# Patient Record
Sex: Female | Born: 1960
Health system: Southern US, Community
[De-identification: ages and names within clinical notes are randomized; demographics above are authoritative.]

## PROBLEM LIST (undated history)

## (undated) DIAGNOSIS — D509 Iron deficiency anemia, unspecified: Secondary | ICD-10-CM

## (undated) DIAGNOSIS — E785 Hyperlipidemia, unspecified: Secondary | ICD-10-CM

## (undated) DIAGNOSIS — R002 Palpitations: Secondary | ICD-10-CM

## (undated) DIAGNOSIS — C801 Malignant (primary) neoplasm, unspecified: Secondary | ICD-10-CM

## (undated) DIAGNOSIS — K649 Unspecified hemorrhoids: Secondary | ICD-10-CM

## (undated) DIAGNOSIS — N200 Calculus of kidney: Secondary | ICD-10-CM

## (undated) DIAGNOSIS — E119 Type 2 diabetes mellitus without complications: Secondary | ICD-10-CM

## (undated) DIAGNOSIS — K219 Gastro-esophageal reflux disease without esophagitis: Secondary | ICD-10-CM

## (undated) HISTORY — DX: Gastro-esophageal reflux disease without esophagitis: K21.9

## (undated) HISTORY — PX: PARTIAL HYSTERECTOMY: SHX80

## (undated) HISTORY — PX: KIDNEY SURGERY: SHX687

## (undated) HISTORY — PX: BREAST REDUCTION SURGERY: SHX8

## (undated) HISTORY — PX: APPENDECTOMY: SHX54

## (undated) HISTORY — DX: Iron deficiency anemia, unspecified: D50.9

## (undated) HISTORY — PX: CATARACT EXTRACTION, BILATERAL: SHX1313

## (undated) HISTORY — PX: TONSILLECTOMY: SUR1361

## (undated) HISTORY — DX: Hyperlipidemia, unspecified: E78.5

## (undated) HISTORY — DX: Type 2 diabetes mellitus without complications: E11.9

## (undated) HISTORY — DX: Palpitations: R00.2

## (undated) HISTORY — DX: Calculus of kidney: N20.0

## (undated) NOTE — *Deleted (*Deleted)
[facility]   Telephone:([phone] Fax:(336) [phone]   Clinic Follow up Note   Patient Care Team: Rodrigo Ran, MD as PCP - General (Internal Medicine) Pricilla Riffle, MD as PCP - Cardiology (Cardiology) Exie Parody, MD (Hematology and Oncology) Heilingoetter, Johnette Abraham, PA-C as Physician Assistant (Oncology) Malachy Mood, MD as Consulting Physician (Oncology) Radonna Ricker, RN as Oncology Nurse Navigator  Date of Service:  03/21/2020  CHIEF COMPLAINT: F/u ofSquamous cell carcinoma of the anus  SUMMARY OF ONCOLOGIC HISTORY: Oncology History Overview Note  Cancer Staging No matching staging information was found for the patient.    Squamous cell cancer, anus (HCC)  07/13/2019 Procedure    EUA, Excision of posterior internal/external hemorrhoid under the care of Dr. Daphine Deutscher    07/13/2019 Pathology Results   - Invasive moderately differentiated squamous cell carcinoma, 1.4 cm.  See comment  - Carcinoma invades for depth of 0.4 cm  - Deep resection margin is negative for carcinoma (0.2 cm)  - Lateral mucosal margin is positive for high-grade dysplasia  - No evidence of lymphovascular perineural invasion  Procedure: Local excision  Tumor Site: Anal canal  Tumor Size: 1.4 cm  Histologic Type: Invasive squamous cell carcinoma  Histologic Grade: G2: Moderately differentiated  Tumor Extension: Carcinoma invades superficial anal sphincter muscle  Margins: Uninvolved by tumor  Treatment Effect: N/A  Regional Lymph Nodes: No lymph nodes submitted or found  Pathologic Stage Classification (pTNM, AJCC 8th Edition):  pT1, pNx  Representative Tumor Block: A1  Comment(s): Lateral mucosal margin is positive for high-grade squamous  dysplasia      07/13/2019 Initial Diagnosis   Squamous cell cancer, anus (HCC)   09/04/2019 Imaging   CT scan Chest, Abdomen, and Pelvis   IMPRESSION: 1. No evidence of metastatic disease in the chest, abdomen or pelvis. 2. No  discrete anorectal mass. 3. Chronic findings include: Punctate nonobstructing upper right renal stones and chronic right renal scarring. 4. Aortic Atherosclerosis (ICD10-I70.0).   01/10/2020 Pathology Results   A. ANAL LESION, POSTERIOR MIDLINE, EXCISION:  - Squamous cell carcinoma, moderately differentiated.  Verbally reported by Dr. Luisa Hart 1.5 cm, negative margins, depth <1 mm   01/11/2020 Procedure   1. Excision of anal canal lesion (posterior midline) under the care of Dr. Cliffton Asters     01/28/2020 PET scan   IMPRESSION: 1. Mild focal anal hypermetabolism without discrete mass correlate on the CT images, nonspecific, differential includes postsurgical change or residual anal tumor. 2. No hypermetabolic locoregional or distant metastatic disease. 3. Chronic findings include: Aortic Atherosclerosis (ICD10-I70.0). Diffuse hepatic steatosis. Nonobstructing right nephrolithiasis.     02/04/2020 - 03/13/2020 Radiation Therapy   concurrent chemoRT by Dr Mitzi Hansen with Mitomycin and 5FU starting 02/04/20-03/13/20   02/04/2020 - 03/03/2020 Chemotherapy   Concurrent chemoRT with Mitomycin and 5FU on week 1 and 5 starting 02/04/20-03/03/20      CURRENT THERAPY:  ***  INTERVAL HISTORY: *** Melinda [name] is here for a follow up. She presents to the clinic alone.    REVIEW OF SYSTEMS:  *** Constitutional: Denies fevers, chills or abnormal weight loss Eyes: Denies blurriness of vision Ears, nose, mouth, throat, and face: Denies mucositis or sore throat Respiratory: Denies cough, dyspnea or wheezes Cardiovascular: Denies palpitation, chest discomfort or lower extremity swelling Gastrointestinal:  Denies nausea, heartburn or change in bowel habits Skin: Denies abnormal skin rashes Lymphatics: Denies new lymphadenopathy or easy bruising Neurological:Denies numbness, tingling or new weaknesses Behavioral/Psych: Mood is stable, no new changes  All other systems were reviewed with the patient and  are negative.  MEDICAL HISTORY:  Past Medical History:  Diagnosis Date  . Diabetes mellitus type 2, controlled (HCC)   . Dyslipidemia    untreated  . GERD (gastroesophageal reflux disease)   . Hemorrhoids   . Microcytic anemia 06/22/2012  . Nephrolith   . Palpitation    positive tilt table test. Prozac and Inderal treatment effective.    SURGICAL HISTORY: Past Surgical History:  Procedure Laterality Date  . APPENDECTOMY    . BREAST REDUCTION SURGERY    . CARDIAC CATHETERIZATION  2002   normal LV function and no significant coronary obstruction  . CATARACT EXTRACTION, BILATERAL Bilateral november 2020 and dcember 2020  . CYST EXCISION N/A 01/11/2020   Procedure: EXCISION OF ANAL CANAL MASS;  Surgeon: Andria Meuse, MD;  Location: [facility];  Service: General;  Laterality: N/A;  . ESOPHAGEAL MANOMETRY N/A 04/04/2017   Procedure: ESOPHAGEAL MANOMETRY (EM);  Surgeon: Carman Ching, MD;  Location: [facility];  Service: Endoscopy;  Laterality: N/A;  . HEMORRHOID SURGERY N/A 07/13/2019   Procedure: HEMORRHOIDECTOMY;  Surgeon: Luretha Murphy, MD;  Location: [facility];  Service: General;  Laterality: N/A;  . KIDNEY SURGERY     kidney stones   . PARTIAL HYSTERECTOMY    . RECTAL EXAM UNDER ANESTHESIA N/A 01/11/2020   Procedure: RECTAL EXAM UNDER ANESTHESIA;  Surgeon: Andria Meuse, MD;  Location: [facility];  Service: General;  Laterality: N/A;  . TONSILLECTOMY      I have reviewed the social history and family history with the patient and they are unchanged from previous note.  ALLERGIES:  is allergic to cephalexin, amoxapine and related, amoxicillin-pot clavulanate, atorvastatin, cefaclor, ciprofloxacin, codeine, doxycycline calcium, fenofibrate micronized, fish oil, flagyl [metronidazole hcl], levofloxacin in d5w, metronidazole, ofloxacin, prednisolone, promethazine hcl, tape, tetracycline hcl, erythromycin, and nitrofurantoin.  MEDICATIONS:  Current Outpatient  Medications  Medication Sig Dispense Refill  . clorazepate (TRANXENE) 7.5 MG tablet Take 7.5 mg by mouth 2 (two) times daily.  1  . CRESTOR 5 MG tablet Take 5 mg by mouth every evening.   1  . diphenoxylate-atropine (LOMOTIL) 2.5-0.025 MG tablet Take 1-2 tablets by mouth 4 (four) times daily as needed for diarrhea or loose stools. (Patient not taking: Reported on 02/25/2020) 60 tablet 1  . esomeprazole (NEXIUM) 40 MG capsule Take 40 mg by mouth daily at 12 noon.    . feeding supplement (ENSURE ENLIVE / ENSURE PLUS) LIQD Take 237 mLs by mouth daily. 237 mL 12  . FLUoxetine (PROZAC) 20 MG capsule Take 20 mg by mouth every morning.    . hyoscyamine (ANASPAZ) 0.125 MG TBDP disintergrating tablet Place 1 tablet (0.125 mg total) under the tongue every 6 (six) hours as needed. (Patient not taking: Reported on 03/13/2020) 30 tablet 2  . Loperamide HCl (IMODIUM PO) Take 2 capsules by mouth every 6 (six) hours as needed (loose stool).     . metFORMIN (GLUCOPHAGE) 500 MG tablet Take 500 mg by mouth 2 (two) times daily with a meal.     . methenamine (HIPREX) 1 g tablet Take 1 g by mouth daily.     [name] NU-IRON 150 MG capsule Take 150 mg by mouth daily.   2  . ondansetron (ZOFRAN) 8 MG tablet Take 1 tablet (8 mg total) by mouth 2 (two) times daily as needed (Nausea or vomiting). (Patient taking differently: Take 8 mg by mouth 2 (two) times daily as needed for  nausea or vomiting. ) 30 tablet 1  . oxyCODONE (OXY IR/ROXICODONE) 5 MG immediate release tablet Take 1 tablet (5 mg total) by mouth every 4 (four) hours as needed for severe pain. 30 tablet 0  . prochlorperazine (COMPAZINE) 10 MG tablet Take 1 tablet (10 mg total) by mouth every 6 (six) hours as needed (Nausea or vomiting). (Patient taking differently: Take 10 mg by mouth every 6 (six) hours as needed for nausea. ) 30 tablet 1  . propranolol (INDERAL) 10 MG tablet TAKE 1 TABLET BY MOUTH TWICE A DAY (Patient taking differently: Take 10 mg by mouth 2 (two) times  daily. ) 180 tablet 2  . protein supplement (RESOURCE BENEPROTEIN) 6 g POWD Take 1 Scoop (6 g total) by mouth 3 (three) times daily with meals. 50 packet 0  . Pumpkin Seed-Soy Germ (AZO BLADDER CONTROL/GO-LESS PO) Take 1 tablet by mouth daily as needed (pain & irritation).    . silver sulfADIAZINE (SILVADENE) 1 % cream Apply 1 application topically daily. 400 g 0   No current facility-administered medications for this visit.    PHYSICAL EXAMINATION: ECOG PERFORMANCE STATUS: {CHL ONC ECOG PS:[id number]}  There were no vitals filed for this visit. There were no vitals filed for this visit. *** GENERAL:alert, no distress and comfortable SKIN: skin color, texture, turgor are normal, no rashes or significant lesions EYES: normal, Conjunctiva are pink and non-injected, sclera clear {OROPHARYNX:no exudate, no erythema and lips, buccal mucosa, and tongue normal}  NECK: supple, thyroid normal size, non-tender, without nodularity LYMPH:  no palpable lymphadenopathy in the cervical, axillary {or inguinal} LUNGS: clear to auscultation and percussion with normal breathing effort HEART: regular rate & rhythm and no murmurs and no lower extremity edema ABDOMEN:abdomen soft, non-tender and normal bowel sounds Musculoskeletal:no cyanosis of digits and no clubbing  NEURO: alert & oriented x 3 with fluent speech, no focal motor/sensory deficits  LABORATORY DATA:  I have reviewed the data as listed CBC Latest Ref Rng & Units 03/20/2020 03/19/2020 03/18/2020  WBC 4.0 - 10.5 K/uL 2.3(L) 2.3(L) 1.9(L)  Hemoglobin 12.0 - 15.0 g/dL 10.0(L) 10.5(L) 10.6(L)  Hematocrit 36 - 46 % 31.7(L) 33.5(L) 33.7(L)  Platelets 150 - 400 K/uL 136(L) 130(L) 123(L)     CMP Latest Ref Rng & Units 03/20/2020 03/19/2020 03/18/2020  Glucose 70 - 99 mg/dL 98 93 161(W)  BUN 8 - 23 mg/dL 5(L) <9(U) 6(L)  Creatinine 0.44 - 1.00 mg/dL 0.45 4.09 8.11  Sodium 135 - 145 mmol/L 137 135 140  Potassium 3.5 - 5.1 mmol/L 3.8 4.2 3.3(L)   Chloride 98 - 111 mmol/L 108 106 108  CO2 22 - 32 mmol/L 22 23 21(L)  Calcium 8.9 - 10.3 mg/dL 8.3(L) 8.4(L) 8.5(L)  Total Protein 6.5 - 8.1 g/dL - 5.5(L) 5.5(L)  Total Bilirubin 0.3 - 1.2 mg/dL - 0.3 0.5  Alkaline Phos 38 - 126 U/L - 43 41  AST 15 - 41 U/L - 11(L) 12(L)  ALT 0 - 44 U/L - 11 13      RADIOGRAPHIC STUDIES: I have personally reviewed the radiological images as listed and agreed with the findings in the report. No results found.   ASSESSMENT & PLAN:  Melinda [name] is a 59 y.o. female with    1.Recurrent squamous cell carcinoma the anus, pT1N0M0 -She initially presented to Dr Loraine Maple hemorrhoidectomyand [facility] 2/5/21withinvasive moderately differentiated squamous cell carcinoma, 1.4 cm,pT1, pNx. -She had local recurrence andunderwent surgical excision under the care of Dr. Cliffton Asters on 01/11/20. Surgical pathwas  consistent with squamous cell carcinoma of the anus.01/28/11 staging PETshowed nonode ordistant metastasis. -She was able to complete 5/6 weeks of the standard concurrent chemoRT with Mitomycin and5FU on due to poor tolerance of local irritation, infection, N/V/D. Due to failure to thrive at home and in outpatient I admitted her to Hospital on 03/13/20.  ***   2. Diarrhea, Nausea,Radiation dermatitiswith pain, Enteritis/colitis and UTI, radiation-induced  -She is able to tolerate oral intake adequately, continue follow-up with dietitian -S/p week 5 treatment her skin irration worsened with severe dysuria and significant skin itching. On 03/10/20 exam she has no true breakdown, but mild discharge. She has internal hemorrhoids which contributes to her BM pain.  -She notes tramadol did not help. I started her on oxycodone 5mg  (03/10/20) -03/10/20 Urine culture showed E. coli, pansensitive. She tried treatment with Macrobid, but she was not able to tolerate it due to nausea. She was hospitalized on 03/13/20 and treated with IV antibiotics as inpatient.    3.Iron deficiency anemia -Etiology unclear as to her iron deficiency. S/Physterectomy -Currently managed by PCP, Dr. Waynard Edwards. She receives iron infusions with Feraheme PRN, last in9/3/21 -Mild, stable.  4. Diabetes Mellitis  -Continue to f/u with PCP for management.  5. Neutropenia, Thrombocytopenia -Secondary to chemo, overall mild   6. Hyponatremia -Potassium 3.0 on 03/13/20 , will give IV 20 mEq today   PLAN: ***     No problem-specific Assessment & Plan notes found for this encounter.   No orders of the defined types were placed in this encounter.  All questions were answered. The patient knows to call the clinic with any problems, questions or concerns. No barriers to learning was detected. The total time spent in the appointment was {CHL ONC TIME VISIT - RUEAV:[id number]}.     Delphina Cahill 03/21/2020   Rogelia Rohrer, am acting as scribe for Malachy Mood, MD.   {Add scribe attestation statement}

## (undated) NOTE — *Deleted (*Deleted)
[facility]   Telephone:(336) [phone] Fax:(336) [phone]   Clinic Follow up Note   Patient Care Team: Rodrigo Ran, MD as PCP - General (Internal Medicine) Pricilla Riffle, MD as PCP - Cardiology (Cardiology) Exie Parody, MD (Hematology and Oncology) Heilingoetter, Johnette Abraham, PA-C as Physician Assistant (Oncology) Malachy Mood, MD as Consulting Physician (Oncology) Radonna Ricker, RN as Oncology Nurse Navigator  Date of Service:  04/04/2020  CHIEF COMPLAINT: ***  SUMMARY OF ONCOLOGIC HISTORY: Oncology History Overview Note  Cancer Staging No matching staging information was found for the patient.    Squamous cell cancer, anus (HCC)  07/13/2019 Procedure    EUA, Excision of posterior internal/external hemorrhoid under the care of Dr. Daphine Deutscher    07/13/2019 Pathology Results   - Invasive moderately differentiated squamous cell carcinoma, 1.4 cm.  See comment  - Carcinoma invades for depth of 0.4 cm  - Deep resection margin is negative for carcinoma (0.2 cm)  - Lateral mucosal margin is positive for high-grade dysplasia  - No evidence of lymphovascular perineural invasion  Procedure: Local excision  Tumor Site: Anal canal  Tumor Size: 1.4 cm  Histologic Type: Invasive squamous cell carcinoma  Histologic Grade: G2: Moderately differentiated  Tumor Extension: Carcinoma invades superficial anal sphincter muscle  Margins: Uninvolved by tumor  Treatment Effect: N/A  Regional Lymph Nodes: No lymph nodes submitted or found  Pathologic Stage Classification (pTNM, AJCC 8th Edition):  pT1, pNx  Representative Tumor Block: A1  Comment(s): Lateral mucosal margin is positive for high-grade squamous  dysplasia      07/13/2019 Initial Diagnosis   Squamous cell cancer, anus (HCC)   09/04/2019 Imaging   CT scan Chest, Abdomen, and Pelvis   IMPRESSION: 1. No evidence of metastatic disease in the chest, abdomen or pelvis. 2. No discrete anorectal mass. 3. Chronic findings  include: Punctate nonobstructing upper right renal stones and chronic right renal scarring. 4. Aortic Atherosclerosis (ICD10-I70.0).   01/10/2020 Pathology Results   A. ANAL LESION, POSTERIOR MIDLINE, EXCISION:  - Squamous cell carcinoma, moderately differentiated.  Verbally reported by Dr. Luisa Hart 1.5 cm, negative margins, depth <1 mm   01/11/2020 Procedure   1. Excision of anal canal lesion (posterior midline) under the care of Dr. Cliffton Asters     01/28/2020 PET scan   IMPRESSION: 1. Mild focal anal hypermetabolism without discrete mass correlate on the CT images, nonspecific, differential includes postsurgical change or residual anal tumor. 2. No hypermetabolic locoregional or distant metastatic disease. 3. Chronic findings include: Aortic Atherosclerosis (ICD10-I70.0). Diffuse hepatic steatosis. Nonobstructing right nephrolithiasis.     02/04/2020 - 03/07/2020 Radiation Therapy   concurrent chemoRT by Dr Mitzi Hansen with Mitomycin and 5FU starting 02/04/20-03/07/20. The last 4 session cancelled due to poor toleration.    02/04/2020 - 03/03/2020 Chemotherapy   Concurrent chemoRT with Mitomycin and 5FU on week 1 and 5 starting 02/04/20-03/03/20   03/13/2020 Imaging   CT AP W contrast  IMPRESSION: Diffuse wall thickening with inflammatory changes involving a long segment of distal ileum, cecum, and sigmoid colon likely represents enteritis/colitis. This could be due to infectious, or inflammatory nature, not thought to represent ischemic colitis.   Hepatic steatosis   Trace pericardial effusion   Small free fluid in the deep pelvis      CURRENT THERAPY:  ***  INTERVAL HISTORY: *** Melinda [name] is here for a follow up ***  REVIEW OF SYSTEMS:  *** Constitutional: Denies fevers, chills or abnormal weight loss Eyes: Denies blurriness of vision  Ears, nose, mouth, throat, and face: Denies mucositis or sore throat Respiratory: Denies cough, dyspnea or wheezes Cardiovascular: Denies  palpitation, chest discomfort or lower extremity swelling Gastrointestinal:  Denies nausea, heartburn or change in bowel habits Skin: Denies abnormal skin rashes Lymphatics: Denies new lymphadenopathy or easy bruising Neurological:Denies numbness, tingling or new weaknesses Behavioral/Psych: Mood is stable, no new changes  All other systems were reviewed with the patient and are negative.  MEDICAL HISTORY:  Past Medical History:  Diagnosis Date  . anal ca dx'd 07/2019  . Diabetes mellitus type 2, controlled (HCC)   . Dyslipidemia    untreated  . GERD (gastroesophageal reflux disease)   . Hemorrhoids   . Microcytic anemia 06/22/2012  . Nephrolith   . Palpitation    positive tilt table test. Prozac and Inderal treatment effective.    SURGICAL HISTORY: Past Surgical History:  Procedure Laterality Date  . APPENDECTOMY    . BREAST REDUCTION SURGERY    . CARDIAC CATHETERIZATION  2002   normal LV function and no significant coronary obstruction  . CATARACT EXTRACTION, BILATERAL Bilateral november 2020 and dcember 2020  . CYST EXCISION N/A 01/11/2020   Procedure: EXCISION OF ANAL CANAL MASS;  Surgeon: Andria Meuse, MD;  Location: [facility];  Service: General;  Laterality: N/A;  . CYSTOSCOPY/URETEROSCOPY/HOLMIUM LASER/STENT PLACEMENT Right 03/26/2020   Procedure: CYSTOSCOPY/URETEROSCOPY/HOLMIUM LASER/STENT PLACEMENT;  Surgeon: Crist Fat, MD;  Location: [facility];  Service: Urology;  Laterality: Right;  . ESOPHAGEAL MANOMETRY N/A 04/04/2017   Procedure: ESOPHAGEAL MANOMETRY (EM);  Surgeon: Carman Ching, MD;  Location: [facility];  Service: Endoscopy;  Laterality: N/A;  . HEMORRHOID SURGERY N/A 07/13/2019   Procedure: HEMORRHOIDECTOMY;  Surgeon: Luretha Murphy, MD;  Location: [facility];  Service: General;  Laterality: N/A;  . KIDNEY SURGERY     kidney stones   . PARTIAL HYSTERECTOMY    . RECTAL EXAM UNDER ANESTHESIA N/A 01/11/2020   Procedure: RECTAL EXAM  UNDER ANESTHESIA;  Surgeon: Andria Meuse, MD;  Location: [facility];  Service: General;  Laterality: N/A;  . TONSILLECTOMY      I have reviewed the social history and family history with the patient and they are unchanged from previous note.  ALLERGIES:  is allergic to cephalexin, amoxapine and related, amoxicillin-pot clavulanate, atorvastatin, cefaclor, ciprofloxacin, codeine, doxycycline calcium, fenofibrate micronized, fish oil, flagyl [metronidazole hcl], levofloxacin in d5w, metronidazole, ofloxacin, prednisolone, promethazine hcl, tape, tetracycline hcl, erythromycin, and nitrofurantoin.  MEDICATIONS:  No current facility-administered medications for this visit.   No current outpatient medications on file.   Facility-Administered Medications Ordered in Other Visits  Medication Dose Route Frequency Provider Last Rate Last Admin  . 0.9 %  sodium chloride infusion   Intravenous Continuous Marikay Alar, FNP 50 mL/hr at 04/04/20 1610 Rate Verify at 04/04/20 0639  . acetaminophen (TYLENOL) tablet 650 mg  650 mg Oral Q6H PRN Lewie Chamber, MD   650 mg at 04/02/20 2122   Or  . acetaminophen (TYLENOL) suppository 650 mg  650 mg Rectal Q6H PRN Lewie Chamber, MD      . clorazepate (TRANXENE) tablet 7.5 mg  7.5 mg Oral BID Lewie Chamber, MD   7.5 mg at 04/04/20 0933  . FLUoxetine (PROZAC) capsule 20 mg  20 mg Oral q morning - 10a Lewie Chamber, MD   20 mg at 04/04/20 0934  . insulin aspart (novoLOG) injection 0-20 Units  0-20 Units Subcutaneous Q4H Lewie Chamber, MD   4 Units at 04/04/20 0935  . methylPREDNISolone  sodium succinate (SOLU-MEDROL) 125 mg/2 mL injection 60 mg  60 mg Intravenous Eliezer Lofts, MD   60 mg at 04/04/20 0533  . morphine 2 MG/ML injection 2 mg  2 mg Intravenous Q2H PRN Lewie Chamber, MD      . ondansetron [facility]) tablet 4 mg  4 mg Oral Q6H PRN Lewie Chamber, MD       Or  . ondansetron [facility]) injection 4 mg  4 mg Intravenous Q6H PRN Lewie Chamber, MD       . phenazopyridine (PYRIDIUM) tablet 100 mg  100 mg Oral TID PRN Lewie Chamber, MD   100 mg at 04/03/20 2112  . propranolol (INDERAL) tablet 10 mg  10 mg Oral BID Lewie Chamber, MD   10 mg at 04/04/20 0934  . rosuvastatin (CRESTOR) tablet 5 mg  5 mg Oral Daily Marikay Alar, FNP   5 mg at 04/03/20 2247  . sodium chloride flush (NS) 0.9 % injection 3 mL  3 mL Intravenous Orlene Erm, MD   3 mL at 04/03/20 2112    PHYSICAL EXAMINATION: ECOG PERFORMANCE STATUS: {CHL ONC ECOG PS:[id number]}  There were no vitals filed for this visit. There were no vitals filed for this visit. *** GENERAL:alert, no distress and comfortable SKIN: skin color, texture, turgor are normal, no rashes or significant lesions EYES: normal, Conjunctiva are pink and non-injected, sclera clear {OROPHARYNX:no exudate, no erythema and lips, buccal mucosa, and tongue normal}  NECK: supple, thyroid normal size, non-tender, without nodularity LYMPH:  no palpable lymphadenopathy in the cervical, axillary {or inguinal} LUNGS: clear to auscultation and percussion with normal breathing effort HEART: regular rate & rhythm and no murmurs and no lower extremity edema ABDOMEN:abdomen soft, non-tender and normal bowel sounds Musculoskeletal:no cyanosis of digits and no clubbing  NEURO: alert & oriented x 3 with fluent speech, no focal motor/sensory deficits  LABORATORY DATA:  I have reviewed the data as listed CBC Latest Ref Rng & Units 04/04/2020 04/03/2020 04/02/2020  WBC 4.0 - 10.5 K/uL 3.8(L) 2.9(L) 6.6  Hemoglobin 12.0 - 15.0 g/dL 10.1(L) 10.0(L) 12.7  Hematocrit 36 - 46 % 31.4(L) 31.4(L) 38.6  Platelets 150 - 400 K/uL 170 154 205     CMP Latest Ref Rng & Units 04/04/2020 04/03/2020 04/02/2020  Glucose 70 - 99 mg/dL 865(H) 846(N) 629(B)  BUN 8 - 23 mg/dL 14 16 28(U)  Creatinine 0.44 - 1.00 mg/dL 1.32 4.40 1.02  Sodium 135 - 145 mmol/L 137 137 139  Potassium 3.5 - 5.1 mmol/L 3.7 3.7 3.1(L)  Chloride 98 -  111 mmol/L 106 105 101  CO2 22 - 32 mmol/L 23 24 25   Calcium 8.9 - 10.3 mg/dL 7.2(Z) 3.6(U) 9.1  Total Protein 6.5 - 8.1 g/dL - - -  Total Bilirubin 0.3 - 1.2 mg/dL - - -  Alkaline Phos 38 - 126 U/L - - -  AST 15 - 41 U/L - - -  ALT 0 - 44 U/L - - -      RADIOGRAPHIC STUDIES: I have personally reviewed the radiological images as listed and agreed with the findings in the report. CT ABDOMEN PELVIS W CONTRAST  Result Date: 04/02/2020 CLINICAL DATA:  Lower abdominal pain for almost 2 months with worsening since last night. Nausea, vomiting and diarrhea. 25 pound weight loss. History of anal cancer status post radiation therapy in February 2021 with completion of chemotherapy 03/07/2020. EXAM: CT ABDOMEN AND PELVIS WITH CONTRAST TECHNIQUE: Multidetector CT imaging of the abdomen and pelvis was performed  using the standard protocol following bolus administration of intravenous contrast. CONTRAST:  OMNIPAQUE IOHEXOL 300 MG/ML  SOLN COMPARISON:  03/28/2020 CT abdomen/pelvis. FINDINGS: Lower chest: Posterior right lower lobe 3 mm solid pulmonary nodule (series 6/image 11), unchanged from 03/28/2020 CT abdomen study. Hepatobiliary: Normal liver size. No liver mass. Normal gallbladder with no radiopaque cholelithiasis. No biliary ductal dilatation. Pancreas: Normal, with no mass or duct dilation. Spleen: Normal size. No mass. Adrenals/Urinary Tract: Normal adrenals. Moderate right hydroureteronephrosis to the level of distal right pelvic ureter, similar to slightly worsened since 03/28/2020 CT. Urothelial wall thickening and hyperenhancement in the distal 2-3 cm of the right pelvic ureter, similar. No left hydronephrosis. Normal caliber left ureter. Moderate renal cortical scarring throughout the upper right kidney is unchanged. Subcentimeter hypodense renal cortical lesion in the anterior interpolar right kidney is too small to characterize and unchanged. No new renal lesions. Tiny focus of gas in the  nondependent bladder. No definite bladder wall thickening. Small calcification in the anterior midline bladder wall is unchanged. Stomach/Bowel: Small hiatal hernia. Otherwise normal nondistended stomach. Long segment of moderate contiguous wall thickening in the distal and terminal ileum with associated mucosal hyperenhancement and surrounding mesenteric fat stranding and ill-defined fluid, similar to slightly improved in the interval. No new sites of small bowel wall thickening. No small bowel dilatation. Appendectomy. No definite large bowel wall thickening. No colonic diverticulosis. No acute pericolonic fat stranding. Large bowel is largely collapsed. Vascular/Lymphatic: Atherosclerotic nonaneurysmal abdominal aorta. Patent portal, splenic, hepatic and renal veins. Retroaortic left renal vein. No pathologically enlarged lymph nodes in the abdomen or pelvis. Reproductive: Status post hysterectomy, with no mass at the vaginal cuff. No adnexal mass. Other: No pneumoperitoneum. No focal fluid collections. Small volume free fluid in the pelvic cul-de-sac is similar. Musculoskeletal: No aggressive appearing focal osseous lesions. Moderate thoracolumbar spondylosis. IMPRESSION: 1. CT study is fairly similar to the CT study performed 5 days prior. 2. Nonspecific distal and terminal ileitis, potentially treatment related, similar to slightly improved. No bowel obstruction. No free air. 3. Moderate right hydroureteronephrosis to the level of the distal right pelvic ureter, similar to slightly worsened. Nonspecific urothelial wall thickening and hyperenhancement in the distal 2-3 cm of the right pelvic ureter, similar, probably inflammatory. Consider urology consultation. 4. Tiny focus of gas in the nondependent bladder, presumably due to recent bladder instrumentation. Correlate with urinalysis as clinically warranted to exclude acute cystitis. 5. Stable small volume free fluid in the pelvic cul-de-sac. 6. No  lymphadenopathy or other findings suspicious for metastatic disease in the abdomen or pelvis. Right lung base 3 mm solid pulmonary nodule is stable and warrants continued chest CT follow-up. 7. Small hiatal hernia. 8. Aortic Atherosclerosis (ICD10-I70.0). Electronically Signed   By: Delbert Phenix M.D.   On: 04/02/2020 17:21     ASSESSMENT & PLAN:  RYLANN MUNFORD is a 1 y.o. female with      No problem-specific Assessment & Plan notes found for this encounter.   No orders of the defined types were placed in this encounter.  All questions were answered. The patient knows to call the clinic with any problems, questions or concerns. No barriers to learning was detected. The total time spent in the appointment was {CHL ONC TIME VISIT - ZOXWR:[id number]}.     Melinda [name] 04/04/2020   Rogelia Rohrer, am acting as scribe for Malachy Mood, MD.   {Add scribe attestation statement}

---

## 1998-01-07 ENCOUNTER — Inpatient Hospital Stay (HOSPITAL_COMMUNITY): Admission: AD | Admit: 1998-01-07 | Discharge: 1998-01-07 | Payer: Self-pay | Admitting: Family Medicine

## 1998-01-11 ENCOUNTER — Ambulatory Visit (HOSPITAL_COMMUNITY): Admission: RE | Admit: 1998-01-11 | Discharge: 1998-01-11 | Payer: Self-pay | Admitting: *Deleted

## 1998-01-11 ENCOUNTER — Inpatient Hospital Stay (HOSPITAL_COMMUNITY): Admission: AD | Admit: 1998-01-11 | Discharge: 1998-01-11 | Payer: Self-pay | Admitting: *Deleted

## 1998-01-12 ENCOUNTER — Inpatient Hospital Stay (HOSPITAL_COMMUNITY): Admission: AD | Admit: 1998-01-12 | Discharge: 1998-01-12 | Payer: Self-pay | Admitting: Obstetrics and Gynecology

## 1998-01-15 ENCOUNTER — Ambulatory Visit (HOSPITAL_COMMUNITY): Admission: RE | Admit: 1998-01-15 | Discharge: 1998-01-15 | Payer: Self-pay | Admitting: Obstetrics and Gynecology

## 1998-01-20 ENCOUNTER — Ambulatory Visit (HOSPITAL_COMMUNITY): Admission: RE | Admit: 1998-01-20 | Discharge: 1998-01-20 | Payer: Self-pay

## 1998-02-07 ENCOUNTER — Encounter: Payer: Self-pay | Admitting: Emergency Medicine

## 1998-02-07 ENCOUNTER — Observation Stay (HOSPITAL_COMMUNITY): Admission: EM | Admit: 1998-02-07 | Discharge: 1998-02-08 | Payer: Self-pay | Admitting: Emergency Medicine

## 1998-07-24 ENCOUNTER — Inpatient Hospital Stay (HOSPITAL_COMMUNITY): Admission: RE | Admit: 1998-07-24 | Discharge: 1998-07-25 | Payer: Self-pay | Admitting: Gynecology

## 1998-08-13 ENCOUNTER — Observation Stay (HOSPITAL_COMMUNITY): Admission: AD | Admit: 1998-08-13 | Discharge: 1998-08-14 | Payer: Self-pay | Admitting: Obstetrics and Gynecology

## 1998-08-18 ENCOUNTER — Emergency Department (HOSPITAL_COMMUNITY): Admission: EM | Admit: 1998-08-18 | Discharge: 1998-08-18 | Payer: Self-pay | Admitting: Emergency Medicine

## 1998-12-02 ENCOUNTER — Emergency Department (HOSPITAL_COMMUNITY): Admission: EM | Admit: 1998-12-02 | Discharge: 1998-12-02 | Payer: Self-pay

## 1999-01-30 ENCOUNTER — Encounter: Payer: Self-pay | Admitting: Family Medicine

## 1999-01-30 ENCOUNTER — Ambulatory Visit (HOSPITAL_COMMUNITY): Admission: RE | Admit: 1999-01-30 | Discharge: 1999-01-30 | Payer: Self-pay | Admitting: Family Medicine

## 1999-10-15 ENCOUNTER — Encounter (INDEPENDENT_AMBULATORY_CARE_PROVIDER_SITE_OTHER): Payer: Self-pay | Admitting: Specialist

## 1999-10-16 ENCOUNTER — Encounter: Payer: Self-pay | Admitting: General Surgery

## 1999-10-16 ENCOUNTER — Inpatient Hospital Stay (HOSPITAL_COMMUNITY): Admission: EM | Admit: 1999-10-16 | Discharge: 1999-10-18 | Payer: Self-pay | Admitting: Emergency Medicine

## 1999-10-16 ENCOUNTER — Encounter: Payer: Self-pay | Admitting: Emergency Medicine

## 1999-10-29 ENCOUNTER — Other Ambulatory Visit: Admission: RE | Admit: 1999-10-29 | Discharge: 1999-10-29 | Payer: Self-pay | Admitting: Gynecology

## 2000-03-20 ENCOUNTER — Encounter: Payer: Self-pay | Admitting: Emergency Medicine

## 2000-03-20 ENCOUNTER — Emergency Department (HOSPITAL_COMMUNITY): Admission: EM | Admit: 2000-03-20 | Discharge: 2000-03-20 | Payer: Self-pay | Admitting: Emergency Medicine

## 2000-06-07 HISTORY — PX: CARDIAC CATHETERIZATION: SHX172

## 2000-07-18 ENCOUNTER — Ambulatory Visit (HOSPITAL_COMMUNITY): Admission: RE | Admit: 2000-07-18 | Discharge: 2000-07-18 | Payer: Self-pay | Admitting: Family Medicine

## 2000-07-18 ENCOUNTER — Encounter: Payer: Self-pay | Admitting: Family Medicine

## 2000-08-29 ENCOUNTER — Ambulatory Visit (HOSPITAL_COMMUNITY): Admission: RE | Admit: 2000-08-29 | Discharge: 2000-08-29 | Payer: Self-pay | Admitting: Family Medicine

## 2000-08-29 ENCOUNTER — Encounter: Payer: Self-pay | Admitting: Family Medicine

## 2001-02-24 ENCOUNTER — Encounter: Payer: Self-pay | Admitting: Cardiology

## 2001-02-24 ENCOUNTER — Ambulatory Visit (HOSPITAL_COMMUNITY): Admission: RE | Admit: 2001-02-24 | Discharge: 2001-02-24 | Payer: Self-pay | Admitting: Cardiology

## 2001-07-06 ENCOUNTER — Ambulatory Visit (HOSPITAL_COMMUNITY): Admission: RE | Admit: 2001-07-06 | Discharge: 2001-07-06 | Payer: Self-pay | Admitting: Family Medicine

## 2001-07-06 ENCOUNTER — Encounter: Payer: Self-pay | Admitting: Family Medicine

## 2002-04-05 ENCOUNTER — Encounter: Payer: Self-pay | Admitting: Family Medicine

## 2002-04-05 ENCOUNTER — Ambulatory Visit (HOSPITAL_COMMUNITY): Admission: RE | Admit: 2002-04-05 | Discharge: 2002-04-05 | Payer: Self-pay | Admitting: Family Medicine

## 2002-04-09 ENCOUNTER — Encounter: Admission: RE | Admit: 2002-04-09 | Discharge: 2002-04-09 | Payer: Self-pay | Admitting: Gastroenterology

## 2002-04-09 ENCOUNTER — Encounter: Payer: Self-pay | Admitting: Gastroenterology

## 2002-04-26 ENCOUNTER — Ambulatory Visit (HOSPITAL_COMMUNITY): Admission: RE | Admit: 2002-04-26 | Discharge: 2002-04-26 | Payer: Self-pay | Admitting: Gastroenterology

## 2002-04-26 ENCOUNTER — Encounter: Payer: Self-pay | Admitting: Gastroenterology

## 2002-05-08 ENCOUNTER — Ambulatory Visit (HOSPITAL_COMMUNITY): Admission: RE | Admit: 2002-05-08 | Discharge: 2002-05-08 | Payer: Self-pay | Admitting: Gastroenterology

## 2002-08-15 ENCOUNTER — Inpatient Hospital Stay (HOSPITAL_COMMUNITY): Admission: AD | Admit: 2002-08-15 | Discharge: 2002-08-16 | Payer: Self-pay | Admitting: *Deleted

## 2002-08-15 ENCOUNTER — Encounter: Payer: Self-pay | Admitting: *Deleted

## 2002-08-16 ENCOUNTER — Encounter: Payer: Self-pay | Admitting: *Deleted

## 2002-09-02 ENCOUNTER — Emergency Department (HOSPITAL_COMMUNITY): Admission: EM | Admit: 2002-09-02 | Discharge: 2002-09-02 | Payer: Self-pay | Admitting: Emergency Medicine

## 2002-11-07 ENCOUNTER — Ambulatory Visit (HOSPITAL_COMMUNITY): Admission: RE | Admit: 2002-11-07 | Discharge: 2002-11-07 | Payer: Self-pay | Admitting: Family Medicine

## 2002-11-07 ENCOUNTER — Encounter: Payer: Self-pay | Admitting: Family Medicine

## 2003-02-07 ENCOUNTER — Encounter: Payer: Self-pay | Admitting: Family Medicine

## 2003-02-07 ENCOUNTER — Ambulatory Visit (HOSPITAL_COMMUNITY): Admission: RE | Admit: 2003-02-07 | Discharge: 2003-02-07 | Payer: Self-pay | Admitting: Family Medicine

## 2003-04-26 ENCOUNTER — Encounter: Admission: RE | Admit: 2003-04-26 | Discharge: 2003-04-26 | Payer: Self-pay | Admitting: Family Medicine

## 2003-05-08 ENCOUNTER — Encounter: Admission: RE | Admit: 2003-05-08 | Discharge: 2003-05-08 | Payer: Self-pay | Admitting: Family Medicine

## 2003-05-08 ENCOUNTER — Encounter (INDEPENDENT_AMBULATORY_CARE_PROVIDER_SITE_OTHER): Payer: Self-pay | Admitting: Specialist

## 2003-05-15 ENCOUNTER — Ambulatory Visit (HOSPITAL_COMMUNITY): Admission: RE | Admit: 2003-05-15 | Discharge: 2003-05-15 | Payer: Self-pay | Admitting: Family Medicine

## 2003-05-23 ENCOUNTER — Ambulatory Visit (HOSPITAL_COMMUNITY): Admission: RE | Admit: 2003-05-23 | Discharge: 2003-05-23 | Payer: Self-pay | Admitting: Family Medicine

## 2003-06-17 ENCOUNTER — Emergency Department (HOSPITAL_COMMUNITY): Admission: EM | Admit: 2003-06-17 | Discharge: 2003-06-18 | Payer: Self-pay | Admitting: Emergency Medicine

## 2003-06-21 ENCOUNTER — Ambulatory Visit (HOSPITAL_COMMUNITY): Admission: RE | Admit: 2003-06-21 | Discharge: 2003-06-21 | Payer: Self-pay | Admitting: Family Medicine

## 2003-09-17 ENCOUNTER — Other Ambulatory Visit: Admission: RE | Admit: 2003-09-17 | Discharge: 2003-09-17 | Payer: Self-pay | Admitting: Gynecology

## 2004-02-27 ENCOUNTER — Ambulatory Visit (HOSPITAL_COMMUNITY): Admission: RE | Admit: 2004-02-27 | Discharge: 2004-02-27 | Payer: Self-pay | Admitting: Family Medicine

## 2004-07-09 ENCOUNTER — Ambulatory Visit: Admission: RE | Admit: 2004-07-09 | Discharge: 2004-07-09 | Payer: Self-pay | Admitting: Family Medicine

## 2005-01-28 ENCOUNTER — Other Ambulatory Visit: Admission: RE | Admit: 2005-01-28 | Discharge: 2005-01-28 | Payer: Self-pay | Admitting: Gynecology

## 2005-07-23 ENCOUNTER — Ambulatory Visit (HOSPITAL_COMMUNITY): Admission: RE | Admit: 2005-07-23 | Discharge: 2005-07-23 | Payer: Self-pay | Admitting: *Deleted

## 2005-08-26 ENCOUNTER — Ambulatory Visit: Payer: Self-pay

## 2005-09-08 ENCOUNTER — Ambulatory Visit: Payer: Self-pay | Admitting: Cardiology

## 2005-09-30 ENCOUNTER — Ambulatory Visit: Payer: Self-pay | Admitting: Cardiology

## 2006-03-17 ENCOUNTER — Other Ambulatory Visit: Admission: RE | Admit: 2006-03-17 | Discharge: 2006-03-17 | Payer: Self-pay | Admitting: Gynecology

## 2009-04-05 ENCOUNTER — Encounter: Admission: RE | Admit: 2009-04-05 | Discharge: 2009-04-05 | Payer: Self-pay | Admitting: Family Medicine

## 2010-06-27 ENCOUNTER — Encounter: Payer: Self-pay | Admitting: Family Medicine

## 2010-06-28 ENCOUNTER — Encounter: Payer: Self-pay | Admitting: Family Medicine

## 2010-10-20 NOTE — Assessment & Plan Note (Signed)
[facility]                            CARDIOLOGY OFFICE NOTE   [name], [name]                      MRN:          [id number]  DATE:06/14/2008                            DOB:          Nov 03, 1960    Melinda [name] is in for followup visit.  It has been some time since we  last saw her, specifically in April 2007.  Since I last saw her, her  mother died a couple of years ago.  She has been losing weight and, she  has quit going out, at night with her girlfriends.  She said she has  rare chest tightness, but really nothing bad.  Unfortunately, she does  continue to smoke.   MEDICATIONS:  1. Prozac 20 mg daily.  2. Klonopin 0.5 mg p.o. t.i.d.  3. Nexium 40 mg b.i.d.  4. Inderal 40 mg b.i.d.  5. Aspirin 81 mg daily.   PHYSICAL EXAMINATION:  VITAL SIGNS:  Her weight is 167 pounds, blood  pressure 124/90, and pulse is 64.  NECK:  There are no carotid bruits.  The neck is supple.  HEENT:  Grossly unremarkable.  LUNG:  Fields are clear to auscultation and percussion.  CARDIAC:  Rhythm is regular without significant murmur noted.  EXTREMITIES:  There is no obvious extremity edema.   The electrocardiogram demonstrates normal sinus rhythm essentially  within normal limits.   IMPRESSION:  1. History of recurrent chest pain.  Last cardiac catheterization in      2002 demonstrating normal left ventricular function and no      significant coronary obstruction.  2. History of dyslipidemia, untreated.  3. Continued tobacco use.  4. Hypertension, on low-dose Inderal.  5. Family history of coronary artery disease.  6. Gastroesophageal reflux.  7. History of questionable lung nodule followed by Dr. Sherene Sires, with      subsequent negative CT per report of the patient.   RECOMMENDATIONS:  1. Continued tobacco use.  Discussion, Options, and methodologies      reviewed.  2. Suggest weight loss for a drop in blood pressure.  3. Discussion regarding metabolic  abnormalities, and specifically      dysmetabolic syndrome with      need for followup with her primary care Wynston Romey and specifically      with need for discontinuation of alcohol.     Arturo Morton. [name], MD, [facility]  Electronically Signed    TDS/MedQ  DD: 06/17/2008  DT: 06/17/2008  Job #: [id number]

## 2010-10-23 NOTE — Op Note (Signed)
[facility]  Patient:    Melinda [name], Melinda [name]                 MRN: [id number] Proc. Date: 10/16/99 Adm. Date:  [id number] Disc. Date: [id number] Attending:  Carson Myrtle                           Operative Report  PREOPERATIVE DIAGNOSIS:  Acute appendicitis.  POSTOPERATIVE DIAGNOSIS:  Acute appendicitis.  PROCEDURE:  Laparoscopic appendectomy.  SURGEON:  Timothy E. Earlene Plater, M.D.  ANESTHESIA:  General.  INDICATIONS:  Ms. Maciolek was seen late last night and admitted for observation.  Was not sure she had appendicitis.  White count did drop from 17 to 12 just with IV fluids and bed rest.  She had a little bit less pain this morning, but because of ongoing findings, a CT scan was done which confirmed appendicitis. She and her husband were informed, and they wished to proceed with appendectomy.  DESCRIPTION OF PROCEDURE:  The patient was brought to the operating room and placed supine.  General endotracheal anesthesia was administered.  A Foley catheter was inserted.  The abdomen was prepped and draped.  Marcaine, 0.5%, plain was used for each trocar.  A horizontal infraumbilical incision was made.  The fascia was identified and opened vertically.  The peritoneum was entered without complication.  Hasson catheter tied in place.  Camera was inserted.  The abdomen was insufflated.  Peritoneoscopy was otherwise unremarkable except for some cloudy fluid in the pelvis, some adhesions between the inflamed appendix and the right ovary.  The appendix was long and draped over into the pelvis but was otherwise free.  Irrigation was carried out.  The cloudy fluid was removed.  A 5-mm trocar was introduced in the right upper quadrant.  A 12-mm trocar was introduced in the left lower quadrant under direct vision.  Appropriate instruments were applied.  The appendix was grasped and placed on tension.  Blunt dissection divided the mesentery, and the base of the  appendix could be crushed and crossclamped with a GIA stapling device which was then fired.  This relieved the appendix from the cecum.  Two firings of the GIA stapling device were used on the appendiceal mesentery.  It was intact.  There was no bleeding.  Copious irrigation was carried out until clear.  Inspection was negative for complications.  The appendix was removed in a bag through the left lower quadrant port site.  It was then inspected, irrigated, and closed with a single suture of #1 Vicryl.  It appeared to be airtight.  This was done under direct vision.  With this, the procedure was complete.  The counts were correct.  All CO2, irrigant, instruments, and trocars were removed.  The counts were correct the second time.  The skin incision was closed with 4-0 Monocryl.  Steri-Strips were applied.  Small dressings were applied.  She tolerated well and was removed to recovery room in good condition. DD:  10/16/99 TD:  10/20/99 Job: [id number]

## 2010-10-23 NOTE — Discharge Summary (Signed)
NAME:  Melinda [name], Melinda [name]                         ACCOUNT NO.:  [id number]   MEDICAL RECORD NO.:  [id number]                   PATIENT TYPE:  INP   LOCATION:  2016                                 FACILITY:  [facility]   PHYSICIAN:  Melinda [name], M.D. [facility]         DATE OF BIRTH:  19-Feb-1961   DATE OF ADMISSION:  08/15/2002  DATE OF DISCHARGE:  08/16/2002                           DISCHARGE SUMMARY - REFERRING   HISTORY OF PRESENT ILLNESS:  The patient is a 50 year old white female who  presented with intermittent chest discomfort.  She had a catheterization in  2002 by Dr. Arturo [name]. [name], which showed normal coronaries except for a  left main spasm with catheter insertion.  She presents with intermittent  chest discomfort similar to previous chest discomfort, thus she was  admitted.  Her history is notable for hyperlipidemia, tobacco use.   LABORATORY DATA:  Admission H&H was 14.3 and 40.8, normal indices, platelets  276,000, WBC 7.3.  PT 12.8, PTT 32.  Sodium 138, potassium 3.7,  BUN 22,  creatinine 0.8, glucose 89.  Serial 3 CK, total MBs, and troponins were  negative for myocardial infarction.  Fasting lipids showed a total  cholesterol of 242, triglycerides 876, HDL 28, LDL is not calculated.   HOSPITAL COURSE:  The patient was admitted to the unit 2000.  Overnight, she  continued to have intermittent chest discomfort but EKGs and enzymes were  negative for myocardial infarction.  Dr. Arturo [name]. [name] felt that she  should undergo stress Cardiolite.  This was performed on the 11th.  She had  reached stage III and she was unable to continue because of shortness of  breath and they had not reached her goal heart rate.  Thus, she was changed  on Adenosine.   Imaging showed an EF of 67% with breast attenuation without signs of  ischemia.  Dr. Arturo [name]. [name] felt that the patient could be discharged  home and followed up with her primary care and her lung doctor.  As noted,  Dr.  [name] is following a lung nodule.   DISCHARGE DIAGNOSES:  1. Prolonged atypical chest discomfort of undetermined etiology.  2. Negative Adenosine-Cardiolite.  3. Hyperlipidemia.  4. History as previously.   DISCHARGE MEDICATIONS:  1. She is discharged home on Prozac 20 mg q.d.  2. Nexium 40 mg q.d.  3. Klonopin 0.5 mg t.i.d.  4. Baby aspirin 81 mg q.d.  5. Verapamil SR 180 mg q.d.   DIET:  She is asked to maintain a low salt, fat, cholesterol diet.   DISCHARGE INSTRUCTIONS:  No smoking or tobacco products.    FOLLOW UP:  She was asked to arrange a follow-up appointment with Dr.  Meredith [name] or Dr. Charlaine Dalton. [name] and Dr. Arturo [name]. [name] as  [name].      Melinda [name], P.A. [facility]  Melinda [name], M.D. [facility]    EW/MEDQ  D:  08/16/2002  T:  08/17/2002  Job:  [id number]   cc:   Melinda [name]. [name], M.D. [facility]   Melinda [name], M.D.  [address], [address]  [place]  [place] [address]  Fax: [phone]   Melinda [name], M.D. [facility]

## 2010-10-23 NOTE — Op Note (Signed)
NAME:  Melinda [name], Melinda [name]                         ACCOUNT NO.:  [id number]   MEDICAL RECORD NO.:  [id number]                   PATIENT TYPE:  AMB   LOCATION:  ENDO                                 FACILITY:  [facility]   PHYSICIAN:  James L. Malon Kindle., M.D.          DATE OF BIRTH:  04-07-1961   DATE OF PROCEDURE:  05/08/2002  DATE OF DISCHARGE:                                 OPERATIVE REPORT   PROCEDURE:  Esophagogastroduodenoscopy.   ENDOSCOPIST:  Llana Aliment. Randa Evens, M.D.   INDICATIONS FOR PROCEDURE:  The patient has had nausea, and has been kept on  Nexium which has helped her heartburn, but not her nausea.  She has had a  history of reflux.  We worked up her gallbladder.  She had sludge in her  gallbladder and delayed emptying on a hepatobiliary scan.  She is being  considered for a cholecystectomy.  She also is apparently seeing Dr. Shan Levans for some pulmonary problems and a chronic cough.  Whether all of this  is due to her reflux or due to biliary disease.  She has not had an  endoscopy in over 10 years, and for this reason an endoscopy is performed.   DESCRIPTION OF PROCEDURE:  The procedure had been explained to the patient  and a consent obtained.  The patient was placed in the left lateral  decubitus position, and the Olympus scope was inserted and advanced.  The  stomach was entered.  The pylorus was identified and passed.  The duodenum,  including the bulb and second portion, were entirely unremarkable.  The  scope was drawn back into the stomach.  The pyloric channel and the antrum  were normal.  The fundus and cardia were well on the retroflexed view and  produced no evidence of ulceration, or erosions, etc.  The distal esophagus  was examined.  There was no significant hiatal hernia.  The distal esophagus  was somewhat reddened and consistent with mild reflux.  The proximal  esophagus was normal.  The scope was withdrawn.  The patient tolerated the procedure well  and was maintained on low-flow  oxygen and pulse oximeter throughout the procedure.    ASSESSMENT:  Mild reflux as manifested by erythema in the esophagus.  No  ulcerations, no Barrett's, or significant hiatal hernia.   PLAN:  The patient will see Dr. Sheppard Plumber. Davis in the office next week for  consideration for a cholecystectomy.                                                James L. Malon Kindle., M.D.    Waldron Session  D:  05/08/2002  T:  05/08/2002  Job:  [id number]   cc:   Sheppard Plumber. Earlene Plater, M.D.  [address]  [place]  [place] [address]  Fax: [phone]   Macario Carls, M.D.   Roetta Sessions, M.D.

## 2010-10-23 NOTE — H&P (Signed)
NAME:  Melinda [name], Melinda [name]                         ACCOUNT NO.:  [id number]   MEDICAL RECORD NO.:  [id number]                   PATIENT TYPE:  INP   LOCATION:  2016                                 FACILITY:  [facility]   PHYSICIAN:  Cecil Cranker, M.D. [facility]         DATE OF BIRTH:  1960/07/23   DATE OF ADMISSION:  DATE OF DISCHARGE:                                HISTORY & PHYSICAL   HISTORY OF PRESENT ILLNESS:  The patient is a 50 year old female with a  history of intermittent substernal chest pain. A left  cardiac  catheterization in 2002 by Dr. Bonnee Quin revealed a  normal ejection  fraction with  minimal MR. There was no critical coronary artery disease.  There was a  minimal spasm noted at the ostium related to catheter  insertion. At that point Dr. [name] felt the patient needed to lower her  cardiovascular risk factors including cholesterol and to quit  smoking.  Unfortunately she is concerned about starting statins and the effect on the  liver. She has also not stopped smoking.   Now she presents with substernal chest pain over the past week which is  intermittent but similar to previous chest pain. I did speak with Dr. Bonnee Quin and we have planned to admit this patient for evaluation.   PAST MEDICAL HISTORY:  A chest x-ray in October 2003 revealed a cavitary  lesion in the right lower lobe. A CT scan revealed a 3-mm nodule on the  right lower lobe. This has been followed by [facility], Charlaine Dalton. Sherene Sires, M.D. She has undergone a tonsillectomy and appendectomy and has a  history of kidney stones.   ALLERGIES:  1. TETRACYCLINE.  2. KEFTAB.  3. CECLOR.  4. CODEINE.  5. VENTOLIN.  6. ALLERGY MEDICINES BECAUSE THEY MAKE HER SHAKE.   CURRENT MEDICATIONS:  1. Prozac 20 mg a day.  2. Klonopin 0.5 mg t.i.d.  3. Nexium 40 mg a day.   SOCIAL HISTORY:  Smoker.   FAMILY HISTORY:  Her father smoked and had emphysema as well as heart  disease. Her mother also  had heart disease.   PHYSICAL EXAMINATION:  GENERAL:  Weight 163.  VITAL SIGNS:  Blood pressure 130/92, pulse 82.  HEENT:  Normal.  NECK:  No carotid bruits, JVD, or thyromegaly.  CHEST:  Clear to auscultation bilaterally. No wheezes or rhonchi.  HEART:  Regular rate and rhythm, no murmurs, rubs, gallops.  ABDOMEN: Normoactive bowel sounds, nontender, no masses, no bruits.  EXTREMITIES:  No extremity edema.   LABORATORY DATA:  Her EKG today reveals normal sinus rhythm with a  ventricular rate of 82, nonspecific STT wave changes which are unchanged  from previous EKGs.   ASSESSMENT:  1. Chest pain.  2. Tobacco abuse.  3. Family history of coronary artery disease.  4. History of increased cholesterol.   PLAN:  The patient will be admitted and placed  on IV nitroglycerin as well  as Lovenox. We will perform a rest stress Cardiolite in the morning and if  this is abnormal we will proceed with cardiac catheterization. We will cycle  enzymes as well and place the patient on telemetry monitor. The patient was  seen  in examination by Dr. Glennon Hamilton.     Guy Franco, P.A. [facility]                      E. Graceann Congress, M.D. [facility]    LB/MEDQ  D:  08/15/2002  T:  08/15/2002  Job:  [id number]   cc:   Arturo Morton. [name], M.D. [facility]   Melida Quitter, M.D.  [address]  [place]  [place] [address]  Fax: [phone]   Meredith Staggers, M.D.  [address]  [place]  [place] [address]  Fax: [phone]

## 2011-01-25 ENCOUNTER — Encounter (HOSPITAL_BASED_OUTPATIENT_CLINIC_OR_DEPARTMENT_OTHER)
Admission: RE | Admit: 2011-01-25 | Discharge: 2011-01-25 | Disposition: A | Discharge status: Home / Self Care | Payer: Managed Care, Other (non HMO) | Source: Ambulatory Visit | Attending: Orthopedic Surgery | Admitting: Orthopedic Surgery

## 2011-01-25 LAB — BASIC METABOLIC PANEL
BUN: 21 mg/dL (ref 6–23)
CO2: 27 mEq/L (ref 19–32)
Calcium: 9.8 mg/dL (ref 8.4–10.5)
Chloride: 105 mEq/L (ref 96–112)
Creatinine, Ser: 0.72 mg/dL (ref 0.50–1.10)
GFR calc Af Amer: >60 mL/min (ref >60)
GFR calc non Af Amer: >60 mL/min (ref >60)
Glucose, Bld: 128 mg/dL — ABNORMAL HIGH (ref 70–99)
Potassium: 4.1 mEq/L (ref 3.5–5.1)
Sodium: 140 mEq/L (ref 135–145)

## 2011-01-26 ENCOUNTER — Other Ambulatory Visit: Payer: Self-pay | Admitting: Plastic Surgery

## 2011-01-26 ENCOUNTER — Ambulatory Visit (HOSPITAL_BASED_OUTPATIENT_CLINIC_OR_DEPARTMENT_OTHER)
Admission: RE | Admit: 2011-01-26 | Discharge: 2011-01-26 | Disposition: A | Discharge status: Home / Self Care | Payer: Managed Care, Other (non HMO) | Source: Ambulatory Visit | Attending: Plastic Surgery | Admitting: Plastic Surgery

## 2011-01-26 DIAGNOSIS — K219 Gastro-esophageal reflux disease without esophagitis: Secondary | ICD-10-CM | POA: Insufficient documentation

## 2011-01-26 DIAGNOSIS — Z01812 Encounter for preprocedural laboratory examination: Secondary | ICD-10-CM | POA: Insufficient documentation

## 2011-01-26 DIAGNOSIS — Q838 Other congenital malformations of breast: Secondary | ICD-10-CM | POA: Insufficient documentation

## 2011-01-26 DIAGNOSIS — N62 Hypertrophy of breast: Secondary | ICD-10-CM | POA: Insufficient documentation

## 2011-01-26 LAB — POCT HEMOGLOBIN-HEMACUE: Hemoglobin: 13.2 g/dL (ref 12.0–15.0)

## 2011-04-27 ENCOUNTER — Encounter: Payer: Self-pay | Admitting: *Deleted

## 2011-04-28 ENCOUNTER — Encounter: Payer: Self-pay | Admitting: Cardiology

## 2011-04-28 ENCOUNTER — Ambulatory Visit (INDEPENDENT_AMBULATORY_CARE_PROVIDER_SITE_OTHER): Payer: Managed Care, Other (non HMO) | Admitting: Cardiology

## 2011-04-28 VITALS — BP 106/64 | HR 75 | Ht 66.0 in | Wt 175.0 lb

## 2011-04-28 DIAGNOSIS — R002 Palpitations: Secondary | ICD-10-CM | POA: Insufficient documentation

## 2011-04-28 DIAGNOSIS — E785 Hyperlipidemia, unspecified: Secondary | ICD-10-CM

## 2011-04-28 DIAGNOSIS — I10 Essential (primary) hypertension: Secondary | ICD-10-CM | POA: Insufficient documentation

## 2011-04-28 MED ORDER — NITROGLYCERIN 0.4 MG SL SUBL: 0.4000 mg | SUBLINGUAL_TABLET | SUBLINGUAL | Status: DC | PRN

## 2011-04-28 NOTE — Progress Notes (Signed)
HPI:  Patient is in for follow up.  Have not seen her in some time.  She wakes up at night with sudden onset of palpitations, with pain that goes up her neck when she has a rapid heart rate.  She has just noted this.  She has stopped smoking.  She does not feel any exertion related chest pain.  These have only been a few episodes of this.  We talked about multiple options today.    Current Outpatient Prescriptions  Medication Sig Dispense Refill  . aspirin 81 MG tablet Take 81 mg by mouth daily.        . clonazePAM (KLONOPIN) 0.5 MG tablet Take 1 tablet by mouth Twice daily.      [name] FLUoxetine (PROZAC) 20 MG capsule Take 1 tablet by mouth daily.      [name] NEXIUM 40 MG capsule Take 1 capsule by mouth daily at 6 (six) AM.      . polyethylene glycol powder (GLYCOLAX/MIRALAX) powder Take 17 g by mouth as needed.        . propranolol (INDERAL) 10 MG tablet Take 10 mg by mouth 2 (two) times daily.        . rosuvastatin (CRESTOR) 5 MG tablet Take 5 mg by mouth daily.          Allergies  Allergen Reactions  . Antihistamines, Diphenhydramine-Type   . Ceclor (Cefaclor)   . Ciprofloxacin   . Codeine   . Erythromycin   . Flagyl (Metronidazole Hcl)   . Floxin (Ofloxacin)   . Keflex     Kef tabs only (maybe dye)  . Macrodantin   . Phenergan   . Prednisolone   . Tetracyclines & Related   . Vibramycin     Past Medical History  Diagnosis Date  . Hypertension   . Chest pain   . Dyslipidemia     untreated  . Family history of early CAD   . GERD (gastroesophageal reflux disease)     Past Surgical History  Procedure Date  . Cardiac catheterization 2002    normal LV function and no significant coronary obstruction  . Tonsillectomy   . Kidney surgery   . Appendectomy   . Partial hysterectomy   . Breast reduction surgery     Family History  Problem Relation Age of Onset  . Coronary artery disease      Fx of  . Heart attack    . Diabetes      History   Social History  . Marital  Status: Married    Spouse Name: N/A    Number of Children: 1  . Years of Education: N/A   Occupational History  . [employer]    Social History Main Topics  . Smoking status: Former Smoker    Quit date: 07/08/2010  . Smokeless tobacco: Not on file   Comment: uses electronic  . Alcohol Use: No  . Drug Use: No  . Sexually Active: Not on file   Other Topics Concern  . Not on file   Social History Narrative  . No narrative on file    ROS: Please see the HPI.  All other systems reviewed and negative.  PHYSICAL EXAM:  BP 106/64  Pulse 75  Ht 5\' 6"  (1.676 m)  Wt 79.379 kg (175 lb)  BMI 28.25 kg/m2  General: Well developed, well nourished, in no acute distress. Head:  Normocephalic and atraumatic. Neck: no JVD Lungs: Clear to auscultation and percussion. Heart: Normal S1  and S2.  No murmur, rubs or gallops.  Abdomen:  Normal bowel sounds; soft; non tender; no organomegaly Pulses: Pulses normal in all 4 extremities. Extremities: No clubbing or cyanosis. No edema. Neurologic: Alert and oriented x 3.  EKG:  NSR.  WNL  ASSESSMENT AND PLAN:

## 2011-04-28 NOTE — Assessment & Plan Note (Addendum)
Done with Dr. Holley Bouche.  On low dose, and total less than 200.  On meds.

## 2011-04-28 NOTE — Progress Notes (Signed)
Patient ID: Melinda [name], female   DOB: Jan 09, 1961, 50 y.o.   MRN: [id number]

## 2011-04-28 NOTE — Assessment & Plan Note (Signed)
Episodes are at night.  She might benefit from an event monitor.  Discussed and she will try.  May have issue with leads due to breaking out.

## 2011-04-28 NOTE — Assessment & Plan Note (Signed)
May need no inderal.  Would taper off and see how she does.  BP is stable.

## 2011-04-28 NOTE — Patient Instructions (Signed)
Your physician recommends that you schedule a follow-up appointment in: 2 MONTHS.   Your physician has recommended that you wear an event monitor. Event monitors are medical devices that record the heart's electrical activity. Doctors most often [place] these monitors to diagnose arrhythmias. Arrhythmias are problems with the speed or rhythm of the heartbeat. The monitor is a small, portable device. You can wear one while you do your normal daily activities. This is usually used to diagnose what is causing palpitations/syncope (passing out).  Please remain well hydrated and increase salt in diet.

## 2011-05-03 ENCOUNTER — Other Ambulatory Visit: Payer: Self-pay | Admitting: Gastroenterology

## 2011-05-04 ENCOUNTER — Telehealth: Payer: Self-pay

## 2011-05-04 NOTE — Telephone Encounter (Signed)
Patient has appt.  For 05/05/11 @ 2:30pm

## 2011-05-05 ENCOUNTER — Encounter (INDEPENDENT_AMBULATORY_CARE_PROVIDER_SITE_OTHER): Payer: Managed Care, Other (non HMO)

## 2011-05-05 DIAGNOSIS — R002 Palpitations: Secondary | ICD-10-CM

## 2011-05-18 ENCOUNTER — Telehealth: Payer: Self-pay | Admitting: Cardiology

## 2011-05-18 NOTE — Telephone Encounter (Signed)
Patient is continuous to hear her heartbeat in her ears, before it was at interval. This is getting on her nerves, and she is concern. Patient is wearing an Event monitor now, which according to [facility], no unusual episodes of palpitations has been reported. Patient was encourage to call her PCP to see if those symptoms can be evaluated further, Patient verbalized understanding.

## 2011-05-18 NOTE — Telephone Encounter (Signed)
New message:  Pt called and is wearing a monitor.  She continues to hear heartbeat in her ears 24/7.  It is really getting on her nerves. Please call and discuss this issue she is concerned that there is a medical issue causing this.  Her extension is [phone].

## 2011-05-20 ENCOUNTER — Other Ambulatory Visit: Payer: Self-pay | Admitting: Family Medicine

## 2011-05-20 DIAGNOSIS — H93A9 Pulsatile tinnitus, unspecified ear: Secondary | ICD-10-CM

## 2011-05-20 DIAGNOSIS — H9209 Otalgia, unspecified ear: Secondary | ICD-10-CM

## 2011-05-21 ENCOUNTER — Ambulatory Visit
Admission: RE | Admit: 2011-05-21 | Discharge: 2011-05-21 | Disposition: A | Discharge status: Home / Self Care | Payer: Managed Care, Other (non HMO) | Source: Ambulatory Visit | Attending: Family Medicine | Admitting: Family Medicine

## 2011-05-21 DIAGNOSIS — H9209 Otalgia, unspecified ear: Secondary | ICD-10-CM

## 2011-05-21 DIAGNOSIS — H93A9 Pulsatile tinnitus, unspecified ear: Secondary | ICD-10-CM

## 2011-05-21 MED ORDER — GADOBENATE DIMEGLUMINE 529 MG/ML IV SOLN
16.0000 mL | Freq: Once | INTRAVENOUS | Status: AC | PRN
  Administered 2011-05-21: 16 mL via INTRAVENOUS

## 2011-05-23 ENCOUNTER — Other Ambulatory Visit: Payer: Managed Care, Other (non HMO)

## 2011-06-29 ENCOUNTER — Ambulatory Visit: Payer: Managed Care, Other (non HMO) | Admitting: Cardiology

## 2011-06-29 ENCOUNTER — Telehealth: Payer: Self-pay | Admitting: Cardiology

## 2011-06-29 NOTE — Telephone Encounter (Signed)
New Msg: Pt sister Sandre Kitty) calling to rs pt appt today. Pt was called into work for a meeting she is not able to leave and wanted to rs. Soonest available appt with Dr. [name] is not until 07/26/11. Pt sister wants to know if pt can be worked in sooner. Please return pt call to discuss further.

## 2011-06-29 NOTE — Telephone Encounter (Signed)
I spoke with the pt and she had to go to a meeting at her job [employer]) and was laid off.  I made the pt aware that I could have her see Dr [name] tomorrow at 3:45.  Appointment scheduled.

## 2011-06-30 ENCOUNTER — Ambulatory Visit (INDEPENDENT_AMBULATORY_CARE_PROVIDER_SITE_OTHER): Payer: Managed Care, Other (non HMO) | Admitting: Cardiology

## 2011-06-30 ENCOUNTER — Encounter: Payer: Self-pay | Admitting: Cardiology

## 2011-06-30 DIAGNOSIS — I10 Essential (primary) hypertension: Secondary | ICD-10-CM

## 2011-06-30 DIAGNOSIS — R002 Palpitations: Secondary | ICD-10-CM

## 2011-06-30 DIAGNOSIS — E785 Hyperlipidemia, unspecified: Secondary | ICD-10-CM

## 2011-06-30 NOTE — Progress Notes (Signed)
Patient ID: Melinda [name], female   DOB: 02/11/1961, 50 y.o.   MRN: [id number]  

## 2011-06-30 NOTE — Patient Instructions (Signed)
Your physician wants you to follow-up in: 1 YEAR.  You will receive a reminder letter in the mail two months in advance. If you don't receive a letter, please call our office to schedule the follow-up appointment.  Your physician recommends that you continue on your current medications as directed. Please refer to the Current Medication list given to you today.  

## 2011-06-30 NOTE — Progress Notes (Signed)
HPI:  She is doing great at the present time.  She lost her job at [employer], but is about to get another.  She sort of new it was coming.  She denies any new symptoms, and feels pretty good. No major findings on event monitor.    Current Outpatient Prescriptions  Medication Sig Dispense Refill  . aspirin 81 MG tablet Take 81 mg by mouth daily.        . clonazePAM (KLONOPIN) 0.5 MG tablet Take 1 tablet by mouth Twice daily.      [name] FLUoxetine (PROZAC) 20 MG capsule Take 1 tablet by mouth daily.      [name] NEXIUM 40 MG capsule Take 1 capsule by mouth daily at 6 (six) AM.      . nitroGLYCERIN (NITROSTAT) 0.4 MG SL tablet Place 1 tablet (0.4 mg total) under the tongue every 5 (five) minutes as needed for chest pain.  25 tablet  3  . polyethylene glycol powder (GLYCOLAX/MIRALAX) powder Take 17 g by mouth as needed.        . propranolol (INDERAL) 10 MG tablet Take 10 mg by mouth 2 (two) times daily.        . rosuvastatin (CRESTOR) 5 MG tablet Take 5 mg by mouth daily.          Allergies  Allergen Reactions  . Antihistamines, Diphenhydramine-Type   . Ceclor (Cefaclor)   . Ciprofloxacin   . Codeine   . Erythromycin   . Flagyl (Metronidazole Hcl)   . Floxin (Ofloxacin)   . Keflex     Kef tabs only (maybe dye)  . Macrodantin   . Phenergan   . Prednisolone   . Tetracyclines & Related   . Vibramycin     Past Medical History  Diagnosis Date  . Hypertension   . Chest pain   . Dyslipidemia     untreated  . Family history of early CAD   . GERD (gastroesophageal reflux disease)     Past Surgical History  Procedure Date  . Cardiac catheterization 2002    normal LV function and no significant coronary obstruction  . Tonsillectomy   . Kidney surgery   . Appendectomy   . Partial hysterectomy   . Breast reduction surgery     Family History  Problem Relation Age of Onset  . Coronary artery disease      Fx of  . Heart attack    . Diabetes      History   Social History  . Marital  Status: Married    Spouse Name: N/A    Number of Children: 1  . Years of Education: N/A   Occupational History  . [employer]    Social History Main Topics  . Smoking status: Former Smoker    Quit date: 07/08/2010  . Smokeless tobacco: Not on file   Comment: uses electronic  . Alcohol Use: No  . Drug Use: No  . Sexually Active: Not on file   Other Topics Concern  . Not on file   Social History Narrative  . No narrative on file    ROS: Please see the HPI.  All other systems reviewed and negative.  PHYSICAL EXAM:  BP 118/84  Pulse 74  Ht 5\' 6"  (1.676 m)  Wt 79.379 kg (175 lb)  BMI 28.25 kg/m2  General: Well developed, well nourished, in no acute distress. Head:  Normocephalic and atraumatic. Neck: no JVD Lungs: Clear to auscultation and percussion. Heart: Normal S1  and S2.  No murmur, rubs or gallops.  Abdomen:  Normal bowel sounds; soft; non tender; no organomegaly Pulses: Pulses normal in all 4 extremities. Extremities: No clubbing or cyanosis. No edema. Neurologic: Alert and oriented x 3.  EKG:  ASSESSMENT AND PLAN:

## 2011-07-19 NOTE — Assessment & Plan Note (Signed)
Controlled at the present time.  

## 2011-07-19 NOTE — Assessment & Plan Note (Signed)
Symptoms much improved.  Continue to monitor.

## 2011-07-19 NOTE — Assessment & Plan Note (Signed)
Followed by primary care.   

## 2012-05-02 ENCOUNTER — Ambulatory Visit (INDEPENDENT_AMBULATORY_CARE_PROVIDER_SITE_OTHER): Payer: Managed Care, Other (non HMO) | Admitting: Cardiology

## 2012-05-02 ENCOUNTER — Encounter: Payer: Self-pay | Admitting: Cardiology

## 2012-05-02 VITALS — BP 108/80 | HR 84 | Ht 67.0 in | Wt 176.0 lb

## 2012-05-02 DIAGNOSIS — I10 Essential (primary) hypertension: Secondary | ICD-10-CM

## 2012-05-02 DIAGNOSIS — E785 Hyperlipidemia, unspecified: Secondary | ICD-10-CM

## 2012-05-02 DIAGNOSIS — R002 Palpitations: Secondary | ICD-10-CM

## 2012-05-02 MED ORDER — NITROGLYCERIN 0.4 MG SL SUBL: 0.4000 mg | SUBLINGUAL_TABLET | SUBLINGUAL | Status: DC | PRN

## 2012-05-02 NOTE — Patient Instructions (Addendum)
Your physician wants you to follow-up in: 6 MONTHS with Dr Dietrich Pates.  You will receive a reminder letter in the mail two months in advance. If you don't receive a letter, please call our office to schedule the follow-up appointment.  Your physician recommends that you continue on your current medications as directed. Please refer to the Current Medication list given to you today.

## 2012-05-07 NOTE — Assessment & Plan Note (Signed)
Managed by Dr. Tiburcio Pea.

## 2012-05-07 NOTE — Progress Notes (Signed)
HPI:  Melinda [name] comes in today for follow up.  Overall, she is stable.  She is back out of work at this time, and is getting ready to look again for another job.  She is not taking Crestor at present, although was on a very low dose before.  She asked about a NTG prescription.    Current Outpatient Prescriptions  Medication Sig Dispense Refill  . aspirin 81 MG tablet Take 81 mg by mouth daily.        . clonazePAM (KLONOPIN) 0.5 MG tablet Take 1 1/2 tablet  in the morning and 1 tablet at night      . FLUoxetine (PROZAC) 20 MG capsule Take 1 tablet by mouth daily.      [name] NEXIUM 40 MG capsule Take 1 capsule by mouth daily at 6 (six) AM.      . polyethylene glycol powder (GLYCOLAX/MIRALAX) powder Take 17 g by mouth as needed.        . propranolol (INDERAL) 10 MG tablet Take 10 mg by mouth once.       . nitroGLYCERIN (NITROSTAT) 0.4 MG SL tablet Place 1 tablet (0.4 mg total) under the tongue every 5 (five) minutes as needed for chest pain.  25 tablet  3  . nitroGLYCERIN (NITROSTAT) 0.4 MG SL tablet Place 1 tablet (0.4 mg total) under the tongue every 5 (five) minutes as needed for chest pain.  25 tablet  3    Allergies  Allergen Reactions  . Antihistamines, Diphenhydramine-Type   . Ceclor (Cefaclor)   . Cephalexin     Kef tabs only (maybe dye)  . Ciprofloxacin   . Codeine   . Doxycycline Calcium   . Erythromycin   . Flagyl (Metronidazole Hcl)   . Floxin (Ofloxacin)   . Macrodantin   . Prednisolone   . Promethazine Hcl   . Tetracyclines & Related     Past Medical History  Diagnosis Date  . Hypertension   . Chest pain   . Dyslipidemia     untreated  . Family history of early CAD   . GERD (gastroesophageal reflux disease)     Past Surgical History  Procedure Date  . Cardiac catheterization 2002    normal LV function and no significant coronary obstruction  . Tonsillectomy   . Kidney surgery   . Appendectomy   . Partial hysterectomy   . Breast reduction surgery      Family History  Problem Relation Age of Onset  . Coronary artery disease      Fx of  . Heart attack    . Diabetes      History   Social History  . Marital Status: Married    Spouse Name: N/A    Number of Children: 1  . Years of Education: N/A   Occupational History  . [employer]    Social History Main Topics  . Smoking status: Former Smoker    Quit date: 07/08/2010  . Smokeless tobacco: Not on file     Comment: uses electronic  . Alcohol Use: No  . Drug Use: No  . Sexually Active: Not on file   Other Topics Concern  . Not on file   Social History Narrative  . No narrative on file    ROS: Please see the HPI.  All other systems reviewed and negative.  PHYSICAL EXAM:  BP 108/80  Pulse 84  Ht 5\' 7"  (1.702 m)  Wt 176 lb (79.833 kg)  BMI 27.57  kg/m2  SpO2 96%  General: Well developed, well nourished, in no acute distress. Head:  Normocephalic and atraumatic. Neck: no JVD Lungs: Clear to auscultation and percussion. Heart: Normal S1 and S2.  No murmur, rubs or gallops.  Pulses: Pulses normal in all 4 extremities. Extremities: No clubbing or cyanosis. No edema. Neurologic: Alert and oriented x 3.  EKG:  NSR.  WNL.  No acute changes.    ASSESSMENT AND PLAN:

## 2012-05-07 NOTE — Assessment & Plan Note (Signed)
Not elevated

## 2012-05-07 NOTE — Assessment & Plan Note (Addendum)
These are periodic, but seem stable for now.  Uses proranolol for palpitations once daily.  Discussed meds.

## 2012-06-12 ENCOUNTER — Telehealth: Payer: Self-pay | Admitting: Cardiology

## 2012-06-12 NOTE — Telephone Encounter (Signed)
New Problem:    Patient called in because she is still feeling like she is going to pass out despite the suggestions she was given at her last appointment.  Please call back to consult.

## 2012-06-12 NOTE — Telephone Encounter (Signed)
Per Dr [name] he would like the pt to be seen this week by the PA/NP.  He does not want the pt driving at this time.  I left a message on the pt's voicemail to contact our office.

## 2012-06-13 NOTE — Telephone Encounter (Signed)
F/u   Returning call back to nurse Lauren from yesterday.

## 2012-06-13 NOTE — Telephone Encounter (Signed)
Per Dr [name], app made with Norma Fredrickson NP,  appointment  Made and was informed not to drive, pt agreeable to plan.

## 2012-06-14 ENCOUNTER — Telehealth: Payer: Self-pay | Admitting: *Deleted

## 2012-06-14 ENCOUNTER — Ambulatory Visit (INDEPENDENT_AMBULATORY_CARE_PROVIDER_SITE_OTHER): Payer: Managed Care, Other (non HMO) | Admitting: Nurse Practitioner

## 2012-06-14 ENCOUNTER — Encounter: Payer: Self-pay | Admitting: Nurse Practitioner

## 2012-06-14 VITALS — BP 120/82 | HR 72 | Ht 66.0 in | Wt 174.1 lb

## 2012-06-14 DIAGNOSIS — R55 Syncope and collapse: Secondary | ICD-10-CM

## 2012-06-14 DIAGNOSIS — R002 Palpitations: Secondary | ICD-10-CM

## 2012-06-14 NOTE — Progress Notes (Signed)
Memory Argue Date of Birth: 1961/02/09 Medical Record #[id number]  History of Present Illness: Melinda [name] is seen today for a work in visit. She is seen for Dr. [name]. She has a history of palpitations. No known CAD per remote cath. Other issues include anxiety, HLD and GERD.   She called earlier this week to report that she was feeling as if she were going to pass out. She has been told to not drive.   She comes in today. She is here alone. She is not driving. Says she is having more and more spells of presyncope. It is getting worse. She has not actually passed out. She can usually tell when it is coming on and will squat down to abort the symptom. She will get hot and flushed. She endorses fatigue. Says her muscles are aching. Has these spells basically every day and will have multiple spells during the course of the day. Palpitations are only at night and not really associated with the presyncope. Just been told she has Type 2 DM. No longer smoking. Does not check her blood pressure at home and rarely checking her sugar now. She does endorse anxiety as well. She is currently off of her statin due to memory issues but is going to be restarting due to recent labs with her PCP.  Current Outpatient Prescriptions on File Prior to Visit  Medication Sig Dispense Refill  . aspirin 81 MG tablet Take 81 mg by mouth daily.        . clonazePAM (KLONOPIN) 0.5 MG tablet Take 1 1/2 tablet  in the morning and 1 tablet at night      . FLUoxetine (PROZAC) 20 MG capsule Take 1 tablet by mouth daily.      [name] NEXIUM 40 MG capsule Take 1 capsule by mouth daily at 6 (six) AM.      . nitroGLYCERIN (NITROSTAT) 0.4 MG SL tablet Place 1 tablet (0.4 mg total) under the tongue every 5 (five) minutes as needed for chest pain.  25 tablet  3  . polyethylene glycol powder (GLYCOLAX/MIRALAX) powder Take 17 g by mouth as needed.        . propranolol (INDERAL) 10 MG tablet Take 10 mg by mouth once.         Allergies    Allergen Reactions  . Antihistamines, Diphenhydramine-Type   . Ceclor (Cefaclor)   . Cephalexin     Kef tabs only (maybe dye)  . Ciprofloxacin   . Codeine   . Doxycycline Calcium   . Erythromycin   . Flagyl (Metronidazole Hcl)   . Floxin (Ofloxacin)   . Macrodantin   . Prednisolone   . Promethazine Hcl   . Tetracyclines & Related     Past Medical History  Diagnosis Date  . Hypertension   . Dyslipidemia     untreated  . Family history of early CAD   . GERD (gastroesophageal reflux disease)   . Palpitation   . DM (diabetes mellitus)     Past Surgical History  Procedure Date  . Cardiac catheterization 2002    normal LV function and no significant coronary obstruction  . Tonsillectomy   . Kidney surgery   . Appendectomy   . Partial hysterectomy   . Breast reduction surgery     History  Smoking status  . Former Smoker  . Quit date: 07/08/2010  Smokeless tobacco  . Not on file    Comment: uses electronic    History  Alcohol Use  No    Family History  Problem Relation Age of Onset  . Coronary artery disease      Fx of  . Heart attack    . Diabetes    . Heart disease Mother   . Liver disease Mother   . Heart disease Father   . Heart attack Father     Review of Systems: The review of systems is per the HPI.  All other systems were reviewed and are negative.  Physical Exam: BP 120/82  Pulse 72  Ht 5\' 6"  (1.676 m)  Wt 174 lb 1.9 oz (78.98 kg)  BMI 28.10 kg/m2 Sitting BP by me is 110/70 and standing BP is 120/80. Patient is very pleasant and in no acute distress. Skin is warm and dry. Color is normal.  HEENT is unremarkable. Normocephalic/atraumatic. PERRL. Sclera are nonicteric. Neck is supple. No masses. No JVD. Lungs are clear. Cardiac exam shows a regular rate and rhythm. Abdomen is soft. Extremities are without edema. Gait and ROM are intact. No gross neurologic deficits noted.   LABORATORY DATA: EKG today shows sinus rhythm and is normal.    Lab Results  Component Value Date   HGB 13.2 01/26/2011   GLUCOSE 128* 01/25/2011   NA 140 01/25/2011   K 4.1 01/25/2011   CL 105 01/25/2011   CREATININE 0.72 01/25/2011   BUN 21 01/25/2011   CO2 27 01/25/2011     Assessment / Plan:  1. Presyncope - hard to say what this may be. She has had past evaluation. Will arrange for 24 hour Monitor since this happens every day. Arrange for Myoview as well. I have asked her to liberalize her salt intake. I have asked her to try and check both her blood sugar and her BP during these events.   2. Palpitations - does not seem to be related to her presyncope. No change in her medicines for now.   3. Multiple CV risk factors -which includes using electronic cig, HLD and positive FH with DM. Will arrange for Myoview.   4. HLD - to restart statin therapy  I think if her cardiac workup is negative, would proceed with neuro evaluation. She is to see Dr. [name] in 2 weeks. She is not to drive for now.   Patient is agreeable to this plan and will call if any problems develop in the interim.

## 2012-06-14 NOTE — Telephone Encounter (Signed)
24 hour holter moniter placed on Pt 06/14/12 TK

## 2012-06-14 NOTE — Patient Instructions (Addendum)
We need to place a monitor for 24 hours  Liberalize your salt  We are going to arrange for a stress test (Stress Myoview)  For now, stay on your current medicines  Avoid caffeine  Try to check your sugar and your blood pressure during these spells  You should not drive  See Dr. [name] in 2 weeks for follow up  Call the [facility] office at [phone] if you have any questions, problems or concerns.

## 2012-06-15 ENCOUNTER — Ambulatory Visit (HOSPITAL_COMMUNITY): Payer: Managed Care, Other (non HMO) | Attending: Cardiology | Admitting: Radiology

## 2012-06-15 VITALS — BP 121/72 | HR 78 | Ht 66.0 in | Wt 177.0 lb

## 2012-06-15 DIAGNOSIS — E119 Type 2 diabetes mellitus without complications: Secondary | ICD-10-CM | POA: Insufficient documentation

## 2012-06-15 DIAGNOSIS — R0602 Shortness of breath: Secondary | ICD-10-CM | POA: Insufficient documentation

## 2012-06-15 DIAGNOSIS — R079 Chest pain, unspecified: Secondary | ICD-10-CM

## 2012-06-15 DIAGNOSIS — R0789 Other chest pain: Secondary | ICD-10-CM | POA: Insufficient documentation

## 2012-06-15 DIAGNOSIS — R002 Palpitations: Secondary | ICD-10-CM | POA: Insufficient documentation

## 2012-06-15 DIAGNOSIS — R0609 Other forms of dyspnea: Secondary | ICD-10-CM | POA: Insufficient documentation

## 2012-06-15 DIAGNOSIS — R42 Dizziness and giddiness: Secondary | ICD-10-CM | POA: Insufficient documentation

## 2012-06-15 DIAGNOSIS — R0989 Other specified symptoms and signs involving the circulatory and respiratory systems: Secondary | ICD-10-CM | POA: Insufficient documentation

## 2012-06-15 DIAGNOSIS — R55 Syncope and collapse: Secondary | ICD-10-CM

## 2012-06-15 MED ORDER — TECHNETIUM TC 99M SESTAMIBI GENERIC - CARDIOLITE
30.0000 | Freq: Once | INTRAVENOUS | Status: AC | PRN
  Administered 2012-06-15: 30 via INTRAVENOUS

## 2012-06-15 MED ORDER — TECHNETIUM TC 99M SESTAMIBI GENERIC - CARDIOLITE
10.0000 | Freq: Once | INTRAVENOUS | Status: AC | PRN
  Administered 2012-06-15: 10 via INTRAVENOUS

## 2012-06-15 NOTE — Progress Notes (Signed)
[facility] SITE [address] NUCLEAR MED [address] [place], [place] [address] [phone]    Cardiology Nuclear Med Study  Melinda [name] is a 51 y.o. female     MRN : [id number]     DOB: 12/28/1960  Procedure Date: 06/15/2012  Nuclear Med Background Indication for Stress Test:  Evaluation for Ischemia History:  '02 Cath:No critical CAD, minimal MR, normal EF; '04 OZH:YQMVHQIO apical mild ischemia vs BA, EF=67% Cardiac Risk Factors: Family History - CAD, Lipids, NIDDM and Currently Using Electronic Cig.  Symptoms:  Chest Pain/Tightness with and without Exertion (last episode of chest discomfort was about 71-months ago), Diaphoresis, Dizziness, DOE, Fatigue, Near Syncope and Palpitations   Nuclear Pre-Procedure Caffeine/Decaff Intake:  None NPO After: 8:00am   Lungs:  Clear. O2 Sat: 98% on room air. IV 0.9% NS with Angio Cath:  22g  IV Site: R Hand  IV Started by:  Doyne Keel, CNMT  Chest Size (in):  38 Cup Size: B  Height: 5\' 6"  (1.676 m)  Weight:  177 lb (80.287 kg)  BMI:  Body mass index is 28.57 kg/(m^2). Tech Comments:  Held Inderal x 40 hrs; held all am Insurance underwriter Med Study 1 or 2 day study: 1 day  Stress Test Type:  Stress  Reading MD: Olga Millers, MD  Order Authorizing Provider:  Shawnie Pons, MD  Resting Radionuclide: Technetium 27m Sestamibi  Resting Radionuclide Dose: 11.0 mCi   Stress Radionuclide:  Technetium 54m Sestamibi  Stress Radionuclide Dose: 33.0 mCi           Stress Protocol Rest HR: 78 Stress HR: 171  Rest BP: 121/72 Stress BP: 184/74  Exercise Time (min): 5:39 METS: 7.0   Predicted Max HR: 167 bpm % Max HR: 102.4 bpm Rate Pressure Product: 96295    Dose of Adenosine (mg):  n/a Dose of Lexiscan: n/a mg  Dose of Atropine (mg): n/a Dose of Dobutamine: n/a mcg/kg/min (at max HR)  Stress Test Technologist: Smiley Houseman, CMA-N  Nuclear Technologist:  Domenic Polite, CNMT     Rest Procedure:  Myocardial perfusion imaging was  performed at rest 45 minutes following the intravenous administration of Technetium 2m Sestamibi.  Rest ECG: NSR - Normal EKG  Stress Procedure:  The patient exercised on the treadmill utilizing the Bruce Protocol for 5:39 minutes. The patient stopped due to fatigue and dyspnea; peak O2 SAT was 99%. She denied any chest pain.  Technetium 21m Sestamibi was injected at peak exercise and myocardial perfusion imaging was performed after a brief delay.  Stress ECG: Significant ST abnormalities consistent with ischemia.  QPS Raw Data Images:  Acquisition technically good; normal left ventricular size. Stress Images:  Normal homogeneous uptake in all areas of the myocardium. Rest Images:  Normal homogeneous uptake in all areas of the myocardium. Subtraction (SDS):  No evidence of ischemia. Transient Ischemic Dilatation (Normal <1.22):  0.94 Lung/Heart Ratio (Normal <0.45):  0.33  Quantitative Gated Spect Images QGS EDV:  48 ml QGS ESV:  7 ml  Impression Exercise Capacity:  Fair exercise capacity. BP Response:  Normal blood pressure response. Clinical Symptoms:  There is dyspnea. ECG Impression:  Significant ST abnormalities consistent with ischemia. Comparison with Prior Nuclear Study: No images to compare  Overall Impression:  Low risk stress nuclear study with ECG changes but normal perfusion.  LV Ejection Fraction: 85%.  LV Wall Motion:  NL LV Function; NL Wall Motion  Olga Millers

## 2012-06-19 ENCOUNTER — Telehealth: Payer: Self-pay | Admitting: Cardiology

## 2012-06-19 ENCOUNTER — Ambulatory Visit (HOSPITAL_COMMUNITY): Payer: Managed Care, Other (non HMO) | Attending: Family Medicine | Admitting: Radiology

## 2012-06-19 DIAGNOSIS — I059 Rheumatic mitral valve disease, unspecified: Secondary | ICD-10-CM | POA: Insufficient documentation

## 2012-06-19 DIAGNOSIS — R079 Chest pain, unspecified: Secondary | ICD-10-CM

## 2012-06-19 DIAGNOSIS — R55 Syncope and collapse: Secondary | ICD-10-CM

## 2012-06-19 DIAGNOSIS — R072 Precordial pain: Secondary | ICD-10-CM | POA: Insufficient documentation

## 2012-06-19 DIAGNOSIS — E119 Type 2 diabetes mellitus without complications: Secondary | ICD-10-CM | POA: Insufficient documentation

## 2012-06-19 DIAGNOSIS — I10 Essential (primary) hypertension: Secondary | ICD-10-CM | POA: Insufficient documentation

## 2012-06-19 DIAGNOSIS — R002 Palpitations: Secondary | ICD-10-CM | POA: Insufficient documentation

## 2012-06-19 NOTE — Telephone Encounter (Signed)
Per Dr [name] he would like the pt to have an Echocardiogram to evaluate for RV enlargement, OV with Norma Fredrickson NP and cardiac cath this week. At this time the pt denies any swelling.  The pt states at times she will have some swelling at her sock line when she is on her feet. The pt has been scheduled for Echo today at 4:00 and OV with Lawson Fiscal tomorrow at 2:00.

## 2012-06-19 NOTE — Progress Notes (Signed)
Echocardiogram performed.  

## 2012-06-19 NOTE — Telephone Encounter (Signed)
Pt calling re dr [name] call from Friday re setting up a procedure, thought she was to be called first thing this am

## 2012-06-20 ENCOUNTER — Ambulatory Visit (INDEPENDENT_AMBULATORY_CARE_PROVIDER_SITE_OTHER): Payer: Managed Care, Other (non HMO) | Admitting: Cardiology

## 2012-06-20 ENCOUNTER — Ambulatory Visit: Payer: Managed Care, Other (non HMO) | Admitting: Nurse Practitioner

## 2012-06-20 ENCOUNTER — Ambulatory Visit
Admission: RE | Admit: 2012-06-20 | Discharge: 2012-06-20 | Disposition: A | Discharge status: Home / Self Care | Payer: Managed Care, Other (non HMO) | Source: Ambulatory Visit | Attending: Cardiology | Admitting: Cardiology

## 2012-06-20 ENCOUNTER — Encounter: Payer: Self-pay | Admitting: Cardiology

## 2012-06-20 VITALS — BP 120/80 | HR 98 | Ht 66.0 in | Wt 175.1 lb

## 2012-06-20 DIAGNOSIS — I10 Essential (primary) hypertension: Secondary | ICD-10-CM

## 2012-06-20 DIAGNOSIS — E785 Hyperlipidemia, unspecified: Secondary | ICD-10-CM

## 2012-06-20 DIAGNOSIS — D509 Iron deficiency anemia, unspecified: Secondary | ICD-10-CM

## 2012-06-20 DIAGNOSIS — R55 Syncope and collapse: Secondary | ICD-10-CM

## 2012-06-20 LAB — BASIC METABOLIC PANEL
BUN: 23 mg/dL (ref 6–23)
CO2: 27 mEq/L (ref 19–32)
Calcium: 9.7 mg/dL (ref 8.4–10.5)
Chloride: 104 mEq/L (ref 96–112)
Creatinine, Ser: 0.9 mg/dL (ref 0.4–1.2)

## 2012-06-20 LAB — PROTIME-INR: INR: 1.1 ratio — ABNORMAL HIGH (ref 0.8–1.0)

## 2012-06-20 LAB — CBC WITH DIFFERENTIAL/PLATELET
Basophils Absolute: 0.1 10*3/uL (ref 0.0–0.1)
Eosinophils Absolute: 0.2 10*3/uL (ref 0.0–0.7)
Lymphocytes Relative: 37.2 % (ref 12.0–46.0)
MCHC: 30.6 g/dL (ref 30.0–36.0)
MCV: 67.5 fl — ABNORMAL LOW (ref 78.0–100.0)
Monocytes Absolute: 0.5 10*3/uL (ref 0.1–1.0)
Neutrophils Relative %: 53.1 % (ref 43.0–77.0)
RDW: 15.4 % — ABNORMAL HIGH (ref 11.5–14.6)

## 2012-06-20 LAB — APTT: aPTT: 35.2 s — ABNORMAL HIGH (ref 21.7–28.8)

## 2012-06-22 ENCOUNTER — Telehealth: Payer: Self-pay | Admitting: Cardiology

## 2012-06-22 ENCOUNTER — Encounter: Payer: Self-pay | Admitting: Cardiology

## 2012-06-22 DIAGNOSIS — D509 Iron deficiency anemia, unspecified: Secondary | ICD-10-CM

## 2012-06-22 HISTORY — DX: Iron deficiency anemia, unspecified: D50.9

## 2012-06-22 NOTE — Progress Notes (Signed)
Memory Argue Date of Birth: 1961-03-12 Medical Record #[id number]  History of Present Illness: Melinda [name] is seen for a follow up visit to consider a cardiac cath. She has a history of palpitations. No known CAD by cath in 2002. Other issues include anxiety, HLD and GERD.  Says she is having more and more spells of presyncope. It is getting worse. She has not actually passed out. She can usually tell when it is coming on and will squat down to abort the symptom. She will get hot and flushed. She endorses fatigue. Says her muscles are aching. Has these spells basically every day and will have multiple spells during the course of the day. Palpitations are only at night and not really associated with the presyncope. She does have a history of syncope in the past and reports a positive tilt table test with Dr. Graciela Husbands before. She was placed on Prozac and her symptoms improved.  Current Outpatient Prescriptions on File Prior to Visit  Medication Sig Dispense Refill  . aspirin 81 MG tablet Take 81 mg by mouth daily.        . clonazePAM (KLONOPIN) 0.5 MG tablet Take 1 1/2 tablet  in the morning and 1 tablet at night      . FLUoxetine (PROZAC) 20 MG capsule Take 1 tablet by mouth daily.      [name] NEXIUM 40 MG capsule Take 1 capsule by mouth daily at 6 (six) AM.      . nitroGLYCERIN (NITROSTAT) 0.4 MG SL tablet Place 1 tablet (0.4 mg total) under the tongue every 5 (five) minutes as needed for chest pain.  25 tablet  3  . propranolol (INDERAL) 10 MG tablet Take 10 mg by mouth once.       . rosuvastatin (CRESTOR) 5 MG tablet Take 5 mg by mouth daily.        Allergies  Allergen Reactions  . Antihistamines, Diphenhydramine-Type   . Ceclor (Cefaclor)   . Cephalexin     Kef tabs only (maybe dye)  . Ciprofloxacin   . Codeine   . Doxycycline Calcium   . Erythromycin   . Flagyl (Metronidazole Hcl)   . Floxin (Ofloxacin)   . Macrodantin   . Prednisolone   . Promethazine Hcl   . Tetracyclines & Related       Past Medical History  Diagnosis Date  . Hypertension   . Dyslipidemia     untreated  . Family history of early CAD   . GERD (gastroesophageal reflux disease)   . Palpitation   . DM (diabetes mellitus)     Past Surgical History  Procedure Date  . Cardiac catheterization 2002    normal LV function and no significant coronary obstruction  . Tonsillectomy   . Kidney surgery   . Appendectomy   . Partial hysterectomy   . Breast reduction surgery     History  Smoking status  . Former Smoker  . Quit date: 07/08/2010  Smokeless tobacco  . Not on file    Comment: uses electronic    History  Alcohol Use No    Family History  Problem Relation Age of Onset  . Coronary artery disease      Fx of  . Heart attack    . Diabetes    . Heart disease Mother   . Liver disease Mother   . Heart disease Father   . Heart attack Father     Review of Systems: The review of systems is  per the HPI.  All other systems were reviewed and are negative.  Physical Exam: BP 120/80  Pulse 98  Ht 5\' 6"  (1.676 m)  Wt 175 lb 1.9 oz (79.434 kg)  BMI 28.27 kg/m2  SpO2 99% She is not orthostatic. Patient is very pleasant and in no acute distress. Skin is warm and dry. Color is normal.  HEENT is unremarkable. Normocephalic/atraumatic. PERRL. Sclera are nonicteric. Neck is supple. No masses. No JVD. Lungs are clear. Cardiac exam shows a regular rate and rhythm. Abdomen is soft. Extremities are without edema. Gait and ROM are intact. No gross neurologic deficits noted.   LABORATORY DATA:   Lab Results  Component Value Date   WBC 7.6 06/20/2012   HGB 9.5* 06/20/2012   HCT 31.1* 06/20/2012   PLT 388.0 06/20/2012   GLUCOSE 177* 06/20/2012   NA 137 06/20/2012   K 4.3 06/20/2012   CL 104 06/20/2012   CREATININE 0.9 06/20/2012   BUN 23 06/20/2012   CO2 27 06/20/2012   INR 1.1* 06/20/2012   24 hour Holter monitor is completely normal.  Cardiology Nuclear Med Study  Melinda [name] is a 52 y.o.  female MRN : [id number] DOB: 06-Sep-1960  Procedure Date: 06/15/2012  Nuclear Med Background  Indication for Stress Test: Evaluation for Ischemia  History: '02 Cath:No critical CAD, minimal MR, normal EF; '04 MVH:[id number] apical mild ischemia vs BA, EF=67%  Cardiac Risk Factors: Family History - CAD, Lipids, NIDDM and Currently Using Electronic Cig.  Symptoms: Chest Pain/Tightness with and without Exertion (last episode of chest discomfort was about 56-months ago), Diaphoresis, Dizziness, DOE, Fatigue, Near Syncope and Palpitations  Nuclear Pre-Procedure  Caffeine/Decaff Intake: None  NPO After: 8:00am   Lungs: Clear.  O2 Sat: 98% on room air.  IV 0.9% NS with Angio Cath: 22g   IV Site: R Hand  IV Started by: Doyne Keel, CNMT   Chest Size (in): 38  Cup Size: B   Height: 5\' 6"  (1.676 m)  Weight: 177 lb (80.287 kg)   BMI: Body mass index is 28.57 kg/(m^2).  Tech Comments: Held Inderal x 40 hrs; held all am Engineer, manufacturing Med Study  1 or 2 day study: 1 day  Stress Test Type: Stress   Reading MD: Olga Millers, MD  Order Authorizing Provider: Shawnie Pons, MD   Resting Radionuclide: Technetium 79m Sestamibi  Resting Radionuclide Dose: 11.0 mCi   Stress Radionuclide: Technetium 35m Sestamibi  Stress Radionuclide Dose: 33.0 mCi   Stress Protocol  Rest HR: 78  Stress HR: 171   Rest BP: 121/72  Stress BP: 184/74   Exercise Time (min): 5:39  METS: 7.0   Predicted Max HR: 167 bpm  % Max HR: 102.4 bpm  Rate Pressure Product: 28413  Dose of Adenosine (mg): n/a  Dose of Lexiscan: n/a mg   Dose of Atropine (mg): n/a  Dose of Dobutamine: n/a mcg/kg/min (at max HR)   Stress Test Technologist: Smiley Houseman, CMA-N  Nuclear Technologist: Domenic Polite, CNMT   Rest Procedure: Myocardial perfusion imaging was performed at rest 45 minutes following the intravenous administration of Technetium 90m Sestamibi.  Rest ECG: NSR - Normal EKG  Stress Procedure: The patient exercised on the treadmill utilizing  the Bruce Protocol for 5:39 minutes. The patient stopped due to fatigue and dyspnea; peak O2 SAT was 99%. She denied any chest pain. Technetium 30m Sestamibi was injected at peak exercise and myocardial perfusion imaging was performed after a brief delay.  Stress ECG:  Significant ST abnormalities consistent with ischemia.  QPS  Raw Data Images: Acquisition technically good; normal left ventricular size.  Stress Images: Normal homogeneous uptake in all areas of the myocardium.  Rest Images: Normal homogeneous uptake in all areas of the myocardium.  Subtraction (SDS): No evidence of ischemia.  Transient Ischemic Dilatation (Normal <1.22): 0.94  Lung/Heart Ratio (Normal <0.45): 0.33  Quantitative Gated Spect Images  QGS EDV: 48 ml  QGS ESV: 7 ml  Impression  Exercise Capacity: Fair exercise capacity.  BP Response: Normal blood pressure response.  Clinical Symptoms: There is dyspnea.  ECG Impression: Significant ST abnormalities consistent with ischemia.  Comparison with Prior Nuclear Study: No images to compare  Overall Impression: Low risk stress nuclear study with ECG changes but normal perfusion.  LV Ejection Fraction: 85%. LV Wall Motion: NL LV Function; NL Wall Motion  Olga Millers   Transthoracic Echocardiography  Patient: Melinda [name], Melinda [name] MR #: [id number] Study Date: 06/19/2012 Gender: F Age: 52 Height: 167.6cm Weight: 78.5kg BSA: 1.20m^2 Pt. Status: Room:  Mike Gip ATTENDING Default, Provider W Rochele Pages SONOGRAPHER Aida Raider, RDCS PERFORMING [facility], Site 3 cc:  ------------------------------------------------------------ LV EF: 55% - 60%  ------------------------------------------------------------ Indications: Chest pain 786.51.  ------------------------------------------------------------ History: PMH: Acquired from the patient and from the patient's chart. PMH: Palpitations. Risk factors:  Former tobacco use. Hypertension. Diabetes mellitus. Dyslipidemia.  ------------------------------------------------------------ Study Conclusions  - Left ventricle: The cavity size was normal. Systolic function was normal. The estimated ejection fraction was in the range of 55% to 60%. Wall motion was normal; there were no regional wall motion abnormalities. - Left atrium: The atrium was mildly dilated.  ------------------------------------------------------------ Labs, prior tests, procedures, and surgery: Breast Reduction Surgery. Kidney Surgery. Transthoracic echocardiography. M-mode, complete 2D, spectral Doppler, and color Doppler. Height: Height: 167.6cm. Height: 66in. Weight: Weight: 78.5kg. Weight: 172.6lb. Body mass index: BMI: 27.9kg/m^2. Body surface area: BSA: 1.50m^2. Blood pressure: 120/82. Patient status: Outpatient. Location: [facility]  ------------------------------------------------------------  ------------------------------------------------------------ Left ventricle: The cavity size was normal. Systolic function was normal. The estimated ejection fraction was in the range of 55% to 60%. Wall motion was normal; there were no regional wall motion abnormalities.  ------------------------------------------------------------ Aortic valve: Trileaflet; normal thickness leaflets. Mobility was not restricted. Doppler: Transvalvular velocity was within the normal range. There was no stenosis. No regurgitation.  ------------------------------------------------------------ Aorta: Aortic root: The aortic root was normal in size.  ------------------------------------------------------------ Mitral valve: Structurally normal valve. Mobility was not restricted. Doppler: Transvalvular velocity was within the normal range. There was no evidence for stenosis. Trivial regurgitation. Peak gradient: 2mm Hg  (D).  ------------------------------------------------------------ Left atrium: The atrium was mildly dilated.  ------------------------------------------------------------ Right ventricle: The cavity size was normal. Wall thickness was normal. Systolic function was normal.  ------------------------------------------------------------ Pulmonic valve: Doppler: Transvalvular velocity was within the normal range. There was no evidence for stenosis.  ------------------------------------------------------------ Tricuspid valve: Structurally normal valve. Doppler: Transvalvular velocity was within the normal range. No regurgitation.  ------------------------------------------------------------ Pulmonary artery: The main pulmonary artery was normal-sized. Systolic pressure was within the normal range.  ------------------------------------------------------------ Right atrium: The atrium was normal in size.  ------------------------------------------------------------ Pericardium: There was no pericardial effusion.  ------------------------------------------------------------ Systemic veins: Inferior vena cava: The vessel was normal in size.  ------------------------------------------------------------  2D measurements Normal Doppler measurements Normal Left ventricle Left ventricle LVID ED, 41.5 mm 43-52 Ea, lat 9.6 cm/s ------ chord, ann, tiss 5 PLAX DP LVID ES, 27.1 mm 23-38 E/Ea, lat 7.5 ------ chord, ann, tiss  2 PLAX DP FS, chord, 35 % >29 Ea, med 9.7 cm/s ------ PLAX ann, tiss 6 LVPW, ED 10.2 mm ------ DP IVS/LVPW 0.73 <1.3 E/Ea, med 7.4 ------ ratio, ED ann, tiss 4 Ventricular septum DP IVS, ED 7.45 mm ------ LVOT LVOT Peak vel, S 113 cm/s ------ Diam, S 17 mm ------ VTI, S 22. cm ------ Area 2.27 cm^2 ------ 1 Diam 17 mm ------ Peak 5 mm Hg ------ Aorta gradient, S Root diam, 29 mm ------ Stroke vol 50. ml ------ ED 2 Left atrium Stroke 26. ml/m^2 ------ AP  dim 42 mm ------ index 7 AP dim 2.23 cm/m^2 <2.2 Mitral valve index Peak E vel 72. cm/s ------ 6 Peak A vel 89. cm/s ------ 3 Deceleratio 222 ms 150-23 n time 0 Peak 2 mm Hg ------ gradient, D Peak E/A 0.8 ------ ratio Right ventricle Sa vel, lat 18. cm/s ------ ann, tiss 2 DP  ------------------------------------------------------------ Prepared and Electronically Authenticated by  Charlton Haws 2014-01-13T17:04:43.467    Assessment / Plan:  1. Presyncope -etiology is unclear. History of positive tilt table test in the past. Echo and Holter are normal. Because of abnormal Ecg on stress test Dr. [name] felt more definitive evaluation of her coronary status was in order. I fully discussed options of cardiac cath versus coronary CTA with the patient and she was agreeable to proceed with cath. Her pre cath labs, however, demonstrate a new microcytic anemia. This may be exacerbating or causing her symptoms so I have recommended this be evaluated by her primary physician. If her symptoms persist despite correction of her anemia further cardiac evaluation would be reasonable. I think it is most likely that her Ecg changes are falsely positive especially with normal perfusion imaging.

## 2012-06-22 NOTE — Telephone Encounter (Signed)
Spoke to patient was told Dr.Stuckey and his nurse out of office until Monday 06/26/12.Advised she needs to start iron as prescribed by her PCP.Advised will send message to Dr.Stuckey's nurse to see if see needs to keep appointment with Dr.Stuckey 06/26/12.

## 2012-06-22 NOTE — Telephone Encounter (Addendum)
Pt's blood was too low, so heart cath was cxl, and to call dr Holley Bouche her pcp , they put her on a iron pill and told her to have the blood checked again in 4 weeks, she doesn't thinks dr [name] would wont her to wait that long to have the cath done, also has an appt with dr [name] 06-26-12 wants to know if she needs to keep that? pls adivise

## 2012-06-23 ENCOUNTER — Encounter (HOSPITAL_BASED_OUTPATIENT_CLINIC_OR_DEPARTMENT_OTHER): Admission: RE | Payer: Self-pay | Source: Ambulatory Visit

## 2012-06-23 ENCOUNTER — Inpatient Hospital Stay (HOSPITAL_BASED_OUTPATIENT_CLINIC_OR_DEPARTMENT_OTHER)
Admission: RE | Admit: 2012-06-23 | Payer: Managed Care, Other (non HMO) | Source: Ambulatory Visit | Admitting: Cardiology

## 2012-06-23 SURGERY — JV LEFT HEART CATHETERIZATION WITH CORONARY ANGIOGRAM
Anesthesia: Moderate Sedation

## 2012-06-26 ENCOUNTER — Encounter: Payer: Self-pay | Admitting: Cardiology

## 2012-06-26 ENCOUNTER — Ambulatory Visit (INDEPENDENT_AMBULATORY_CARE_PROVIDER_SITE_OTHER): Payer: Managed Care, Other (non HMO) | Admitting: Cardiology

## 2012-06-26 VITALS — BP 113/79 | HR 93 | Ht 66.0 in | Wt 175.0 lb

## 2012-06-26 DIAGNOSIS — D509 Iron deficiency anemia, unspecified: Secondary | ICD-10-CM

## 2012-06-26 DIAGNOSIS — R002 Palpitations: Secondary | ICD-10-CM

## 2012-06-26 DIAGNOSIS — R55 Syncope and collapse: Secondary | ICD-10-CM

## 2012-06-26 DIAGNOSIS — R079 Chest pain, unspecified: Secondary | ICD-10-CM | POA: Insufficient documentation

## 2012-06-26 NOTE — Telephone Encounter (Signed)
The pt was seen by Dr [name] 06/26/12.

## 2012-06-26 NOTE — Patient Instructions (Signed)
Your physician recommends that you schedule a follow-up appointment in: 3 WEEKS with Dr [name]  Your physician recommends that you continue on your current medications as directed. Please refer to the Current Medication list given to you today.  Please schedule a follow-up appointment with Dr Tiburcio Pea for follow-up of anemia.

## 2012-06-26 NOTE — Assessment & Plan Note (Signed)
This is clearly new and rather marked. My suspicion is that she is having decreased volume related neurally mediated events, and that these could be potentially causing her symptoms. It is possible that the anemia has brought out underlying cardiac issues. I will try to touch base with her primary care physician, so that we can put together a plan and will address all these issues.  I would be reluctant to proceed with cardiac workup in the absence of resolution of the anemia as it seems likely that this is the precipitant.  This was reviewed in detail today with the patient and her husband

## 2012-06-26 NOTE — Progress Notes (Signed)
HPI:  The patient returns today in a followup visit. She was recently seen by my colleague Dr. [place]and and the original plan was to consider cardiac catheterization. However, the patient was noted to have a rather profound microcytic anemia. She subtotally found out that a CBC done the week before also demonstrated a significant anemia, and she notes that she had a hemoglobin of 10 back in the fall. She had previous hemoglobin the year before that was over 13. The patient has a known history of neurally mediated syncope. More recently, she's had some recurrent episodes of near syncope.  Are somewhat similar to what she's had in the past. She did stool cards and turned and then last week, and they are anticipating some results in the next day or 2. She denies dark bowel movements but does admit to some fatigue.  Patient's had prior hysterectomy, and is also had a prior GI evaluation by Dr. Randa Evens.  Here is the prior findings:  DATE OF PROCEDURE: 05/08/2002  DATE OF DISCHARGE:  OPERATIVE REPORT  PROCEDURE: Esophagogastroduodenoscopy.  ENDOSCOPIST: [facility] Aliment. Randa Evens, M.D.  INDICATIONS FOR PROCEDURE: The patient has had nausea, and has been kept on  Nexium which has helped her heartburn, but not her nausea. She has had a  history of reflux. We worked up her gallbladder. She had sludge in her  gallbladder and delayed emptying on a hepatobiliary scan. She is being  considered for a cholecystectomy. She also is apparently seeing Dr. Shan Levans for some pulmonary problems and a chronic cough. Whether all of this  is due to her reflux or due to biliary disease. She has not had an  endoscopy in over 10 years, and for this reason an endoscopy is performed.  DESCRIPTION OF PROCEDURE: The procedure had been explained to the patient  and a consent obtained. The patient was placed in the left lateral  decubitus position, and the Olympus scope was inserted and advanced. The  stomach was entered. The  pylorus was identified and passed. The duodenum,  including the bulb and second portion, were entirely unremarkable. The  scope was drawn back into the stomach. The pyloric channel and the antrum  were normal. The fundus and cardia were well on the retroflexed view and  produced no evidence of ulceration, or erosions, etc. The distal esophagus  was examined. There was no significant hiatal hernia. The distal esophagus  was somewhat reddened and consistent with mild reflux. The proximal  esophagus was normal. The scope was withdrawn.  The patient tolerated the procedure well and was maintained on low-flow  oxygen and pulse oximeter throughout the procedure.  ASSESSMENT: Mild reflux as manifested by erythema in the esophagus. No  ulcerations, no Barrett's, or significant hiatal hernia.  PLAN: The patient will see Dr. Sheppard Plumber. Davis in the office next week for  consideration for a cholecystectomy.  James L. Malon Kindle., M.D.  Waldron Session D: 05/08/2002 T: 05/08/2002 Job: [id number]    Current Outpatient Prescriptions  Medication Sig Dispense Refill  . aspirin 81 MG tablet Take 81 mg by mouth daily.        . clonazePAM (KLONOPIN) 0.5 MG tablet Take 1 1/2 tablet  in the morning and 1 tablet at night      . FLUoxetine (PROZAC) 20 MG capsule Take 1 tablet by mouth daily.      . IRON PO Take 150 mg by mouth daily.      [name] NEXIUM 40 MG capsule Take 1 capsule  by mouth daily at 6 (six) AM.      . nitroGLYCERIN (NITROSTAT) 0.4 MG SL tablet Place 1 tablet (0.4 mg total) under the tongue every 5 (five) minutes as needed for chest pain.  25 tablet  3  . propranolol (INDERAL) 10 MG tablet Take 10 mg by mouth once.       . rosuvastatin (CRESTOR) 5 MG tablet Take 5 mg by mouth daily.        Allergies  Allergen Reactions  . Antihistamines, Diphenhydramine-Type   . Ceclor (Cefaclor)   . Cephalexin     Kef tabs only (maybe dye)  . Ciprofloxacin   . Codeine   . Doxycycline Calcium   . Erythromycin   .  Flagyl (Metronidazole Hcl)   . Floxin (Ofloxacin)   . Macrodantin   . Prednisolone   . Promethazine Hcl   . Tetracyclines & Related     Past Medical History  Diagnosis Date  . Hypertension   . Dyslipidemia     untreated  . Family history of early CAD   . GERD (gastroesophageal reflux disease)   . Palpitation   . DM (diabetes mellitus)   . Microcytic anemia 06/22/2012    Past Surgical History  Procedure Date  . Cardiac catheterization 2002    normal LV function and no significant coronary obstruction  . Tonsillectomy   . Kidney surgery   . Appendectomy   . Partial hysterectomy   . Breast reduction surgery     Family History  Problem Relation Age of Onset  . Coronary artery disease      Fx of  . Heart attack    . Diabetes    . Heart disease Mother   . Liver disease Mother   . Heart disease Father   . Heart attack Father     History   Social History  . Marital Status: Married    Spouse Name: N/A    Number of Children: 1  . Years of Education: N/A   Occupational History  . [employer]    Social History Main Topics  . Smoking status: Former Smoker    Quit date: 07/08/2010  . Smokeless tobacco: Not on file     Comment: uses electronic  . Alcohol Use: No  . Drug Use: No  . Sexually Active: Yes   Other Topics Concern  . Not on file   Social History Narrative  . No narrative on file    ROS: Please see the HPI.  All other systems reviewed and negative.  PHYSICAL EXAM:  BP 113/79  Pulse 93  Ht 5\' 6"  (1.676 m)  Wt 175 lb (79.379 kg)  BMI 28.25 kg/m2  SpO2 98% Othostatics done today without change.    General: Well developed, well nourished, in no acute distress. Head:  Normocephalic and atraumatic. Neck: no JVD Lungs: Clear to auscultation and percussion. Heart: Normal S1 and S2.  No murmur, rubs or gallops.  Pulses: Pulses normal in all 4 extremities. Extremities: No clubbing or cyanosis. No edema. Neurologic: Alert and  oriented x 3.  EKG:  ASSESSMENT AND PLAN:

## 2012-06-26 NOTE — Assessment & Plan Note (Signed)
Seems to have settled down a bit.  Will need to try to resolve anemia before further workup.

## 2012-06-26 NOTE — Assessment & Plan Note (Addendum)
Concerned over etiology.  ? Source.  Prior tilt test pos, cannot find report, but by history.  Possibly worsened by volume contraction.

## 2012-06-26 NOTE — Assessment & Plan Note (Signed)
No current symptoms

## 2012-06-29 ENCOUNTER — Telehealth: Payer: Self-pay | Admitting: Cardiology

## 2012-06-29 NOTE — Telephone Encounter (Signed)
GI study's will be done on 2/5 and she wanted to let Dr. [name] aware and Dr. Norval Morton is doing it

## 2012-06-29 NOTE — Telephone Encounter (Signed)
Spoke with pt, she will be having an endoscopy and colonoscopy on 07-12-12. Will make dr [name] aware.

## 2012-06-30 NOTE — Telephone Encounter (Signed)
Dr [name] aware.

## 2012-07-12 ENCOUNTER — Other Ambulatory Visit: Payer: Self-pay | Admitting: Gastroenterology

## 2012-07-14 ENCOUNTER — Telehealth: Payer: Self-pay | Admitting: Cardiology

## 2012-07-14 NOTE — Telephone Encounter (Signed)
I spoke with the pt and she was evaluated by Dr Tiburcio Pea and then referred to Dr Randa Evens for GI work up.  The pt did have an Upper and Lower Endoscopy this week and they took polyps and found hemorrhoids.  I will obtain notes from Dr Randa Evens office and Dr [name] will contact the pt.

## 2012-07-14 NOTE — Telephone Encounter (Signed)
New Problem    Pt would like to speak to Dr. Allena Katz about moving forward with cath or possibly seeing about getting some blood in order to do cath treatment.

## 2012-07-19 ENCOUNTER — Ambulatory Visit: Payer: Managed Care, Other (non HMO) | Admitting: Cardiology

## 2012-07-20 NOTE — Telephone Encounter (Signed)
See my note below.  TS

## 2012-07-20 NOTE — Telephone Encounter (Signed)
Called and spoke with patient.  She is feeling better.  She is to see me again on Tuesday after snow cancellations, so will re-discuss at that time.

## 2012-07-25 ENCOUNTER — Ambulatory Visit (INDEPENDENT_AMBULATORY_CARE_PROVIDER_SITE_OTHER): Payer: Managed Care, Other (non HMO) | Admitting: Cardiology

## 2012-07-25 ENCOUNTER — Encounter: Payer: Self-pay | Admitting: Cardiology

## 2012-07-25 ENCOUNTER — Other Ambulatory Visit: Payer: Self-pay | Admitting: *Deleted

## 2012-07-25 VITALS — BP 114/72 | HR 95 | Ht 67.0 in | Wt 178.0 lb

## 2012-07-25 DIAGNOSIS — R079 Chest pain, unspecified: Secondary | ICD-10-CM

## 2012-07-25 DIAGNOSIS — D509 Iron deficiency anemia, unspecified: Secondary | ICD-10-CM

## 2012-07-25 DIAGNOSIS — I10 Essential (primary) hypertension: Secondary | ICD-10-CM

## 2012-07-25 DIAGNOSIS — R55 Syncope and collapse: Secondary | ICD-10-CM

## 2012-07-25 DIAGNOSIS — E785 Hyperlipidemia, unspecified: Secondary | ICD-10-CM

## 2012-07-25 NOTE — Progress Notes (Signed)
HPI:  This nice lady seen today in a followup visit. From a cardiac standpoint she still feels, fatigue as well as shortness of breath with exertion, particularly when she just gets up and moves about. Interestingly, she was noted to have rather profound microcytic anemia, and this was new from a year ago. Has subsequently undergone endoscopy, and we did not have the results of this, but apparently there were no clinical findings to clearly demonstrated the reason for her anemia.  She is scheduled for follow up with Dr. Randa Evens.  She still feels dizzy at times, and her symptoms are perhaps slightly better.    Current Outpatient Prescriptions  Medication Sig Dispense Refill  . aspirin 81 MG tablet Take 81 mg by mouth daily.        . clonazePAM (KLONOPIN) 0.5 MG tablet Take 1 1/2 tablet  in the morning and 1 tablet at night      . FLUoxetine (PROZAC) 20 MG capsule Take 1 tablet by mouth daily.      . IRON PO Take 150 mg by mouth daily.      [name] NEXIUM 40 MG capsule Take 1 capsule by mouth daily at 6 (six) AM.      . nitroGLYCERIN (NITROSTAT) 0.4 MG SL tablet Place 0.4 mg under the tongue every 5 (five) minutes as needed for chest pain.      [name] propranolol (INDERAL) 10 MG tablet Take 10 mg by mouth once.       . rosuvastatin (CRESTOR) 5 MG tablet Take 5 mg by mouth daily.       No current facility-administered medications for this visit.    Allergies  Allergen Reactions  . Antihistamines, Diphenhydramine-Type   . Ceclor (Cefaclor)   . Cephalexin     Kef tabs only (maybe dye)  . Ciprofloxacin   . Codeine   . Doxycycline Calcium   . Erythromycin   . Flagyl (Metronidazole Hcl)   . Floxin (Ofloxacin)   . Macrodantin   . Prednisolone   . Promethazine Hcl   . Tetracyclines & Related     Past Medical History  Diagnosis Date  . Hypertension   . Dyslipidemia     untreated  . Family history of early CAD   . GERD (gastroesophageal reflux disease)   . Palpitation   . DM (diabetes  mellitus)   . Microcytic anemia 06/22/2012    Past Surgical History  Procedure Laterality Date  . Cardiac catheterization  2002    normal LV function and no significant coronary obstruction  . Tonsillectomy    . Kidney surgery    . Appendectomy    . Partial hysterectomy    . Breast reduction surgery      Family History  Problem Relation Age of Onset  . Coronary artery disease      Fx of  . Heart attack    . Diabetes    . Heart disease Mother   . Liver disease Mother   . Heart disease Father   . Heart attack Father     History   Social History  . Marital Status: Married    Spouse Name: N/A    Number of Children: 1  . Years of Education: N/A   Occupational History  . [employer]    Social History Main Topics  . Smoking status: Former Smoker    Quit date: 07/08/2010  . Smokeless tobacco: Not on file     Comment: uses electronic  .  Alcohol Use: No  . Drug Use: No  . Sexually Active: Yes   Other Topics Concern  . Not on file   Social History Narrative  . No narrative on file    ROS: Please see the HPI.  All other systems reviewed and negative.  PHYSICAL EXAM:  BP 114/72  Pulse 95  Ht 5\' 7"  (1.702 m)  Wt 178 lb (80.74 kg)  BMI 27.87 kg/m2  SpO2 99%  General: Well developed, well nourished, in no acute distress. Head:  Normocephalic and atraumatic. Neck: no JVD Lungs: Clear to auscultation and percussion. Heart: Normal S1 and S2.  No murmur, rubs or gallops.  Abdomen:  Normal bowel sounds; soft; non tender; no organomegaly Pulses: Pulses normal in all 4 extremities. Extremities: No clubbing or cyanosis. No edema. Neurologic: Alert and oriented x 3.  EKG:  ASSESSMENT AND PLAN:

## 2012-07-25 NOTE — Patient Instructions (Addendum)
Your physician recommends that you return for lab work: CBC, Iron, BMP, LIVER, PT/INR, PTT, Ferritin, TIBC ([place] office across from [place] [place] in the basement--7:30-4:30)  Your physician recommends that you continue on your current medications as directed. Please refer to the Current Medication list given to you today.

## 2012-07-26 ENCOUNTER — Other Ambulatory Visit (INDEPENDENT_AMBULATORY_CARE_PROVIDER_SITE_OTHER): Payer: Managed Care, Other (non HMO)

## 2012-07-26 DIAGNOSIS — D509 Iron deficiency anemia, unspecified: Secondary | ICD-10-CM

## 2012-07-26 LAB — PROTIME-INR: Prothrombin Time: 11 s (ref 10.2–12.4)

## 2012-07-26 LAB — CBC WITH DIFFERENTIAL/PLATELET
Basophils Relative: 1.2 % (ref 0.0–3.0)
Eosinophils Relative: 3.9 % (ref 0.0–5.0)
Lymphocytes Relative: 40.3 % (ref 12.0–46.0)
MCV: 64.8 fl — ABNORMAL LOW (ref 78.0–100.0)
Monocytes Absolute: 0.4 10*3/uL (ref 0.1–1.0)
Neutrophils Relative %: 48.6 % (ref 43.0–77.0)
RBC: 3.97 Mil/uL (ref 3.87–5.11)
WBC: 6.6 10*3/uL (ref 4.5–10.5)

## 2012-07-26 LAB — HEPATIC FUNCTION PANEL
ALT: 28 U/L (ref 0–35)
AST: 28 U/L (ref 0–37)
Alkaline Phosphatase: 68 U/L (ref 39–117)
Bilirubin, Direct: 0 mg/dL (ref 0.0–0.3)
Total Bilirubin: 0.4 mg/dL (ref 0.3–1.2)
Total Protein: 7.3 g/dL (ref 6.0–8.3)

## 2012-07-26 LAB — BASIC METABOLIC PANEL
CO2: 26 mEq/L (ref 19–32)
Chloride: 105 mEq/L (ref 96–112)
Creatinine, Ser: 0.8 mg/dL (ref 0.4–1.2)
Potassium: 3.8 mEq/L (ref 3.5–5.1)

## 2012-07-26 LAB — IRON: Iron: 21 ug/dL — ABNORMAL LOW (ref 42–145)

## 2012-07-26 LAB — FERRITIN: Ferritin: 3.9 ng/mL — ABNORMAL LOW (ref 10.0–291.0)

## 2012-07-27 LAB — IRON AND TIBC
TIBC: 466 ug/dL (ref 250–470)
UIBC: 448 ug/dL — ABNORMAL HIGH (ref 125–400)

## 2012-07-28 ENCOUNTER — Other Ambulatory Visit: Payer: Self-pay

## 2012-08-01 ENCOUNTER — Telehealth: Payer: Self-pay | Admitting: Oncology

## 2012-08-01 NOTE — Telephone Encounter (Signed)
LVOM for pt to return cal.  °

## 2012-08-01 NOTE — Telephone Encounter (Signed)
S/W pt in re NP appt 03/10 @ 10:30 w/Dr. Gaylyn Rong.  Referring Dr. Arvella Merles Dx- Anemia/IDA Welcome packet mailed.

## 2012-08-03 ENCOUNTER — Telehealth: Payer: Self-pay | Admitting: Oncology

## 2012-08-03 ENCOUNTER — Other Ambulatory Visit (HOSPITAL_COMMUNITY): Payer: Self-pay | Admitting: *Deleted

## 2012-08-03 NOTE — Telephone Encounter (Signed)
C/D 08/03/12 for appt. 08/14/12

## 2012-08-04 ENCOUNTER — Encounter (HOSPITAL_COMMUNITY)
Admission: RE | Admit: 2012-08-04 | Discharge: 2012-08-04 | Disposition: A | Discharge status: Home / Self Care | Payer: Managed Care, Other (non HMO) | Source: Ambulatory Visit | Attending: Gastroenterology | Admitting: Gastroenterology

## 2012-08-04 DIAGNOSIS — D509 Iron deficiency anemia, unspecified: Secondary | ICD-10-CM | POA: Insufficient documentation

## 2012-08-04 MED ORDER — SODIUM CHLORIDE 0.9 % IV SOLN
25.0000 mg | Freq: Once | INTRAVENOUS | Status: AC
  Administered 2012-08-04: 25 mg via INTRAVENOUS
  Filled 2012-08-04: qty 0.5

## 2012-08-04 MED ORDER — SODIUM CHLORIDE 0.9 % IV SOLN
500.0000 mg | Freq: Once | INTRAVENOUS | Status: AC
  Administered 2012-08-04: 500 mg via INTRAVENOUS
  Filled 2012-08-04: qty 10

## 2012-08-04 NOTE — Assessment & Plan Note (Signed)
Sounds neurally mediated----significant anemia from before so volume could be an issue.  Not a clearcut cardiac etiology otherwise.

## 2012-08-04 NOTE — Assessment & Plan Note (Signed)
Not at present

## 2012-08-04 NOTE — Assessment & Plan Note (Signed)
Source of bleeding not found.  Symptoms still could be from anemia. Therefore, recheck CBC.

## 2012-08-04 NOTE — Assessment & Plan Note (Signed)
Based on exercise test, likely may need cath.  However, issue of anemia needs to be resolved either through treatment or possibly iron infusion.  Await labs.

## 2012-08-04 NOTE — Assessment & Plan Note (Signed)
Followed by her primary MD.

## 2012-08-07 ENCOUNTER — Telehealth: Payer: Self-pay | Admitting: Cardiology

## 2012-08-07 DIAGNOSIS — D509 Iron deficiency anemia, unspecified: Secondary | ICD-10-CM

## 2012-08-07 NOTE — Telephone Encounter (Signed)
I spoke with the pt and she had an iron transfusion on 08/04/12.  The pt states that since her transfusion she had an increase in palpitations and when she stands up she can feel her heart beat in her head. The pt is also aching in her knees and back. At this time the pt is scheduled to see Hematology on 08/14/12. I will discuss this pt with Dr [name].

## 2012-08-07 NOTE — Telephone Encounter (Signed)
I called and spoke with patient.  SHe had some symptoms after iron infusion.  She has had some palpitations.  She is scheduled to see Dr. Gaylyn Rong on 3/10 and [place] the following week.  She has not had fu CBC.  We will try to arrange.

## 2012-08-07 NOTE — Telephone Encounter (Signed)
New Problem:    Patient called in because Dr. Randa Evens ordered an iron infusion and she is now have palpatations that she is feeling into her head.  Please call back.

## 2012-08-09 NOTE — Addendum Note (Signed)
Addended by: Iona Coach on: 08/09/2012 11:12 AM   Modules accepted: Orders

## 2012-08-09 NOTE — Telephone Encounter (Signed)
Pt scheduled for CBC on 08/10/12.

## 2012-08-10 ENCOUNTER — Other Ambulatory Visit (INDEPENDENT_AMBULATORY_CARE_PROVIDER_SITE_OTHER): Payer: Managed Care, Other (non HMO)

## 2012-08-10 DIAGNOSIS — D509 Iron deficiency anemia, unspecified: Secondary | ICD-10-CM

## 2012-08-10 DIAGNOSIS — R0989 Other specified symptoms and signs involving the circulatory and respiratory systems: Secondary | ICD-10-CM

## 2012-08-14 ENCOUNTER — Telehealth: Payer: Self-pay | Admitting: Oncology

## 2012-08-14 ENCOUNTER — Ambulatory Visit (HOSPITAL_BASED_OUTPATIENT_CLINIC_OR_DEPARTMENT_OTHER): Payer: Managed Care, Other (non HMO) | Admitting: Oncology

## 2012-08-14 ENCOUNTER — Encounter: Payer: Self-pay | Admitting: Oncology

## 2012-08-14 ENCOUNTER — Other Ambulatory Visit (HOSPITAL_BASED_OUTPATIENT_CLINIC_OR_DEPARTMENT_OTHER): Payer: Managed Care, Other (non HMO) | Admitting: Lab

## 2012-08-14 ENCOUNTER — Ambulatory Visit (HOSPITAL_BASED_OUTPATIENT_CLINIC_OR_DEPARTMENT_OTHER): Payer: Managed Care, Other (non HMO)

## 2012-08-14 VITALS — BP 121/76 | HR 88 | Temp 97.2°F | Resp 18 | Ht 67.0 in | Wt 176.5 lb

## 2012-08-14 DIAGNOSIS — D539 Nutritional anemia, unspecified: Secondary | ICD-10-CM

## 2012-08-14 DIAGNOSIS — D509 Iron deficiency anemia, unspecified: Secondary | ICD-10-CM

## 2012-08-14 LAB — CBC WITH DIFFERENTIAL/PLATELET
BASO%: 1.2 % (ref 0.0–2.0)
HCT: 32.9 % — ABNORMAL LOW (ref 34.8–46.6)
LYMPH%: 37.8 % (ref 14.0–49.7)
MCH: 21.1 pg — ABNORMAL LOW (ref 25.1–34.0)
MCHC: 30.5 g/dL — ABNORMAL LOW (ref 31.5–36.0)
MCV: 69.2 fL — ABNORMAL LOW (ref 79.5–101.0)
MONO#: 0.5 10*3/uL (ref 0.1–0.9)
MONO%: 6 % (ref 0.0–14.0)
NEUT%: 50.7 % (ref 38.4–76.8)
Platelets: 261 10*3/uL (ref 145–400)

## 2012-08-14 NOTE — Progress Notes (Signed)
Checked in new patient. No financial issues. °

## 2012-08-14 NOTE — Progress Notes (Signed)
[facility]  [phone] [phone]     INITIAL HEMATOLOGY CONSULTATION    Referral MD:  Dr. Johny Blamer, M.D.  Reason for Referral: iron deficiency anemia.     HPI:  [name] is a 52 year-old woman with no significant PMH.  She noticed 6-9 months of generalized fatigue, faint,presyncopal.  Per pt's history, anemia since 09/2011.  Her CBC in 2012 showed no anemia.  05/2012:  Told PCP.  Saw Dr. [name].  Cardiology work up:  Holter negative.  Stress test:  Positive.  Heart cath planned.   Precath CBC showed anemia.  GI referral.  EGD/colonsocopy:  Neg including no Celiac.  Got IV iron (Venofer?) with GI about 10 days ago.  She complained of bloating/ abdominal cramp.  Presyncpe improved.  She was kindly referred to the [facility] for evaluation.  [name] presented to the Cancer Center for the first time today with her husband.  She complained of mild SOB and DOE walking up stairs.  She has no problem ambulating on level street. She still has some dizziness.  However, these symptoms have significantly improved compared to before IV iron.  She takes oral iron once daily.  She feels bloated since starting oral iron.  She denied nausea/vomiting, constipation.  She denied all visible source of bleeding.  Patient denies fever, anorexia, weight loss, headache, visual changes, confusion, drenching night sweats, palpable lymph node swelling, mucositis, odynophagia, dysphagia, nausea vomiting, jaundice, productive cough, gum bleeding, epistaxis, hematemesis, hemoptysis, abdominal swelling, early satiety, melena, hematochezia, hematuria, skin rash, spontaneous bleeding, joint swelling, joint pain, heat or cold intolerance, bowel bladder incontinence, back pain, focal motor weakness, paresthesia, depression.     Past Medical History  Diagnosis Date  . Hypertension     ?  [name] Dyslipidemia     untreated  . GERD (gastroesophageal reflux disease)    . Palpitation     positive tilt table test.   . Microcytic anemia 06/22/2012  . Diabetes mellitus type 2, controlled   . Nephrolith   :    Past Surgical History  Procedure Laterality Date  . Cardiac catheterization  2002    normal LV function and no significant coronary obstruction  . Tonsillectomy    . Kidney surgery    . Appendectomy    . Partial hysterectomy    . Breast reduction surgery    :   CURRENT MEDS: Current Outpatient Prescriptions  Medication Sig Dispense Refill  . clonazePAM (KLONOPIN) 0.5 MG tablet Take 1 1/2 tablet  in the morning and 1 tablet at night      . FLUoxetine (PROZAC) 20 MG capsule Take 20 mg by mouth daily.      . IRON PO Take 150 mg by mouth daily.      . nitroGLYCERIN (NITROSTAT) 0.4 MG SL tablet Place 0.4 mg under the tongue every 5 (five) minutes as needed for chest pain.      . pantoprazole (PROTONIX) 40 MG tablet Take 40 mg by mouth daily.      . propranolol (INDERAL) 10 MG tablet Take 10 mg by mouth once.       . rosuvastatin (CRESTOR) 5 MG tablet Take 5 mg by mouth daily.       No current facility-administered medications for this visit.      Allergies  Allergen Reactions  . Antihistamines, Diphenhydramine-Type   . Ceclor (Cefaclor)   . Cephalexin     Kef tabs only (maybe dye)  .  Ciprofloxacin   . Codeine   . Doxycycline Calcium   . Erythromycin   . Flagyl (Metronidazole Hcl)   . Floxin (Ofloxacin)   . Macrodantin   . Prednisolone   . Promethazine Hcl   . Tetracyclines & Related   :  Family History  Problem Relation Age of Onset  . Coronary artery disease      Fx of  . Heart attack    . Diabetes    . Heart disease Mother   . Liver disease Mother   . Heart disease Father   . Heart attack Father   . Cancer Father     prostate CA  . Cancer Maternal Grandmother 60    ovarian cancer  :  History   Social History  . Marital Status: Married    Spouse Name: N/A    Number of Children: 1  . Years of Education: N/A     Occupational History  .      not working now.   .     Social History Main Topics  . Smoking status: Former Smoker    Quit date: 07/08/2010  . Smokeless tobacco: Not on file     Comment: uses electronic  . Alcohol Use: No  . Drug Use: No  . Sexually Active: Yes   Other Topics Concern  . Not on file   Social History Narrative  . No narrative on file  :  REVIEW OF SYSTEM:  The rest of the 14-point review of sytem was negative.   Exam: ECOG 1:  General:  well-nourished woman, in no acute distress.  Eyes:  no scleral icterus.  ENT:  There were no oropharyngeal lesions.  Neck was without thyromegaly.  Lymphatics:  Negative cervical, supraclavicular or axillary adenopathy.  Respiratory: lungs were clear bilaterally without wheezing or crackles.  Cardiovascular:  Regular rate and rhythm, S1/S2, without murmur, rub or gallop.  There was no pedal edema.  GI:  abdomen was soft, flat, nontender, nondistended, without organomegaly.  Muscoloskeletal:  no spinal tenderness of palpation of vertebral spine.  Skin exam was without echymosis, petichae.  Neuro exam was nonfocal.  Patient was able to get on and off exam table without assistance.  Gait was normal.  Patient was alerted and oriented.  Attention was good.   Language was appropriate.  Mood was normal without depression.  Speech was not pressured.  Thought content was not tangential.    LABS:  Lab Results  Component Value Date   WBC 8.1 08/14/2012   HGB 10.0* 08/14/2012   HCT 32.9* 08/14/2012   PLT 261 08/14/2012   GLUCOSE 151* 07/26/2012   ALT 28 07/26/2012   AST 28 07/26/2012   NA 138 07/26/2012   K 3.8 07/26/2012   CL 105 07/26/2012   CREATININE 0.8 07/26/2012   BUN 19 07/26/2012   CO2 26 07/26/2012   INR 1.0 07/26/2012     Blood smear review:   I personally reviewed the patient's peripheral blood smear today.  There was anisocytosis.  There was microcytosis.  There were occasional RBC with increased central pallor.  There was no  peripheral blast.  There was no schistocytosis, spherocytosis, target cell, rouleaux formation, tear drop cell.  There was no giant platelets or platelet clumps.      ASSESSMENT AND PLAN:  1.   Iron deficiency anemia from unclear cause.  Most likely poor GI absorption of oral iron.  -  Work up:  I have low suspicion for bone  marrow problem at this time.  In the future, if despite iron repletion, and anemia is still a problem, we may consider further work up.  I advised her to discuss with GI to see when small bowel capsule endoscopy is indicated.   -  Treatment:  She had already had IV iron with improvement of Hgb from 8 to 10 today.  I advised her to continue oral iron.   -   Follow up: Lab in about 3 and 6 weeks and then every 2 months to monitor blood hemoglobin and iron level.  We will space out the visit once blood count improves.    -  Return visit to clinic in about 1 year.   2.  Chest pain with abnormal EKG and stress test:  Most likely there is a component of anemia-induced stress induced.  However, if cardiac cath is indicated by Cardiology, within 3-4 weeks, her Hgb should improve to around 11 which would be considered safe for cardiac cath.    The length of time of the face-to-face encounter was 30 minutes. More than 50% of time was spent counseling and coordination of care.     Thank you for this referral.

## 2012-08-14 NOTE — Telephone Encounter (Signed)
gv and printed appt schedules...pt ok

## 2012-08-14 NOTE — Patient Instructions (Addendum)
1.  Diagnosis:  Iron deficiency anemia from unclear cause. 2.  Treatment:  I recommended IV Iron Feraheme x 1 dose which normally lasts for 6-12 months depending on how brisk the GI bleeding is.  I have low suspicion for bone marrow problem at this time.  In the future, if despite iron repletion, and anemia is still a problem, we may consider further work up.  3.  Follow up:  *  Lab every 2 months to monitor blood hemoglobin and iron level.  We will space out the visit once blood count improves.   *  Follow up with GI to see if there is indication for further work up (small bowel follow through, capsule endoscopy).  4.  Return visit to clinic in about 1 year.

## 2012-08-22 NOTE — Progress Notes (Signed)
Received lab results from [facility]; forwarded to Dr. Gaylyn Rong.

## 2012-08-23 ENCOUNTER — Telehealth: Payer: Self-pay | Admitting: Cardiology

## 2012-08-23 ENCOUNTER — Other Ambulatory Visit: Payer: Self-pay | Admitting: Oncology

## 2012-08-23 NOTE — Telephone Encounter (Signed)
New problem   Pt want to know if she can do a heart cath.

## 2012-08-23 NOTE — Telephone Encounter (Signed)
Called patient who states she would like to have heart cath.  States saw Dr. [name] in the office 06/26/12 and he wanted her to have cath but anemia was too significant.  Patient states her blood levels are now up and she is ready to have the cath. Patient informed that doctor/nurse out of office but will be updated and will return the call w/advice.

## 2012-08-24 NOTE — Telephone Encounter (Signed)
Per Dr [name] this pt needs to be scheduled for an office visit.

## 2012-08-25 NOTE — Telephone Encounter (Signed)
I spoke with the pt and scheduled her to see Dr [name] on 09/06/12.

## 2012-09-04 ENCOUNTER — Encounter: Payer: Self-pay | Admitting: Oncology

## 2012-09-04 ENCOUNTER — Other Ambulatory Visit (HOSPITAL_BASED_OUTPATIENT_CLINIC_OR_DEPARTMENT_OTHER): Payer: Managed Care, Other (non HMO) | Admitting: Lab

## 2012-09-04 DIAGNOSIS — D509 Iron deficiency anemia, unspecified: Secondary | ICD-10-CM

## 2012-09-04 LAB — CBC WITH DIFFERENTIAL/PLATELET
Basophils Absolute: 0.1 10*3/uL (ref 0.0–0.1)
EOS%: 3.9 % (ref 0.0–7.0)
HCT: 35.5 % (ref 34.8–46.6)
HGB: 11.1 g/dL — ABNORMAL LOW (ref 11.6–15.9)
LYMPH%: 38.9 % (ref 14.0–49.7)
MCH: 22.4 pg — ABNORMAL LOW (ref 25.1–34.0)
MONO#: 0.5 10*3/uL (ref 0.1–0.9)
NEUT%: 49.5 % (ref 38.4–76.8)
Platelets: 288 10*3/uL (ref 145–400)
lymph#: 2.9 10*3/uL (ref 0.9–3.3)

## 2012-09-05 ENCOUNTER — Telehealth: Payer: Self-pay | Admitting: *Deleted

## 2012-09-05 NOTE — Telephone Encounter (Signed)
VM left by pt requesting results of lab from yesterday.

## 2012-09-06 ENCOUNTER — Encounter: Payer: Self-pay | Admitting: Cardiology

## 2012-09-06 ENCOUNTER — Ambulatory Visit (INDEPENDENT_AMBULATORY_CARE_PROVIDER_SITE_OTHER): Payer: Managed Care, Other (non HMO) | Admitting: Cardiology

## 2012-09-06 VITALS — BP 110/76 | HR 75 | Wt 176.0 lb

## 2012-09-06 DIAGNOSIS — E785 Hyperlipidemia, unspecified: Secondary | ICD-10-CM

## 2012-09-06 DIAGNOSIS — R55 Syncope and collapse: Secondary | ICD-10-CM

## 2012-09-06 DIAGNOSIS — R079 Chest pain, unspecified: Secondary | ICD-10-CM

## 2012-09-06 DIAGNOSIS — D509 Iron deficiency anemia, unspecified: Secondary | ICD-10-CM

## 2012-09-06 NOTE — Progress Notes (Signed)
HPI:    this very nice patient returns today in a followup visit. She seems to be doing somewhat better, although she still notices intermittently at times, unrelated to exertion some substernal pressure. It should be noted that she had a positive stress test, and that we had intended for her to undergo cardiac catheterization. However it was discovered that she was substantially anemic, and she is subsequently been found to have a marked microcytic anemia. She's been receiving iron infusions, and with this, her symptoms have improved. However the symptoms persist. She has both parents with history of coronary artery disease. Her sister would like her to have additional evaluation, and the patient certainly is amenable to any recommended evaluation. We talked over the various options today, including cardiac catheterization, or CT angiography. Presyncope which she had, presumably related to volume in the setting of anemia, has not seemingly recurred. We had a thorough discussion about all these things today.  Current Outpatient Prescriptions  Medication Sig Dispense Refill  . clonazePAM (KLONOPIN) 0.5 MG tablet Take 1 1/2 tablet  in the morning and 1 tablet at night      . FLUoxetine (PROZAC) 20 MG capsule Take 20 mg by mouth daily.      . IRON PO Take 150 mg by mouth daily.      . nitroGLYCERIN (NITROSTAT) 0.4 MG SL tablet Place 0.4 mg under the tongue every 5 (five) minutes as needed for chest pain.      . pantoprazole (PROTONIX) 40 MG tablet Take 40 mg by mouth daily.      . propranolol (INDERAL) 10 MG tablet Take 10 mg by mouth once.       . rosuvastatin (CRESTOR) 5 MG tablet Take 5 mg by mouth daily.       No current facility-administered medications for this visit.    Allergies  Allergen Reactions  . Antihistamines, Diphenhydramine-Type   . Ceclor (Cefaclor)   . Cephalexin     Kef tabs only (maybe dye)  . Ciprofloxacin   . Codeine   . Doxycycline Calcium   . Erythromycin   . Flagyl  (Metronidazole Hcl)   . Floxin (Ofloxacin)   . Macrodantin   . Prednisolone   . Promethazine Hcl   . Tetracyclines & Related     Past Medical History  Diagnosis Date  . Hypertension     ?  [name] Dyslipidemia     untreated  . GERD (gastroesophageal reflux disease)   . Palpitation     positive tilt table test.   . Microcytic anemia 06/22/2012  . Diabetes mellitus type 2, controlled   . Nephrolith     Past Surgical History  Procedure Laterality Date  . Cardiac catheterization  2002    normal LV function and no significant coronary obstruction  . Tonsillectomy    . Kidney surgery    . Appendectomy    . Partial hysterectomy    . Breast reduction surgery      Family History  Problem Relation Age of Onset  . Coronary artery disease      Fx of  . Heart attack    . Diabetes    . Heart disease Mother   . Liver disease Mother   . Heart disease Father   . Heart attack Father   . Cancer Father     prostate CA  . Cancer Maternal Grandmother 60    ovarian cancer    History   Social History  . Marital Status:  Married    Spouse Name: N/A    Number of Children: 1  . Years of Education: N/A   Occupational History  .      not working now.   .     Social History Main Topics  . Smoking status: Former Smoker    Quit date: 07/08/2010  . Smokeless tobacco: Not on file     Comment: uses electronic  . Alcohol Use: No  . Drug Use: No  . Sexually Active: Yes   Other Topics Concern  . Not on file   Social History Narrative  . No narrative on file    ROS: Please see the HPI.  All other systems reviewed and negative.  PHYSICAL EXAM:  BP 110/76  Pulse 75  Wt 176 lb (79.833 kg)  BMI 27.56 kg/m2  SpO2 96%  General: Well developed, well nourished, in no acute distress. Head:  Normocephalic and atraumatic. Neck: no JVD Lungs: Clear to auscultation and percussion. Heart: Normal S1 and S2.  No murmur, rubs or gallops.  Pulses: Pulses normal in all 4  extremities. Extremities: No clubbing or cyanosis. No edema. Neurologic: Alert and oriented x 3.  EKG:  NSR.  WNL.  ASSESSMENT AND PLAN:  This is a somewhat difficult situation. The patient still has some intermittent chest pain, but it is improved.  Her original stress test was positive for ST depression, but occurred during anemia.  Her symptoms are improved.  However, with these findings she would like further evaluation as her findings could be seen with multivessel disease.  I think we could accomplish this with CT angiography without the risk of cardiac cath if her insurer will allow this.  Based on her stress test, they may force her to have a cath but we will try to arrange the former.  Before that, I will see her back in follow up in two weeks and she will continue on iron as her hemoglobin has risen from 8 up to just over 11.  She remains microcytic.

## 2012-09-06 NOTE — Patient Instructions (Addendum)
Your physician recommends that you continue on your current medications as directed. Please refer to the Current Medication list given to you today.  Follow-up with Dr. [name] in 2 weeks

## 2012-09-07 ENCOUNTER — Other Ambulatory Visit: Payer: Self-pay | Admitting: Oncology

## 2012-09-07 ENCOUNTER — Telehealth: Payer: Self-pay | Admitting: *Deleted

## 2012-09-07 DIAGNOSIS — D509 Iron deficiency anemia, unspecified: Secondary | ICD-10-CM

## 2012-09-07 NOTE — Telephone Encounter (Signed)
Returned pt's call and informed her iron level was not checked this past visit.  It will be checked again on next lab on 4/21.  Reviewed CBC results and informed anemia improving.  Pt verbalized understanding.

## 2012-09-07 NOTE — Telephone Encounter (Signed)
Please inform patient that I did not order iron this last time.  I added on to the lab in April.  Thanks.

## 2012-09-10 NOTE — Assessment & Plan Note (Signed)
Now on iron replacement.

## 2012-09-10 NOTE — Assessment & Plan Note (Signed)
I will see her back in a couple of weeks and we will arrange final disposition.  She might benefit from a CTA of the coronaries.  She does have an abnormal exercise test but she was anemic.  Her sister is pushing her to have some form of imaging, and I think this is appropriate.

## 2012-09-10 NOTE — Assessment & Plan Note (Signed)
Improved since CBC has improved.

## 2012-09-10 NOTE — Assessment & Plan Note (Signed)
She is under the care of her primary MD.

## 2012-09-20 ENCOUNTER — Ambulatory Visit (INDEPENDENT_AMBULATORY_CARE_PROVIDER_SITE_OTHER): Payer: Managed Care, Other (non HMO) | Admitting: Cardiology

## 2012-09-20 ENCOUNTER — Encounter: Payer: Self-pay | Admitting: Cardiology

## 2012-09-20 VITALS — BP 116/82 | HR 82 | Ht 66.0 in | Wt 173.0 lb

## 2012-09-20 DIAGNOSIS — D509 Iron deficiency anemia, unspecified: Secondary | ICD-10-CM

## 2012-09-20 DIAGNOSIS — R0602 Shortness of breath: Secondary | ICD-10-CM

## 2012-09-20 DIAGNOSIS — I10 Essential (primary) hypertension: Secondary | ICD-10-CM

## 2012-09-20 DIAGNOSIS — E785 Hyperlipidemia, unspecified: Secondary | ICD-10-CM

## 2012-09-20 DIAGNOSIS — R079 Chest pain, unspecified: Secondary | ICD-10-CM

## 2012-09-20 NOTE — Progress Notes (Signed)
HPI:  This nice lady is seen back in followup. She originally presented with near-syncope, and also some chest discomfort. She subsequently was noted to have a substantial anemia. She's been evaluated by the gastroenterologists, and also by her primary care physician. [facility], and subsequent restarted on iron. With this, her symptoms have improved somewhat. She continues to have however some chest discomfort. It seems to not be quite as problematic. Prior to this, she underwent stress testing..  Impression  Exercise Capacity: Fair exercise capacity.  BP Response: Normal blood pressure response.  Clinical Symptoms: There is dyspnea.  ECG Impression: Significant ST abnormalities consistent with ischemia.  Comparison with Prior Nuclear Study: No images to compare  Overall Impression: Low risk stress nuclear study with ECG changes but normal perfusion.  LV Ejection Fraction: 85%. LV Wall Motion: NL LV Function; NL Wall Motion    A cardiac catheterization had been planned, but deferred because of her anemia. She and I discussed this in great detail. Her myocardial perfusion images were normal, but she did have significant ST depression. This was accompanied by some symptoms. We therefore think that some evaluation is warranted. We discussed the risks and options with regard to type of evaluation, and the merits and advantages of CT angiography over coronary arteriography. The visit today was to try to straighten this out and come up with the long-term plan  Current Outpatient Prescriptions  Medication Sig Dispense Refill  . clonazePAM (KLONOPIN) 0.5 MG tablet Take 1 1/2 tablet  in the morning and 1 tablet at night      . FLUoxetine (PROZAC) 20 MG capsule Take 20 mg by mouth daily.      . IRON PO Take 150 mg by mouth daily.      . nitroGLYCERIN (NITROSTAT) 0.4 MG SL tablet Place 0.4 mg under the tongue every 5 (five) minutes as needed for chest pain.      . pantoprazole  (PROTONIX) 40 MG tablet Take 40 mg by mouth daily.      . propranolol (INDERAL) 10 MG tablet Take 10 mg by mouth once.       . rosuvastatin (CRESTOR) 5 MG tablet Take 5 mg by mouth daily.       No current facility-administered medications for this visit.    Allergies  Allergen Reactions  . Amoclan (Amoxicillin-Pot Clavulanate)   . Amoxapine And Related   . Antihistamines, Diphenhydramine-Type   . Ceclor (Cefaclor)   . Cephalexin     Kef tabs only (maybe dye)  . Ciprofloxacin   . Codeine   . Doxycycline Calcium   . Erythromycin   . Flagyl (Metronidazole Hcl)   . Floxin (Ofloxacin)   . Macrodantin   . Prednisolone   . Promethazine Hcl   . Tetracyclines & Related     Past Medical History  Diagnosis Date  . Hypertension     ?  [name] Dyslipidemia     untreated  . GERD (gastroesophageal reflux disease)   . Palpitation     positive tilt table test.   . Microcytic anemia 06/22/2012  . Diabetes mellitus type 2, controlled   . Nephrolith     Past Surgical History  Procedure Laterality Date  . Cardiac catheterization  2002    normal LV function and no significant coronary obstruction  . Tonsillectomy    . Kidney surgery    . Appendectomy    . Partial hysterectomy    . Breast reduction surgery  Family History  Problem Relation Age of Onset  . Coronary artery disease      Fx of  . Heart attack    . Diabetes    . Heart disease Mother   . Liver disease Mother   . Heart disease Father   . Heart attack Father   . Cancer Father     prostate CA  . Cancer Maternal Grandmother 60    ovarian cancer    History   Social History  . Marital Status: Married    Spouse Name: N/A    Number of Children: 1  . Years of Education: N/A   Occupational History  .      not working now.   .     Social History Main Topics  . Smoking status: Former Smoker    Quit date: 07/08/2010  . Smokeless tobacco: Not on file     Comment: uses electronic  . Alcohol Use: No  . Drug  Use: No  . Sexually Active: Yes   Other Topics Concern  . Not on file   Social History Narrative  . No narrative on file    ROS: Please see the HPI.  All other systems reviewed and negative.  PHYSICAL EXAM:  BP 116/82  Pulse 82  Ht 5\' 6"  (1.676 m)  Wt 173 lb (78.472 kg)  BMI 27.94 kg/m2  SpO2 97%  General: Well developed, well nourished, in no acute distress. Head:  Normocephalic and atraumatic. Neck: no JVD Lungs: Clear to auscultation and percussion. Heart: Normal S1 and S2.  No murmur, rubs or gallops.  Abdomen:  Normal bowel sounds; soft; non tender; no organomegaly Pulses: Pulses normal in all 4 extremities. Extremities: No clubbing or cyanosis. No edema. Neurologic: Alert and oriented x 3.  EKG:  ASSESSMENT AND PLAN:

## 2012-09-20 NOTE — Patient Instructions (Addendum)
PLEASE FOLLOW UP WITH DR. ROSS IN ABOUT 6 WEEKS  PLEASE SCHEDULE A CT-A DX 786.50, 786.05; TO BE DONE IN THE NEXT 2 WEEKS  BMET BEFORE CT-A

## 2012-09-21 ENCOUNTER — Other Ambulatory Visit (INDEPENDENT_AMBULATORY_CARE_PROVIDER_SITE_OTHER): Payer: Managed Care, Other (non HMO)

## 2012-09-21 DIAGNOSIS — R0602 Shortness of breath: Secondary | ICD-10-CM

## 2012-09-21 LAB — BASIC METABOLIC PANEL
BUN: 20 mg/dL (ref 6–23)
Calcium: 9.5 mg/dL (ref 8.4–10.5)
Creatinine, Ser: 0.8 mg/dL (ref 0.4–1.2)
GFR: 76.2 mL/min (ref >60.00)
Glucose, Bld: 126 mg/dL — ABNORMAL HIGH (ref 70–99)

## 2012-09-23 NOTE — Assessment & Plan Note (Signed)
This is a bit problematic. She had significant ST depression with exercise at 5/2 minutes, but a normal myocardial perfusion imaging study. A CTA would clearly identify whether she has or does not have underlying coronary artery disease, and she certainly fits in this category were this risk assessment might be helpful. Since she is clinically better, if her CTA were normal she would reduce her radiation low, and potentially avoid the risks of her catheterization. Her positive stress test it occurred in the setting of a hemoglobin 8, and so this would be a reasonable initial alternative. Alternatively, she could undergo diagnostic catheterization which would be definitive, and would be fairly low risk, but not no risk. We will see if her insurance provider will provide for CT angios the first option in this special case

## 2012-09-23 NOTE — Assessment & Plan Note (Signed)
This seems to be improving.  Now up to 11 but was as low as 8

## 2012-09-23 NOTE — Assessment & Plan Note (Signed)
Controlled.  

## 2012-09-23 NOTE — Assessment & Plan Note (Signed)
On low dose crestor, managed by Dr. Tiburcio Pea.

## 2012-09-25 ENCOUNTER — Ambulatory Visit (INDEPENDENT_AMBULATORY_CARE_PROVIDER_SITE_OTHER)
Admission: RE | Admit: 2012-09-25 | Discharge: 2012-09-25 | Disposition: A | Discharge status: Home / Self Care | Payer: Managed Care, Other (non HMO) | Source: Ambulatory Visit | Attending: Cardiology | Admitting: Cardiology

## 2012-09-25 ENCOUNTER — Other Ambulatory Visit (HOSPITAL_BASED_OUTPATIENT_CLINIC_OR_DEPARTMENT_OTHER): Payer: Managed Care, Other (non HMO) | Admitting: Lab

## 2012-09-25 ENCOUNTER — Encounter: Payer: Self-pay | Admitting: Lab

## 2012-09-25 DIAGNOSIS — R079 Chest pain, unspecified: Secondary | ICD-10-CM

## 2012-09-25 DIAGNOSIS — D509 Iron deficiency anemia, unspecified: Secondary | ICD-10-CM

## 2012-09-25 DIAGNOSIS — R0602 Shortness of breath: Secondary | ICD-10-CM

## 2012-09-25 LAB — CBC WITH DIFFERENTIAL/PLATELET
BASO%: 1.5 % (ref 0.0–2.0)
EOS%: 3.8 % (ref 0.0–7.0)
HCT: 36.9 % (ref 34.8–46.6)
MCH: 22.9 pg — ABNORMAL LOW (ref 25.1–34.0)
MCHC: 31.4 g/dL — ABNORMAL LOW (ref 31.5–36.0)
NEUT%: 46 % (ref 38.4–76.8)
RBC: 5.08 10*6/uL (ref 3.70–5.45)
RDW: 23.1 % — ABNORMAL HIGH (ref 11.2–14.5)
lymph#: 2.4 10*3/uL (ref 0.9–3.3)

## 2012-09-25 LAB — FERRITIN: Ferritin: 7 ng/mL — ABNORMAL LOW (ref 10–291)

## 2012-09-25 LAB — IRON AND TIBC: UIBC: 418 ug/dL — ABNORMAL HIGH (ref 125–400)

## 2012-09-25 MED ORDER — IOHEXOL 350 MG/ML SOLN
80.0000 mL | Freq: Once | INTRAVENOUS | Status: AC | PRN
  Administered 2012-09-25: 80 mL via INTRAVENOUS

## 2012-09-27 ENCOUNTER — Telehealth: Payer: Self-pay | Admitting: Internal Medicine

## 2012-09-27 DIAGNOSIS — R079 Chest pain, unspecified: Secondary | ICD-10-CM

## 2012-09-27 DIAGNOSIS — R0602 Shortness of breath: Secondary | ICD-10-CM

## 2012-09-27 NOTE — Telephone Encounter (Signed)
Follow Up     Calling in to follow up on CT results. Would like to speak to nurse.

## 2012-09-27 NOTE — Telephone Encounter (Signed)
This is Dr. Rosalyn Charters patient.  Will forward to Lauren.

## 2012-09-27 NOTE — Telephone Encounter (Signed)
I spoke with the pt and made her aware of CTA results from 09/25/12.  I made the pt aware that the test that was performed was not a full evaluation of her coronary anatomy.  Dr [name] would like proceed with Cardiac CT at this time.  Order placed and I will have [facility] arrange test.

## 2012-10-02 ENCOUNTER — Telehealth: Payer: Self-pay | Admitting: *Deleted

## 2012-10-02 ENCOUNTER — Encounter: Payer: Self-pay | Admitting: *Deleted

## 2012-10-02 NOTE — Telephone Encounter (Signed)
Sharonda Lloyd-Fate at 10/02/2012 5:04 PM   Status: Signed            Ms. Condon Cardiac CT is scheduled for 10/17/12 @ 1 pm. Verbal and written instruction mailed to patient.

## 2012-10-02 NOTE — Telephone Encounter (Addendum)
Left message for Melinda [name] to give me a call to schedule Cardiac CT.

## 2012-10-02 NOTE — Telephone Encounter (Signed)
Ms. Ringel Cardiac CT is scheduled for 10/17/12 @ 1 pm. Verbal and written instruction mailed to patient.

## 2012-10-09 ENCOUNTER — Encounter: Payer: Self-pay | Admitting: Internal Medicine

## 2012-10-16 NOTE — Telephone Encounter (Signed)
I spoke with the pt and she actually takes Inderal 20mg  at bedtime (med list updated).  I instructed her to take her normal dose tonight and then go ahead and take another 20mg  of Inderal tomorrow morning.  The pt was instructed to drink plenty of water after her test.

## 2012-10-17 ENCOUNTER — Ambulatory Visit (HOSPITAL_COMMUNITY)
Admission: RE | Admit: 2012-10-17 | Discharge: 2012-10-17 | Disposition: A | Discharge status: Home / Self Care | Payer: Managed Care, Other (non HMO) | Source: Ambulatory Visit | Attending: Cardiology | Admitting: Cardiology

## 2012-10-17 ENCOUNTER — Encounter (HOSPITAL_COMMUNITY): Payer: Self-pay

## 2012-10-17 ENCOUNTER — Encounter (HOSPITAL_COMMUNITY): Payer: Managed Care, Other (non HMO)

## 2012-10-17 DIAGNOSIS — R0602 Shortness of breath: Secondary | ICD-10-CM

## 2012-10-17 DIAGNOSIS — R079 Chest pain, unspecified: Secondary | ICD-10-CM | POA: Insufficient documentation

## 2012-10-17 MED ORDER — METOPROLOL TARTRATE 1 MG/ML IV SOLN
5.0000 mg | INTRAVENOUS | Status: DC | PRN
  Administered 2012-10-17: 5 mg via INTRAVENOUS

## 2012-10-17 MED ORDER — IOHEXOL 350 MG/ML SOLN
100.0000 mL | Freq: Once | INTRAVENOUS | Status: AC | PRN
  Administered 2012-10-17: 80 mL via INTRAVENOUS

## 2012-10-17 MED ORDER — NITROGLYCERIN 0.4 MG SL SUBL
SUBLINGUAL_TABLET | SUBLINGUAL | Status: AC
  Administered 2012-10-17: 13:00:00
  Filled 2012-10-17: qty 25

## 2012-10-17 MED ORDER — METOPROLOL TARTRATE 1 MG/ML IV SOLN
INTRAVENOUS | Status: AC
  Filled 2012-10-17: qty 10

## 2012-10-18 ENCOUNTER — Encounter (HOSPITAL_COMMUNITY)
Admission: RE | Admit: 2012-10-18 | Discharge: 2012-10-18 | Disposition: A | Discharge status: Home / Self Care | Payer: Managed Care, Other (non HMO) | Source: Ambulatory Visit | Attending: Gastroenterology | Admitting: Gastroenterology

## 2012-10-18 DIAGNOSIS — R079 Chest pain, unspecified: Secondary | ICD-10-CM

## 2012-10-18 DIAGNOSIS — D509 Iron deficiency anemia, unspecified: Secondary | ICD-10-CM | POA: Insufficient documentation

## 2012-10-18 MED ORDER — SODIUM CHLORIDE 0.9 % IV SOLN
200.0000 mg | INTRAVENOUS | Status: DC
  Administered 2012-10-18: 200 mg via INTRAVENOUS
  Filled 2012-10-18: qty 10

## 2012-10-20 ENCOUNTER — Telehealth: Payer: Self-pay | Admitting: Cardiology

## 2012-10-20 NOTE — Telephone Encounter (Signed)
New problem    Pt calling for ct results

## 2012-10-23 NOTE — Telephone Encounter (Signed)
Notes Recorded by Herby Abraham, MD on 10/20/2012 at 4:03 PM Patient called regarding the results of her studies. Feeling a bit better. Her anemia is improving. Still somewhat anemic. Based on the findings, would not do further cardiac testing in the absence of more symptoms. Needs to remain hydrated, and this was stressed to the patient. Will send to her primary care doctor. I have encouraged her to continue following with her primary care MD. She will see Dr. Tenny Craw in our office on June 6

## 2012-11-10 ENCOUNTER — Ambulatory Visit (INDEPENDENT_AMBULATORY_CARE_PROVIDER_SITE_OTHER): Payer: Managed Care, Other (non HMO) | Admitting: Internal Medicine

## 2012-11-10 ENCOUNTER — Encounter: Payer: Self-pay | Admitting: Internal Medicine

## 2012-11-10 VITALS — BP 111/75 | HR 72 | Ht 66.0 in | Wt 172.8 lb

## 2012-11-10 DIAGNOSIS — R002 Palpitations: Secondary | ICD-10-CM

## 2012-11-10 NOTE — Progress Notes (Signed)
HPI Patient is a 52 yo who was previously follow by Ermalene Postin  She was seen in clinic just recently   : This nice lady is seen back in followup. She originally presented with near-syncope, and also some chest discomfort. She subsequently was noted to have a substantial anemia. She's been evaluated by the gastroenterologists, and also by her primary care physician. [facility], and subsequent restarted on iron.   She continued to have some symptoms and underwent stress testing.  . Impression  Exercise Capacity: Fair exercise capacity.  BP Response: Normal blood pressure response.  Clinical Symptoms: There is dyspnea.  ECG Impression: Significant ST abnormalities consistent with ischemia.  Comparison with Prior Nuclear Study: No images to compare  Overall Impression: Low risk stress nuclear study with ECG changes but normal perfusion.  LV Ejection Fraction: 85%. LV Wall Motion: NL LV Function; NL Wall Motion   Because of the EKG changes catheterization was discussed.  Instead the patient was set up for CT angiography.  Cardiac CT was done that showed no coronary artery plaque  Calcium score was 0  Since seen by T Stuckey the patient is feeling better.  She denies CP  Breathing is OK      Allergies  Allergen Reactions  . Amoclan (Amoxicillin-Pot Clavulanate)   . Amoxapine And Related   . Antihistamines, Diphenhydramine-Type   . Ceclor (Cefaclor)   . Cephalexin     Kef tabs only (maybe dye)  . Ciprofloxacin   . Codeine   . Doxycycline Calcium   . Erythromycin   . Flagyl (Metronidazole Hcl)   . Floxin (Ofloxacin)   . Macrodantin   . Prednisolone   . Promethazine Hcl   . Tetracyclines & Related     Current Outpatient Prescriptions  Medication Sig Dispense Refill  . clonazePAM (KLONOPIN) 0.5 MG tablet Take 1 1/2 tablet  in the morning and 1 tablet at night      . FLUoxetine (PROZAC) 20 MG capsule Take 20 mg by mouth daily.      . IRON PO Take 150 mg by mouth  daily.      . nitroGLYCERIN (NITROSTAT) 0.4 MG SL tablet Place 0.4 mg under the tongue every 5 (five) minutes as needed for chest pain.      . pantoprazole (PROTONIX) 40 MG tablet Take 40 mg by mouth daily.      . propranolol (INDERAL) 20 MG tablet Take 1 tablet (20 mg total) by mouth once.      . rosuvastatin (CRESTOR) 5 MG tablet Take 5 mg by mouth daily.       No current facility-administered medications for this visit.    Past Medical History  Diagnosis Date  . Hypertension     ?  [name] Dyslipidemia     untreated  . GERD (gastroesophageal reflux disease)   . Palpitation     positive tilt table test.   . Microcytic anemia 06/22/2012  . Diabetes mellitus type 2, controlled   . Nephrolith     Past Surgical History  Procedure Laterality Date  . Cardiac catheterization  2002    normal LV function and no significant coronary obstruction  . Tonsillectomy    . Kidney surgery    . Appendectomy    . Partial hysterectomy    . Breast reduction surgery      Family History  Problem Relation Age of Onset  . Coronary artery disease      Fx of  .  Heart attack    . Diabetes    . Heart disease Mother   . Liver disease Mother   . Heart disease Father   . Heart attack Father   . Cancer Father     prostate CA  . Cancer Maternal Grandmother 60    ovarian cancer    History   Social History  . Marital Status: Married    Spouse Name: N/A    Number of Children: 1  . Years of Education: N/A   Occupational History  .      not working now.   .     Social History Main Topics  . Smoking status: Former Smoker    Quit date: 07/08/2010  . Smokeless tobacco: Not on file     Comment: uses electronic  . Alcohol Use: No  . Drug Use: No  . Sexually Active: Yes   Other Topics Concern  . Not on file   Social History Narrative  . No narrative on file    Review of Systems:  All systems reviewed.  They are negative to the above problem except as previously stated.  Vital Signs: BP  111/75  Pulse 72  Ht 5\' 6"  (1.676 m)  Wt 172 lb 12.8 oz (78.382 kg)  BMI 27.9 kg/m2  SpO2 98%  Physical Exam Patient is in NAD HEENT:  Normocephalic, atraumatic. EOMI, PERRLA.  Neck: JVP is normal.  No bruits.  Lungs: clear to auscultation. No rales no wheezes.  Heart: Regular rate and rhythm. Normal S1, S2. No S3.   No significant murmurs. PMI not displaced.  Abdomen:  Supple, nontender. Normal bowel sounds. No masses. No hepatomegaly.  Extremities:   Good distal pulses throughout. No lower extremity edema.  Musculoskeletal :moving all extremities.  Neuro:   alert and oriented x3.  CN II-XII grossly intact.  EKG  SR 72 bpm.   Assessment and Plan:  1.  CP  Not related to CAD.  Improved now that anemia being treating.  Would  Need periodic f/u of CBC over time given above.    2.  HL  I would recomm to continue on Crestor.  WIll need f/u of lipids periodically

## 2012-11-10 NOTE — Patient Instructions (Addendum)
Your physician wants you to follow-up in: February 2015 with Dr. Tenny Craw.  You will receive a reminder letter in the mail two months in advance. If you don't receive a letter, please call our office to schedule the follow-up appointment.

## 2012-11-17 ENCOUNTER — Telehealth: Payer: Self-pay | Admitting: Oncology

## 2012-11-20 ENCOUNTER — Other Ambulatory Visit: Payer: Managed Care, Other (non HMO)

## 2013-01-15 ENCOUNTER — Other Ambulatory Visit: Payer: Managed Care, Other (non HMO) | Admitting: Lab

## 2013-01-16 ENCOUNTER — Ambulatory Visit (INDEPENDENT_AMBULATORY_CARE_PROVIDER_SITE_OTHER): Payer: Managed Care, Other (non HMO) | Admitting: Nurse Practitioner

## 2013-01-16 ENCOUNTER — Other Ambulatory Visit: Payer: Self-pay | Admitting: *Deleted

## 2013-01-16 ENCOUNTER — Encounter: Payer: Self-pay | Admitting: Nurse Practitioner

## 2013-01-16 VITALS — BP 120/80 | HR 76 | Ht 66.0 in | Wt 172.8 lb

## 2013-01-16 DIAGNOSIS — R002 Palpitations: Secondary | ICD-10-CM

## 2013-01-16 DIAGNOSIS — R109 Unspecified abdominal pain: Secondary | ICD-10-CM

## 2013-01-16 DIAGNOSIS — E785 Hyperlipidemia, unspecified: Secondary | ICD-10-CM

## 2013-01-16 LAB — URINALYSIS, ROUTINE W REFLEX MICROSCOPIC
Bilirubin Urine: NEGATIVE
Hgb urine dipstick: NEGATIVE
Ketones, ur: NEGATIVE
Nitrite: NEGATIVE
Specific Gravity, Urine: >=1.03 (ref 1.000–1.030)
Total Protein, Urine: NEGATIVE
Urine Glucose: NEGATIVE
Urobilinogen, UA: 0.2 (ref 0.0–1.0)
pH: 5.5 (ref 5.0–8.0)

## 2013-01-16 NOTE — Patient Instructions (Addendum)
Continue with your current medicines  We will check a urinalysis today  I will try to get you a visit with Dr. Tiburcio Pea today at 12:45 pm  See Dr. Tenny Craw as planned in February of 2015  Call the [facility] office at [phone] if you have any questions, problems or concerns.

## 2013-01-16 NOTE — Progress Notes (Signed)
Memory Argue Date of Birth: 19-Mar-1961 Medical Record #[id number]  History of Present Illness: Melinda [name] is seen back today for a work in visit. Seen for Dr. Tenny Craw - former patient of Dr. Rosalyn Charters. No known CAD per remote cath. Has anxiety, HLD, diabetes, past tobacco abuse and GERD. Presented with near syncope back in January found to have substantial microcytic anemia and has been restarted on her iron. Negative 24 Holter. She had repeat stress testing due to continued symptoms which was positive for EKG changes but with normal perfusion - subsequent cardiac CT was done and showed no coronary plaque. Her calcium score was 0.   Last seen in June and seemed to be doing ok. Was to return in February of 2015.  Comes back today. Here alone. Here for swelling in her legs and belly. Notes it started about a week ago. Her lower abdomen hurts. She is chronically constipated and has to use medicine for her bowels. Remains on her iron therapy. Belly feels bloated. No recent GYN exam - still has her ovaries. Worried about interstitial cystitis. Her swelling does improve overnight. Her weight is unchanged. She has a normal EF.    Current Outpatient Prescriptions  Medication Sig Dispense Refill  . clonazePAM (KLONOPIN) 0.5 MG tablet Take 0.5 mg by mouth 2 (two) times daily as needed.       [name] FLUoxetine (PROZAC) 20 MG capsule Take 20 mg by mouth daily.      . IRON PO Take 150 mg by mouth daily.      . nitroGLYCERIN (NITROSTAT) 0.4 MG SL tablet Place 0.4 mg under the tongue every 5 (five) minutes as needed for chest pain.      . pantoprazole (PROTONIX) 40 MG tablet Take 40 mg by mouth daily.      . propranolol (INDERAL) 20 MG tablet Take 1 tablet (20 mg total) by mouth once.      . rosuvastatin (CRESTOR) 5 MG tablet Take 5 mg by mouth daily.       No current facility-administered medications for this visit.    Allergies  Allergen Reactions  . Amoclan (Amoxicillin-Pot Clavulanate)   . Amoxapine And  Related   . Antihistamines, Diphenhydramine-Type   . Ceclor (Cefaclor)   . Cephalexin     Kef tabs only (maybe dye)  . Ciprofloxacin   . Codeine   . Doxycycline Calcium   . Erythromycin   . Flagyl (Metronidazole Hcl)   . Floxin (Ofloxacin)   . Macrodantin   . Prednisolone   . Promethazine Hcl   . Tetracyclines & Related     Past Medical History  Diagnosis Date  . Hypertension     ?  [name] Dyslipidemia     untreated  . GERD (gastroesophageal reflux disease)   . Palpitation     positive tilt table test.   . Microcytic anemia 06/22/2012  . Diabetes mellitus type 2, controlled   . Nephrolith     Past Surgical History  Procedure Laterality Date  . Cardiac catheterization  2002    normal LV function and no significant coronary obstruction  . Tonsillectomy    . Kidney surgery    . Appendectomy    . Partial hysterectomy    . Breast reduction surgery      History  Smoking status  . Former Smoker  . Quit date: 07/08/2010  Smokeless tobacco  . Not on file    Comment: uses electronic    History  Alcohol Use No  Family History  Problem Relation Age of Onset  . Coronary artery disease      Fx of  . Heart attack    . Diabetes    . Heart disease Mother   . Liver disease Mother   . Heart disease Father   . Heart attack Father   . Cancer Father     prostate CA  . Cancer Maternal Grandmother 60    ovarian cancer    Review of Systems: The review of systems is per the HPI.  All other systems were reviewed and are negative.  Physical Exam: BP 120/80  Pulse 76  Ht 5\' 6"  (1.676 m)  Wt 172 lb 12.8 oz (78.382 kg)  BMI 27.9 kg/m2 Patient is very pleasant and in no acute distress. Weight is unchanged. Skin is warm and dry. Color is normal.  HEENT is unremarkable. Normocephalic/atraumatic. PERRL. Sclera are nonicteric. Neck is supple. No masses. No JVD. Lungs are clear. Cardiac exam shows a regular rate and rhythm. Abdomen is soft. Extremities are without edema. Gait  and ROM are intact. No gross neurologic deficits noted.  LABORATORY DATA:  Lab Results  Component Value Date   WBC 5.5 09/25/2012   HGB 11.6 09/25/2012   HCT 36.9 09/25/2012   PLT 287 09/25/2012   GLUCOSE 126* 09/21/2012   ALT 28 07/26/2012   AST 28 07/26/2012   NA 138 09/21/2012   K 4.2 09/21/2012   CL 104 09/21/2012   CREATININE 0.8 09/21/2012   BUN 20 09/21/2012   CO2 26 09/21/2012   INR 1.0 07/26/2012   CARDIAC CT IMPRESSION: 1. No coronary plaque or stenosis noted.  2. Coronary artery calcium score of 0 Agatston units suggests low risk of future cardiac events.   Assessment / Plan:  1. Swelling/abdominal pain/bloating - I do not this this is a cardiac issue. Her weight is unchanged. Normal EF. No mention of diastolic dysfunction as well. She has tried to get in with her PCP without luck - we will check a UA today - I have called and gotten her a visit for later today with Dr. Tiburcio Pea. May need GYN visit as well.   2. HLD - on statin therapy  3. Microcytic anemia - on iron therapy.   Patient is agreeable to this plan and will call if any problems develop in the interim.   Rosalio Macadamia, RN, ANP-C [facility] [address] [place], [place]  [address]

## 2013-01-18 ENCOUNTER — Telehealth: Payer: Self-pay | Admitting: Nurse Practitioner

## 2013-01-18 NOTE — Telephone Encounter (Signed)
Follow Up     Pt calling following up on urinalysis. Please call.

## 2013-01-19 NOTE — Telephone Encounter (Signed)
Pt aware of results by phone also that I sent urinalysis to Dr. Holley Bouche

## 2013-01-19 NOTE — Telephone Encounter (Signed)
F/up   Rtn called.. Urinalysis Report// Nurse Danielle called.

## 2013-01-22 ENCOUNTER — Other Ambulatory Visit: Payer: Self-pay | Admitting: Family Medicine

## 2013-01-22 DIAGNOSIS — R109 Unspecified abdominal pain: Secondary | ICD-10-CM

## 2013-01-23 ENCOUNTER — Ambulatory Visit
Admission: RE | Admit: 2013-01-23 | Discharge: 2013-01-23 | Disposition: A | Discharge status: Home / Self Care | Payer: Managed Care, Other (non HMO) | Source: Ambulatory Visit | Attending: Family Medicine | Admitting: Family Medicine

## 2013-01-23 DIAGNOSIS — R109 Unspecified abdominal pain: Secondary | ICD-10-CM

## 2013-01-23 MED ORDER — IOHEXOL 300 MG/ML  SOLN
100.0000 mL | Freq: Once | INTRAMUSCULAR | Status: AC | PRN
  Administered 2013-01-23: 100 mL via INTRAVENOUS

## 2013-03-09 ENCOUNTER — Other Ambulatory Visit: Payer: Self-pay | Admitting: Oncology

## 2013-03-09 DIAGNOSIS — D509 Iron deficiency anemia, unspecified: Secondary | ICD-10-CM

## 2013-03-12 ENCOUNTER — Other Ambulatory Visit: Payer: Managed Care, Other (non HMO)

## 2013-04-12 ENCOUNTER — Other Ambulatory Visit: Payer: Self-pay

## 2013-05-07 ENCOUNTER — Other Ambulatory Visit: Payer: Managed Care, Other (non HMO)

## 2013-07-02 ENCOUNTER — Other Ambulatory Visit: Payer: Managed Care, Other (non HMO)

## 2013-08-09 ENCOUNTER — Ambulatory Visit: Payer: Managed Care, Other (non HMO) | Admitting: Internal Medicine

## 2013-08-27 ENCOUNTER — Ambulatory Visit: Payer: Managed Care, Other (non HMO) | Admitting: Hematology and Oncology

## 2013-08-27 ENCOUNTER — Other Ambulatory Visit: Payer: Self-pay | Admitting: Hematology and Oncology

## 2013-08-27 ENCOUNTER — Other Ambulatory Visit: Payer: Managed Care, Other (non HMO)

## 2013-09-12 ENCOUNTER — Encounter: Payer: Self-pay | Admitting: Internal Medicine

## 2013-09-17 ENCOUNTER — Ambulatory Visit (INDEPENDENT_AMBULATORY_CARE_PROVIDER_SITE_OTHER): Payer: Managed Care, Other (non HMO) | Admitting: Internal Medicine

## 2013-09-17 ENCOUNTER — Ambulatory Visit: Payer: Managed Care, Other (non HMO) | Admitting: Internal Medicine

## 2013-09-17 ENCOUNTER — Encounter: Payer: Self-pay | Admitting: Internal Medicine

## 2013-09-17 VITALS — BP 116/74 | HR 76 | Ht 66.0 in | Wt 156.0 lb

## 2013-09-17 DIAGNOSIS — R002 Palpitations: Secondary | ICD-10-CM

## 2013-09-17 DIAGNOSIS — I10 Essential (primary) hypertension: Secondary | ICD-10-CM

## 2013-09-17 MED ORDER — PROPRANOLOL HCL 20 MG PO TABS: 10.0000 mg | ORAL_TABLET | Freq: Every day | ORAL | Status: DC

## 2013-09-17 NOTE — Progress Notes (Signed)
Melinda [name] Date of Birth: 19-Dec-1960 Medical Record #[id number]  History of Present Illness: Patient is a 53 yo with history of hyperlipidemia, tobacco use , GERD, anemia  And chest pain  She was previously seen by Lowella Dell  She was last seen by L Gerhardt. She has had stress testing, cath, cardiac CT in past.  Negative. Since seen she has done fairly well  Denies CP  Breathing is OK     Current Outpatient Prescriptions  Medication Sig Dispense Refill  . clonazePAM (KLONOPIN) 0.5 MG tablet Take 0.5 mg by mouth 2 (two) times daily as needed.       [name] FLUoxetine (PROZAC) 20 MG tablet Take 30 mg by mouth daily.      . IRON PO Take 150 mg by mouth 2 (two) times daily.       . nitroGLYCERIN (NITROSTAT) 0.4 MG SL tablet Place 0.4 mg under the tongue every 5 (five) minutes as needed for chest pain.      . pantoprazole (PROTONIX) 40 MG tablet Take 40 mg by mouth daily.      . propranolol (INDERAL) 20 MG tablet Take 1 tablet (20 mg total) by mouth once.       No current facility-administered medications for this visit.    Allergies  Allergen Reactions  . Amoclan [Amoxicillin-Pot Clavulanate]   . Amoxapine And Related   . Antihistamines, Diphenhydramine-Type   . Ceclor [Cefaclor]   . Cephalexin     Kef tabs only (maybe dye)  . Ciprofloxacin   . Codeine   . Doxycycline Calcium   . Erythromycin   . Flagyl [Metronidazole Hcl]   . Floxin [Ofloxacin]   . Macrodantin   . Prednisolone   . Promethazine Hcl   . Tetracyclines & Related     Past Medical History  Diagnosis Date  . Hypertension     ?  [name] Dyslipidemia     untreated  . GERD (gastroesophageal reflux disease)   . Palpitation     positive tilt table test.   . Microcytic anemia 06/22/2012  . Diabetes mellitus type 2, controlled   . Nephrolith     Past Surgical History  Procedure Laterality Date  . Cardiac catheterization  2002    normal LV function and no significant coronary obstruction  . Tonsillectomy    .  Kidney surgery    . Appendectomy    . Partial hysterectomy    . Breast reduction surgery      History  Smoking status  . Former Smoker  . Quit date: 07/08/2010  Smokeless tobacco  . Not on file    Comment: uses electronic    History  Alcohol Use No    Family History  Problem Relation Age of Onset  . Coronary artery disease      Fx of  . Heart attack    . Diabetes    . Heart disease Mother   . Liver disease Mother   . Heart disease Father   . Heart attack Father   . Cancer Father     prostate CA  . Cancer Maternal Grandmother 60    ovarian cancer    Review of Systems: The review of systems is per the HPI.  All other systems were reviewed and are negative.  Physical Exam: BP 116/74  Pulse 76  Ht 5\' 6"  (1.676 m)  Wt 156 lb (70.761 kg)  BMI 25.19 kg/m2 Patient is very pleasant and in no acute distress. Weight is  unchanged. Skin is warm and dry. Color is normal.  HEENT is unremarkable. Normocephalic/atraumatic. PERRL. Sclera are nonicteric. Neck is supple. No masses. No JVD. Lungs are clear. Cardiac exam shows a regular rate and rhythm. Abdomen is soft. Extremities are without edema. Gait and ROM are intact. No gross neurologic deficits noted.   EKG  SR 76 LABORATORY DATA:  Lab Results  Component Value Date   WBC 5.5 09/25/2012   HGB 11.6 09/25/2012   HCT 36.9 09/25/2012   PLT 287 09/25/2012   GLUCOSE 126* 09/21/2012   ALT 28 07/26/2012   AST 28 07/26/2012   NA 138 09/21/2012   K 4.2 09/21/2012   CL 104 09/21/2012   CREATININE 0.8 09/21/2012   BUN 20 09/21/2012   CO2 26 09/21/2012   INR 1.0 07/26/2012   CARDIAC CT IMPRESSION: 1. No coronary plaque or stenosis noted.  2. Coronary artery calcium score of 0 Agatston units suggests low risk of future cardiac events.   Assessment / Plan:  1.  CP  Denies.  2. HLD - on statin   Get labs from primary MD.    3. Microcytic anemia - on iron therapy.   F/U in March 2016

## 2013-09-17 NOTE — Patient Instructions (Signed)
Your physician wants you to follow-up in: MARCH 2016 [place].  You will receive a reminder letter in the mail two months in advance. If you don't receive a letter, please call our office to schedule the follow-up appointment.  Your physician has recommended you make the following change in your medication:  CHANGE INDERAL DOSE TO 10 MG BY MOUTH ONCE A DAY

## 2013-09-25 ENCOUNTER — Telehealth: Payer: Self-pay | Admitting: *Deleted

## 2013-09-25 NOTE — Telephone Encounter (Signed)
Spoke with pt, labs from dr Kenton Kingfisher office reviewed by dr Harrington Challenger. Lipids are excellent. No change in her current medicine. Patient voiced understanding

## 2014-04-30 ENCOUNTER — Other Ambulatory Visit: Payer: Self-pay | Admitting: Otolaryngology

## 2014-04-30 ENCOUNTER — Ambulatory Visit
Admission: RE | Admit: 2014-04-30 | Discharge: 2014-04-30 | Disposition: A | Discharge status: Home / Self Care | Payer: Managed Care, Other (non HMO) | Source: Ambulatory Visit | Attending: Otolaryngology | Admitting: Otolaryngology

## 2014-04-30 DIAGNOSIS — R05 Cough: Secondary | ICD-10-CM

## 2014-04-30 DIAGNOSIS — R059 Cough, unspecified: Secondary | ICD-10-CM

## 2014-07-16 ENCOUNTER — Encounter: Payer: Self-pay | Admitting: Internal Medicine

## 2014-08-13 ENCOUNTER — Encounter: Payer: Self-pay | Admitting: Internal Medicine

## 2014-09-04 ENCOUNTER — Telehealth: Payer: Self-pay | Admitting: Internal Medicine

## 2014-09-04 DIAGNOSIS — R0989 Other specified symptoms and signs involving the circulatory and respiratory systems: Secondary | ICD-10-CM

## 2014-09-04 NOTE — Telephone Encounter (Signed)
New message        Pt would for a nurse to give her a call

## 2014-09-05 NOTE — Telephone Encounter (Signed)
Patient hears a "whooshing" sound that seems to coincide with her heart beat. She can be sitting at her dest or lying down at night--hears any time of day. Her father had both of his carotids "done". Pt has concerns and is requesting doppler. Aware that I will review with Dr. Harrington Challenger next week and call her back. Pt did already contact PCP requesting doppler.  They have not gotten back to her yet.

## 2014-09-06 ENCOUNTER — Other Ambulatory Visit: Payer: Self-pay | Admitting: Family Medicine

## 2014-09-06 DIAGNOSIS — H9313 Tinnitus, bilateral: Secondary | ICD-10-CM

## 2014-09-07 NOTE — Telephone Encounter (Signed)
Set up for carotid USN  Dx  Bruit

## 2014-09-09 NOTE — Telephone Encounter (Signed)
Placed order for carotids. Staff message sent to [facility] to call patient and schedule.

## 2014-09-09 NOTE — Telephone Encounter (Signed)
Carotids scheduled for 4/11 by Dr. Doreene Adas office.

## 2014-09-16 ENCOUNTER — Ambulatory Visit
Admission: RE | Admit: 2014-09-16 | Discharge: 2014-09-16 | Disposition: A | Discharge status: Home / Self Care | Payer: 59 | Source: Ambulatory Visit | Attending: Family Medicine | Admitting: Family Medicine

## 2014-09-16 DIAGNOSIS — R0989 Other specified symptoms and signs involving the circulatory and respiratory systems: Secondary | ICD-10-CM

## 2014-09-16 DIAGNOSIS — H9313 Tinnitus, bilateral: Secondary | ICD-10-CM

## 2014-09-30 ENCOUNTER — Encounter: Payer: Self-pay | Admitting: Internal Medicine

## 2014-09-30 ENCOUNTER — Ambulatory Visit (INDEPENDENT_AMBULATORY_CARE_PROVIDER_SITE_OTHER): Payer: 59 | Admitting: Internal Medicine

## 2014-09-30 VITALS — BP 126/68 | HR 73 | Ht 66.0 in | Wt 158.4 lb

## 2014-09-30 DIAGNOSIS — R002 Palpitations: Secondary | ICD-10-CM | POA: Diagnosis not present

## 2014-09-30 NOTE — Progress Notes (Signed)
Cardiology Office Note   Date:  09/30/2014   ID:  Melinda [name], Melinda [name] 1960/08/20, MRN [id number]  PCP:  Shirline Frees, MD  Cardiologist:   Dorris Carnes, MD   No chief complaint on file.     History of Present Illness: Melinda [name] is a 54 y.o. female with a history of Chest pain, hyperlipidemia, tob use and GERD  She was previously followd by Lowella Dell  Has had stress testing, cath and cardiac CT in past Negative  I saw her in April 2015  Since seen she has done well from a cardiac standpoint  She denies CP  No dizziness No palpitaiotns.  She says she can hear her heart beating in her R ear.  Had a carotd USN that showed minimal  plaquing  Noted that an MRI in 2012 showd mild narrowing pf MCA M1 felt to be plaquing not an aneurysim    Current Outpatient Prescriptions  Medication Sig Dispense Refill  . clonazePAM (KLONOPIN) 0.5 MG tablet Take 0.5 mg by mouth 2 (two) times daily as needed for anxiety (for anxiety).     . CRESTOR 5 MG tablet Take 5 mg by mouth daily.  1  . esomeprazole (NEXIUM) 40 MG capsule Take 40 mg by mouth daily at 12 noon.    [name] FLUoxetine (PROZAC) 20 MG tablet Take 20 mg by mouth daily.     . IRON PO Take 150 mg by mouth every other day.     . nitroGLYCERIN (NITROSTAT) 0.4 MG SL tablet Place 0.4 mg under the tongue every 5 (five) minutes as needed for chest pain.    [name] propranolol (INDERAL) 20 MG tablet Take 0.5 tablets (10 mg total) by mouth daily.     No current facility-administered medications for this visit.    Allergies:   Amoclan; Amoxapine and related; Antihistamines, diphenhydramine-type; Ceclor; Cephalexin; Ciprofloxacin; Codeine; Doxycycline calcium; Erythromycin; Flagyl; Floxin; Macrodantin; Prednisolone; Promethazine hcl; and Tetracyclines & related   Past Medical History  Diagnosis Date  . Hypertension     ?  [name] Dyslipidemia     untreated  . GERD (gastroesophageal reflux disease)   . Palpitation     positive tilt table test.   .  Microcytic anemia 06/22/2012  . Diabetes mellitus type 2, controlled   . Nephrolith     Past Surgical History  Procedure Laterality Date  . Cardiac catheterization  2002    normal LV function and no significant coronary obstruction  . Tonsillectomy    . Kidney surgery    . Appendectomy    . Partial hysterectomy    . Breast reduction surgery       Social History:  The patient  reports that she quit smoking about 4 years ago. She does not have any smokeless tobacco history on file. She reports that she does not drink alcohol or use illicit drugs.   Family History:  The patient's family history includes Cancer in her father; Cancer (age of onset: 75) in her maternal grandmother; Coronary artery disease in an other family member; Diabetes in an other family member; Heart attack in her father and another family member; Heart disease in her father and mother; Liver disease in her mother.    ROS:  Please see the history of present illness. All other systems are reviewed and  Negative to the above problem except as noted.    PHYSICAL EXAM: VS:  BP 126/68 mmHg  Pulse 73  Ht 5\' 6"  (1.676 m)  Wt 158 lb 6.4 oz (71.85 kg)  BMI 25.58 kg/m2  GEN: Well nourished, well developed, in no acute distress HEENT: normal Neck: no JVD, carotid bruits, or masses Cardiac: RRR; no murmurs, rubs, or gallops,no edema  Respiratory:  clear to auscultation bilaterally, normal work of breathing GI: soft, nontender, nondistended, + BS  No hepatomegaly  MS: no deformity Moving all extremities   Skin: warm and dry, no rash Neuro:  Strength and sensation are intact Psych: euthymic mood, full affect   EKG:  EKG is ordered today.  SR 73 bpm  Nonspecific ST T wave changes     Lipid Panel No results found for: CHOL, TRIG, HDL, CHOLHDL, VLDL, LDLCALC, LDLDIRECT    Wt Readings from Last 3 Encounters:  09/30/14 158 lb 6.4 oz (71.85 kg)  09/17/13 156 lb (70.761 kg)  01/16/13 172 lb 12.8 oz (78.382 kg)       ASSESSMENT AND PLAN:  1  Chest pain  Denies   2.  Neuro.  USN with minimal plaquing  Note MRI was not definite on aneursym.  With current symptoms ? If repeat indicated  Will forward to Dr Kenton Kingfisher  3.  HL  Continue statin  With atheroscleroitc changes in carotids   F/U 1 year    Signed, Dorris Carnes, MD  09/30/2014 11:36 AM    [facility] [address], [place], [place]  [address] Phone: [phone]; Fax: [phone]

## 2014-09-30 NOTE — Patient Instructions (Signed)
Your physician recommends that you continue on your current medications as directed. Please refer to the Current Medication list given to you today. Your physician wants you to follow-up in: 1 year with Dr. Ross.  You will receive a reminder letter in the mail two months in advance. If you don't receive a letter, please call our office to schedule the follow-up appointment.  

## 2014-10-07 ENCOUNTER — Telehealth: Payer: Self-pay | Admitting: Internal Medicine

## 2014-10-07 DIAGNOSIS — R930 Abnormal findings on diagnostic imaging of skull and head, not elsewhere classified: Secondary | ICD-10-CM

## 2014-10-07 NOTE — Telephone Encounter (Signed)
LMTCB. 5/2 @1 :15pm.  Unable to determine from notes which office was to order MRI. Will route to Dr. Harrington Challenger for clarification.

## 2014-10-07 NOTE — Telephone Encounter (Signed)
Place order for MRI/MRA of brain  Abnormal MRI in 2012.  Patient with problems hearing pulse in R ear.   Evaluate for aneurysm.

## 2014-10-07 NOTE — Telephone Encounter (Signed)
New Message  Pt was told by Dr. Harrington Challenger to contact our office, if MRI had not been setup yet- no one has called to sched MRI. Please call back and dsicuss.

## 2014-10-08 NOTE — Telephone Encounter (Signed)
Called patient to inform order placed.  Someone will call to schedule.

## 2014-10-11 ENCOUNTER — Telehealth: Payer: Self-pay | Admitting: Internal Medicine

## 2014-10-11 NOTE — Telephone Encounter (Signed)
error 

## 2014-10-31 ENCOUNTER — Telehealth: Payer: Self-pay | Admitting: Internal Medicine

## 2014-10-31 NOTE — Telephone Encounter (Signed)
Having MR/MRA @ [facility]  on 11/18/14 @ 6 pm   Will need something to help me relaxed. Call to [facility] on [address].

## 2014-11-06 MED ORDER — DIAZEPAM 2 MG PO TABS: ORAL_TABLET | ORAL | Status: DC

## 2014-11-06 NOTE — Telephone Encounter (Signed)
Valium 2 mg   One pill only If she is comfortable to take Otherwise would not recomm anti anxiety agent

## 2014-11-06 NOTE — Telephone Encounter (Signed)
I spoke with the patient to let her know about the Xanax- she reports she has been on this in the past and developed an addiction to it. She has finally been able to remain off of it. She would prefer not to take this as a pre-med. She has taken valium in the past and done ok with this. I advised her I will forward this back to Dr. Harrington Challenger to see if we can switch her to valium instead.   She is also voicing some frustration today with how long it took to get her test scheduled. She reports that she called back about 3 times to try to get her test scheduled. She finally was able to speak with someone who scheduled the test for her while she was on the phone. She was not able to recall the names of the people she spoke with in the interim.

## 2014-11-06 NOTE — Telephone Encounter (Signed)
Forwarding to Dr. Harrington Challenger.

## 2014-11-06 NOTE — Telephone Encounter (Signed)
Could try Xanax 0.25  Can take 2 (give Rx for 2 total only)

## 2014-11-06 NOTE — Telephone Encounter (Signed)
I left a message on the patient's identified voice mail that I will be sending in Valium 2 mg x 1 dose to the pharmacy. I have recommended that she have some one with her if taking this medication so they may drive her home.

## 2014-11-18 ENCOUNTER — Ambulatory Visit
Admission: RE | Admit: 2014-11-18 | Discharge: 2014-11-18 | Disposition: A | Discharge status: Home / Self Care | Payer: 59 | Source: Ambulatory Visit | Attending: Internal Medicine | Admitting: Internal Medicine

## 2014-11-18 DIAGNOSIS — R930 Abnormal findings on diagnostic imaging of skull and head, not elsewhere classified: Secondary | ICD-10-CM

## 2014-11-21 ENCOUNTER — Telehealth: Payer: Self-pay | Admitting: Internal Medicine

## 2014-11-21 NOTE — Telephone Encounter (Signed)
Pt informed

## 2014-11-21 NOTE — Telephone Encounter (Signed)
Follow Up    Pt is calling following up on MRI results. Please call.

## 2015-06-07 ENCOUNTER — Encounter (HOSPITAL_BASED_OUTPATIENT_CLINIC_OR_DEPARTMENT_OTHER): Payer: Self-pay | Admitting: Adult Health

## 2015-06-07 ENCOUNTER — Emergency Department (HOSPITAL_BASED_OUTPATIENT_CLINIC_OR_DEPARTMENT_OTHER)
Admission: EM | Admit: 2015-06-07 | Discharge: 2015-06-07 | Disposition: A | Discharge status: Home / Self Care | Payer: Managed Care, Other (non HMO) | Attending: Emergency Medicine | Admitting: Emergency Medicine

## 2015-06-07 DIAGNOSIS — E119 Type 2 diabetes mellitus without complications: Secondary | ICD-10-CM | POA: Diagnosis not present

## 2015-06-07 DIAGNOSIS — K219 Gastro-esophageal reflux disease without esophagitis: Secondary | ICD-10-CM | POA: Insufficient documentation

## 2015-06-07 DIAGNOSIS — N3091 Cystitis, unspecified with hematuria: Secondary | ICD-10-CM | POA: Diagnosis not present

## 2015-06-07 DIAGNOSIS — D509 Iron deficiency anemia, unspecified: Secondary | ICD-10-CM | POA: Diagnosis not present

## 2015-06-07 DIAGNOSIS — Z87891 Personal history of nicotine dependence: Secondary | ICD-10-CM | POA: Insufficient documentation

## 2015-06-07 DIAGNOSIS — Z87442 Personal history of urinary calculi: Secondary | ICD-10-CM | POA: Insufficient documentation

## 2015-06-07 DIAGNOSIS — Z9889 Other specified postprocedural states: Secondary | ICD-10-CM | POA: Diagnosis not present

## 2015-06-07 DIAGNOSIS — R319 Hematuria, unspecified: Secondary | ICD-10-CM | POA: Diagnosis present

## 2015-06-07 DIAGNOSIS — Z8639 Personal history of other endocrine, nutritional and metabolic disease: Secondary | ICD-10-CM | POA: Diagnosis not present

## 2015-06-07 DIAGNOSIS — Z79899 Other long term (current) drug therapy: Secondary | ICD-10-CM | POA: Insufficient documentation

## 2015-06-07 DIAGNOSIS — Z90710 Acquired absence of both cervix and uterus: Secondary | ICD-10-CM | POA: Diagnosis not present

## 2015-06-07 DIAGNOSIS — Z9049 Acquired absence of other specified parts of digestive tract: Secondary | ICD-10-CM | POA: Insufficient documentation

## 2015-06-07 LAB — URINE MICROSCOPIC-ADD ON

## 2015-06-07 LAB — URINALYSIS, ROUTINE W REFLEX MICROSCOPIC
Bilirubin Urine: NEGATIVE
Glucose, UA: NEGATIVE mg/dL
KETONES UR: NEGATIVE mg/dL
NITRITE: NEGATIVE
PROTEIN: NEGATIVE mg/dL
Specific Gravity, Urine: 1.003 — ABNORMAL LOW (ref 1.005–1.030)
pH: 6 (ref 5.0–8.0)

## 2015-06-07 MED ORDER — CEPHALEXIN 500 MG PO CAPS: 500.0000 mg | ORAL_CAPSULE | Freq: Two times a day (BID) | ORAL | Status: DC

## 2015-06-07 MED ORDER — CEPHALEXIN 250 MG PO CAPS
500.0000 mg | ORAL_CAPSULE | Freq: Once | ORAL | Status: AC
  Administered 2015-06-07: 500 mg via ORAL
  Filled 2015-06-07: qty 2

## 2015-06-07 NOTE — Discharge Instructions (Signed)

## 2015-06-07 NOTE — ED Notes (Signed)
Presents with hematuria and small clots with urination as well as lower abdominal pain-"It feels like a cystitis" pain began yesterday and went away and returned today worse. Endorses dysuria, hematuria.

## 2015-06-07 NOTE — ED Provider Notes (Signed)
CSN: [id number]     Arrival date & time 06/07/15  2121 History   First MD Initiated Contact with Patient 06/07/15 2200     Chief Complaint  Patient presents with  . Hematuria   HPI  Melinda [name] is a 54 year old female with PMHx of DM and GERD presenting with hematuria and dysuria. She reports onset of symptoms "a few days ago". She reports pink colored urine. Associated symptoms include suprapubic discomfort. She states that this feels like her typical UTI but she has never experienced hematuria before. She has not taken any OTC medications for symptom control. She reports thinking she had a kidney stone so she strained her urine. She reports small blood clots caught in the strainer. Denies fevers, chills, headache, dizziness, syncope, weakness, chest pain, SOB, nausea, vomiting, flank pain, vaginal discharge or pelvic pain.   Past Medical History  Diagnosis Date  . Dyslipidemia     untreated  . GERD (gastroesophageal reflux disease)   . Palpitation     positive tilt table test.   . Microcytic anemia 06/22/2012  . Diabetes mellitus type 2, controlled ([place])   . Nephrolith    Past Surgical History  Procedure Laterality Date  . Cardiac catheterization  2002    normal LV function and no significant coronary obstruction  . Tonsillectomy    . Kidney surgery    . Appendectomy    . Partial hysterectomy    . Breast reduction surgery     Family History  Problem Relation Age of Onset  . Coronary artery disease      Fx of  . Heart attack    . Diabetes    . Heart disease Mother   . Liver disease Mother   . Heart disease Father   . Heart attack Father   . Cancer Father     prostate CA  . Cancer Maternal Grandmother 54    ovarian cancer   Social History  Substance Use Topics  . Smoking status: Former Smoker    Quit date: 07/08/2010  . Smokeless tobacco: None     Comment: uses electronic  . Alcohol Use: No   OB History    No data available     Review of Systems   Genitourinary: Positive for dysuria and hematuria.  All other systems reviewed and are negative.     Allergies  Amoclan; Amoxapine and related; Antihistamines, diphenhydramine-type; Ceclor; Cephalexin; Ciprofloxacin; Codeine; Erythromycin; Fish oil; Flagyl; Floxin; Levaquin; Macrobid; Macrodantin; Other; Prednisolone; Promethazine hcl; Septra; Tetracyclines & related; and Vibramycin  Home Medications   Prior to Admission medications   Medication Sig Start Date End Date Taking? Authorizing Provider  cephALEXin (KEFLEX) 500 MG capsule Take 1 capsule (500 mg total) by mouth 2 (two) times daily. 06/07/15   Bill Mcvey, PA-C  clonazePAM (KLONOPIN) 0.5 MG tablet Take 0.5 mg by mouth 2 (two) times daily as needed for anxiety (for anxiety).  01/25/11   Historical Provider, MD  CRESTOR 5 MG tablet Take 5 mg by mouth daily. 08/10/14   Historical Provider, MD  diazepam (VALIUM) 2 MG tablet Take one tablet by mouth 30 minutes prior to MRI/ MRA 11/06/14   Fay Records, MD  esomeprazole (NEXIUM) 40 MG capsule Take 40 mg by mouth daily at 12 noon.    Historical Provider, MD  FLUoxetine (PROZAC) 20 MG tablet Take 20 mg by mouth daily.     Historical Provider, MD  IRON PO Take 150 mg by mouth every other day.  Historical Provider, MD  nitroGLYCERIN (NITROSTAT) 0.4 MG SL tablet Place 0.4 mg under the tongue every 5 (five) minutes as needed for chest pain.    Historical Provider, MD  propranolol (INDERAL) 20 MG tablet Take 0.5 tablets (10 mg total) by mouth daily. 09/17/13   Fay Records, MD   BP 139/87 mmHg  Pulse 76  Temp(Src) 97.5 F (36.4 C) (Oral)  Resp 18  Ht 5\' 6"  (1.676 m)  Wt 72.576 kg  BMI 25.84 kg/m2  SpO2 98% Physical Exam  Constitutional: She appears well-developed and well-nourished. No distress.  HENT:  Head: Normocephalic and atraumatic.  Eyes: Conjunctivae are normal. Right eye exhibits no discharge. Left eye exhibits no discharge. No scleral icterus.  Neck: Normal range of  motion.  Cardiovascular: Normal rate and regular rhythm.   Pulmonary/Chest: Effort normal. No respiratory distress.  Abdominal: Soft. She exhibits no distension. There is tenderness in the suprapubic area. There is no rebound and no guarding.  No CVA tenderness  Musculoskeletal: Normal range of motion.  Neurological: She is alert. Coordination normal.  Skin: Skin is warm and dry.  Psychiatric: She has a normal mood and affect. Her behavior is normal.  Nursing note and vitals reviewed.   ED Course  Procedures (including critical care time) Labs Review Labs Reviewed  URINALYSIS, ROUTINE W REFLEX MICROSCOPIC (NOT AT [facility]) - Abnormal; Notable for the following:    APPearance CLOUDY (*)    Specific Gravity, Urine 1.003 (*)    Hgb urine dipstick LARGE (*)    Leukocytes, UA LARGE (*)    All other components within normal limits  URINE MICROSCOPIC-ADD ON - Abnormal; Notable for the following:    Squamous Epithelial / LPF 0-5 (*)    Bacteria, UA FEW (*)    All other components within normal limits    Imaging Review No results found. I have personally reviewed and evaluated these images and lab results as part of my medical decision-making.   EKG Interpretation None      MDM   Final diagnoses:  Hemorrhagic cystitis   Pt presenting with dysuria, hematuria and suprapubic discomfort. VSS. Abdomen is soft with suprapubic tenderness. No CVA tenderness or peritoneal signs. UA positive for leukocytes, Hgb and bacteria. Pt has been diagnosed with a UTI. Pt has listed allergy to keflex tabs but states she can take keflex capsules. Given first dose in ED and monitored for reaction. No nausea, itching or other side effect from abx. Will discharge with keflex and instructions to follow up with PCP if symptoms persist. Return precautions given in discharge paperwork and discussed with pt at bedside. Pt stable for discharge      Josephina Gip, PA-C 06/07/15 [place], MD 06/09/15  [phone]

## 2015-07-03 ENCOUNTER — Other Ambulatory Visit: Payer: Self-pay | Admitting: Gastroenterology

## 2015-07-03 ENCOUNTER — Other Ambulatory Visit: Payer: Self-pay | Admitting: Physician Assistant

## 2015-07-03 ENCOUNTER — Ambulatory Visit
Admission: RE | Admit: 2015-07-03 | Discharge: 2015-07-03 | Disposition: A | Discharge status: Home / Self Care | Payer: Managed Care, Other (non HMO) | Source: Ambulatory Visit | Attending: Physician Assistant | Admitting: Physician Assistant

## 2015-07-03 DIAGNOSIS — R140 Abdominal distension (gaseous): Secondary | ICD-10-CM

## 2015-07-03 DIAGNOSIS — K5909 Other constipation: Secondary | ICD-10-CM

## 2015-07-03 DIAGNOSIS — R109 Unspecified abdominal pain: Secondary | ICD-10-CM

## 2015-07-03 DIAGNOSIS — R1030 Lower abdominal pain, unspecified: Secondary | ICD-10-CM

## 2015-07-08 ENCOUNTER — Ambulatory Visit
Admission: RE | Admit: 2015-07-08 | Discharge: 2015-07-08 | Disposition: A | Discharge status: Home / Self Care | Payer: Managed Care, Other (non HMO) | Source: Ambulatory Visit | Attending: Gastroenterology | Admitting: Gastroenterology

## 2015-07-08 DIAGNOSIS — R1030 Lower abdominal pain, unspecified: Secondary | ICD-10-CM

## 2015-08-04 ENCOUNTER — Other Ambulatory Visit: Payer: Self-pay | Admitting: *Deleted

## 2015-08-04 ENCOUNTER — Telehealth: Payer: Self-pay | Admitting: Internal Medicine

## 2015-08-04 NOTE — Telephone Encounter (Signed)
Pt reports have "indigestion type thing" pressure, "like I have to burp".   Also has tingling that goes across both shoulders.  She attributes this to poor posture and is seeing a chiropractor tonight to see if this helps.  Symptoms have been constant for about a week. She was instructed by GI to increase Nexium to twice a day and contact her heart dr.  She has not moved her bowels in a week, takes senekot and has been out of it.  She will get more senekot and restart. She denies nausea, sob, sweating.  The abd/chest pressure is constant.  Nothing makes it worse or better.  "I am in a tailspin because my healthy brother ended up having a 5 vessel bypass last month."  Scheduled patient for this 08/08/15 with Dr. Harrington Challenger.  She is due for annual visit.  Advised her to call back or go to ED for eval if symptoms worsen or are accompanied by other above symptoms.   She is aware I am forwarding to Dr. Harrington Challenger to review and if any other recommendations we will call her back. Pt verbalizes understanding and agreement with this plan.

## 2015-08-04 NOTE — Telephone Encounter (Signed)
Pt says she have called her GI doctor and they told her to call here. She have been having pressure in the center of her chest,burping and feels like she needs to burp. Pt also hurting across her back . She have increased her Nexium to 2 pills a day ,this was 2 days ago. This feeling stays with her all the time.pt is very concerned.

## 2015-08-07 NOTE — Progress Notes (Signed)
Cardiology Office Note   Date:  08/08/2015   ID:  Melinda [name], DOB [birth year], MRN [id number]  PCP:  Shirline Frees, MD  Cardiologist:   Dorris Carnes, MD   F/U of CP      History of Present Illness: Melinda [name] is a 55 y.o. female with a history of  CP  Previously followed by T Stuck  Had stresss tst, cardiac cath and cardiac CT in past  Negative I saw her in April 2016 Pt called in on 08/04/15 with complaints of an indigestion senstation  Acrross shoulders  Gonstant for a week  Constipated.  Called GI  Told by GI  to call here    Indigstion for 2 wks  Pressure  Also discomfort in L Shoulder  Taking nexium 2x per day for 4 to 5 days  SOme better  But,  still some pressure  Constant   Went to chriropractor earlier today who Adjusted back  This helped some with shoulder discomfort          Outpatient Prescriptions Prior to Visit  Medication Sig Dispense Refill  . cephALEXin (KEFLEX) 500 MG capsule Take 1 capsule (500 mg total) by mouth 2 (two) times daily. 14 capsule 0  . clonazePAM (KLONOPIN) 0.5 MG tablet Take 0.5 mg by mouth 2 (two) times daily as needed for anxiety (for anxiety).     . CRESTOR 5 MG tablet Take 5 mg by mouth daily.  1  . esomeprazole (NEXIUM) 40 MG capsule Take 40 mg by mouth daily at 12 noon.    [name] FLUoxetine (PROZAC) 20 MG tablet Take 20 mg by mouth daily.     . IRON PO Take 150 mg by mouth every other day.     . nitroGLYCERIN (NITROSTAT) 0.4 MG SL tablet Place 0.4 mg under the tongue every 5 (five) minutes as needed for chest pain.    [name] propranolol (INDERAL) 20 MG tablet Take 0.5 tablets (10 mg total) by mouth daily.    . diazepam (VALIUM) 2 MG tablet Take one tablet by mouth 30 minutes prior to MRI/ MRA (Patient not taking: Reported on 08/08/2015) 1 tablet 0   No facility-administered medications prior to visit.     Allergies:   Amoclan; Amoxapine and related; Antihistamines, diphenhydramine-type; Ceclor; Cephalexin; Ciprofloxacin; Codeine;  Erythromycin; Fish oil; Flagyl; Floxin; Levaquin; Macrobid; Macrodantin; Other; Prednisolone; Promethazine hcl; Septra; Tetracyclines & related; and Vibramycin   Past Medical History  Diagnosis Date  . Dyslipidemia     untreated  . GERD (gastroesophageal reflux disease)   . Palpitation     positive tilt table test.   . Microcytic anemia 06/22/2012  . Diabetes mellitus type 2, controlled ([place])   . Nephrolith     Past Surgical History  Procedure Laterality Date  . Cardiac catheterization  2002    normal LV function and no significant coronary obstruction  . Tonsillectomy    . Kidney surgery    . Appendectomy    . Partial hysterectomy    . Breast reduction surgery       Social History:  The patient  reports that she quit smoking about 5 years ago. She does not have any smokeless tobacco history on file. She reports that she does not drink alcohol or use illicit drugs.   Family History:  The patient's family history includes Cancer in her father; Cancer (age of onset: 58) in her maternal grandmother; Heart attack in her father; Heart disease in her father and mother;  Liver disease in her mother.    ROS:  Please see the history of present illness. All other systems are reviewed and  Negative to the above problem except as noted.    PHYSICAL EXAM: VS:  BP 121/84 mmHg  Pulse 72  Ht 5\' 6"  (1.676 m)  Wt 167 lb 9.6 oz (76.023 kg)  BMI 27.06 kg/m2  GEN: Well nourished, well developed, in no acute distress HEENT: normal Neck: no JVD, carotid bruits, or masses Cardiac: RRR; no murmurs, rubs, or gallops,no edema  Respiratory:  clear to auscultation bilaterally, normal work of breathing GI: soft, nontender, nondistended, + BS  No hepatomegaly  MS: no deformity Moving all extremities   Skin: warm and dry, no rash Neuro:  Strength and sensation are intact Psych: euthymic mood, full affect   EKG:  EKG is ordered today.  SR 72 bpm    Lipid Panel No results found for: CHOL, TRIG,  HDL, CHOLHDL, VLDL, LDLCALC, LDLDIRECT    Wt Readings from Last 3 Encounters:  08/08/15 167 lb 9.6 oz (76.023 kg)  06/07/15 160 lb (72.576 kg)  09/30/14 158 lb 6.4 oz (71.85 kg)      ASSESSMENT AND PLAN:  1 Chest prssure  Atypical  Pt very anxous because brother had multivessel dz  Would set up forstress echo  Has GI f/u next wk Check CBC  2.  HL get lipids when she comes back in for stress   Current medicines are reviewed at length with the patient today.  The patient does not have concerns regarding medicines.   Disposition:   FU with me tentatively in 1 year  Sooner if test positive    Signed, Dorris Carnes, MD  08/08/2015 7:00 PM    [address] [place], [place], [place]  [address] Phone: [phone]; Fax: [phone]

## 2015-08-08 ENCOUNTER — Ambulatory Visit (INDEPENDENT_AMBULATORY_CARE_PROVIDER_SITE_OTHER): Payer: Managed Care, Other (non HMO) | Admitting: Internal Medicine

## 2015-08-08 ENCOUNTER — Encounter: Payer: Self-pay | Admitting: Internal Medicine

## 2015-08-08 VITALS — BP 121/84 | HR 72 | Ht 66.0 in | Wt 167.6 lb

## 2015-08-08 DIAGNOSIS — R002 Palpitations: Secondary | ICD-10-CM

## 2015-08-08 DIAGNOSIS — R0602 Shortness of breath: Secondary | ICD-10-CM | POA: Diagnosis not present

## 2015-08-08 DIAGNOSIS — R0789 Other chest pain: Secondary | ICD-10-CM

## 2015-08-08 DIAGNOSIS — E785 Hyperlipidemia, unspecified: Secondary | ICD-10-CM | POA: Diagnosis not present

## 2015-08-08 NOTE — Patient Instructions (Signed)
Your physician recommends that you continue on your current medications as directed. Please refer to the Current Medication list given to you today.  Your physician has requested that you have a stress echocardiogram. For further information please visit [email or web address]. Please follow instruction sheet as given.  Your physician recommends that you return for lab work on the day you come for the Stress Echo  Your physician wants you to follow-up in: 1 year with Dr. Harrington Challenger.  You will receive a reminder letter in the mail two months in advance. If you don't receive a letter, please call our office to schedule the follow-up appointment.

## 2015-08-12 ENCOUNTER — Other Ambulatory Visit: Payer: Self-pay | Admitting: *Deleted

## 2015-08-12 MED ORDER — NITROGLYCERIN 0.4 MG SL SUBL: 0.4000 mg | SUBLINGUAL_TABLET | SUBLINGUAL | Status: DC | PRN

## 2015-08-21 ENCOUNTER — Encounter: Payer: Self-pay | Admitting: Internal Medicine

## 2015-08-25 ENCOUNTER — Ambulatory Visit (HOSPITAL_COMMUNITY): Payer: Managed Care, Other (non HMO) | Attending: Cardiology

## 2015-08-25 ENCOUNTER — Other Ambulatory Visit (INDEPENDENT_AMBULATORY_CARE_PROVIDER_SITE_OTHER): Payer: Managed Care, Other (non HMO) | Admitting: *Deleted

## 2015-08-25 ENCOUNTER — Ambulatory Visit (HOSPITAL_BASED_OUTPATIENT_CLINIC_OR_DEPARTMENT_OTHER): Payer: Managed Care, Other (non HMO)

## 2015-08-25 DIAGNOSIS — R002 Palpitations: Secondary | ICD-10-CM

## 2015-08-25 DIAGNOSIS — E785 Hyperlipidemia, unspecified: Secondary | ICD-10-CM | POA: Insufficient documentation

## 2015-08-25 DIAGNOSIS — R0789 Other chest pain: Secondary | ICD-10-CM

## 2015-08-25 DIAGNOSIS — R0600 Dyspnea, unspecified: Secondary | ICD-10-CM | POA: Diagnosis present

## 2015-08-25 DIAGNOSIS — R0602 Shortness of breath: Secondary | ICD-10-CM | POA: Diagnosis not present

## 2015-08-25 LAB — LIPID PANEL
CHOLESTEROL: 159 mg/dL (ref 125–200)
HDL: 29 mg/dL — ABNORMAL LOW (ref >=46)
LDL Cholesterol: 68 mg/dL (ref <130)
Total CHOL/HDL Ratio: 5.5 Ratio — ABNORMAL HIGH (ref <=5.0)
Triglycerides: 310 mg/dL — ABNORMAL HIGH (ref <150)
VLDL: 62 mg/dL — ABNORMAL HIGH (ref <30)

## 2015-08-28 ENCOUNTER — Telehealth: Payer: Self-pay | Admitting: Internal Medicine

## 2015-08-28 NOTE — Telephone Encounter (Signed)
New message ° ° ° ° °Returning a call to the nurse °

## 2015-08-28 NOTE — Telephone Encounter (Signed)
Reviewed results with patient. 

## 2016-02-28 ENCOUNTER — Emergency Department (HOSPITAL_COMMUNITY): Payer: Managed Care, Other (non HMO)

## 2016-02-28 ENCOUNTER — Emergency Department (HOSPITAL_COMMUNITY)
Admission: EM | Admit: 2016-02-28 | Discharge: 2016-02-28 | Disposition: A | Discharge status: Home / Self Care | Payer: Managed Care, Other (non HMO) | Attending: Emergency Medicine | Admitting: Emergency Medicine

## 2016-02-28 ENCOUNTER — Encounter (HOSPITAL_COMMUNITY): Payer: Self-pay

## 2016-02-28 DIAGNOSIS — I10 Essential (primary) hypertension: Secondary | ICD-10-CM | POA: Insufficient documentation

## 2016-02-28 DIAGNOSIS — N23 Unspecified renal colic: Secondary | ICD-10-CM

## 2016-02-28 DIAGNOSIS — E119 Type 2 diabetes mellitus without complications: Secondary | ICD-10-CM | POA: Insufficient documentation

## 2016-02-28 DIAGNOSIS — N1330 Unspecified hydronephrosis: Secondary | ICD-10-CM | POA: Insufficient documentation

## 2016-02-28 DIAGNOSIS — Z87891 Personal history of nicotine dependence: Secondary | ICD-10-CM | POA: Diagnosis not present

## 2016-02-28 DIAGNOSIS — N201 Calculus of ureter: Secondary | ICD-10-CM | POA: Diagnosis not present

## 2016-02-28 DIAGNOSIS — R109 Unspecified abdominal pain: Secondary | ICD-10-CM | POA: Diagnosis present

## 2016-02-28 DIAGNOSIS — N132 Hydronephrosis with renal and ureteral calculous obstruction: Secondary | ICD-10-CM

## 2016-02-28 LAB — BASIC METABOLIC PANEL
Anion gap: 8 (ref 5–15)
BUN: 18 mg/dL (ref 6–20)
CHLORIDE: 108 mmol/L (ref 101–111)
CO2: 24 mmol/L (ref 22–32)
CREATININE: 0.84 mg/dL (ref 0.44–1.00)
Calcium: 9.5 mg/dL (ref 8.9–10.3)
Glucose, Bld: 204 mg/dL — ABNORMAL HIGH (ref 65–99)
POTASSIUM: 4.3 mmol/L (ref 3.5–5.1)
SODIUM: 140 mmol/L (ref 135–145)

## 2016-02-28 LAB — URINE MICROSCOPIC-ADD ON

## 2016-02-28 LAB — CBC
HCT: 35 % — ABNORMAL LOW (ref 36.0–46.0)
HEMOGLOBIN: 10.2 g/dL — AB (ref 12.0–15.0)
MCH: 21.2 pg — ABNORMAL LOW (ref 26.0–34.0)
MCHC: 29.1 g/dL — AB (ref 30.0–36.0)
MCV: 72.8 fL — ABNORMAL LOW (ref 78.0–100.0)
Platelets: 304 10*3/uL (ref 150–400)
RBC: 4.81 MIL/uL (ref 3.87–5.11)
RDW: 14.6 % (ref 11.5–15.5)
WBC: 6.8 10*3/uL (ref 4.0–10.5)

## 2016-02-28 LAB — URINALYSIS, ROUTINE W REFLEX MICROSCOPIC
Bilirubin Urine: NEGATIVE
Glucose, UA: NEGATIVE mg/dL
KETONES UR: NEGATIVE mg/dL
LEUKOCYTES UA: NEGATIVE
NITRITE: NEGATIVE
PROTEIN: NEGATIVE mg/dL
Specific Gravity, Urine: 1.022 (ref 1.005–1.030)
pH: 5.5 (ref 5.0–8.0)

## 2016-02-28 MED ORDER — HYDROCODONE-ACETAMINOPHEN 5-325 MG PO TABS
1.0000 | ORAL_TABLET | Freq: Once | ORAL | Status: AC
  Administered 2016-02-28: 1 via ORAL
  Filled 2016-02-28: qty 1

## 2016-02-28 MED ORDER — ONDANSETRON HCL 4 MG/2ML IJ SOLN
4.0000 mg | Freq: Once | INTRAMUSCULAR | Status: AC
  Administered 2016-02-28: 4 mg via INTRAVENOUS
  Filled 2016-02-28: qty 2

## 2016-02-28 MED ORDER — KETOROLAC TROMETHAMINE 30 MG/ML IJ SOLN: 30.0000 mg | Freq: Once | INTRAMUSCULAR | Status: DC

## 2016-02-28 MED ORDER — ONDANSETRON HCL 4 MG PO TABS: 4.0000 mg | ORAL_TABLET | Freq: Four times a day (QID) | ORAL | 0 refills | Status: DC | PRN

## 2016-02-28 MED ORDER — HYDROCODONE-ACETAMINOPHEN 5-325 MG PO TABS: 1.0000 | ORAL_TABLET | Freq: Four times a day (QID) | ORAL | 0 refills | Status: DC | PRN

## 2016-02-28 MED ORDER — HYDROMORPHONE HCL 1 MG/ML IJ SOLN: 1.0000 mg | Freq: Once | INTRAMUSCULAR | Status: DC

## 2016-02-28 MED ORDER — SODIUM CHLORIDE 0.9 % IV BOLUS (SEPSIS)
1000.0000 mL | Freq: Once | INTRAVENOUS | Status: AC
  Administered 2016-02-28: 1000 mL via INTRAVENOUS

## 2016-02-28 MED ORDER — HYDROMORPHONE HCL 1 MG/ML IJ SOLN
0.5000 mg | Freq: Once | INTRAMUSCULAR | Status: AC
  Administered 2016-02-28: 0.5 mg via INTRAVENOUS
  Filled 2016-02-28: qty 1

## 2016-02-28 NOTE — ED Provider Notes (Signed)
[facility] DEPT Provider Note   CSN: [id number] Arrival date & time: 02/28/16  1316     History   Chief Complaint Chief Complaint  Patient presents with  . Flank Pain    HPI Melinda [name] is a 55 y.o. female.  HPI 55 year old female who presents with left flank pain. History of DM, appendectomy, hysterectomy and h/o exploratory laparoscopy. History of kidney stones in her early twenties, but unsure of what that felt like. Sudden onset severe left flank pain this morning at 9 AM, while lying in bed. Associated with urinary urgency. Pain sharp, radiates to left groin. Nausea but no vomiting. No fever or chills, hematuria, dysuria, or diarrhea. Follows Dr. Myles Rosenthal from urology for recurrent pyelonephritis. No aggravating or alleviating factors. Has not tried any medications.  Past Medical History:  Diagnosis Date  . Diabetes mellitus type 2, controlled ([place])   . Dyslipidemia    untreated  . GERD (gastroesophageal reflux disease)   . Microcytic anemia 06/22/2012  . Nephrolith   . Palpitation    positive tilt table test.     Patient Active Problem List   Diagnosis Date Noted  . Chest pain 06/26/2012  . Microcytic anemia 06/22/2012  . Near syncope 06/20/2012  . Heart palpitations 04/28/2011  . Hypertension 04/28/2011  . Dyslipidemia 04/28/2011    Past Surgical History:  Procedure Laterality Date  . APPENDECTOMY    . BREAST REDUCTION SURGERY    . CARDIAC CATHETERIZATION  2002   normal LV function and no significant coronary obstruction  . KIDNEY SURGERY    . PARTIAL HYSTERECTOMY    . TONSILLECTOMY      OB History    No data available       Home Medications    Prior to Admission medications   Medication Sig Start Date End Date Taking? Authorizing Provider  cephALEXin (KEFLEX) 250 MG capsule Take 250 mg by mouth at bedtime.   Yes Historical Provider, MD  clonazePAM (KLONOPIN) 0.5 MG tablet Take 0.5 mg by mouth daily.  01/25/11  Yes Historical Provider, MD    CRESTOR 5 MG tablet Take 5 mg by mouth every evening.  08/10/14  Yes Historical Provider, MD  diazepam (VALIUM) 10 MG tablet Take 10 mg by mouth at bedtime.   Yes Historical Provider, MD  esomeprazole (NEXIUM) 40 MG capsule Take 40 mg by mouth daily at 12 noon.   Yes Historical Provider, MD  FLUoxetine (PROZAC) 20 MG tablet Take 20 mg by mouth daily.    Yes Historical Provider, MD  IRON PO Take 150 mg by mouth every other day.    Yes Historical Provider, MD  nitroGLYCERIN (NITROSTAT) 0.4 MG SL tablet Place 1 tablet (0.4 mg total) under the tongue every 5 (five) minutes as needed for chest pain. 08/12/15  Yes Fay Records, MD  propranolol (INDERAL) 20 MG tablet Take 0.5 tablets (10 mg total) by mouth daily. 09/17/13  Yes Fay Records, MD  HYDROcodone-acetaminophen (NORCO/VICODIN) 5-325 MG tablet Take 1 tablet by mouth every 6 (six) hours as needed for moderate pain or severe pain. 02/28/16   Forde Dandy, MD  ondansetron (ZOFRAN) 4 MG tablet Take 1 tablet (4 mg total) by mouth every 6 (six) hours as needed for nausea or vomiting. 02/28/16   Forde Dandy, MD    Family History Family History  Problem Relation Age of Onset  . Heart disease Mother   . Liver disease Mother   . Heart disease Father   .  Heart attack Father   . Cancer Father     prostate CA  . Cancer Maternal Grandmother 60    ovarian cancer  . Coronary artery disease      Fx of  . Heart attack    . Diabetes      Social History Social History  Substance Use Topics  . Smoking status: Former Smoker    Quit date: 07/08/2010  . Smokeless tobacco: Never Used     Comment: uses electronic  . Alcohol use No     Allergies   Amoclan [amoxicillin-pot clavulanate]; Amoxapine and related; Antihistamines, diphenhydramine-type; Ceclor [cefaclor]; Cephalexin; Ciprofloxacin; Codeine; Erythromycin; Fish oil; Flagyl [metronidazole hcl]; Floxin [ofloxacin]; Levaquin [levofloxacin in d5w]; Macrobid [nitrofurantoin monohyd macro]; Macrodantin;  Other; Prednisolone; Promethazine hcl; Septra [sulfamethoxazole-trimethoprim]; Tetracyclines & related; and Vibramycin [doxycycline calcium]   Review of Systems Review of Systems 10/14 systems reviewed and are negative other than those stated in the HPI   Physical Exam Updated Vital Signs BP 103/56   Pulse 72   Temp 98 F (36.7 C) (Oral)   Resp 16   SpO2 93%   Physical Exam Physical Exam  Nursing note and vitals reviewed. Constitutional: Well developed, well nourished, non-toxic, and in no acute distress Head: Normocephalic and atraumatic.  Mouth/Throat: Oropharynx is clear and moist.  Neck: Normal range of motion. Neck supple.  Cardiovascular: Normal rate and regular rhythm.   Pulmonary/Chest: Effort normal and breath sounds normal.  Abdominal: Soft. There is no tenderness. There is no rebound and no guarding. Left CVA tenderness. Musculoskeletal: Normal range of motion.  Neurological: Alert, no facial droop, fluent speech, moves all extremities symmetrically Skin: Skin is warm and dry.  Psychiatric: Cooperative   ED Treatments / Results  Labs (all labs ordered are listed, but only abnormal results are displayed) Labs Reviewed  URINALYSIS, ROUTINE W REFLEX MICROSCOPIC (NOT AT [facility]) - Abnormal; Notable for the following:       Result Value   Hgb urine dipstick LARGE (*)    All other components within normal limits  CBC - Abnormal; Notable for the following:    Hemoglobin 10.2 (*)    HCT 35.0 (*)    MCV 72.8 (*)    MCH 21.2 (*)    MCHC 29.1 (*)    All other components within normal limits  BASIC METABOLIC PANEL - Abnormal; Notable for the following:    Glucose, Bld 204 (*)    All other components within normal limits  URINE MICROSCOPIC-ADD ON - Abnormal; Notable for the following:    Squamous Epithelial / LPF 0-5 (*)    Bacteria, UA FEW (*)    All other components within normal limits    EKG  EKG Interpretation None       Radiology Ct Renal Stone  Study  Result Date: 02/28/2016 CLINICAL DATA:  Left flank/back pain, urinary frequency EXAM: CT ABDOMEN AND PELVIS WITHOUT CONTRAST TECHNIQUE: Multidetector CT imaging of the abdomen and pelvis was performed following the standard protocol without IV contrast. COMPARISON:  09/04/2015 FINDINGS: Lower chest: Mild dependent atelectasis in the bilateral lower lobes. Hepatobiliary: Moderate hepatic steatosis. Gallbladder is unremarkable. No intrahepatic or extrahepatic ductal dilatation. Pancreas: Within normal limits. Spleen: Within normal limits. Adrenals/Urinary Tract: Adrenal glands are within normal limits. Mild cortical scarring in the lateral right upper kidney. Left kidney is within normal limits. Mild left hydroureteronephrosis. Associated 3 mm distal left ureteral calculus at the UVJ (series 2/ image 84). Bladder is within normal limits. Stomach/Bowel: Stomach is within  normal limits. No evidence of bowel obstruction. Appendix is notable for multiple distal appendicoliths but no associated inflammatory changes. Vascular/Lymphatic: No evidence of abdominal aortic aneurysm. Atherosclerotic calcifications of the abdominal aorta and branch vessels. No suspicious abdominopelvic lymphadenopathy. Reproductive: Status post hysterectomy. No adnexal masses. Other: No abdominopelvic ascites. Musculoskeletal: Mild degenerative changes of the lower lumbar spine. IMPRESSION: 3 mm distal left ureteral calculus at the UVJ. Mild left hydronephrosis. Moderate hepatic steatosis. Additional ancillary findings as above. Electronically Signed   By: Julian Hy M.D.   On: 02/28/2016 17:14    Procedures Procedures (including critical care time)  Medications Ordered in ED Medications  sodium chloride 0.9 % bolus 1,000 mL (0 mLs Intravenous Stopped 02/28/16 1826)  ondansetron (ZOFRAN) injection 4 mg (4 mg Intravenous Given 02/28/16 1522)  HYDROmorphone (DILAUDID) injection 0.5 mg (0.5 mg Intravenous Given 02/28/16 1523)   HYDROcodone-acetaminophen (NORCO/VICODIN) 5-325 MG per tablet 1 tablet (1 tablet Oral Given 02/28/16 1822)     Initial Impression / Assessment and Plan / ED Course  I have reviewed the triage vital signs and the nursing notes.  Pertinent labs & imaging results that were available during my care of the patient were reviewed by me and considered in my medical decision making (see chart for details).  Clinical Course   Left flank pain of sudden onset today. DDX include pyelonephritis, urolithiasis, less likely diverticulitis, or aortic pathology.  Afebrile and HD stable. With benign and soft abdomen and left CVA tenderness. UA w/ blood but no infection. Normal renal function. No leukocytosis. Suspect urolithiasis, but patient states she does not think this is similar to that. CT renal stone w/ 3 mm left UVJ stone. No concern for infection. Pain well controlled with dilaudid and then Vicodin. Appropriate for discharge. To follow-up with her outpatient urologist. Strict return and follow-up instructions reviewed. She expressed understanding of all discharge instructions and felt comfortable with the plan of care.   Final Clinical Impressions(s) / ED Diagnoses   Final diagnoses:  Ureteral stone with hydronephrosis  Ureteral colic    New Prescriptions Discharge Medication List as of 02/28/2016  6:47 PM    START taking these medications   Details  HYDROcodone-acetaminophen (NORCO/VICODIN) 5-325 MG tablet Take 1 tablet by mouth every 6 (six) hours as needed for moderate pain or severe pain., Starting Sat 02/28/2016, Print    ondansetron (ZOFRAN) 4 MG tablet Take 1 tablet (4 mg total) by mouth every 6 (six) hours as needed for nausea or vomiting., Starting Sat 02/28/2016, Print         Forde Dandy, MD 02/29/16 [phone]

## 2016-02-28 NOTE — Discharge Instructions (Signed)
You have a 3 mm kidney stone in the left ureter that is causing you pain.  Take medications as prescribed.  Call your urologist for close follow-up.  Return for worsening symptoms, including fever, intractable vomiting, escalating pain or any other symptoms concerning to you.

## 2016-02-28 NOTE — ED Triage Notes (Signed)
Patient was awakened this am with severe left flank pain radiating to groin, nausea with same. Patient pale on arrival and states that she has also ben having some problems with anemia, no vomiting, no diarrhea

## 2016-05-06 ENCOUNTER — Encounter: Payer: Self-pay | Admitting: Physician Assistant

## 2016-05-06 ENCOUNTER — Ambulatory Visit (INDEPENDENT_AMBULATORY_CARE_PROVIDER_SITE_OTHER): Payer: Managed Care, Other (non HMO) | Admitting: Physician Assistant

## 2016-05-06 VITALS — BP 106/78 | HR 88 | Ht 67.0 in | Wt 173.5 lb

## 2016-05-06 DIAGNOSIS — E119 Type 2 diabetes mellitus without complications: Secondary | ICD-10-CM | POA: Diagnosis not present

## 2016-05-06 DIAGNOSIS — R55 Syncope and collapse: Secondary | ICD-10-CM

## 2016-05-06 DIAGNOSIS — R42 Dizziness and giddiness: Secondary | ICD-10-CM | POA: Diagnosis not present

## 2016-05-06 NOTE — Patient Instructions (Addendum)
Rosaria Ferries, PA-C recommends that you follow a diabetic diet. You can find information below.  Please check your blood sugar once or twice a day at various times throughout the day. Keep a record to show to your primary care physician. This record should include the date and time, blood sugar readings, AND the number of meals you have eaten.  Drink at least 1.5 liters of water daily.  Suanne Marker recommends that you schedule a follow-up appointment in MARCH 2018 with Dr Harrington Challenger.  If you need a refill on your cardiac medications before your next appointment, please call your pharmacy.    Diabetes Mellitus and Food It is important for you to manage your blood sugar (glucose) level. Your blood glucose level can be greatly affected by what you eat. Eating healthier foods in the appropriate amounts throughout the day at about the same time each day will help you control your blood glucose level. It can also help slow or prevent worsening of your diabetes mellitus. Healthy eating may even help you improve the level of your blood pressure and reach or maintain a healthy weight. General recommendations for healthful eating and cooking habits include:  Eating meals and snacks regularly. Avoid going long periods of time without eating to lose weight.  Eating a diet that consists mainly of plant-based foods, such as fruits, vegetables, nuts, legumes, and whole grains.  Using low-heat cooking methods, such as baking, instead of high-heat cooking methods, such as deep frying. Work with your dietitian to make sure you understand how to use the Nutrition Facts information on food labels. How can food affect me? Carbohydrates  Carbohydrates affect your blood glucose level more than any other type of food. Your dietitian will help you determine how many carbohydrates to eat at each meal and teach you how to count carbohydrates. Counting carbohydrates is important to keep your blood glucose at a healthy level,  especially if you are using insulin or taking certain medicines for diabetes mellitus. Alcohol  Alcohol can cause sudden decreases in blood glucose (hypoglycemia), especially if you use insulin or take certain medicines for diabetes mellitus. Hypoglycemia can be a life-threatening condition. Symptoms of hypoglycemia (sleepiness, dizziness, and disorientation) are similar to symptoms of having too much alcohol. If your health care provider has given you approval to drink alcohol, do so in moderation and use the following guidelines:  Women should not have more than one drink per day, and men should not have more than two drinks per day. One drink is equal to:  12 oz of beer.  5 oz of wine.  1 oz of hard liquor.  Do not drink on an empty stomach.  Keep yourself hydrated. Have water, diet soda, or unsweetened iced tea.  Regular soda, juice, and other mixers might contain a lot of carbohydrates and should be counted. What foods are not recommended? As you make food choices, it is important to remember that all foods are not the same. Some foods have fewer nutrients per serving than other foods, even though they might have the same number of calories or carbohydrates. It is difficult to get your body what it needs when you eat foods with fewer nutrients. Examples of foods that you should avoid that are high in calories and carbohydrates but low in nutrients include:  Trans fats (most processed foods list trans fats on the Nutrition Facts label).  Regular soda.  Juice.  Candy.  Sweets, such as cake, pie, doughnuts, and cookies.  Fried foods. What  foods can I eat? Eat nutrient-rich foods, which will nourish your body and keep you healthy. The food you should eat also will depend on several factors, including:  The calories you need.  The medicines you take.  Your weight.  Your blood glucose level.  Your blood pressure level.  Your cholesterol level. You should eat a variety of  foods, including:  Protein.  Lean cuts of meat.  Proteins low in saturated fats, such as fish, egg whites, and beans. Avoid processed meats.  Fruits and vegetables.  Fruits and vegetables that may help control blood glucose levels, such as apples, mangoes, and yams.  Dairy products.  Choose fat-free or low-fat dairy products, such as milk, yogurt, and cheese.  Grains, bread, pasta, and rice.  Choose whole grain products, such as multigrain bread, whole oats, and brown rice. These foods may help control blood pressure.  Fats.  Foods containing healthful fats, such as nuts, avocado, olive oil, canola oil, and fish. Does everyone with diabetes mellitus have the same meal plan? Because every person with diabetes mellitus is different, there is not one meal plan that works for everyone. It is very important that you meet with a dietitian who will help you create a meal plan that is just right for you. This information is not intended to replace advice given to you by your health care provider. Make sure you discuss any questions you have with your health care provider. Document Released: 02/18/2005 Document Revised: 10/30/2015 Document Reviewed: 04/20/2013 Elsevier Interactive Patient Education  2017 [employer] for Diabetes Mellitus, Adult Carbohydrate counting is a method for keeping track of how many carbohydrates you eat. Eating carbohydrates naturally increases the amount of sugar (glucose) in the blood. Counting how many carbohydrates you eat helps keep your blood glucose within normal limits, which helps you manage your diabetes (diabetes mellitus). It is important to know how many carbohydrates you can safely have in each meal. This is different for every person. A diet and nutrition specialist (registered dietitian) can help you make a meal plan and calculate how many carbohydrates you should have at each meal and snack. Carbohydrates are found in  the following foods:  Grains, such as breads and cereals.  Dried beans and soy products.  Starchy vegetables, such as potatoes, peas, and corn.  Fruit and fruit juices.  Milk and yogurt.  Sweets and snack foods, such as cake, cookies, candy, chips, and soft drinks. How do I count carbohydrates? There are two ways to count carbohydrates in food. You can use either of the methods or a combination of both. Reading "Nutrition Facts" on packaged food  The "Nutrition Facts" list is included on the labels of almost all packaged foods and beverages in the [place] It includes:  The serving size.  Information about nutrients in each serving, including the grams (g) of carbohydrate per serving. To use the "Nutrition Facts":  Decide how many servings you will have.  Multiply the number of servings by the number of carbohydrates per serving.  The resulting number is the total amount of carbohydrates that you will be having. Learning standard serving sizes of other foods  When you eat foods containing carbohydrates that are not packaged or do not include "Nutrition Facts" on the label, you need to measure the servings in order to count the amount of carbohydrates:  Measure the foods that you will eat with a food scale or measuring cup, if needed.  Decide how  many standard-size servings you will eat.  Multiply the number of servings by 15. Most carbohydrate-rich foods have about 15 g of carbohydrates per serving.  For example, if you eat 8 oz (170 g) of strawberries, you will have eaten 2 servings and 30 g of carbohydrates (2 servings x 15 g = 30 g).  For foods that have more than one food mixed, such as soups and casseroles, you must count the carbohydrates in each food that is included. The following list contains standard serving sizes of common carbohydrate-rich foods. Each of these servings has about 15 g of carbohydrates:   hamburger bun or  English muffin.   oz (15 mL) syrup.    oz (14 g) jelly.  1 slice of bread.  1 six-inch tortilla.  3 oz (85 g) cooked rice or pasta.  4 oz (113 g) cooked dried beans.  4 oz (113 g) starchy vegetable, such as peas, corn, or potatoes.  4 oz (113 g) hot cereal.  4 oz (113 g) mashed potatoes or  of a large baked potato.  4 oz (113 g) canned or frozen fruit.  4 oz (120 mL) fruit juice.  4-6 crackers.  6 chicken nuggets.  6 oz (170 g) unsweetened dry cereal.  6 oz (170 g) plain fat-free yogurt or yogurt sweetened with artificial sweeteners.  8 oz (240 mL) milk.  8 oz (170 g) fresh fruit or one small piece of fruit.  24 oz (680 g) popped popcorn. Example of carbohydrate counting Sample meal  3 oz (85 g) chicken breast.  6 oz (170 g) brown rice.  4 oz (113 g) corn.  8 oz (240 mL) milk.  8 oz (170 g) strawberries with sugar-free whipped topping. Carbohydrate calculation 1. Identify the foods that contain carbohydrates:  Rice.  Corn.  Milk.  Strawberries. 2. Calculate how many servings you have of each food:  2 servings rice.  1 serving corn.  1 serving milk.  1 serving strawberries. 3. Multiply each number of servings by 15 g:  2 servings rice x 15 g = 30 g.  1 serving corn x 15 g = 15 g.  1 serving milk x 15 g = 15 g.  1 serving strawberries x 15 g = 15 g. 4. Add together all of the amounts to find the total grams of carbohydrates eaten:  30 g + 15 g + 15 g + 15 g = 75 g of carbohydrates total. This information is not intended to replace advice given to you by your health care provider. Make sure you discuss any questions you have with your health care provider. Document Released: 05/24/2005 Document Revised: 12/12/2015 Document Reviewed: 11/05/2015 Elsevier Interactive Patient Education  2017 [employer].

## 2016-05-06 NOTE — Progress Notes (Signed)
Cardiology Office Note   Date:  05/06/2016   ID:  Melinda [name], Melinda [name], Melinda [name]  PCP:  Melinda Frees, MD  Cardiologist:  Melinda [name] 08/2015 Melinda Ferries, PA-C   Chief Complaint  Patient presents with  . Follow-up    has been experiencing a dizzy fainting feeling    History of Present Illness: Melinda [name] is a 55 y.o. female with a history of chest pain but no sig CAD by stress test, cath or cardiac CT (2014). Hx DM, HLD, GERD, anemia, palpitations w/ +Tilt 1998  08/2015, seen by Melinda [name] for CP, ?GI origin, GXT echo ok.   Melinda [name] presents for evaluation of near-syncope.  In 1998, she had near-syncope and her Tilt was positive, she was eventually started on Prozac, also on Inderal, which helped for years.   The dizziness started again > 1 year ago. The symptoms had gotten a little worse over time, but were generally stable. A few times recently, she has felt off-balance and had to sit down to keep from falling. The sx are worse when getting out of bed in the morning. They happen in an orthostatic pattern. They have not been associated with palpitations, she does not feel her heart is out of rhythm during the episodes.   She gets occasional palpitations or chest pain, not severe, she does not feel the need to be evaluated for this.   She was on Klonopin but wished to get off this. A couple of months ago, she started tapering the Klonopin and then started the Valium, with plans to taper that as well.   She bought a BP cuff, her BP has been normal at home or a little high.   She does not check her CBGs at home, is not on a DM diet.   She does not exercise. 2 years ago, she was on a high-protein diet and that was successful. However, she has gained 25 lbs and gets DOE.    She has iron-deficiency anemia, followed by Melinda Melinda [name]. She has had iron infusions.   She has recently had some URI issues, no infectious signs, but has a cough, has been going on  x 3 weeks.   Past Medical History:  Diagnosis Date  . Diabetes mellitus type 2, controlled ([place])   . Dyslipidemia    untreated  . GERD (gastroesophageal reflux disease)   . Microcytic anemia 06/22/2012  . Nephrolith   . Palpitation    positive tilt table test. Prozac and Inderal treatment effective.    Past Surgical History:  Procedure Laterality Date  . APPENDECTOMY    . BREAST REDUCTION SURGERY    . CARDIAC CATHETERIZATION  2002   normal LV function and no significant coronary obstruction  . KIDNEY SURGERY    . PARTIAL HYSTERECTOMY    . TONSILLECTOMY      Current Outpatient Prescriptions  Medication Sig Dispense Refill  . cephALEXin (KEFLEX) 250 MG capsule Take 250 mg by mouth at bedtime.    . CRESTOR 5 MG tablet Take 5 mg by mouth every evening.   1  . diazepam (VALIUM) 5 MG tablet Take 1 tablet by mouth 2 (two) times daily.    [name] esomeprazole (NEXIUM) 40 MG capsule Take 40 mg by mouth daily at 12 noon.    [name] FLUoxetine (PROZAC) 20 MG tablet Take 20 mg by mouth daily.     . IRON PO Take 150 mg by mouth every other day.     [name]  methenamine (HIPREX) 1 g tablet Take 1 tablet by mouth daily.    . nitroGLYCERIN (NITROSTAT) 0.4 MG SL tablet Place 1 tablet (0.4 mg total) under the tongue every 5 (five) minutes as needed for chest pain. 25 tablet 3  . propranolol (INDERAL) 10 MG tablet Take 1 tablet by mouth daily.     No current facility-administered medications for this visit.     Allergies:   Amoclan [amoxicillin-pot clavulanate]; Amoxapine and related; Antihistamines, diphenhydramine-type; Ceclor [cefaclor]; Cephalexin; Ciprofloxacin; Codeine; Erythromycin; Fish oil; Flagyl [metronidazole hcl]; Floxin [ofloxacin]; Levaquin [levofloxacin in d5w]; Macrobid [nitrofurantoin monohyd macro]; Macrodantin; Other; Prednisolone; Promethazine hcl; Septra [sulfamethoxazole-trimethoprim]; Tetracyclines & related; and Vibramycin [doxycycline calcium]    Social History:  The patient  reports  that she has been smoking E-cigarettes.  She has never used smokeless tobacco. She reports that she does not drink alcohol or use drugs.   Family History:  The patient's family history includes Cancer in her father; Cancer (age of onset: 39) in her maternal grandmother; Heart attack in her father; Heart disease in her father and mother; Liver disease in her mother.    ROS:  Please see the history of present illness. All other systems are reviewed and negative.    PHYSICAL EXAM: VS:  BP 106/78 (BP Location: Right Arm, Patient Position: Sitting, Cuff Size: Normal)   Pulse 88   Ht 5\' 7"  (1.702 m)   Wt 173 lb 8 oz (78.7 kg)   BMI 27.17 kg/m  , BMI Body mass index is 27.17 kg/m. GEN: Well nourished, well developed, female in no acute distress  HEENT: normal for age  Neck: no JVD, no carotid bruit, no masses Cardiac: RRR; no murmur, no rubs, or gallops Respiratory: few rales bases w/ slight exp wheeze bilaterally, normal work of breathing GI: soft, nontender, nondistended, + BS MS: no deformity or atrophy; no edema; distal pulses are 2+ in all 4 extremities   Skin: warm and dry, no rash Neuro:  Strength and sensation are intact Psych: euthymic mood, full affect   EKG:  EKG is ordered today. The ekg ordered today demonstrates SR, No acute changes, normal intervals.  08/2015 - Stress ECG conclusions: There were no stress arrhythmias or   conduction abnormalities. The stress ECG was negative for ischemia. - Staged echo: There was no echocardiographic evidence for   stress-induced ischemia. Impressions: - Normal stress echocardiogram with no chest pain, no diagnostic ST   changes and no stress-induced wall motion abnormalities  CARDIAC CT 2014:  Left Main: No plaque or stenosis Left Anterior Descending: No plaque or stenosis in the LAD system. There was a moderate-sized 1st diagonal. Left Circumflex: No plaque or stenosis in the LCx system.  There was a small OM1, a moderate  OM2, and a moderate PLOM. Right Coronary Artery: Dominant vessel.  No plaque or stenosis. Coronary Calcium Score: 0 Agatston units Noncardiac findings read by [facility] IMPRESSION: 1. No coronary plaque or stenosis noted. 2. Coronary artery calcium score of 0 Agatston units suggests low risk of future cardiac events.   Recent Labs: 02/28/2016: BUN 18; Creatinine, Ser 0.84; Hemoglobin 10.2; Platelets 304; Potassium 4.3; Sodium 140    Lipid Panel    Component Value Date/Time   CHOL 159 08/25/2015 0747   TRIG 310 (H) 08/25/2015 0747   HDL 29 (L) 08/25/2015 0747   CHOLHDL 5.5 (H) 08/25/2015 0747   VLDL 62 (H) 08/25/2015 0747   LDLCALC 68 08/25/2015 0747     Wt Readings from Last  3 Encounters:  05/06/16 173 lb 8 oz (78.7 kg)  08/08/15 167 lb 9.6 oz (76 kg)  06/07/15 160 lb (72.6 kg)     Other studies Reviewed: Additional studies/ records that were reviewed today include: Office notes and previous testing.  ASSESSMENT AND PLAN:  1.  Near-syncope: pt has several potential contributing factors. CBGs are not being followed and could be low at times. Pt does not eat regular meals. She may be dry at times from glycosuria as she likes foods with sugars/carbs. Her psych meds are being transitioned, the changes may be giving her some side effects. Her fitness level seems poor, which may make her light-headed when she tries to exert herself. She is encouraged to begin an exercise program, low level, to increase her fitness and maintain core strength. Make sure she drinks at least 1.5 L water daily  2. DM: Pt is encouraged to begin checking CBGs and f/u with primary MD. She was given info on a DM diet.   3. Psych issues: Pt in the process of switching medications and hopes to come off everything. Advised her that might not be possible, she should work carefully with her psychiatrist to manage her issues.  4. Hx Palpitations and chest pain: Ms Reimer has not had much in the way  of palpitations or chest pain. She feels she does not need further workup on these issues at this time.  Once the DM, psych and fitness issues are stabilized, pt should f/u with Melinda [name] or sooner with me prn, to discuss further testing.  Current medicines are reviewed at length with the patient today.  The patient does not have concerns regarding medicines.  The following changes have been made:  no change  Labs/ tests ordered today include:  No orders of the defined types were placed in this encounter.    Disposition:   FU with Melinda [name]  Signed, Melinda Ferries, PA-C  05/06/2016 3:28 PM    [place] Phone: [phone]; Fax: [phone]  This note was written with the assistance of speech recognition software. Please excuse any transcriptional errors.

## 2016-06-04 ENCOUNTER — Other Ambulatory Visit: Payer: Self-pay | Admitting: Family Medicine

## 2016-06-04 ENCOUNTER — Ambulatory Visit
Admission: RE | Admit: 2016-06-04 | Discharge: 2016-06-04 | Disposition: A | Discharge status: Home / Self Care | Payer: Managed Care, Other (non HMO) | Source: Ambulatory Visit | Attending: Family Medicine | Admitting: Family Medicine

## 2016-06-04 DIAGNOSIS — J40 Bronchitis, not specified as acute or chronic: Secondary | ICD-10-CM

## 2016-06-22 ENCOUNTER — Other Ambulatory Visit (HOSPITAL_COMMUNITY): Payer: Self-pay | Admitting: Orthopedic Surgery

## 2016-06-22 ENCOUNTER — Ambulatory Visit (HOSPITAL_COMMUNITY)
Admission: RE | Admit: 2016-06-22 | Discharge: 2016-06-22 | Disposition: A | Discharge status: Home / Self Care | Payer: Managed Care, Other (non HMO) | Source: Ambulatory Visit | Attending: Family Medicine | Admitting: Family Medicine

## 2016-06-22 DIAGNOSIS — M79661 Pain in right lower leg: Secondary | ICD-10-CM

## 2016-06-22 DIAGNOSIS — M7989 Other specified soft tissue disorders: Secondary | ICD-10-CM | POA: Diagnosis not present

## 2016-06-22 NOTE — Progress Notes (Signed)
*  Preliminary Results* Right lower extremity venous duplex completed. Right lower extremity is negative for deep vein thrombosis. There is no evidence of right Baker's cyst.  06/22/2016 5:18 PM  Maudry Mayhew, BS, RVT, RDCS, RDMS

## 2016-07-02 NOTE — Addendum Note (Signed)
Addended by: Diana Eves on: 07/02/2016 08:08 AM   Modules accepted: Orders

## 2016-07-28 ENCOUNTER — Encounter: Payer: Self-pay | Admitting: Internal Medicine

## 2016-08-06 ENCOUNTER — Ambulatory Visit: Payer: Managed Care, Other (non HMO) | Admitting: Internal Medicine

## 2016-09-15 NOTE — Progress Notes (Signed)
Cardiology Office Note   Date:  09/17/2016   ID:  DORLISA SAVINO, DOB July 04, 1960, MRN [id number]  PCP:  Shirline Frees, MD  Cardiologist:   Dorris Carnes, MD   F/U of CP and dizziness      History of Present Illness: Melinda [name] is a 56 y.o. female with a history of CP. Stress test, cath and cardiac CT in past  All negative   I saw her in March 2017   In the interval she was seen by R barrett in Nov 2017 for dizziness    Dizziness better  Went off of klonipnn  Now on valium  Still anioxus    Also on prozac  Rx Dr Caryl Comes back in 1998 for palpitations/BP  ? If it is still working   Breathing is OK  No signif dizziness  nNo CP      Current Meds  Medication Sig  . CRESTOR 5 MG tablet Take 5 mg by mouth every evening.   . diazepam (VALIUM) 5 MG tablet Take 1 tablet by mouth 2 (two) times daily.  [name] esomeprazole (NEXIUM) 40 MG capsule Take 40 mg by mouth daily at 12 noon.  [name] FLUoxetine (PROZAC) 20 MG tablet Take 20 mg by mouth daily.   . IRON PO Take 150 mg by mouth every other day.   . methenamine (HIPREX) 1 g tablet Take 1 tablet by mouth daily.  . nitroGLYCERIN (NITROSTAT) 0.4 MG SL tablet Place 1 tablet (0.4 mg total) under the tongue every 5 (five) minutes as needed for chest pain.  [name] propranolol (INDERAL) 10 MG tablet Take 1 tablet by mouth daily.     Allergies:   Amoclan [amoxicillin-pot clavulanate]; Amoxapine and related; Antihistamines, diphenhydramine-type; Ceclor [cefaclor]; Cephalexin; Ciprofloxacin; Codeine; Erythromycin; Fish oil; Flagyl [metronidazole hcl]; Floxin [ofloxacin]; Levaquin [levofloxacin in d5w]; Macrobid [nitrofurantoin monohyd macro]; Macrodantin; Other; Prednisolone; Promethazine hcl; Septra [sulfamethoxazole-trimethoprim]; Tetracyclines & related; and Vibramycin [doxycycline calcium]   Past Medical History:  Diagnosis Date  . Diabetes mellitus type 2, controlled ([place])   . Dyslipidemia    untreated  . GERD (gastroesophageal reflux disease)   .  Microcytic anemia 06/22/2012  . Nephrolith   . Palpitation    positive tilt table test. Prozac and Inderal treatment effective.    Past Surgical History:  Procedure Laterality Date  . APPENDECTOMY    . BREAST REDUCTION SURGERY    . CARDIAC CATHETERIZATION  2002   normal LV function and no significant coronary obstruction  . KIDNEY SURGERY    . PARTIAL HYSTERECTOMY    . TONSILLECTOMY       Social History:  The patient  reports that she has been smoking E-cigarettes.  She has never used smokeless tobacco. She reports that she does not drink alcohol or use drugs.   Family History:  The patient's family history includes Cancer in her father; Cancer (age of onset: 78) in her maternal grandmother; Heart attack in her father; Heart disease in her father and mother; Liver disease in her mother.    ROS:  Please see the history of present illness. All other systems are reviewed and  Negative to the above problem except as noted.    PHYSICAL EXAM: VS:  BP 112/66   Pulse 84   Ht 5\' 7"  (1.702 m)   Wt 169 lb 1.9 oz (76.7 kg)   SpO2 98%   BMI 26.49 kg/m   GEN: Well nourished, well developed, in no acute distress  HEENT: normal  Neck:  no JVD, carotid bruits, or masses Cardiac: RRR; no murmurs, rubs, or gallops,no edema  Respiratory:  clear to auscultation bilaterally, normal work of breathing GI: soft, nontender, nondistended, + BS  No hepatomegaly  MS: no deformity Moving all extremities   Skin: warm and dry, no rash Neuro:  Strength and sensation are intact Psych: euthymic mood, full affect   EKG:  EKG is not ordered today.   Lipid Panel    Component Value Date/Time   CHOL 159 08/25/2015 0747   TRIG 310 (H) 08/25/2015 0747   HDL 29 (L) 08/25/2015 0747   CHOLHDL 5.5 (H) 08/25/2015 0747   VLDL 62 (H) 08/25/2015 0747   LDLCALC 68 08/25/2015 0747      Wt Readings from Last 3 Encounters:  09/17/16 169 lb 1.9 oz (76.7 kg)  05/06/16 173 lb 8 oz (78.7 kg)  08/08/15 167 lb 9.6  oz (76 kg)      ASSESSMENT AND PLAN:  1  CP  Denies    2  Palpitations  Pt symptoms controlled  Encouraged her to stay acitve HR does go up but less than 20 pts with standing  BP actually goes up with standing   COntinue meds for now  WIll review with Dr Reece Levy [phone]  F/U in Feb 2019    Current medicines are reviewed at length with the patient today.  The patient does not have concerns regarding medicines.  Signed, Dorris Carnes, MD  09/17/2016 3:22 PM    [facility] [place], [place], [place]  [address] Phone: [phone]; Fax: [phone]

## 2016-09-17 ENCOUNTER — Encounter: Payer: Self-pay | Admitting: Internal Medicine

## 2016-09-17 ENCOUNTER — Ambulatory Visit (INDEPENDENT_AMBULATORY_CARE_PROVIDER_SITE_OTHER): Payer: 59 | Admitting: Internal Medicine

## 2016-09-17 VITALS — BP 112/66 | HR 84 | Ht 67.0 in | Wt 169.1 lb

## 2016-09-17 DIAGNOSIS — R072 Precordial pain: Secondary | ICD-10-CM

## 2016-09-17 DIAGNOSIS — F419 Anxiety disorder, unspecified: Secondary | ICD-10-CM | POA: Diagnosis not present

## 2016-09-17 MED ORDER — NITROGLYCERIN 0.4 MG SL SUBL: 0.4000 mg | SUBLINGUAL_TABLET | SUBLINGUAL | 3 refills | Status: DC | PRN

## 2016-09-17 NOTE — Patient Instructions (Signed)
No changes for now.   Your physician wants you to follow-up in: Feb, 2019 with Dr. Harrington Challenger.  You will receive a reminder letter in the mail two months in advance. If you don't receive a letter, please call our office to schedule the follow-up appointment.

## 2016-12-24 ENCOUNTER — Telehealth: Payer: Self-pay | Admitting: Internal Medicine

## 2016-12-24 NOTE — Telephone Encounter (Signed)
Melinda [name] is calling because she is having some fluttering in her heart and some pain across her pain and jaw. Please call

## 2016-12-24 NOTE — Telephone Encounter (Signed)
Pt tells me that she "just plain  don't feel good". Reports pain across her back and around to her chest.  States that she has had this pain before. The pain occurs at rest, is constant pain, along w/ slight SOB.  Denies radiating pain. Pain is located "an inch below my neck", "in the middle b/t your neck and middle of chest".  She usually takes Nexium and this relieves pain -- but pain is not relieved by Nexium. She has also experienced fluttering in her chest.  This usually occurs when her blood count is low - she takes a daily iron pill. Reports blood work recently taken and stable. Reports recently going several times per week to chiropractor for back and neck adjustments.  (she has been going to chiropractor for years).  Adjustments to back and neck are not getting better, but getting worse.  She is going out of town August 11 and wants to make sure she is ok before leaving.  Pt aware I will forward to Dr. Harrington Challenger to address.

## 2016-12-24 NOTE — Telephone Encounter (Signed)
Contacted pt   She is having some back pain  Also chest discomfort  Atypical  Not associated with activity Does complain of some palpitations  No dizziness with  I would recom she stay hydrated I do not think symptoms due to coronary ischemia

## 2016-12-30 ENCOUNTER — Ambulatory Visit
Admission: RE | Admit: 2016-12-30 | Discharge: 2016-12-30 | Disposition: A | Discharge status: Home / Self Care | Payer: 59 | Source: Ambulatory Visit | Attending: Family Medicine | Admitting: Family Medicine

## 2016-12-30 ENCOUNTER — Other Ambulatory Visit: Payer: Self-pay | Admitting: Family Medicine

## 2016-12-30 DIAGNOSIS — R002 Palpitations: Secondary | ICD-10-CM

## 2016-12-30 DIAGNOSIS — M549 Dorsalgia, unspecified: Secondary | ICD-10-CM

## 2017-01-26 ENCOUNTER — Other Ambulatory Visit: Payer: Self-pay | Admitting: Gastroenterology

## 2017-01-26 DIAGNOSIS — R1084 Generalized abdominal pain: Secondary | ICD-10-CM

## 2017-02-01 ENCOUNTER — Ambulatory Visit
Admission: RE | Admit: 2017-02-01 | Discharge: 2017-02-01 | Disposition: A | Discharge status: Home / Self Care | Payer: 59 | Source: Ambulatory Visit | Attending: Gastroenterology | Admitting: Gastroenterology

## 2017-02-01 DIAGNOSIS — R1084 Generalized abdominal pain: Secondary | ICD-10-CM

## 2017-02-02 ENCOUNTER — Other Ambulatory Visit (HOSPITAL_COMMUNITY): Payer: Self-pay | Admitting: Gastroenterology

## 2017-02-02 DIAGNOSIS — K2100 Gastro-esophageal reflux disease with esophagitis, without bleeding: Secondary | ICD-10-CM

## 2017-02-02 DIAGNOSIS — R079 Chest pain, unspecified: Secondary | ICD-10-CM

## 2017-02-09 ENCOUNTER — Encounter (HOSPITAL_COMMUNITY)
Admission: RE | Admit: 2017-02-09 | Discharge: 2017-02-09 | Disposition: A | Discharge status: Home / Self Care | Payer: 59 | Source: Ambulatory Visit | Attending: Gastroenterology | Admitting: Gastroenterology

## 2017-02-09 DIAGNOSIS — R079 Chest pain, unspecified: Secondary | ICD-10-CM | POA: Diagnosis not present

## 2017-02-09 DIAGNOSIS — K2100 Gastro-esophageal reflux disease with esophagitis, without bleeding: Secondary | ICD-10-CM

## 2017-02-09 MED ORDER — TECHNETIUM TC 99M MEBROFENIN IV KIT
5.1000 | PACK | Freq: Once | INTRAVENOUS | Status: AC | PRN
  Administered 2017-02-09: 5.1 via INTRAVENOUS

## 2017-02-10 NOTE — Progress Notes (Signed)
Cardiology Office Note    Date:  02/11/2017   ID:  Melinda [name], DOB 20-Apr-1961, MRN [id number]  PCP:  Shirline Frees, MD  Cardiologist:  Dr. Harrington Challenger  Chief Complaint: Chest pain   History of Present Illness:   Melinda [name] is a 56 y.o. female with hx of Dm, HLD, palpitation with + Tilt 1998, anemia (requiring multiple iron infusion)and GERD presented for chest pain evaluation.   In 1998, she had near-syncope and her Tilt was positive, she was eventually started on Prozac, also on Inderal, which helped for years.  Prior hx of chest pain but no sig CAD by stress test, cath or cardiac CT (2014). Normal stress echo on 08/2015. She was doing relatively well on cardiac stand point when last seen by Dr. Harrington Challenger 09/17/16.  Recent unremarkable abdominal US 02/01/17 and hepatobiliary imagining 02/09/17 for epigastric pain.   Patient has not had epigastric/lower sternal chest pain for the past 2 months. Intermittent. She described her pain as a Burning/tightness. Occurs with and without exertion. GI workup is as above. She also complains of "fluttering sensation". She did get shortness of breath. Denies orthopnea, PND, syncope or melena.      Past Medical History:  Diagnosis Date  . Diabetes mellitus type 2, controlled ([place])   . Dyslipidemia    untreated  . GERD (gastroesophageal reflux disease)   . Microcytic anemia 06/22/2012  . Nephrolith   . Palpitation    positive tilt table test. Prozac and Inderal treatment effective.    Past Surgical History:  Procedure Laterality Date  . APPENDECTOMY    . BREAST REDUCTION SURGERY    . CARDIAC CATHETERIZATION  2002   normal LV function and no significant coronary obstruction  . KIDNEY SURGERY    . PARTIAL HYSTERECTOMY    . TONSILLECTOMY      Current Medications: Prior to Admission medications   Medication Sig Start Date End Date Taking? Authorizing Provider  CRESTOR 5 MG tablet Take 5 mg by mouth every evening.  08/10/14   [provider]  diazepam (VALIUM) 5 MG tablet Take 1 tablet by mouth 2 (two) times daily. 03/30/16   [provider]  esomeprazole (NEXIUM) 40 MG capsule Take 40 mg by mouth daily at 12 noon.    [provider]  FLUoxetine (PROZAC) 20 MG tablet Take 20 mg by mouth daily.     [provider]  IRON PO Take 150 mg by mouth every other day.     [provider]  methenamine (HIPREX) 1 g tablet Take 1 tablet by mouth daily. 04/19/16   [provider]  nitroGLYCERIN (NITROSTAT) 0.4 MG SL tablet Place 1 tablet (0.4 mg total) under the tongue every 5 (five) minutes as needed for chest pain. 09/17/16   Fay Records, MD  propranolol (INDERAL) 10 MG tablet Take 1 tablet by mouth daily. 03/13/16   [provider]    Allergies:   Amoclan [amoxicillin-pot clavulanate]; Amoxapine and related; Antihistamines, diphenhydramine-type; Ceclor [cefaclor]; Cephalexin; Ciprofloxacin; Codeine; Erythromycin; Fish oil; Flagyl [metronidazole hcl]; Floxin [ofloxacin]; Levaquin [levofloxacin in d5w]; Macrobid [nitrofurantoin monohyd macro]; Macrodantin; Other; Prednisolone; Promethazine hcl; Septra [sulfamethoxazole-trimethoprim]; Tetracyclines & related; and Vibramycin [doxycycline calcium]   Social History   Social History  . Marital status: Married    Spouse name: N/A  . Number of children: 1  . Years of education: N/A   Occupational History  .      not working now.   [name]  Unemployed   Social History Main Topics  . Smoking status: Current Every Day Smoker    Types: E-cigarettes    Last attempt to quit: 07/08/2010  . Smokeless tobacco: Never Used     Comment: uses electronic  . Alcohol use No  . Drug use: No  . Sexual activity: Yes   Other Topics Concern  . None   Social History Narrative  . None     Family History:  The patient's family history includes Cancer in her father; Cancer (age of onset: 18) in her maternal grandmother; Coronary artery disease  in her unknown relative; Diabetes in her unknown relative; Heart attack in her father and unknown relative; Heart disease in her father and mother; Liver disease in her mother.   ROS:   Please see the history of present illness.    ROS All other systems reviewed and are negative.   PHYSICAL EXAM:   VS:  BP 131/80   Pulse 83   Ht 5\' 7"  (1.702 m)   Wt 169 lb 9.6 oz (76.9 kg)   SpO2 95%   BMI 26.56 kg/m    GEN: Well nourished, well developed, in no acute distress  HEENT: normal  Neck: no JVD, carotid bruits, or masses Cardiac: RRR; no murmurs, rubs, or gallops,no edema  Respiratory:  clear to auscultation bilaterally, normal work of breathing GI: soft, nontender, nondistended, + BS MS: no deformity or atrophy  Skin: warm and dry, no rash Neuro:  Alert and Oriented x 3, Strength and sensation are intact Psych: euthymic mood, full affect  Wt Readings from Last 3 Encounters:  02/11/17 169 lb 9.6 oz (76.9 kg)  09/17/16 169 lb 1.9 oz (76.7 kg)  05/06/16 173 lb 8 oz (78.7 kg)      Studies/Labs Reviewed:   EKG:  EKG is ordered today.  The ekg ordered today demonstrates Normal sinus rhythm at rate of 83 bpm  Recent Labs: 02/28/2016: BUN 18; Creatinine, Ser 0.84; Hemoglobin 10.2; Platelets 304; Potassium 4.3; Sodium 140   Lipid Panel    Component Value Date/Time   CHOL 159 08/25/2015 0747   TRIG 310 (H) 08/25/2015 0747   HDL 29 (L) 08/25/2015 0747   CHOLHDL 5.5 (H) 08/25/2015 0747   VLDL 62 (H) 08/25/2015 0747   LDLCALC 68 08/25/2015 0747    Additional studies/ records that were reviewed today include:   As above   ASSESSMENT & PLAN:    1. Chest pain - Atypical. Likely GI etiology. Has negative prior workup. If no improvement will consider exercise Myoview. Discussed briefly today  2. Palpitation - Described as fluttering sensation. Questionable related to stress/anxiety. Increase propranolol to 20 mg daily.   3. HTN - stable.   Plan discussed with Dr.  Harrington Challenger.  Medication Adjustments/Labs and Tests Ordered: Current medicines are reviewed at length with the patient today.  Concerns regarding medicines are outlined above.  Medication changes, Labs and Tests ordered today are listed in the Patient Instructions below. Patient Instructions  Your physician has recommended you make the following change in your medication:  INCREASE PROPANOLOL TO 20 MG EVERY DAY   Your physician recommends that you schedule a follow-up appointment in:  [place] DR Devoria Glassing, PA  02/11/2017 12:39 PM    [address], [place], [place]  [address] Phone: [phone]; Fax: [phone]

## 2017-02-11 ENCOUNTER — Ambulatory Visit (INDEPENDENT_AMBULATORY_CARE_PROVIDER_SITE_OTHER): Payer: 59 | Admitting: Physician Assistant

## 2017-02-11 ENCOUNTER — Encounter: Payer: Self-pay | Admitting: Physician Assistant

## 2017-02-11 VITALS — BP 131/80 | HR 83 | Ht 67.0 in | Wt 169.6 lb

## 2017-02-11 DIAGNOSIS — R0789 Other chest pain: Secondary | ICD-10-CM

## 2017-02-11 DIAGNOSIS — I10 Essential (primary) hypertension: Secondary | ICD-10-CM | POA: Diagnosis not present

## 2017-02-11 DIAGNOSIS — R002 Palpitations: Secondary | ICD-10-CM

## 2017-02-11 MED ORDER — PROPRANOLOL HCL 20 MG PO TABS: 20.0000 mg | ORAL_TABLET | Freq: Every day | ORAL | 3 refills | Status: DC

## 2017-02-11 NOTE — Patient Instructions (Signed)
Your physician has recommended you make the following change in your medication:  INCREASE PROPANOLOL TO 20 MG EVERY DAY   Your physician recommends that you schedule a follow-up appointment in:  [place]

## 2017-03-01 ENCOUNTER — Ambulatory Visit: Payer: 59 | Admitting: Physician Assistant

## 2017-03-09 NOTE — Progress Notes (Signed)
Cardiology Office Note   Date:  03/10/2017   ID:  SAREE KROGH, DOB June 24, 1960, MRN [id number]  PCP:  Shirline Frees, MD  Cardiologist:   Dorris Carnes, MD   F/U of Chest tightness     History of Present Illness: Melinda [name] is a 56 y.o. female with a history of CP. Stress test, cath and cardiac CT in past  All negative    I saw her back in April 2018  Denied CP at time  Palpitations controlled    Pt saw B Bhagat in September  Had epigastric discomfort Also had incrased palpitations   With and without activity  USN abdomen negative  Emptying study neg  Colon neg  Followed by Dr Oletta Lamas    Recom increased propranolol   Since seen she remains symptomatic  Chest discmfort like hasto belch  Daily  Not associated with activity  No wheezing  COntinues to vape. Can last hours Does have some SOB with activity    Brother with severe CAD  Currently in hospital   Current Meds  Medication Sig  . clonazePAM (KLONOPIN) 0.5 MG tablet Take 0.5 mg by mouth 2 (two) times daily.  . CRESTOR 5 MG tablet Take 5 mg by mouth every evening.   [name] esomeprazole (NEXIUM) 40 MG capsule Take 40 mg by mouth daily at 12 noon.  [name] FLUoxetine (PROZAC) 20 MG tablet Take 20 mg by mouth daily.   . methenamine (HIPREX) 1 g tablet Take 1 tablet by mouth daily.  . nitroGLYCERIN (NITROSTAT) 0.4 MG SL tablet Place 1 tablet (0.4 mg total) under the tongue every 5 (five) minutes as needed for chest pain.  [name] NU-IRON 150 MG capsule Take 150 mg by mouth 2 (two) times daily.  . propranolol (INDERAL) 20 MG tablet Take 1 tablet (20 mg total) by mouth daily.     Allergies:   Amoclan [amoxicillin-pot clavulanate]; Amoxapine and related; Antihistamines, diphenhydramine-type; Ceclor [cefaclor]; Cephalexin; Ciprofloxacin; Codeine; Erythromycin; Fish oil; Flagyl [metronidazole hcl]; Floxin [ofloxacin]; Levaquin [levofloxacin in d5w]; Macrobid [nitrofurantoin monohyd macro]; Macrodantin; Other; Prednisolone; Promethazine hcl; Septra  [sulfamethoxazole-trimethoprim]; Tetracyclines & related; and Vibramycin [doxycycline calcium]   Past Medical History:  Diagnosis Date  . Diabetes mellitus type 2, controlled ([place])   . Dyslipidemia    untreated  . GERD (gastroesophageal reflux disease)   . Microcytic anemia 06/22/2012  . Nephrolith   . Palpitation    positive tilt table test. Prozac and Inderal treatment effective.    Past Surgical History:  Procedure Laterality Date  . APPENDECTOMY    . BREAST REDUCTION SURGERY    . CARDIAC CATHETERIZATION  2002   normal LV function and no significant coronary obstruction  . KIDNEY SURGERY    . PARTIAL HYSTERECTOMY    . TONSILLECTOMY       Social History:  The patient  reports that she has been smoking E-cigarettes.  She has never used smokeless tobacco. She reports that she does not drink alcohol or use drugs.   Family History:  The patient's family history includes Cancer in her father; Cancer (age of onset: 4) in her maternal grandmother; Coronary artery disease in her unknown relative; Diabetes in her unknown relative; Heart attack in her father and unknown relative; Heart disease in her father and mother; Liver disease in her mother.    ROS:  Please see the history of present illness. All other systems are reviewed and  Negative to the above problem except as noted.    PHYSICAL  EXAM: VS:  BP 118/68   Pulse 77   Ht 5\' 7"  (1.702 m)   Wt 172 lb 6.4 oz (78.2 kg)   SpO2 97%   BMI 27.00 kg/m   GEN: Well nourished, well developed, in no acute distress  HEENT: normal  Neck: no JVD, carotid bruits, or masses Cardiac: RRR; no murmurs, rubs, or gallops,no edema  Respiratory:  clear to auscultation bilaterally, normal work of breathing  Airflow does appear sl down   GI: soft, nontender, nondistended, + BS  No hepatomegaly  MS: no deformity Moving all extremities   Skin: warm and dry, no rash Neuro:  Strength and sensation are intact Psych: euthymic mood, full  affect   EKG:  EKG is not ordered today.   Lipid Panel    Component Value Date/Time   CHOL 159 08/25/2015 0747   TRIG 310 (H) 08/25/2015 0747   HDL 29 (L) 08/25/2015 0747   CHOLHDL 5.5 (H) 08/25/2015 0747   VLDL 62 (H) 08/25/2015 0747   LDLCALC 68 08/25/2015 0747      Wt Readings from Last 3 Encounters:  03/10/17 172 lb 6.4 oz (78.2 kg)  02/11/17 169 lb 9.6 oz (76.9 kg)  09/17/16 169 lb 1.9 oz (76.7 kg)      ASSESSMENT AND PLAN:  1  CP  I am not sure what is causing chest discomfort  I am not concinved cardiac  Very atypcial  She has had multiple evals  Note she is under increased stress I would recomm baseline PFTs  Futher testing based on results    2  Palpitations  COntrolled  Keep on inderal   F/U in Feb 2019    Current medicines are reviewed at length with the patient today.  The patient does not have concerns regarding medicines.  Signed, Dorris Carnes, MD  03/10/2017 12:40 PM    [address], [place], [place]  [address] Phone: [phone]; Fax: [phone]

## 2017-03-10 ENCOUNTER — Encounter: Payer: Self-pay | Admitting: Internal Medicine

## 2017-03-10 ENCOUNTER — Ambulatory Visit (INDEPENDENT_AMBULATORY_CARE_PROVIDER_SITE_OTHER): Payer: 59 | Admitting: Internal Medicine

## 2017-03-10 VITALS — BP 118/68 | HR 77 | Ht 67.0 in | Wt 172.4 lb

## 2017-03-10 DIAGNOSIS — R002 Palpitations: Secondary | ICD-10-CM

## 2017-03-10 DIAGNOSIS — R0602 Shortness of breath: Secondary | ICD-10-CM | POA: Diagnosis not present

## 2017-03-10 DIAGNOSIS — R0789 Other chest pain: Secondary | ICD-10-CM

## 2017-03-10 NOTE — Patient Instructions (Signed)
Your physician recommends that you continue on your current medications as directed. Please refer to the Current Medication list given to you today. Your physician has recommended that you have a pulmonary function test. Pulmonary Function Tests are a group of tests that measure how well air moves in and out of your lungs.

## 2017-03-15 ENCOUNTER — Encounter: Payer: 59 | Admitting: *Deleted

## 2017-03-15 DIAGNOSIS — R0602 Shortness of breath: Secondary | ICD-10-CM

## 2017-03-15 DIAGNOSIS — R0789 Other chest pain: Secondary | ICD-10-CM

## 2017-03-15 LAB — PULMONARY FUNCTION TEST
DL/VA % PRED: 89 %
DL/VA: 4.52 ml/min/mmHg/L
DLCO COR % PRED: 91 %
DLCO COR: 24.67 ml/min/mmHg
DLCO UNC % PRED: 92 %
DLCO unc: 24.82 ml/min/mmHg
FEF 25-75 Pre: 1.49 L/sec
FEF2575-%PRED-PRE: 58 %
FEV1-%Pred-Pre: 86 %
FEV1-Pre: 2.42 L
FEV1FVC-%PRED-PRE: 81 %
FEV6-%PRED-PRE: 106 %
FEV6-Pre: 3.72 L
FEV6FVC-%Pred-Pre: 100 %
FVC-%PRED-PRE: 105 %
FVC-Pre: 3.8 L
PRE FEV6/FVC RATIO: 98 %
Pre FEV1/FVC ratio: 64 %

## 2017-03-17 ENCOUNTER — Telehealth: Payer: Self-pay | Admitting: Internal Medicine

## 2017-03-17 ENCOUNTER — Other Ambulatory Visit: Payer: Self-pay | Admitting: Gastroenterology

## 2017-03-17 DIAGNOSIS — R079 Chest pain, unspecified: Secondary | ICD-10-CM

## 2017-03-17 NOTE — Telephone Encounter (Signed)
New message    Pt is calling asking for a call back.   Pt c/o of Chest Pain: STAT if CP now or developed within 24 hours  1. Are you having CP right now? Pt states she has a lot of pressure.  2. Are you experiencing any other symptoms (ex. SOB, nausea, vomiting, sweating)? No pt states she gets really hot.  3. How long have you been experiencing CP? Several weeks  4. Is your CP continuous or coming and going? Coming and going  5. Have you taken Nitroglycerin? No  ?

## 2017-03-17 NOTE — Telephone Encounter (Signed)
Spoke with patient who is highly concerned about her chest pressure/pain.  She reports a recent nl PFT, multiple GI tests by Dr Oletta Lamas that are "not showing anything" and a very strong family history with early CAD.  She is very tearful and states she is tired of hurting and just wants some answers.  Advised of Dr Harrington Challenger' order for a GXT.  Pt does not feel like this is the next best test for her.  Pt is requesting if possible to move directly cardiac cath. Advised I will forward this information to Dr Harrington Challenger for review and will c/b once we have an answer for her.

## 2017-03-18 NOTE — Telephone Encounter (Signed)
Spoke to pt   Schedule for Stress Echo

## 2017-04-04 ENCOUNTER — Encounter (HOSPITAL_COMMUNITY)
Admission: RE | Disposition: A | Discharge status: Home / Self Care | Payer: Self-pay | Source: Ambulatory Visit | Attending: Gastroenterology

## 2017-04-04 ENCOUNTER — Ambulatory Visit (HOSPITAL_COMMUNITY)
Admission: RE | Admit: 2017-04-04 | Discharge: 2017-04-04 | Disposition: A | Discharge status: Home / Self Care | Payer: 59 | Source: Ambulatory Visit | Attending: Gastroenterology | Admitting: Gastroenterology

## 2017-04-04 DIAGNOSIS — K449 Diaphragmatic hernia without obstruction or gangrene: Secondary | ICD-10-CM | POA: Insufficient documentation

## 2017-04-04 DIAGNOSIS — R0789 Other chest pain: Secondary | ICD-10-CM | POA: Insufficient documentation

## 2017-04-04 HISTORY — PX: ESOPHAGEAL MANOMETRY: SHX5429

## 2017-04-04 SURGERY — MANOMETRY, ESOPHAGUS

## 2017-04-04 MED ORDER — LIDOCAINE VISCOUS 2 % MT SOLN
OROMUCOSAL | Status: AC
  Filled 2017-04-04: qty 15

## 2017-04-04 SURGICAL SUPPLY — 2 items
FACESHIELD LNG OPTICON STERILE (SAFETY) IMPLANT
GLOVE BIO SURGEON STRL SZ8 (GLOVE) ×6 IMPLANT

## 2017-04-04 NOTE — Progress Notes (Signed)
Esophageal Manometry done per protocol. Pt tolerated well without distress or complication . Report to be read by Dr. Michail Sermon.

## 2017-04-05 ENCOUNTER — Encounter (HOSPITAL_COMMUNITY): Payer: Self-pay | Admitting: Gastroenterology

## 2017-04-12 ENCOUNTER — Telehealth (HOSPITAL_COMMUNITY): Payer: Self-pay | Admitting: *Deleted

## 2017-04-12 NOTE — Telephone Encounter (Signed)
Patient given detailed instructions per Stress Test Requisition Sheet for test on 04/19/17 at 2:30.Patient Notified to arrive 30 minutes early, and that it is imperative to arrive on time for appointment to keep from having the test rescheduled.  Patient verbalized understanding. Melinda [name]

## 2017-04-13 ENCOUNTER — Telehealth: Payer: Self-pay | Admitting: Internal Medicine

## 2017-04-13 NOTE — Telephone Encounter (Signed)
Ok. Encounter closed. I had to change the resource so I changed it to [employer]

## 2017-04-19 ENCOUNTER — Other Ambulatory Visit: Payer: Self-pay

## 2017-04-19 ENCOUNTER — Ambulatory Visit (HOSPITAL_COMMUNITY): Payer: 59 | Attending: Cardiology

## 2017-04-19 ENCOUNTER — Ambulatory Visit (HOSPITAL_COMMUNITY): Payer: 59

## 2017-04-19 DIAGNOSIS — R079 Chest pain, unspecified: Secondary | ICD-10-CM | POA: Diagnosis not present

## 2017-05-05 ENCOUNTER — Telehealth: Payer: Self-pay | Admitting: Internal Medicine

## 2017-05-05 DIAGNOSIS — I498 Other specified cardiac arrhythmias: Secondary | ICD-10-CM

## 2017-05-05 NOTE — Telephone Encounter (Signed)
This week patient noticing from around mid day on stronger, more frequent sensation in chest.  Not just fluttering.   Feels l"ike its out of rhythm, like off, not quite right".  Feels in her ears and her back as well as chest.   Little SOB since yesterday afternoon.  Same whether resting or with activity.   HR on her cuff are 85-95.  BPs 135-145. Both little higher than her normal.  Saw chiropractor today who adv to call cardiology.   She is ordered inderal 20 mg daily but splitting- taking 10 BID.   Advised to take other half of today's dose now, and then starting tomorrow take the whole tablet at once.    Asked her to call back Monday with update.   Adv if Dr. Harrington Challenger has further suggestions prior to Monday, we will call her tomorrow.

## 2017-05-05 NOTE — Telephone Encounter (Signed)
New message   Patient calling   STAT if HR is under 50 or over 120 (normal HR is 60-100 beats per minute)  1) What is your heart rate? Yesterday 86 to  96; today - same   2) Do you have a log of your heart rate readings (document readings)? No   3) Do you have any other symptoms? Feels like heart fluttering - off beat feeling

## 2017-05-08 NOTE — Telephone Encounter (Signed)
Set up for event monitor Recent stress echo normal

## 2017-05-09 NOTE — Telephone Encounter (Signed)
Called patient.  She reports not really feeling any better.  She is aware someone will call her to schedule appointment for event monitor.

## 2017-05-13 ENCOUNTER — Other Ambulatory Visit: Payer: Self-pay | Admitting: Internal Medicine

## 2017-05-13 DIAGNOSIS — R002 Palpitations: Secondary | ICD-10-CM

## 2017-05-13 DIAGNOSIS — I498 Other specified cardiac arrhythmias: Secondary | ICD-10-CM

## 2017-05-20 ENCOUNTER — Ambulatory Visit (INDEPENDENT_AMBULATORY_CARE_PROVIDER_SITE_OTHER): Payer: 59

## 2017-05-20 DIAGNOSIS — I498 Other specified cardiac arrhythmias: Secondary | ICD-10-CM | POA: Diagnosis not present

## 2017-05-20 DIAGNOSIS — R002 Palpitations: Secondary | ICD-10-CM | POA: Diagnosis not present

## 2017-07-05 ENCOUNTER — Other Ambulatory Visit: Payer: Self-pay | Admitting: *Deleted

## 2017-07-05 MED ORDER — PROPRANOLOL HCL 20 MG PO TABS: 20.0000 mg | ORAL_TABLET | Freq: Two times a day (BID) | ORAL | 6 refills | Status: DC

## 2017-07-28 ENCOUNTER — Encounter: Payer: Self-pay | Admitting: Internal Medicine

## 2017-08-04 ENCOUNTER — Encounter: Payer: Self-pay | Admitting: Internal Medicine

## 2017-08-04 ENCOUNTER — Ambulatory Visit (INDEPENDENT_AMBULATORY_CARE_PROVIDER_SITE_OTHER): Payer: 59 | Admitting: Internal Medicine

## 2017-08-04 VITALS — BP 122/68 | HR 71 | Ht 67.0 in | Wt 175.8 lb

## 2017-08-04 DIAGNOSIS — R0789 Other chest pain: Secondary | ICD-10-CM

## 2017-08-04 DIAGNOSIS — R002 Palpitations: Secondary | ICD-10-CM

## 2017-08-04 MED ORDER — DILTIAZEM HCL 30 MG PO TABS: 30.0000 mg | ORAL_TABLET | Freq: Two times a day (BID) | ORAL | 6 refills | Status: DC

## 2017-08-04 NOTE — Patient Instructions (Signed)
Your physician has recommended you make the following change in your medication:  1.) taper off inderal 2.) start diltiazem 30 mg once a day then up to two times daily

## 2017-08-04 NOTE — Progress Notes (Signed)
Cardiology Office Note   Date:  08/04/2017   ID:  [place], Hiltunen [birth year], MRN [id number]  PCP:  Crist Infante, MD  Cardiologist:   Dorris Carnes, MD   F/U of Chest tightness     History of Present Illness: Melinda [name] is a 57 y.o. female with a history of CP. Stress test, cath and cardiac CT in past  All negative     Pt saw B Bhagat in September  Had epigastric discomfort Also had incrased palpitations   With and without activity  USN abdomen negative  Emptying study neg  Colon neg  Followed by Dr Oletta Lamas    Recom increased propranolol    I saw her back in October Recommended PFTs    Since seen  Breathing is OK  Still with some palpitations   Some CP  Not asociated with activity  Palpitaatoins not associated with dizziness  [place] in Jan showed occasional skip in upper chambers   REcom trial of inderal No change      Current Meds  Medication Sig  . clonazePAM (KLONOPIN) 0.5 MG tablet Take 0.5 mg by mouth 2 (two) times daily.  . CRESTOR 5 MG tablet Take 5 mg by mouth every evening.   [name] esomeprazole (NEXIUM) 40 MG capsule Take 40 mg by mouth daily at 12 noon.  [name] FLUoxetine (PROZAC) 20 MG tablet Take 20 mg by mouth daily.   . methenamine (HIPREX) 1 g tablet Take 1 tablet by mouth daily.  . nitroGLYCERIN (NITROSTAT) 0.4 MG SL tablet Place 1 tablet (0.4 mg total) under the tongue every 5 (five) minutes as needed for chest pain.  [name] NU-IRON 150 MG capsule Take 150 mg by mouth 2 (two) times daily.  . propranolol (INDERAL) 20 MG tablet Take 1 tablet (20 mg total) by mouth 2 (two) times daily.     Allergies:   Amoclan [amoxicillin-pot clavulanate]; Amoxapine and related; Antihistamines, diphenhydramine-type; Ceclor [cefaclor]; Cephalexin; Ciprofloxacin; Codeine; Erythromycin; Fish oil; Flagyl [metronidazole hcl]; Floxin [ofloxacin]; Levaquin [levofloxacin in d5w]; Macrobid [nitrofurantoin monohyd macro]; Macrodantin; Other; Prednisolone; Promethazine hcl; Septra  [sulfamethoxazole-trimethoprim]; Tetracyclines & related; and Vibramycin [doxycycline calcium]   Past Medical History:  Diagnosis Date  . Diabetes mellitus type 2, controlled ([place])   . Dyslipidemia    untreated  . GERD (gastroesophageal reflux disease)   . Microcytic anemia 06/22/2012  . Nephrolith   . Palpitation    positive tilt table test. Prozac and Inderal treatment effective.    Past Surgical History:  Procedure Laterality Date  . APPENDECTOMY    . BREAST REDUCTION SURGERY    . CARDIAC CATHETERIZATION  2002   normal LV function and no significant coronary obstruction  . ESOPHAGEAL MANOMETRY N/A 04/04/2017   Procedure: ESOPHAGEAL MANOMETRY (EM);  Surgeon: Laurence Spates, MD;  Location: [facility];  Service: Endoscopy;  Laterality: N/A;  . KIDNEY SURGERY    . PARTIAL HYSTERECTOMY    . TONSILLECTOMY       Social History:  The patient  reports that she has been smoking e-cigarettes.  she has never used smokeless tobacco. She reports that she does not drink alcohol or use drugs.   Family History:  The patient's family history includes Cancer in her father; Cancer (age of onset: 72) in her maternal grandmother; Coronary artery disease in her unknown relative; Diabetes in her unknown relative; Heart attack in her father and unknown relative; Heart disease in her father and mother; Liver disease in her mother.    ROS:  Please see the history of present illness. All other systems are reviewed and  Negative to the above problem except as noted.    PHYSICAL EXAM: VS:  BP 122/68   Pulse 71   Ht 5\' 7"  (1.702 m)   Wt 175 lb 12.8 oz (79.7 kg)   SpO2 96%   BMI 27.53 kg/m   GEN: Well nourished, well developed, in no acute distress  HEENT: normal  Neck: JVP normal  NO carotid bruits, or masses Cardiac: RRR; no murmurs, rubs, or gallops,no edema  Respiratory:  clear to auscultation bilaterally, normal work of breathing  Airflow does appear sl down   GI: soft, nontender,  nondistended, + BS  No hepatomegaly  MS: no deformity Moving all extremities   Skin: warm and dry, no rash Neuro:  Strength and sensation are intact Psych: euthymic mood, full affect   EKG:  EKG is not ordered today.   Lipid Panel    Component Value Date/Time   CHOL 159 08/25/2015 0747   TRIG 310 (H) 08/25/2015 0747   HDL 29 (L) 08/25/2015 0747   CHOLHDL 5.5 (H) 08/25/2015 0747   VLDL 62 (H) 08/25/2015 0747   LDLCALC 68 08/25/2015 0747      Wt Readings from Last 3 Encounters:  08/04/17 175 lb 12.8 oz (79.7 kg)  03/10/17 172 lb 6.4 oz (78.2 kg)  02/11/17 169 lb 9.6 oz (76.9 kg)      ASSESSMENT AND PLAN:  1  CP  Atypical for ischemia  Neg w/u in past    2  Palpitations  Still has    I would recomm tapering inderal   Start diltiazem 30 qd then BID   Follow response  Current medicines are reviewed at length with the patient today.  The patient does not have concerns regarding medicines.  Signed, Dorris Carnes, MD  08/04/2017 4:34 PM    [address], [place], [place]  [address] Phone: [phone]; Fax: [phone]

## 2017-08-12 ENCOUNTER — Telehealth: Payer: Self-pay | Admitting: Internal Medicine

## 2017-08-12 NOTE — Telephone Encounter (Signed)
Pt calling [phone] regarding medication Diltiazem , side effects headaches, anxious feeling , stopped taking-pls advise

## 2017-08-12 NOTE — Telephone Encounter (Signed)
At last ov patient was to taper off inderal and start diltiazem 30 twice a day. Shaking inside/out, worst headaches. Didn't take today. Took 10 mg inderal instead. Wants to go back to inderal. She was taking 20 mg in AM and 10 mg in PM but felt like would pass out laying in bed reading after nighttime dose.   She would skip the evening dose sometimes and at those times.felt like her heart was wiggling in her chest.   I advised her to take 10 mg twice a day for now of inderal.  She is aware that I will forward this information to Dr. Harrington Challenger and will call her back with further recommendations.

## 2017-08-14 NOTE — Telephone Encounter (Signed)
I would not be afraid to take diltiazem but if pt refuses I would take  inderal 10 mg 2x per day

## 2017-08-17 MED ORDER — PROPRANOLOL HCL 10 MG PO TABS: 10.0000 mg | ORAL_TABLET | Freq: Two times a day (BID) | ORAL | 3 refills | Status: DC

## 2017-08-17 NOTE — Telephone Encounter (Signed)
Called patient back. She has not tried to go back to diltiazem.  States it gave her horrible headaches and shakiness all over.  Took for about 1 week. She is taking inderal 10 mg BID so I sent a new prescription to reflect this. Still notices palpitations, and "wiggling", in chest and neck, mostly at nighttime while resting.  If these sensations are not harmful to her--pt prefers to continue inderal and not try any different medicine at this time.

## 2017-12-21 ENCOUNTER — Telehealth: Payer: Self-pay | Admitting: Internal Medicine

## 2017-12-21 NOTE — Telephone Encounter (Signed)
Will route to PharmD to see if any contraindications with Inderal and Clorazepate?

## 2017-12-21 NOTE — Telephone Encounter (Signed)
New Message    Pt c/o medication issue:  1. Name of Medication: Inderal   2. How are you currently taking this medication (dosage and times per day)?   3. Are you having a reaction (difficulty breathing--STAT)?   4. What is your medication issue? Patient was given a new medication called clorazepate to take 3 times a day. She states that she can only get to two times a day then she has issues. She is wondering if its interacting with the Inderal. Please call to dsicuss.

## 2017-12-22 NOTE — Telephone Encounter (Signed)
Was on Klonopoin for 20 years and felt it wasn't working anymore. Prescribed clorazepate 7.5 mg TID Too tired/ sedating so cut herself back to 1/2 tablet BID  Problems with rage, irritability on lower dose. Psychiatrist recommended go back up. Again only last few days then cut herself back to 7.5 mg BID Now - with low BP today 90/56 Wasn't completely passing out but had to sit down.  Dr. Reddy's office informed and pt was instructed to contact cardiologist. I reviewed input from PharmD.with patient. Encouraged her to increase fluid intake. Continues propranolol 10 mg BID  I advised pt to call back to Dr. Reece Levy to review medication/symptoms and that I will route to Dr. Harrington Challenger to inform as well. Asked pt to call back with update.  Has f/u with Dr. Reece Levy on 01/05/18.

## 2017-12-22 NOTE — Telephone Encounter (Signed)
Left message for patient to call back  

## 2017-12-22 NOTE — Telephone Encounter (Signed)
There is a mild interaction between the two - her propranolol may increase the serum concentration of clorazepate. This is not usually clinically significant, however. Would make sure she is not still taking her clonazepam as well as the clorazepate. Would also inquire as to what her "issues" are after her second dose of clorazepate. Would advise her to let her prescribing MD know that she is having trouble taking the 3rd dose of her benzo although anticipate that this should not be a big issue if she is taking less benzo than prescribed.

## 2017-12-22 NOTE — Telephone Encounter (Signed)
Will review when I see her in clinic

## 2017-12-22 NOTE — Telephone Encounter (Signed)
There is a theoretical interaction that inderal will increase clorazepate and active metabolite levels. The recommendation for the interaction is listed as category B and should not require a change in therapy.

## 2017-12-22 NOTE — Telephone Encounter (Signed)
Follow Up:; ° ° °Returning your call. °

## 2018-02-02 ENCOUNTER — Other Ambulatory Visit: Payer: Self-pay | Admitting: Physician Assistant

## 2018-02-03 NOTE — Telephone Encounter (Signed)
Order Providers   Prescribing Provider Encounter Provider  Fay Records, MD Fay Records, MD  Outpatient Medication Detail    Disp Refills Start End   propranolol (INDERAL) 10 MG tablet 180 tablet 3 08/17/2017    Sig - Route: Take 1 tablet (10 mg total) by mouth 2 (two) times daily. - Oral   Sent to pharmacy as: propranolol (INDERAL) 10 MG tablet   E-Prescribing Status: Receipt confirmed by pharmacy (08/17/2017 9:55 AM EDT)   Pharmacy   [id number] - [place], [place] - [address]

## 2018-05-15 ENCOUNTER — Ambulatory Visit (INDEPENDENT_AMBULATORY_CARE_PROVIDER_SITE_OTHER): Payer: 59 | Admitting: Internal Medicine

## 2018-05-15 ENCOUNTER — Encounter: Payer: Self-pay | Admitting: Internal Medicine

## 2018-05-15 VITALS — BP 114/60 | HR 79 | Ht 67.0 in | Wt 176.4 lb

## 2018-05-15 DIAGNOSIS — E785 Hyperlipidemia, unspecified: Secondary | ICD-10-CM

## 2018-05-15 DIAGNOSIS — R072 Precordial pain: Secondary | ICD-10-CM | POA: Diagnosis not present

## 2018-05-15 DIAGNOSIS — R002 Palpitations: Secondary | ICD-10-CM

## 2018-05-15 NOTE — Progress Notes (Addendum)
Cardiology Office Note   Date:  05/15/2018   ID:  Melinda [name], DOB June 02, 1961, MRN [id number]  PCP:  Crist Infante, MD  Cardiologist:   Dorris Carnes, MD   F/U of Chest tightness     History of Present Illness: Melinda [name] is a 57 y.o. female with a history of CP. Stress test, cath and cardiac CT in past  All negative     Pt saw B Bhagat in September 2018   Had epigastric discomfort Also had increased palpitations   With and without activity  USN abdomen negative  Emptying study neg  Colon neg  Followed by Dr Oletta Lamas    Recom increased propranolol    I saw her back in October 2018 Recommended PFTs    I saw her back in Feb 2019     She denies CP   Breathing is OK    Admits to not keeping regular with diet   Current Meds  Medication Sig  . clorazepate (TRANXENE) 7.5 MG tablet Take 7.5 mg by mouth 2 (two) times daily.  . CRESTOR 5 MG tablet Take 5 mg by mouth every evening.   [name] esomeprazole (NEXIUM) 40 MG capsule Take 40 mg by mouth daily at 12 noon.  [name] FLUoxetine (PROZAC) 20 MG tablet Take 20 mg by mouth daily.   . methenamine (HIPREX) 1 g tablet Take 0.5 g by mouth as directed.   . nitroGLYCERIN (NITROSTAT) 0.4 MG SL tablet Place 1 tablet (0.4 mg total) under the tongue every 5 (five) minutes as needed for chest pain.  [name] NU-IRON 150 MG capsule Take 150 mg by mouth 2 (two) times daily.  . propranolol (INDERAL) 10 MG tablet Take 1 tablet (10 mg total) by mouth 2 (two) times daily.     Allergies:   Amoclan [amoxicillin-pot clavulanate]; Amoxapine and related; Antihistamines, diphenhydramine-type; Ceclor [cefaclor]; Cephalexin; Ciprofloxacin; Codeine; Erythromycin; Fish oil; Flagyl [metronidazole hcl]; Floxin [ofloxacin]; Levaquin [levofloxacin in d5w]; Macrobid [nitrofurantoin monohyd macro]; Macrodantin; Other; Prednisolone; Promethazine hcl; Septra [sulfamethoxazole-trimethoprim]; Tetracyclines & related; and Vibramycin [doxycycline calcium]   Past Medical History:    Diagnosis Date  . Diabetes mellitus type 2, controlled ([place])   . Dyslipidemia    untreated  . GERD (gastroesophageal reflux disease)   . Microcytic anemia 06/22/2012  . Nephrolith   . Palpitation    positive tilt table test. Prozac and Inderal treatment effective.    Past Surgical History:  Procedure Laterality Date  . APPENDECTOMY    . BREAST REDUCTION SURGERY    . CARDIAC CATHETERIZATION  2002   normal LV function and no significant coronary obstruction  . ESOPHAGEAL MANOMETRY N/A 04/04/2017   Procedure: ESOPHAGEAL MANOMETRY (EM);  Surgeon: Laurence Spates, MD;  Location: [facility];  Service: Endoscopy;  Laterality: N/A;  . KIDNEY SURGERY    . PARTIAL HYSTERECTOMY    . TONSILLECTOMY       Social History:  The patient  reports that she has been smoking e-cigarettes. She has never used smokeless tobacco. She reports that she does not drink alcohol or use drugs.   Family History:  The patient's family history includes Cancer in her father; Cancer (age of onset: 25) in her maternal grandmother; Coronary artery disease in her unknown relative; Diabetes in her unknown relative; Heart attack in her father and unknown relative; Heart disease in her father and mother; Liver disease in her mother.    ROS:  Please see the history of present illness. All other systems are reviewed  and  Negative to the above problem except as noted.    PHYSICAL EXAM: VS:  BP 114/60   Pulse 79   Ht 5\' 7"  (1.702 m)   Wt 176 lb 6.4 oz (80 kg)   SpO2 98%   BMI 27.63 kg/m   GEN: Well nourished, well developed, in no acute distress  HEENT: normal  Neck: JVP is not elevated    NO carotid bruits, or masses Cardiac: RRR; no murmurs, rubs, or gallops,no edema  Respiratory:  clear to auscultation bilaterally, normal work of breathing  Airflow does appear sl down   GI: soft, nontender, nondistended, + BS  No hepatomegaly  MS: no deformity Moving all extremities   Skin: warm and dry, no rash Neuro:   Strength and sensation are intact Psych: euthymic mood, full affect   EKG:  EKG is ordered today.  AE 70 bpm     Lipid Panel    Component Value Date/Time   CHOL 159 08/25/2015 0747   TRIG 310 (H) 08/25/2015 0747   HDL 29 (L) 08/25/2015 0747   CHOLHDL 5.5 (H) 08/25/2015 0747   VLDL 62 (H) 08/25/2015 0747   LDLCALC 68 08/25/2015 0747      Wt Readings from Last 3 Encounters:  05/15/18 176 lb 6.4 oz (80 kg)  08/04/17 175 lb 12.8 oz (79.7 kg)  03/10/17 172 lb 6.4 oz (78.2 kg)      ASSESSMENT AND PLAN:  1  CP  Pt denies CP    2  Palpitations   Denies    3   Lipids   Will get fasting  4   Endocrine  Will check Hgb A1C    Current medicines are reviewed at length with the patient today.  The patient does not have concerns regarding medicines.  Signed, Dorris Carnes, MD  05/15/2018 12:20 PM    [facility] [place], [place], [place]  [address] Phone: [phone]; Fax: [phone]

## 2018-05-15 NOTE — Patient Instructions (Signed)
Medication Instructions:  No change If you need a refill on your cardiac medications before your next appointment, please call your pharmacy.   Lab work: Today: lipids/hgA1c If you have labs (blood work) drawn today and your tests are completely normal, you will receive your results only by: Melinda [name] MyChart Message (if you have MyChart) OR . A paper copy in the mail If you have any lab test that is abnormal or we need to change your treatment, we will call you to review the results.  Testing/Procedures: none  Follow-Up: At [facility], you and your health needs are our priority.  As part of our continuing mission to provide you with exceptional heart care, we have created designated Provider Care Teams.  These Care Teams include your primary Cardiologist (physician) and Advanced Practice Providers (APPs -  Physician Assistants and Nurse Practitioners) who all work together to provide you with the care you need, when you need it. You will need a follow up appointment in:  12 months.  Please call our office 2 months in advance to schedule this appointment.  You may see Dr. Harrington Challenger or one of the following Advanced Practice Providers on your designated Care Team: Richardson Dopp, PA-C [place], [place] . Daune Perch, NP  Any Other Special Instructions Will Be Listed Below (If Applicable).

## 2018-05-16 LAB — LIPID PANEL
CHOL/HDL RATIO: 4.8 ratio — AB (ref 0.0–4.4)
Cholesterol, Total: 152 mg/dL (ref 100–199)
HDL: 32 mg/dL — AB (ref >39)
LDL Calculated: 61 mg/dL (ref 0–99)
Triglycerides: 295 mg/dL — ABNORMAL HIGH (ref 0–149)
VLDL Cholesterol Cal: 59 mg/dL — ABNORMAL HIGH (ref 5–40)

## 2018-05-16 LAB — HEMOGLOBIN A1C
Est. average glucose Bld gHb Est-mCnc: 177 mg/dL
Hgb A1c MFr Bld: 7.8 % — ABNORMAL HIGH (ref 4.8–5.6)

## 2018-08-11 ENCOUNTER — Other Ambulatory Visit (HOSPITAL_COMMUNITY): Payer: Self-pay | Admitting: *Deleted

## 2018-08-14 ENCOUNTER — Encounter (HOSPITAL_COMMUNITY): Payer: 59

## 2018-08-21 ENCOUNTER — Encounter (HOSPITAL_COMMUNITY): Payer: 59

## 2018-08-24 ENCOUNTER — Other Ambulatory Visit: Payer: Self-pay

## 2018-08-24 ENCOUNTER — Ambulatory Visit (HOSPITAL_COMMUNITY)
Admission: RE | Admit: 2018-08-24 | Discharge: 2018-08-24 | Disposition: A | Discharge status: Home / Self Care | Payer: 59 | Source: Ambulatory Visit | Attending: Internal Medicine | Admitting: Internal Medicine

## 2018-08-24 DIAGNOSIS — D649 Anemia, unspecified: Secondary | ICD-10-CM | POA: Diagnosis not present

## 2018-08-24 MED ORDER — SODIUM CHLORIDE 0.9 % IV SOLN
510.0000 mg | INTRAVENOUS | Status: DC
  Administered 2018-08-24: 510 mg via INTRAVENOUS
  Filled 2018-08-24: qty 510

## 2018-08-24 NOTE — Discharge Instructions (Signed)

## 2018-08-31 ENCOUNTER — Encounter (HOSPITAL_COMMUNITY): Payer: 59

## 2018-09-11 ENCOUNTER — Other Ambulatory Visit: Payer: Self-pay | Admitting: Internal Medicine

## 2018-12-26 ENCOUNTER — Other Ambulatory Visit (HOSPITAL_COMMUNITY): Payer: Self-pay | Admitting: *Deleted

## 2018-12-27 ENCOUNTER — Ambulatory Visit (HOSPITAL_COMMUNITY)
Admission: RE | Admit: 2018-12-27 | Discharge: 2018-12-27 | Disposition: A | Discharge status: Home / Self Care | Payer: 59 | Source: Ambulatory Visit | Attending: Internal Medicine | Admitting: Internal Medicine

## 2018-12-27 ENCOUNTER — Other Ambulatory Visit: Payer: Self-pay

## 2018-12-27 DIAGNOSIS — D509 Iron deficiency anemia, unspecified: Secondary | ICD-10-CM | POA: Diagnosis present

## 2018-12-27 MED ORDER — SODIUM CHLORIDE 0.9 % IV SOLN
510.0000 mg | INTRAVENOUS | Status: DC
  Administered 2018-12-27: 10:00:00 510 mg via INTRAVENOUS
  Filled 2018-12-27: qty 17

## 2019-01-03 ENCOUNTER — Encounter (HOSPITAL_COMMUNITY)
Admission: RE | Admit: 2019-01-03 | Discharge: 2019-01-03 | Disposition: A | Discharge status: Home / Self Care | Payer: 59 | Source: Ambulatory Visit | Attending: Internal Medicine | Admitting: Internal Medicine

## 2019-01-03 ENCOUNTER — Other Ambulatory Visit: Payer: Self-pay

## 2019-01-03 ENCOUNTER — Encounter (HOSPITAL_COMMUNITY): Payer: 59

## 2019-01-03 DIAGNOSIS — D509 Iron deficiency anemia, unspecified: Secondary | ICD-10-CM | POA: Insufficient documentation

## 2019-01-03 MED ORDER — SODIUM CHLORIDE 0.9 % IV SOLN
510.0000 mg | INTRAVENOUS | Status: DC
  Administered 2019-01-03: 510 mg via INTRAVENOUS
  Filled 2019-01-03: qty 17

## 2019-01-30 ENCOUNTER — Other Ambulatory Visit: Payer: Self-pay | Admitting: Sports Medicine

## 2019-01-30 DIAGNOSIS — M75100 Unspecified rotator cuff tear or rupture of unspecified shoulder, not specified as traumatic: Secondary | ICD-10-CM

## 2019-01-30 DIAGNOSIS — M25512 Pain in left shoulder: Secondary | ICD-10-CM

## 2019-02-15 ENCOUNTER — Other Ambulatory Visit: Payer: Self-pay

## 2019-02-15 ENCOUNTER — Ambulatory Visit
Admission: RE | Admit: 2019-02-15 | Discharge: 2019-02-15 | Disposition: A | Discharge status: Home / Self Care | Payer: 59 | Source: Ambulatory Visit | Attending: Sports Medicine | Admitting: Sports Medicine

## 2019-02-15 DIAGNOSIS — M75100 Unspecified rotator cuff tear or rupture of unspecified shoulder, not specified as traumatic: Secondary | ICD-10-CM

## 2019-02-15 DIAGNOSIS — M25512 Pain in left shoulder: Secondary | ICD-10-CM

## 2019-02-28 ENCOUNTER — Telehealth: Payer: Self-pay | Admitting: Internal Medicine

## 2019-02-28 NOTE — Telephone Encounter (Signed)
New Message  Shea Stakes calling from [employer] stating that on 02/20/19 a request for medical records was sent electronically and he was just following up and making sure it has been received. Please give Shea Stakes a call back to confirm.

## 2019-03-01 NOTE — Telephone Encounter (Addendum)
This has been forwarded already to medical records. Per chart review a ROI was received yesterday and scanned into chart today.

## 2019-03-15 ENCOUNTER — Other Ambulatory Visit (HOSPITAL_COMMUNITY): Payer: Self-pay

## 2019-03-16 ENCOUNTER — Ambulatory Visit (HOSPITAL_COMMUNITY)
Admission: RE | Admit: 2019-03-16 | Discharge: 2019-03-16 | Disposition: A | Discharge status: Home / Self Care | Payer: 59 | Source: Ambulatory Visit | Attending: Internal Medicine | Admitting: Internal Medicine

## 2019-03-16 ENCOUNTER — Other Ambulatory Visit: Payer: Self-pay

## 2019-03-16 DIAGNOSIS — D649 Anemia, unspecified: Secondary | ICD-10-CM | POA: Insufficient documentation

## 2019-03-16 MED ORDER — SODIUM CHLORIDE 0.9 % IV SOLN
510.0000 mg | INTRAVENOUS | Status: DC
  Administered 2019-03-16: 510 mg via INTRAVENOUS
  Filled 2019-03-16: qty 17

## 2019-03-23 ENCOUNTER — Other Ambulatory Visit: Payer: Self-pay

## 2019-03-23 ENCOUNTER — Ambulatory Visit (HOSPITAL_COMMUNITY)
Admission: RE | Admit: 2019-03-23 | Discharge: 2019-03-23 | Disposition: A | Discharge status: Home / Self Care | Payer: 59 | Source: Ambulatory Visit | Attending: Internal Medicine | Admitting: Internal Medicine

## 2019-03-23 DIAGNOSIS — D649 Anemia, unspecified: Secondary | ICD-10-CM | POA: Diagnosis present

## 2019-03-23 MED ORDER — SODIUM CHLORIDE 0.9 % IV SOLN
510.0000 mg | INTRAVENOUS | Status: DC
  Administered 2019-03-23: 510 mg via INTRAVENOUS
  Filled 2019-03-23: qty 510

## 2019-05-09 ENCOUNTER — Other Ambulatory Visit: Payer: Self-pay | Admitting: Internal Medicine

## 2019-05-17 NOTE — Progress Notes (Signed)
Cardiology Office Note   Date:  05/18/2019   ID:  Melinda [name] 1960-07-12, MRN [id number]  PCP:  Crist Infante, MD  Cardiologist:   Dorris Carnes, MD   F/U of Chest tightness     History of Present Illness: Melinda [name] is a 58 y.o. female with a history of CP. Stress test, cath and cardiac CT in past  All negative     Pt saw B Bhagat in September 2018   Had epigastric discomfort Also had increased palpitations   With and without activity  USN abdomen negative  Emptying study neg  Colon neg  Followed by Dr Oletta Lamas    Recom increased propranolol     I saw her back in Dec 2019 the patient says she is doing well.  She currently follows with Hardie Shackleton in medicine clinic.  She denies chest pain.  Breathing is okay.  Occasional fluttering in her chest.  Short-lived.  No dizziness.  Current Meds  Medication Sig  . clorazepate (TRANXENE) 7.5 MG tablet Take 7.5 mg by mouth 2 (two) times daily.  . CRESTOR 5 MG tablet Take 5 mg by mouth every evening.   [name] esomeprazole (NEXIUM) 40 MG capsule Take 40 mg by mouth daily at 12 noon.  [name] FLUoxetine (PROZAC) 20 MG tablet Take 20 mg by mouth daily.   . methenamine (HIPREX) 1 g tablet Take 0.5 g by mouth as directed.   . nitroGLYCERIN (NITROSTAT) 0.4 MG SL tablet Place 1 tablet (0.4 mg total) under the tongue every 5 (five) minutes as needed for chest pain.  [name] NU-IRON 150 MG capsule Take 150 mg by mouth 2 (two) times daily.  . propranolol (INDERAL) 10 MG tablet TAKE 1 TABLET BY MOUTH TWICE A DAY     Allergies:   Cephalexin; Amoxapine and related; Amoxicillin-pot clavulanate; Antihistamines, diphenhydramine-type; Atorvastatin; Cefaclor; Cephalexin; Ciprofloxacin; Codeine; Doxycycline calcium; Fenofibrate micronized; Fish oil; Flagyl [metronidazole hcl]; Levofloxacin in d5w; Macrobid [nitrofurantoin monohyd macro]; Macrodantin; Metronidazole; Ofloxacin; Other; Prednisolone; Promethazine hcl; Promethazine hcl; Sulfamethoxazole-trimethoprim;  Tetracycline hcl; Tetracyclines & related; Erythromycin; Nitrofurantoin; and Sulfa antibiotics   Past Medical History:  Diagnosis Date  . Diabetes mellitus type 2, controlled ([place])   . Dyslipidemia    untreated  . GERD (gastroesophageal reflux disease)   . Microcytic anemia 06/22/2012  . Nephrolith   . Palpitation    positive tilt table test. Prozac and Inderal treatment effective.    Past Surgical History:  Procedure Laterality Date  . APPENDECTOMY    . BREAST REDUCTION SURGERY    . CARDIAC CATHETERIZATION  2002   normal LV function and no significant coronary obstruction  . ESOPHAGEAL MANOMETRY N/A 04/04/2017   Procedure: ESOPHAGEAL MANOMETRY (EM);  Surgeon: Laurence Spates, MD;  Location: [facility];  Service: Endoscopy;  Laterality: N/A;  . KIDNEY SURGERY    . PARTIAL HYSTERECTOMY    . TONSILLECTOMY       Social History:  The patient  reports that she has been smoking e-cigarettes. She has never used smokeless tobacco. She reports that she does not drink alcohol or use drugs.   Family History:  The patient's family history includes Cancer in her father; Cancer (age of onset: 22) in her maternal grandmother; Coronary artery disease in her unknown relative; Diabetes in her unknown relative; Heart attack in her father and unknown relative; Heart disease in her father and mother; Liver disease in her mother.    ROS:  Please see the history of present illness. All  other systems are reviewed and  Negative to the above problem except as noted.    PHYSICAL EXAM: VS:  BP 108/78   Pulse 69   Ht 5\' 6"  (1.676 m)   Wt 153 lb 3.2 oz (69.5 kg)   BMI 24.73 kg/m   GEN: Well nourished, well developed, in no acute distress  HEENT: normal  Neck: JVP is not elevated    Cardiac: RRR; no murmurs, rubs, or gallops,no edema  Respiratory:  clear to auscultation bilaterally, normal work of breathing  Airflow does appear sl down   GI: soft, nontender, nondistended, + BS  No hepatomegaly  MS:  no deformity Moving all extremities   Skin: warm and dry, no rash Neuro:  Strength and sensation are intact Psych: euthymic mood, full affect   EKG:  EKG shows sinus rhythm 69 bpm.  Motion artifact..  Lipid Panel    Component Value Date/Time   CHOL 152 05/15/2018 1301   TRIG 295 (H) 05/15/2018 1301   HDL 32 (L) 05/15/2018 1301   CHOLHDL 4.8 (H) 05/15/2018 1301   CHOLHDL 5.5 (H) 08/25/2015 0747   VLDL 62 (H) 08/25/2015 0747   LDLCALC 61 05/15/2018 1301      Wt Readings from Last 3 Encounters:  05/18/19 153 lb 3.2 oz (69.5 kg)  12/27/18 147 lb (66.7 kg)  08/24/18 160 lb (72.6 kg)      ASSESSMENT AND PLAN:  1  CP patient asymptomatic we will continue to follow.  2  Palpitations   infrequent short-lived.  3   Lipids last lipids in February LDL 80.  HDL 29.  Keep on same regimen.  Encouraged her to stay active.  4   Endocrine hemoglobin A1c was 5.3 in September.  I will set to see in 1 years time sooner with problems.  Current medicines are reviewed at length with the patient today.  The patient does not have concerns regarding medicines.  Signed, Dorris Carnes, MD  05/18/2019 2:50 PM    [name], [place], [place]  [address] Phone: [phone]; Fax: [phone]

## 2019-05-18 ENCOUNTER — Encounter: Payer: Self-pay | Admitting: Internal Medicine

## 2019-05-18 ENCOUNTER — Ambulatory Visit (INDEPENDENT_AMBULATORY_CARE_PROVIDER_SITE_OTHER): Payer: 59 | Admitting: Internal Medicine

## 2019-05-18 ENCOUNTER — Other Ambulatory Visit: Payer: Self-pay

## 2019-05-18 VITALS — BP 108/78 | HR 69 | Ht 66.0 in | Wt 153.2 lb

## 2019-05-18 DIAGNOSIS — E785 Hyperlipidemia, unspecified: Secondary | ICD-10-CM

## 2019-05-18 MED ORDER — NITROGLYCERIN 0.4 MG SL SUBL: 0.4000 mg | SUBLINGUAL_TABLET | SUBLINGUAL | 3 refills | Status: DC | PRN

## 2019-05-18 NOTE — Patient Instructions (Signed)
Medication Instructions:  No changes today *If you need a refill on your cardiac medications before your next appointment, please call your pharmacy*  Lab Work: none If you have labs (blood work) drawn today and your tests are completely normal, you will receive your results only by: [name] MyChart Message (if you have MyChart) OR . A paper copy in the mail If you have any lab test that is abnormal or we need to change your treatment, we will call you to review the results.  Testing/Procedures: none  Follow-Up: At [facility], you and your health needs are our priority.  As part of our continuing mission to provide you with exceptional heart care, we have created designated Provider Care Teams.  These Care Teams include your primary Cardiologist (physician) and Advanced Practice Providers (APPs -  Physician Assistants and Nurse Practitioners) who all work together to provide you with the care you need, when you need it.  Your next appointment:   12 month(s)  The format for your next appointment:   Either In Person or Virtual  Provider:   You may see Dr. Dorris Carnes or one of the following Advanced Practice Providers on your designated Care Team:    Richardson Dopp, PA-C  [place], [place]  Daune Perch, NP   Other Instructions

## 2019-06-29 DIAGNOSIS — K644 Residual hemorrhoidal skin tags: Secondary | ICD-10-CM | POA: Diagnosis not present

## 2019-07-04 ENCOUNTER — Other Ambulatory Visit: Payer: Self-pay

## 2019-07-04 ENCOUNTER — Encounter (HOSPITAL_BASED_OUTPATIENT_CLINIC_OR_DEPARTMENT_OTHER): Payer: Self-pay | Admitting: Surgery

## 2019-07-10 ENCOUNTER — Other Ambulatory Visit (HOSPITAL_COMMUNITY): Payer: 59

## 2019-07-10 ENCOUNTER — Other Ambulatory Visit: Payer: Self-pay | Admitting: Internal Medicine

## 2019-07-10 ENCOUNTER — Other Ambulatory Visit (HOSPITAL_COMMUNITY)
Admission: RE | Admit: 2019-07-10 | Discharge: 2019-07-10 | Disposition: A | Discharge status: Home / Self Care | Payer: BC Managed Care – PPO | Source: Ambulatory Visit | Attending: Surgery | Admitting: Surgery

## 2019-07-10 ENCOUNTER — Other Ambulatory Visit: Payer: Self-pay

## 2019-07-10 ENCOUNTER — Encounter (HOSPITAL_BASED_OUTPATIENT_CLINIC_OR_DEPARTMENT_OTHER)
Admission: RE | Admit: 2019-07-10 | Discharge: 2019-07-10 | Disposition: A | Discharge status: Home / Self Care | Payer: Self-pay | Source: Ambulatory Visit | Attending: Surgery | Admitting: Surgery

## 2019-07-10 DIAGNOSIS — Z01812 Encounter for preprocedural laboratory examination: Secondary | ICD-10-CM | POA: Diagnosis not present

## 2019-07-10 DIAGNOSIS — Z20822 Contact with and (suspected) exposure to covid-19: Secondary | ICD-10-CM | POA: Diagnosis not present

## 2019-07-10 LAB — BASIC METABOLIC PANEL
Anion gap: 9 (ref 5–15)
BUN: 23 mg/dL — ABNORMAL HIGH (ref 6–20)
CO2: 25 mmol/L (ref 22–32)
Calcium: 9.4 mg/dL (ref 8.9–10.3)
Chloride: 98 mmol/L (ref 98–111)
Creatinine, Ser: 0.78 mg/dL (ref 0.44–1.00)
GFR calc Af Amer: >60 mL/min (ref >60)
GFR calc non Af Amer: >60 mL/min (ref >60)
Glucose, Bld: 233 mg/dL — ABNORMAL HIGH (ref 70–99)
Potassium: 4.4 mmol/L (ref 3.5–5.1)
Sodium: 132 mmol/L — ABNORMAL LOW (ref 135–145)

## 2019-07-10 LAB — SARS CORONAVIRUS 2 (TAT 6-24 HRS): SARS Coronavirus 2: NEGATIVE

## 2019-07-10 NOTE — Progress Notes (Signed)

## 2019-07-12 NOTE — Anesthesia Preprocedure Evaluation (Addendum)
Anesthesia Evaluation  Patient identified by MRN, date of birth, ID band Patient awake    Reviewed: Allergy & Precautions, NPO status , Patient's Chart, lab work & pertinent test results  History of Anesthesia Complications Negative for: history of anesthetic complications  Airway Mallampati: II  TM Distance: >3 FB Neck ROM: Full    Dental no notable dental hx.    Pulmonary former smoker,    Pulmonary exam normal        Cardiovascular Pt. on home beta blockers Normal cardiovascular exam     Neuro/Psych negative neurological ROS  negative psych ROS   GI/Hepatic Neg liver ROS, GERD  Medicated,  Endo/Other  diabetes, Type 2  Renal/GU negative Renal ROS  negative genitourinary   Musculoskeletal negative musculoskeletal ROS (+)   Abdominal   Peds  Hematology negative hematology ROS (+)   Anesthesia Other Findings Day of surgery medications reviewed with patient.  Reproductive/Obstetrics negative OB ROS                           Anesthesia Physical Anesthesia Plan  ASA: II  Anesthesia Plan: General   Post-op Pain Management:    Induction: Intravenous  PONV Risk Score and Plan: 4 or greater and Treatment may vary due to age or medical condition, Ondansetron, Dexamethasone and Midazolam  Airway Management Planned: LMA  Additional Equipment: None  Intra-op Plan:   Post-operative Plan: Extubation in OR  Informed Consent: I have reviewed the patients History and Physical, chart, labs and discussed the procedure including the risks, benefits and alternatives for the proposed anesthesia with the patient or authorized representative who has indicated his/her understanding and acceptance.     Dental advisory given  Plan Discussed with: CRNA  Anesthesia Plan Comments:        Anesthesia Quick Evaluation

## 2019-07-13 ENCOUNTER — Ambulatory Visit: Payer: Self-pay | Admitting: Surgery

## 2019-07-13 ENCOUNTER — Ambulatory Visit (HOSPITAL_BASED_OUTPATIENT_CLINIC_OR_DEPARTMENT_OTHER): Payer: BC Managed Care – PPO | Admitting: Anesthesiology

## 2019-07-13 ENCOUNTER — Encounter (HOSPITAL_BASED_OUTPATIENT_CLINIC_OR_DEPARTMENT_OTHER)
Admission: RE | Disposition: A | Discharge status: Home / Self Care | Payer: Self-pay | Source: Ambulatory Visit | Attending: Surgery

## 2019-07-13 ENCOUNTER — Other Ambulatory Visit: Payer: Self-pay

## 2019-07-13 ENCOUNTER — Encounter (HOSPITAL_BASED_OUTPATIENT_CLINIC_OR_DEPARTMENT_OTHER): Payer: Self-pay | Admitting: Surgery

## 2019-07-13 ENCOUNTER — Ambulatory Visit (HOSPITAL_BASED_OUTPATIENT_CLINIC_OR_DEPARTMENT_OTHER)
Admission: RE | Admit: 2019-07-13 | Discharge: 2019-07-13 | Disposition: A | Discharge status: Home / Self Care | Payer: BC Managed Care – PPO | Source: Ambulatory Visit | Attending: Surgery | Admitting: Surgery

## 2019-07-13 DIAGNOSIS — K644 Residual hemorrhoidal skin tags: Secondary | ICD-10-CM | POA: Diagnosis not present

## 2019-07-13 DIAGNOSIS — C211 Malignant neoplasm of anal canal: Secondary | ICD-10-CM | POA: Diagnosis not present

## 2019-07-13 DIAGNOSIS — C210 Malignant neoplasm of anus, unspecified: Secondary | ICD-10-CM | POA: Diagnosis not present

## 2019-07-13 DIAGNOSIS — Z79899 Other long term (current) drug therapy: Secondary | ICD-10-CM | POA: Diagnosis not present

## 2019-07-13 DIAGNOSIS — E119 Type 2 diabetes mellitus without complications: Secondary | ICD-10-CM | POA: Diagnosis not present

## 2019-07-13 DIAGNOSIS — Z833 Family history of diabetes mellitus: Secondary | ICD-10-CM | POA: Insufficient documentation

## 2019-07-13 DIAGNOSIS — E785 Hyperlipidemia, unspecified: Secondary | ICD-10-CM | POA: Insufficient documentation

## 2019-07-13 DIAGNOSIS — Z8249 Family history of ischemic heart disease and other diseases of the circulatory system: Secondary | ICD-10-CM | POA: Insufficient documentation

## 2019-07-13 DIAGNOSIS — K219 Gastro-esophageal reflux disease without esophagitis: Secondary | ICD-10-CM | POA: Diagnosis not present

## 2019-07-13 DIAGNOSIS — I10 Essential (primary) hypertension: Secondary | ICD-10-CM | POA: Insufficient documentation

## 2019-07-13 DIAGNOSIS — K648 Other hemorrhoids: Secondary | ICD-10-CM | POA: Diagnosis not present

## 2019-07-13 DIAGNOSIS — Z87891 Personal history of nicotine dependence: Secondary | ICD-10-CM | POA: Insufficient documentation

## 2019-07-13 HISTORY — DX: Unspecified hemorrhoids: K64.9

## 2019-07-13 HISTORY — PX: HEMORRHOID SURGERY: SHX153

## 2019-07-13 LAB — GLUCOSE, CAPILLARY
Glucose-Capillary: 101 mg/dL — ABNORMAL HIGH (ref 70–99)
Glucose-Capillary: 82 mg/dL (ref 70–99)

## 2019-07-13 SURGERY — EXAM UNDER ANESTHESIA
Anesthesia: General | Site: Anus

## 2019-07-13 MED ORDER — SOD CITRATE-CITRIC ACID 500-334 MG/5ML PO SOLN
ORAL | Status: AC
  Filled 2019-07-13: qty 15

## 2019-07-13 MED ORDER — LIDOCAINE HCL (CARDIAC) PF 100 MG/5ML IV SOSY
PREFILLED_SYRINGE | INTRAVENOUS | Status: DC | PRN
  Administered 2019-07-13: 80 mg via INTRAVENOUS

## 2019-07-13 MED ORDER — CHLORHEXIDINE GLUCONATE CLOTH 2 % EX PADS: 6.0000 | MEDICATED_PAD | Freq: Once | CUTANEOUS | Status: DC

## 2019-07-13 MED ORDER — HYDROCODONE-ACETAMINOPHEN 5-325 MG PO TABS: 1.0000 | ORAL_TABLET | Freq: Four times a day (QID) | ORAL | 0 refills | Status: DC | PRN

## 2019-07-13 MED ORDER — FENTANYL CITRATE (PF) 100 MCG/2ML IJ SOLN
INTRAMUSCULAR | Status: AC
  Filled 2019-07-13: qty 2

## 2019-07-13 MED ORDER — LACTATED RINGERS IV SOLN: INTRAVENOUS | Status: DC

## 2019-07-13 MED ORDER — IBUPROFEN 800 MG PO TABS: 800.0000 mg | ORAL_TABLET | Freq: Three times a day (TID) | ORAL | 0 refills | Status: DC | PRN

## 2019-07-13 MED ORDER — BUPIVACAINE LIPOSOME 1.3 % IJ SUSP
INTRAMUSCULAR | Status: DC | PRN
  Administered 2019-07-13: 20 mL

## 2019-07-13 MED ORDER — FENTANYL CITRATE (PF) 100 MCG/2ML IJ SOLN: 25.0000 ug | INTRAMUSCULAR | Status: DC | PRN

## 2019-07-13 MED ORDER — 0.9 % SODIUM CHLORIDE (POUR BTL) OPTIME
TOPICAL | Status: DC | PRN
  Administered 2019-07-13: 10:00:00 1000 mL

## 2019-07-13 MED ORDER — ACETAMINOPHEN 500 MG PO TABS
ORAL_TABLET | ORAL | Status: AC
  Filled 2019-07-13: qty 2

## 2019-07-13 MED ORDER — EPHEDRINE SULFATE 50 MG/ML IJ SOLN: INTRAMUSCULAR | Status: DC | PRN

## 2019-07-13 MED ORDER — MIDAZOLAM HCL 2 MG/2ML IJ SOLN
INTRAMUSCULAR | Status: AC
  Filled 2019-07-13: qty 2

## 2019-07-13 MED ORDER — ACETAMINOPHEN 500 MG PO TABS
1000.0000 mg | ORAL_TABLET | Freq: Once | ORAL | Status: AC
  Administered 2019-07-13: 1000 mg via ORAL

## 2019-07-13 MED ORDER — ONDANSETRON HCL 4 MG/2ML IJ SOLN
INTRAMUSCULAR | Status: DC | PRN
  Administered 2019-07-13: 4 mg via INTRAVENOUS

## 2019-07-13 MED ORDER — SOD CITRATE-CITRIC ACID 500-334 MG/5ML PO SOLN
15.0000 mL | Freq: Once | ORAL | Status: AC
  Administered 2019-07-13: 11:00:00 15 mL via ORAL

## 2019-07-13 MED ORDER — PROPOFOL 10 MG/ML IV BOLUS
INTRAVENOUS | Status: DC | PRN
  Administered 2019-07-13: 150 mg via INTRAVENOUS

## 2019-07-13 MED ORDER — MIDAZOLAM HCL 2 MG/2ML IJ SOLN
1.0000 mg | INTRAMUSCULAR | Status: DC | PRN
  Administered 2019-07-13: 09:00:00 2 mg via INTRAVENOUS

## 2019-07-13 MED ORDER — BUPIVACAINE LIPOSOME 1.3 % IJ SUSP: 20.0000 mL | Freq: Once | INTRAMUSCULAR | Status: DC

## 2019-07-13 MED ORDER — FENTANYL CITRATE (PF) 100 MCG/2ML IJ SOLN
50.0000 ug | INTRAMUSCULAR | Status: DC | PRN
  Administered 2019-07-13 (×2): 50 ug via INTRAVENOUS

## 2019-07-13 MED ORDER — MIDAZOLAM HCL 2 MG/2ML IJ SOLN
1.0000 mg | Freq: Once | INTRAMUSCULAR | Status: AC
  Administered 2019-07-13: 11:00:00 1 mg via INTRAVENOUS

## 2019-07-13 SURGICAL SUPPLY — 43 items
BLADE SURG 15 STRL LF DISP TIS (BLADE) ×1 IMPLANT
BLADE SURG 15 STRL SS (BLADE) ×2
BRIEF STRETCH FOR OB PAD XXL (UNDERPADS AND DIAPERS) ×3 IMPLANT
CANISTER SUCT 1200ML W/VALVE (MISCELLANEOUS) ×3 IMPLANT
COVER MAYO STAND STRL (DRAPES) IMPLANT
COVER WAND RF STERILE (DRAPES) IMPLANT
DECANTER SPIKE VIAL GLASS SM (MISCELLANEOUS) ×3 IMPLANT
DRSG PAD ABDOMINAL 8X10 ST (GAUZE/BANDAGES/DRESSINGS) IMPLANT
ELECT REM PT RETURN 9FT ADLT (ELECTROSURGICAL) ×3
ELECTRODE REM PT RTRN 9FT ADLT (ELECTROSURGICAL) ×1 IMPLANT
GAUZE PETROLATUM 1 X8 (GAUZE/BANDAGES/DRESSINGS) IMPLANT
GAUZE SPONGE 4X4 12PLY STRL (GAUZE/BANDAGES/DRESSINGS) ×3 IMPLANT
GAUZE SPONGE 4X4 12PLY STRL LF (GAUZE/BANDAGES/DRESSINGS) ×6 IMPLANT
GLOVE BIO SURGEON STRL SZ8 (GLOVE) ×3 IMPLANT
GOWN STRL REUS W/ TWL LRG LVL3 (GOWN DISPOSABLE) ×2 IMPLANT
GOWN STRL REUS W/ TWL XL LVL3 (GOWN DISPOSABLE) ×1 IMPLANT
GOWN STRL REUS W/TWL LRG LVL3 (GOWN DISPOSABLE) ×4
GOWN STRL REUS W/TWL XL LVL3 (GOWN DISPOSABLE) ×2
IV CATH PLACEMENT UNIT 16 GA (IV SOLUTION) IMPLANT
NEEDLE HYPO 25X1 1.5 SAFETY (NEEDLE) ×3 IMPLANT
NS IRRIG 1000ML POUR BTL (IV SOLUTION) ×3 IMPLANT
PACK BASIN DAY SURGERY FS (CUSTOM PROCEDURE TRAY) ×3 IMPLANT
PACK LITHOTOMY IV (CUSTOM PROCEDURE TRAY) ×3 IMPLANT
PENCIL SMOKE EVACUATOR (MISCELLANEOUS) ×3 IMPLANT
SHEARS HARMONIC 9CM CVD (BLADE) ×3 IMPLANT
SHEET MEDIUM DRAPE 40X70 STRL (DRAPES) ×3 IMPLANT
SLEEVE SCD COMPRESS KNEE MED (MISCELLANEOUS) ×3 IMPLANT
SURGILUBE 2OZ TUBE FLIPTOP (MISCELLANEOUS) ×3 IMPLANT
SUT CHROMIC 2 0 SH (SUTURE) IMPLANT
SUT CHROMIC 3 0 SH 27 (SUTURE) IMPLANT
SUT SILK 2 0 TIES 17X18 (SUTURE)
SUT SILK 2-0 18XBRD TIE BLK (SUTURE) IMPLANT
SUT VIC AB 3-0 PS1 18 (SUTURE)
SUT VIC AB 3-0 PS1 18XBRD (SUTURE) IMPLANT
SYR 10ML LL (SYRINGE) IMPLANT
SYR CONTROL 10ML LL (SYRINGE) ×3 IMPLANT
TOWEL GREEN STERILE FF (TOWEL DISPOSABLE) ×6 IMPLANT
TRAY DSU PREP LF (CUSTOM PROCEDURE TRAY) ×3 IMPLANT
TRAY PROCTOSCOPIC FIBER OPTIC (SET/KITS/TRAYS/PACK) ×3 IMPLANT
TUBE CONNECTING 20'X1/4 (TUBING) ×1
TUBE CONNECTING 20X1/4 (TUBING) ×2 IMPLANT
UNDERPAD 30X36 HEAVY ABSORB (UNDERPADS AND DIAPERS) ×3 IMPLANT
YANKAUER SUCT BULB TIP NO VENT (SUCTIONS) ×3 IMPLANT

## 2019-07-13 NOTE — H&P (Signed)
Chief Complaint:  Prolapsed hemorrohoid with hygiene issues  History of Present Illness:  Melinda [name] is an 59 y.o. female who has been followed by Earlie Raveling with multiple bandings has a prolapsed int/ext hemorrhoid with internal mucosa on the outside.  She presents for EUA and hemorrhoidectomy.    Past Medical History:  Diagnosis Date  . Diabetes mellitus type 2, controlled ([place])   . Dyslipidemia    untreated  . GERD (gastroesophageal reflux disease)   . Hemorrhoids   . Microcytic anemia 06/22/2012  . Nephrolith   . Palpitation    positive tilt table test. Prozac and Inderal treatment effective.    Past Surgical History:  Procedure Laterality Date  . APPENDECTOMY    . BREAST REDUCTION SURGERY    . CARDIAC CATHETERIZATION  2002   normal LV function and no significant coronary obstruction  . CATARACT EXTRACTION, BILATERAL Bilateral november 2020 and dcember 2020  . ESOPHAGEAL MANOMETRY N/A 04/04/2017   Procedure: ESOPHAGEAL MANOMETRY (EM);  Surgeon: Laurence Spates, MD;  Location: [facility];  Service: Endoscopy;  Laterality: N/A;  . KIDNEY SURGERY     kidney stones   . PARTIAL HYSTERECTOMY    . TONSILLECTOMY      Current Facility-Administered Medications  Medication Dose Route Frequency Provider Last Rate Last Admin  . bupivacaine liposome (EXPAREL) 1.3 % injection 266 mg  20 mL Infiltration Once Johnathan Hausen, MD      . Chlorhexidine Gluconate Cloth 2 % PADS 6 each  6 each Topical Once Johnathan Hausen, MD       And  . Chlorhexidine Gluconate Cloth 2 % PADS 6 each  6 each Topical Once Johnathan Hausen, MD      . fentaNYL (SUBLIMAZE) injection 50 mcg  50 mcg Intravenous Q5 min PRN Nolon Nations, MD      . lactated ringers infusion   Intravenous Continuous Nolon Nations, MD 10 mL/hr at 07/13/19 0815 New Bag at 07/13/19 0815  . midazolam (VERSED) injection 1-2 mg  1-2 mg Intravenous Q5 min PRN Nolon Nations, MD       Cephalexin; Amoxapine and related;  Amoxicillin-pot clavulanate; Antihistamines, diphenhydramine-type; Atorvastatin; Cefaclor; Ciprofloxacin; Codeine; Doxycycline calcium; Fenofibrate micronized; Fish oil; Flagyl [metronidazole hcl]; Levofloxacin in d5w; Metronidazole; Ofloxacin; Prednisolone; Promethazine hcl; Sulfamethoxazole-trimethoprim; Tetracycline hcl; Erythromycin; Nitrofurantoin; and Sulfa antibiotics Family History  Problem Relation Age of Onset  . Heart disease Mother   . Liver disease Mother   . Heart disease Father   . Heart attack Father   . Cancer Father        prostate CA  . Cancer Maternal Grandmother 60       ovarian cancer  . Coronary artery disease Other        Fx of  . Heart attack Other   . Diabetes Other    Social History:   reports that she quit smoking about 2 years ago. Her smoking use included e-cigarettes. She has never used smokeless tobacco. She reports that she does not drink alcohol or use drugs.   REVIEW OF SYSTEMS : Negative except for see problem list  Physical Exam:   Blood pressure 105/80, pulse 73, temperature 97.7 F (36.5 C), temperature source Oral, resp. rate 18, height 5\' 6"  (1.676 m), weight 72.1 kg, SpO2 99 %. Body mass index is 25.66 kg/m.  Gen:  WDWN WF NAD  Neurological: Alert and oriented to person, place, and time. Motor and sensory function is grossly intact  Head: Normocephalic and atraumatic.  Eyes: Conjunctivae  are normal. Pupils are equal, round, and reactive to light. No scleral icterus.  Neck: Normal range of motion. Neck supple. No tracheal deviation or thyromegaly present.  GU:  External prolapsed hemorrhoid Musculoskeletal: Normal range of motion. Extremities are nontender. No cyanosis, edema or clubbing noted Lymphadenopathy: No cervical, preauricular, postauricular or axillary adenopathy is present Skin: Skin is warm and dry. No rash noted. No diaphoresis. No erythema. No pallor. Pscyh: Normal mood and affect. Behavior is normal. Judgment and thought  content normal.   LABORATORY RESULTS: Results for orders placed or performed during the hospital encounter of 07/13/19 (from the past 48 hour(s))  Glucose, capillary     Status: Abnormal   Collection Time: 07/13/19  8:05 AM  Result Value Ref Range   Glucose-Capillary 101 (H) 70 - 99 mg/dL     RADIOLOGY RESULTS: No results found.  Problem List: Patient Active Problem List   Diagnosis Date Noted  . Chest pain 06/26/2012  . Microcytic anemia 06/22/2012  . Near syncope 06/20/2012  . Heart palpitations 04/28/2011  . Hypertension 04/28/2011  . Dyslipidemia 04/28/2011    Assessment & Plan: Symptomatic external/internal hemorrhoid for EUA and excision.     Matt B. Hassell Done, MD, [facility] [phone] beeper [phone]  07/13/2019 8:57 AM

## 2019-07-13 NOTE — Interval H&P Note (Signed)
History and Physical Interval Note:  07/13/2019 8:59 AM  Melinda [name]  has presented today for surgery, with the diagnosis of HEMORRHOID PROLAPSED.  The various methods of treatment have been discussed with the patient and family. After consideration of risks, benefits and other options for treatment, the patient has consented to  Procedure(s): EXAM UNDER ANESTHESIA (N/A) HEMORRHOIDECTOMY (N/A) as a surgical intervention.  The patient's history has been reviewed, patient examined, no change in status, stable for surgery.  I have reviewed the patient's chart and labs.  Questions were answered to the patient's satisfaction.     Pedro Earls

## 2019-07-13 NOTE — Anesthesia Postprocedure Evaluation (Signed)
Anesthesia Post Note  Patient: Melinda [name]  Procedure(s) Performed: [name] UNDER ANESTHESIA (N/A Anus) HEMORRHOIDECTOMY (N/A Anus)     Patient location during evaluation: PACU Anesthesia Type: General Level of consciousness: awake and alert and oriented Pain management: pain level controlled Vital Signs Assessment: post-procedure vital signs reviewed and stable Respiratory status: spontaneous breathing, nonlabored ventilation and respiratory function stable Cardiovascular status: blood pressure returned to baseline Postop Assessment: no apparent nausea or vomiting Anesthetic complications: no Comments:  Pt c/o substernal chest and upper back "pressure" and SOB on arrival to PACU. Vital signs WNL, SpO2 100% on RA. Pt denies feeling anxious, states she has never had this type of discomfort in the past. Has not taken her antianxiety medication this morning. Pain not reproducible with palpation, denies nausea, lightheadedness, dizziness. 12 lead EKG obtained and compared with previous which were normal. Pt given Bicitra and midazolam 1mg  IV. After midazolam, pt states discomfort has almost completely resolved. Observed for additional 1.5 hours and discomfort in chest completely resolved. Mild tightness in upper back remains. Discussed with patient and her husband that I think the likelihood of ACS, PE, aortic dissection, or other life-threatening etiology is very low, however the most conservative approach would be to be transferred to ED for evaluation. I discussed with the patient and her husband that if they wish to go home, she should be seen immediately if her symptoms worsen. She would like to go home and is in agreement that she will be evaluated if her symptoms worsen or do not completely resolve.     Daiva Huge, MD    Last Vitals:  Vitals:   07/13/19 1200 07/13/19 1212  BP: 114/75 131/84  Pulse: 70 76  Resp: 10 18  Temp:  (!) 36.4 C  SpO2: 97% 96%    Last Pain:  Vitals:   07/13/19 1212  TempSrc: Oral  PainSc: 0-No pain                 Brennan Bailey

## 2019-07-13 NOTE — Op Note (Signed)
Melinda [name]  [birth year] 13 July 2019    PCP:  Crist Infante, MD   Surgeon: Kaylyn Lim, MD, FACS  Asst:  none  Anes:  general  Preop Dx: Symptomatic internal/external hemorrhoid  Postop Dx: same  Procedure: EUA, Excision of posterior internal/external hemorrhoid Location Surgery: CDS 8 Complications: none  EBL:   minimal cc  Drains: none  Description of Procedure:  The patient was taken to OR 8 .  After anesthesia was administered and the patient was prepped  with betadine and a timeout was performed.  Exam under anesthesia was performed which demonstrated internal hemorrhoids at the 10 oclock and 2 oclock positions.  Some of these have been banded in the past by Dr. Earlean Shawl.  In preop discussions, her main problem was hygiene and moisture and the posterior hemorrhoid came off the midline and had both squamous and columnar tissue and hence the liquid and mucous.    First, a complete anal block was performed using 20 cc of Exparel.  Next the Harmonic scalpel was used to excise the posterior hemorrhoid.  A 3-0 chromic was used to allign the skin edges.  No bleeding was noted.    The patient tolerated the procedure well and was taken to the PACU in stable condition.     Matt B. Hassell Done, [place], [facility], [place]

## 2019-07-13 NOTE — Transfer of Care (Signed)
Immediate Anesthesia Transfer of Care Note  Patient: Melinda [name]  Procedure(s) Performed: [name] UNDER ANESTHESIA (N/A Anus) HEMORRHOIDECTOMY (N/A Anus)  Patient Location: PACU  Anesthesia Type:General  Level of Consciousness: awake, alert , oriented and drowsy  Airway & Oxygen Therapy: Patient Spontanous Breathing and Patient connected to face mask oxygen  Post-op Assessment: Report given to RN and Post -op Vital signs reviewed and stable  Post vital signs: Reviewed and stable  Last Vitals:  Vitals Value Taken Time  BP 136/80 07/13/19 1015  Temp    Pulse 84 07/13/19 1016  Resp 10 07/13/19 1016  SpO2 100 % 07/13/19 1016  Vitals shown include unvalidated device data.  Last Pain:  Vitals:   07/13/19 0806  TempSrc: Oral  PainSc: 0-No pain      Patients Stated Pain Goal: 3 ([id number])  Complications: No apparent anesthesia complications

## 2019-07-13 NOTE — Discharge Instructions (Signed)
Hemorrhoids Hemorrhoids are swollen veins that may develop:  In the butt (rectum). These are called internal hemorrhoids.  Around the opening of the butt (anus). These are called external hemorrhoids. Hemorrhoids can cause pain, itching, or bleeding. Most of the time, they do not cause serious problems. They usually get better with diet changes, lifestyle changes, and other home treatments. What are the causes? This condition may be caused by:  Having trouble pooping (constipation).  Pushing hard (straining) to poop.  Watery poop (diarrhea).  Pregnancy.  Being very overweight (obese).  Sitting for long periods of time.  Heavy lifting or other activity that causes you to strain.  Anal sex.  Riding a bike for a long period of time. What are the signs or symptoms? Symptoms of this condition include:  Pain.  Itching or soreness in the butt.  Bleeding from the butt.  Leaking poop.  Swelling in the area.  One or more lumps around the opening of your butt. How is this diagnosed? A doctor can often diagnose this condition by looking at the affected area. The doctor may also:  Do an exam that involves feeling the area with a gloved hand (digital rectal exam).  Examine the area inside your butt using a small tube (anoscope).  Order blood tests. This may be done if you have lost a lot of blood.  Have you get a test that involves looking inside the colon using a flexible tube with a camera on the end (sigmoidoscopy or colonoscopy). How is this treated? This condition can usually be treated at home. Your doctor may tell you to change what you eat, make lifestyle changes, or try home treatments. If these do not help, procedures can be done to remove the hemorrhoids or make them smaller. These may involve:  Placing rubber bands at the base of the hemorrhoids to cut off their blood supply.  Injecting medicine into the hemorrhoids to shrink them.  Shining a type of light  energy onto the hemorrhoids to cause them to fall off.  Doing surgery to remove the hemorrhoids or cut off their blood supply. Follow these instructions at home: Eating and drinking   Eat foods that have a lot of fiber in them. These include whole grains, beans, nuts, fruits, and vegetables.  Ask your doctor about taking products that have added fiber (fibersupplements).  Reduce the amount of fat in your diet. You can do this by: ? Eating low-fat dairy products. ? Eating less red meat. ? Avoiding processed foods.  Drink enough fluid to keep your pee (urine) pale yellow. Managing pain and swelling   Take a warm-water bath (sitz bath) for 20 minutes to ease pain. Do this 3-4 times a day. You may do this in a bathtub or using a portable sitz bath that fits over the toilet.  If told, put ice on the painful area. It may be helpful to use ice between your warm baths. ? Put ice in a plastic bag. ? Place a towel between your skin and the bag. ? Leave the ice on for 20 minutes, 2-3 times a day. General instructions  Take over-the-counter and prescription medicines only as told by your doctor. ? Medicated creams and medicines may be used as told.  Exercise often. Ask your doctor how much and what kind of exercise is best for you.  Go to the bathroom when you have the urge to poop. Do not wait.  Avoid pushing too hard when you poop.  Keep your   butt dry and clean. Use wet toilet paper or moist towelettes after pooping.  Do not sit on the toilet for a long time.  Keep all follow-up visits as told by your doctor. This is important. Contact a doctor if you:  Have pain and swelling that do not get better with treatment or medicine.  Have trouble pooping.  Cannot poop.  Have pain or swelling outside the area of the hemorrhoids. Get help right away if you have:  Bleeding that will not stop. Summary  Hemorrhoids are swollen veins in the butt or around the opening of the  butt.  They can cause pain, itching, or bleeding.  Eat foods that have a lot of fiber in them. These include whole grains, beans, nuts, fruits, and vegetables.  Take a warm-water bath (sitz bath) for 20 minutes to ease pain. Do this 3-4 times a day. This information is not intended to replace advice given to you by your health care provider. Make sure you discuss any questions you have with your health care provider. Document Revised: 06/01/2018 Document Reviewed: 10/13/2017 Elsevier Patient Education  [place].    No Tylenol before 2:30pm  Post Anesthesia Home Care Instructions  Activity: Get plenty of rest for the remainder of the day. A responsible individual must stay with you for 24 hours following the procedure.  For the next 24 hours, DO NOT: -Drive a car -Paediatric nurse -Drink alcoholic beverages -Take any medication unless instructed by your physician -Make any legal decisions or sign important papers.  Meals: Start with liquid foods such as gelatin or soup. Progress to regular foods as tolerated. Avoid greasy, spicy, heavy foods. If nausea and/or vomiting occur, drink only clear liquids until the nausea and/or vomiting subsides. Call your physician if vomiting continues.  Special Instructions/Symptoms: Your throat may feel dry or sore from the anesthesia or the breathing tube placed in your throat during surgery. If this causes discomfort, gargle with warm salt water. The discomfort should disappear within 24 hours.  If you had a scopolamine patch placed behind your ear for the management of post- operative nausea and/or vomiting:  1. The medication in the patch is effective for 72 hours, after which it should be removed.  Wrap patch in a tissue and discard in the trash. Wash hands thoroughly with soap and water. 2. You may remove the patch earlier than 72 hours if you experience unpleasant side effects which may include dry mouth, dizziness or visual  disturbances. 3. Avoid touching the patch. Wash your hands with soap and water after contact with the patch.      Information for Discharge Teaching: EXPAREL (bupivacaine liposome injectable suspension)   Your surgeon or anesthesiologist gave you EXPAREL(bupivacaine) to help control your pain after surgery.   EXPAREL is a local anesthetic that provides pain relief by numbing the tissue around the surgical site.  EXPAREL is designed to release pain medication over time and can control pain for up to 72 hours.  Depending on how you respond to EXPAREL, you may require less pain medication during your recovery.  Possible side effects:  Temporary loss of sensation or ability to move in the area where bupivacaine was injected.  Nausea, vomiting, constipation  Rarely, numbness and tingling in your mouth or lips, lightheadedness, or anxiety may occur.  Call your doctor right away if you think you may be experiencing any of these sensations, or if you have other questions regarding possible side effects.  Follow all other discharge  instructions given to you by your surgeon or nurse. Eat a healthy diet and drink plenty of water or other fluids.  If you return to the hospital for any reason within 96 hours following the administration of EXPAREL, it is important for health care providers to know that you have received this anesthetic. A teal colored band has been placed on your arm with the date, time and amount of EXPAREL you have received in order to alert and inform your health care providers. Please leave this armband in place for the full 96 hours following administration, and then you may remove the band.

## 2019-07-13 NOTE — Anesthesia Procedure Notes (Signed)
Procedure Name: LMA Insertion Date/Time: 07/13/2019 9:19 AM Performed by: Willa Frater, CRNA Pre-anesthesia Checklist: Patient identified, Emergency Drugs available, Suction available and Patient being monitored Patient Re-evaluated:Patient Re-evaluated prior to induction Oxygen Delivery Method: Circle system utilized Preoxygenation: Pre-oxygenation with 100% oxygen Induction Type: IV induction Ventilation: Mask ventilation without difficulty LMA: LMA inserted LMA Size: 4.0 Number of attempts: 1 Airway Equipment and Method: Bite block Placement Confirmation: positive ETCO2 Tube secured with: Tape Dental Injury: Teeth and Oropharynx as per pre-operative assessment

## 2019-07-17 ENCOUNTER — Encounter: Payer: Self-pay | Admitting: *Deleted

## 2019-07-17 LAB — SURGICAL PATHOLOGY

## 2019-07-31 DIAGNOSIS — N762 Acute vulvitis: Secondary | ICD-10-CM | POA: Diagnosis not present

## 2019-07-31 DIAGNOSIS — L0390 Cellulitis, unspecified: Secondary | ICD-10-CM | POA: Diagnosis not present

## 2019-08-02 DIAGNOSIS — N762 Acute vulvitis: Secondary | ICD-10-CM | POA: Diagnosis not present

## 2019-08-07 DIAGNOSIS — F3341 Major depressive disorder, recurrent, in partial remission: Secondary | ICD-10-CM | POA: Diagnosis not present

## 2019-08-08 DIAGNOSIS — B372 Candidiasis of skin and nail: Secondary | ICD-10-CM | POA: Diagnosis not present

## 2019-08-08 DIAGNOSIS — N762 Acute vulvitis: Secondary | ICD-10-CM | POA: Diagnosis not present

## 2019-08-24 DIAGNOSIS — C210 Malignant neoplasm of anus, unspecified: Secondary | ICD-10-CM | POA: Diagnosis not present

## 2019-08-28 DIAGNOSIS — E119 Type 2 diabetes mellitus without complications: Secondary | ICD-10-CM | POA: Diagnosis not present

## 2019-08-28 DIAGNOSIS — Z0000 Encounter for general adult medical examination without abnormal findings: Secondary | ICD-10-CM | POA: Diagnosis not present

## 2019-08-28 DIAGNOSIS — E7849 Other hyperlipidemia: Secondary | ICD-10-CM | POA: Diagnosis not present

## 2019-08-29 ENCOUNTER — Other Ambulatory Visit: Payer: Self-pay

## 2019-08-30 ENCOUNTER — Other Ambulatory Visit: Payer: Self-pay | Admitting: Surgery

## 2019-08-30 DIAGNOSIS — C210 Malignant neoplasm of anus, unspecified: Secondary | ICD-10-CM

## 2019-09-04 ENCOUNTER — Ambulatory Visit
Admission: RE | Admit: 2019-09-04 | Discharge: 2019-09-04 | Disposition: A | Discharge status: Home / Self Care | Payer: BC Managed Care – PPO | Source: Ambulatory Visit | Attending: Surgery | Admitting: Surgery

## 2019-09-04 DIAGNOSIS — C210 Malignant neoplasm of anus, unspecified: Secondary | ICD-10-CM

## 2019-09-04 DIAGNOSIS — C44520 Squamous cell carcinoma of anal skin: Secondary | ICD-10-CM | POA: Diagnosis not present

## 2019-09-04 MED ORDER — IOPAMIDOL (ISOVUE-300) INJECTION 61%
30.0000 mL | Freq: Once | INTRAVENOUS | Status: AC | PRN
  Administered 2019-09-04: 30 mL via INTRAVENOUS

## 2019-09-04 MED ORDER — IOPAMIDOL (ISOVUE-300) INJECTION 61%
100.0000 mL | Freq: Once | INTRAVENOUS | Status: AC | PRN
  Administered 2019-09-04: 100 mL via INTRAVENOUS

## 2019-09-12 DIAGNOSIS — Z1389 Encounter for screening for other disorder: Secondary | ICD-10-CM | POA: Diagnosis not present

## 2019-09-12 DIAGNOSIS — H6691 Otitis media, unspecified, right ear: Secondary | ICD-10-CM | POA: Diagnosis not present

## 2019-09-12 DIAGNOSIS — E7849 Other hyperlipidemia: Secondary | ICD-10-CM | POA: Diagnosis not present

## 2019-09-12 DIAGNOSIS — D649 Anemia, unspecified: Secondary | ICD-10-CM | POA: Diagnosis not present

## 2019-09-12 DIAGNOSIS — R413 Other amnesia: Secondary | ICD-10-CM | POA: Diagnosis not present

## 2019-09-12 DIAGNOSIS — Z0000 Encounter for general adult medical examination without abnormal findings: Secondary | ICD-10-CM | POA: Diagnosis not present

## 2019-09-12 DIAGNOSIS — E119 Type 2 diabetes mellitus without complications: Secondary | ICD-10-CM | POA: Diagnosis not present

## 2019-09-12 DIAGNOSIS — R82998 Other abnormal findings in urine: Secondary | ICD-10-CM | POA: Diagnosis not present

## 2019-09-12 DIAGNOSIS — R599 Enlarged lymph nodes, unspecified: Secondary | ICD-10-CM | POA: Diagnosis not present

## 2019-09-14 DIAGNOSIS — Z1212 Encounter for screening for malignant neoplasm of rectum: Secondary | ICD-10-CM | POA: Diagnosis not present

## 2019-10-10 DIAGNOSIS — N762 Acute vulvitis: Secondary | ICD-10-CM | POA: Diagnosis not present

## 2019-11-01 DIAGNOSIS — L0231 Cutaneous abscess of buttock: Secondary | ICD-10-CM | POA: Diagnosis not present

## 2019-11-16 DIAGNOSIS — L0231 Cutaneous abscess of buttock: Secondary | ICD-10-CM | POA: Diagnosis not present

## 2019-11-20 DIAGNOSIS — C210 Malignant neoplasm of anus, unspecified: Secondary | ICD-10-CM | POA: Diagnosis not present

## 2019-12-05 DIAGNOSIS — R946 Abnormal results of thyroid function studies: Secondary | ICD-10-CM | POA: Diagnosis not present

## 2019-12-05 DIAGNOSIS — D649 Anemia, unspecified: Secondary | ICD-10-CM | POA: Diagnosis not present

## 2019-12-12 ENCOUNTER — Other Ambulatory Visit (HOSPITAL_COMMUNITY): Payer: Self-pay

## 2019-12-13 ENCOUNTER — Other Ambulatory Visit: Payer: Self-pay

## 2019-12-13 ENCOUNTER — Encounter (HOSPITAL_COMMUNITY)
Admission: RE | Admit: 2019-12-13 | Discharge: 2019-12-13 | Disposition: A | Discharge status: Home / Self Care | Payer: BC Managed Care – PPO | Source: Ambulatory Visit | Attending: Internal Medicine | Admitting: Internal Medicine

## 2019-12-13 DIAGNOSIS — D649 Anemia, unspecified: Secondary | ICD-10-CM | POA: Diagnosis not present

## 2019-12-13 MED ORDER — SODIUM CHLORIDE 0.9 % IV SOLN
510.0000 mg | INTRAVENOUS | Status: DC
  Administered 2019-12-13: 510 mg via INTRAVENOUS
  Filled 2019-12-13: qty 17

## 2019-12-20 ENCOUNTER — Ambulatory Visit (HOSPITAL_COMMUNITY)
Admission: RE | Admit: 2019-12-20 | Discharge: 2019-12-20 | Disposition: A | Discharge status: Home / Self Care | Payer: BC Managed Care – PPO | Source: Ambulatory Visit | Attending: Internal Medicine | Admitting: Internal Medicine

## 2019-12-20 ENCOUNTER — Other Ambulatory Visit: Payer: Self-pay

## 2019-12-20 DIAGNOSIS — D649 Anemia, unspecified: Secondary | ICD-10-CM | POA: Insufficient documentation

## 2019-12-20 MED ORDER — SODIUM CHLORIDE 0.9 % IV SOLN
510.0000 mg | INTRAVENOUS | Status: AC
  Administered 2019-12-20: 510 mg via INTRAVENOUS
  Filled 2019-12-20: qty 17

## 2019-12-20 NOTE — Discharge Instructions (Signed)
  Epoetin Alfa injection What is this medicine? EPOETIN ALFA (e POE e tin AL fa) helps your body make more red blood cells. This medicine is used to treat anemia caused by chronic kidney disease, cancer chemotherapy, or HIV-therapy. It may also be used before surgery if you have anemia. This medicine may be used for other purposes; ask your health care provider or pharmacist if you have questions. COMMON BRAND NAME(S): Epogen, Procrit, Retacrit What should I tell my health care provider before I take this medicine? They need to know if you have any of these conditions:  cancer  heart disease  high blood pressure  history of blood clots  history of stroke  low levels of folate, iron, or vitamin B12 in the blood  seizures  an unusual or allergic reaction to erythropoietin, albumin, benzyl alcohol, hamster proteins, other medicines, foods, dyes, or preservatives  pregnant or trying to get pregnant  breast-feeding How should I use this medicine? This medicine is for injection into a vein or under the skin. It is usually given by a health care professional in a hospital or clinic setting. If you get this medicine at home, you will be taught how to prepare and give this medicine. Use exactly as directed. Take your medicine at regular intervals. Do not take your medicine more often than directed. It is important that you put your used needles and syringes in a special sharps container. Do not put them in a trash can. If you do not have a sharps container, call your pharmacist or healthcare provider to get one. A special MedGuide will be given to you by the pharmacist with each prescription and refill. Be sure to read this information carefully each time. Talk to your pediatrician regarding the use of this medicine in children. While this drug may be prescribed for selected conditions, precautions do apply. Overdosage: If you think you have taken too much of this medicine contact a  poison control center or emergency room at once. NOTE: This medicine is only for you. Do not share this medicine with others. What if I miss a dose? If you miss a dose, take it as soon as you can. If it is almost time for your next dose, take only that dose. Do not take double or extra doses. What may interact with this medicine? Interactions have not been studied. This list may not describe all possible interactions. Give your health care provider a list of all the medicines, herbs, non-prescription drugs, or dietary supplements you use. Also tell them if you smoke, drink alcohol, or use illegal drugs. Some items may interact with your medicine. What should I watch for while using this medicine? Your condition will be monitored carefully while you are receiving this medicine. You may need blood work done while you are taking this medicine. This medicine may cause a decrease in vitamin B6. You should make sure that you get enough vitamin B6 while you are taking this medicine. Discuss the foods you eat and the vitamins you take with your health care professional. What side effects may I notice from receiving this medicine? Side effects that you should report to your doctor or health care professional as soon as possible:  allergic reactions like skin rash, itching or hives, swelling of the face, lips, or tongue  seizures  signs and symptoms of a blood clot such as breathing problems; changes in vision; chest pain; severe, sudden headache; pain, swelling, warmth in the leg; trouble speaking; sudden   numbness or weakness of the face, arm or leg  signs and symptoms of a stroke like changes in vision; confusion; trouble speaking or understanding; severe headaches; sudden numbness or weakness of the face, arm or leg; trouble walking; dizziness; loss of balance or coordination Side effects that usually do not require medical attention (report to your doctor or health care professional if they continue  or are bothersome):  chills  cough  dizziness  fever  headaches  joint pain  muscle cramps  muscle pain  nausea, vomiting  pain, redness, or irritation at site where injected This list may not describe all possible side effects. Call your doctor for medical advice about side effects. You may report side effects to FDA at 1-800-FDA-1088. Where should I keep my medicine? Keep out of the reach of children. Store in a refrigerator between 2 and 8 degrees C (36 and 46 degrees F). Do not freeze or shake. Throw away any unused portion if using a single-dose vial. Multi-dose vials can be kept in the refrigerator for up to 21 days after the initial dose. Throw away unused medicine. NOTE: This sheet is a summary. It may not cover all possible information. If you have questions about this medicine, talk to your doctor, pharmacist, or health care provider.  2020 Elsevier/Gold Standard (2016-12-31 08:35:19) Ferumoxytol injection What is this medicine? FERUMOXYTOL is an iron complex. Iron is used to make healthy red blood cells, which carry oxygen and nutrients throughout the body. This medicine is used to treat iron deficiency anemia. This medicine may be used for other purposes; ask your health care provider or pharmacist if you have questions. COMMON BRAND NAME(S): Feraheme What should I tell my health care provider before I take this medicine? They need to know if you have any of these conditions:  anemia not caused by low iron levels  high levels of iron in the blood  magnetic resonance imaging (MRI) test scheduled  an unusual or allergic reaction to iron, other medicines, foods, dyes, or preservatives  pregnant or trying to get pregnant  breast-feeding How should I use this medicine? This medicine is for injection into a vein. It is given by a health care professional in a hospital or clinic setting. Talk to your pediatrician regarding the use of this medicine in children.  Special care may be needed. Overdosage: If you think you have taken too much of this medicine contact a poison control center or emergency room at once. NOTE: This medicine is only for you. Do not share this medicine with others. What if I miss a dose? It is important not to miss your dose. Call your doctor or health care professional if you are unable to keep an appointment. What may interact with this medicine? This medicine may interact with the following medications:  other iron products This list may not describe all possible interactions. Give your health care provider a list of all the medicines, herbs, non-prescription drugs, or dietary supplements you use. Also tell them if you smoke, drink alcohol, or use illegal drugs. Some items may interact with your medicine. What should I watch for while using this medicine? Visit your doctor or healthcare professional regularly. Tell your doctor or healthcare professional if your symptoms do not start to get better or if they get worse. You may need blood work done while you are taking this medicine. You may need to follow a special diet. Talk to your doctor. Foods that contain iron include: whole grains/cereals, dried fruits,   beans, or peas, leafy green vegetables, and organ meats (liver, kidney). What side effects may I notice from receiving this medicine? Side effects that you should report to your doctor or health care professional as soon as possible:  allergic reactions like skin rash, itching or hives, swelling of the face, lips, or tongue  breathing problems  changes in blood pressure  feeling faint or lightheaded, falls  fever or chills  flushing, sweating, or hot feelings  swelling of the ankles or feet Side effects that usually do not require medical attention (report to your doctor or health care professional if they continue or are bothersome):  diarrhea  headache  nausea, vomiting  stomach pain This list may not  describe all possible side effects. Call your doctor for medical advice about side effects. You may report side effects to FDA at 1-800-FDA-1088. Where should I keep my medicine? This drug is given in a hospital or clinic and will not be stored at home. NOTE: This sheet is a summary. It may not cover all possible information. If you have questions about this medicine, talk to your doctor, pharmacist, or health care provider.  2020 Elsevier/Gold Standard (2016-07-12 20:21:10)  

## 2019-12-31 DIAGNOSIS — R300 Dysuria: Secondary | ICD-10-CM | POA: Diagnosis not present

## 2019-12-31 DIAGNOSIS — N390 Urinary tract infection, site not specified: Secondary | ICD-10-CM | POA: Diagnosis not present

## 2020-01-09 ENCOUNTER — Other Ambulatory Visit (HOSPITAL_COMMUNITY)
Admission: RE | Admit: 2020-01-09 | Discharge: 2020-01-09 | Disposition: A | Discharge status: Home / Self Care | Payer: BC Managed Care – PPO | Source: Ambulatory Visit | Attending: Surgery | Admitting: Surgery

## 2020-01-09 ENCOUNTER — Other Ambulatory Visit (HOSPITAL_COMMUNITY): Payer: BC Managed Care – PPO

## 2020-01-09 ENCOUNTER — Ambulatory Visit: Payer: Self-pay | Admitting: Surgery

## 2020-01-09 DIAGNOSIS — C210 Malignant neoplasm of anus, unspecified: Secondary | ICD-10-CM | POA: Diagnosis not present

## 2020-01-09 DIAGNOSIS — Z20822 Contact with and (suspected) exposure to covid-19: Secondary | ICD-10-CM | POA: Diagnosis not present

## 2020-01-09 DIAGNOSIS — Z01812 Encounter for preprocedural laboratory examination: Secondary | ICD-10-CM | POA: Diagnosis not present

## 2020-01-09 LAB — SARS CORONAVIRUS 2 (TAT 6-24 HRS): SARS Coronavirus 2: NEGATIVE

## 2020-01-09 NOTE — Patient Instructions (Addendum)
DUE TO COVID-19 ONLY ONE VISITOR IS ALLOWED TO COME WITH YOU AND STAY IN THE WAITING ROOM ONLY DURING PRE OP AND PROCEDURE DAY OF SURGERY. THE 2 VISITORS MAY VISIT WITH YOU AFTER SURGERY IN YOUR PRIVATE ROOM DURING VISITING HOURS ONLY!  ONCE YOUR COVID TEST IS COMPLETED,  PLEASE BEGIN THE QUARANTINE INSTRUCTIONS AS OUTLINED IN YOUR HANDOUT.                Melinda [name]    Your procedure is scheduled on: 01/11/20   Report to [facility] Main  Entrance   Report to admitting at  12:00 pm      Call this number if you have problems the morning of surgery [phone]    Remember: Do not eat food after 5:00 pm .  You may have clear liquids until 8:00 AM    CLEAR LIQUID DIET   Foods Allowed                                                                     Foods Excluded  Coffee and tea, regular and decaf                             liquids that you cannot  Plain Jell-O any favor except red or purple                                           see through such as: Fruit ices (not with fruit pulp)                                     milk, soups, orange juice  Iced Popsicles                                    All solid food Carbonated beverages, regular and diet                                    Cranberry, grape and apple juices Sports drinks like Gatorade Lightly seasoned clear broth or consume(fat free) Sugar, honey syrup      BRUSH YOUR TEETH MORNING OF SURGERY AND RINSE YOUR MOUTH OUT, NO CHEWING GUM CANDY OR MINTS.     Take these medicines the morning of surgery with A SIP OF WATER: Clorazepate, Prozac, Propranolol, Nexium, Methenamine                                 You may not have any metal on your body including hair pins and              piercings  Do not wear jewelry, make-up, lotions, powders or perfumes, deodorant             Do not wear nail polish on your fingernails.  Do not shave  48 hours prior to surgery.     Do not bring valuables to the  hospital. [place].  Contacts, dentures or bridgework may not be worn into surgery.      Patients discharged the day of surgery will not be allowed to drive home  . IF YOU ARE HAVING SURGERY AND GOING HOME THE SAME DAY, YOU MUST HAVE AN ADULT TO DRIVE YOU HOME AND BE WITH YOU FOR 24 HOURS.   YOU MAY GO HOME BY TAXI OR UBER OR ORTHERWISE, BUT AN ADULT MUST ACCOMPANY YOU HOME AND STAY WITH YOU FOR 24 HOURS.  Name and phone number of your driver:  Special Instructions: N/A              Please read over the following fact sheets you were given:  _____________________________________________________________________   [facility] - Preparing for Surgery  Before surgery, you can play an important role.   Because skin is not sterile, your skin needs to be as free of germs as possible.   You can reduce the number of germs on your skin by washing with CHG (chlorahexidine gluconate) soap before surgery .  CHG is an antiseptic cleaner which kills germs and bonds with the skin to continue killing germs even after washing. Please DO NOT use if you have an allergy to CHG or antibacterial soaps.   If your skin becomes reddened/irritated stop using the CHG and inform your nurse when you arrive at Short Stay. Do not shave (including legs and underarms) for at least 48 hours prior to the first CHG shower.    Please follow these instructions carefully:  1.  Shower with CHG Soap the night before surgery and the  morning of Surgery.  2.  If you choose to wash your hair, wash your hair first as usual with your  normal  shampoo.  3.  After you shampoo, rinse your hair and body thoroughly to remove the  shampoo.                                        4.  Use CHG as you would any other liquid soap.  You can apply chg directly  to the skin and wash                       Gently with a scrungie or clean washcloth.  5.  Apply the CHG Soap to your body ONLY FROM  THE NECK DOWN.   Do not use on face/ open                           Wound or open sores. Avoid contact with eyes, ears mouth and genitals (private parts).                       Wash face,  Genitals (private parts) with your normal soap.             6.  Wash thoroughly, paying special attention to the area where your surgery  will be performed.  7.  Thoroughly rinse your body with warm water from the neck down.  8.  DO NOT shower/wash with your normal soap after using  and rinsing off  the CHG Soap.             9.  Pat yourself dry with a clean towel.            10.  Wear clean pajamas.            11.  Place clean sheets on your bed the night of your first shower and do not  sleep with pets. Day of Surgery : Do not apply any lotions/deodorants the morning of surgery.  Please wear clean clothes to the hospital/surgery center.  FAILURE TO FOLLOW THESE INSTRUCTIONS MAY RESULT IN THE CANCELLATION OF YOUR SURGERY PATIENT SIGNATURE_________________________________  NURSE SIGNATURE__________________________________  ________________________________________________________________________

## 2020-01-10 ENCOUNTER — Other Ambulatory Visit: Payer: Self-pay

## 2020-01-10 ENCOUNTER — Encounter (HOSPITAL_COMMUNITY)
Admission: RE | Admit: 2020-01-10 | Discharge: 2020-01-10 | Disposition: A | Discharge status: Home / Self Care | Payer: BC Managed Care – PPO | Source: Ambulatory Visit | Attending: Surgery | Admitting: Surgery

## 2020-01-10 DIAGNOSIS — N3000 Acute cystitis without hematuria: Secondary | ICD-10-CM | POA: Diagnosis not present

## 2020-01-10 DIAGNOSIS — R300 Dysuria: Secondary | ICD-10-CM | POA: Diagnosis not present

## 2020-01-10 DIAGNOSIS — Z01812 Encounter for preprocedural laboratory examination: Secondary | ICD-10-CM | POA: Diagnosis not present

## 2020-01-10 LAB — CBC WITH DIFFERENTIAL/PLATELET
Abs Immature Granulocytes: 0.02 10*3/uL (ref 0.00–0.07)
Basophils Absolute: 0.1 10*3/uL (ref 0.0–0.1)
Basophils Relative: 2 %
Eosinophils Absolute: 0.1 10*3/uL (ref 0.0–0.5)
Eosinophils Relative: 3 %
HCT: 39.2 % (ref 36.0–46.0)
Hemoglobin: 11.7 g/dL — ABNORMAL LOW (ref 12.0–15.0)
Immature Granulocytes: 1 %
Lymphocytes Relative: 44 %
Lymphs Abs: 1.9 10*3/uL (ref 0.7–4.0)
MCH: 24.4 pg — ABNORMAL LOW (ref 26.0–34.0)
MCHC: 29.8 g/dL — ABNORMAL LOW (ref 30.0–36.0)
MCV: 81.8 fL (ref 80.0–100.0)
Monocytes Absolute: 0.5 10*3/uL (ref 0.1–1.0)
Monocytes Relative: 11 %
Neutro Abs: 1.7 10*3/uL (ref 1.7–7.7)
Neutrophils Relative %: 39 %
Platelets: 248 10*3/uL (ref 150–400)
RBC: 4.79 MIL/uL (ref 3.87–5.11)
RDW: 23.3 % — ABNORMAL HIGH (ref 11.5–15.5)
WBC: 4.3 10*3/uL (ref 4.0–10.5)
nRBC: 0 % (ref 0.0–0.2)

## 2020-01-10 LAB — BASIC METABOLIC PANEL
Anion gap: 9 (ref 5–15)
BUN: 21 mg/dL (ref 8–23)
CO2: 27 mmol/L (ref 22–32)
Calcium: 9.4 mg/dL (ref 8.9–10.3)
Chloride: 104 mmol/L (ref 98–111)
Creatinine, Ser: 0.84 mg/dL (ref 0.44–1.00)
GFR calc Af Amer: >60 mL/min (ref >60)
GFR calc non Af Amer: >60 mL/min (ref >60)
Glucose, Bld: 114 mg/dL — ABNORMAL HIGH (ref 70–99)
Potassium: 4.2 mmol/L (ref 3.5–5.1)
Sodium: 140 mmol/L (ref 135–145)

## 2020-01-10 LAB — HEMOGLOBIN A1C
Hgb A1c MFr Bld: 5.2 % (ref 4.8–5.6)
Mean Plasma Glucose: 102.54 mg/dL

## 2020-01-10 MED ORDER — BUPIVACAINE LIPOSOME 1.3 % IJ SUSP
20.0000 mL | Freq: Once | INTRAMUSCULAR | Status: DC
  Filled 2020-01-10: qty 20

## 2020-01-10 NOTE — Anesthesia Preprocedure Evaluation (Addendum)
Anesthesia Evaluation  Patient identified by MRN, date of birth, ID band Patient awake    Reviewed: Allergy & Precautions, NPO status , Patient's Chart, lab work & pertinent test results  History of Anesthesia Complications Negative for: history of anesthetic complications  Airway Mallampati: II  TM Distance: >3 FB Neck ROM: Full    Dental no notable dental hx.    Pulmonary former smoker,    Pulmonary exam normal        Cardiovascular hypertension, Normal cardiovascular exam     Neuro/Psych negative neurological ROS  negative psych ROS   GI/Hepatic Neg liver ROS, GERD  ,  Endo/Other  diabetes, Type 2, Oral Hypoglycemic Agents  Renal/GU negative Renal ROS  negative genitourinary   Musculoskeletal negative musculoskeletal ROS (+)   Abdominal   Peds  Hematology Hgb 11.7   Anesthesia Other Findings Day of surgery medications reviewed with patient.  Reproductive/Obstetrics negative OB ROS                            Anesthesia Physical Anesthesia Plan  ASA: II  Anesthesia Plan: General   Post-op Pain Management:    Induction: Intravenous  PONV Risk Score and Plan: 3 and Treatment may vary due to age or medical condition, Ondansetron and Midazolam  Airway Management Planned: LMA  Additional Equipment: None  Intra-op Plan:   Post-operative Plan: Extubation in OR  Informed Consent: I have reviewed the patients History and Physical, chart, labs and discussed the procedure including the risks, benefits and alternatives for the proposed anesthesia with the patient or authorized representative who has indicated his/her understanding and acceptance.     Dental advisory given  Plan Discussed with: CRNA  Anesthesia Plan Comments:        Anesthesia Quick Evaluation

## 2020-01-11 ENCOUNTER — Ambulatory Visit (HOSPITAL_COMMUNITY)
Admission: RE | Admit: 2020-01-11 | Discharge: 2020-01-11 | Disposition: A | Discharge status: Home / Self Care | Payer: BC Managed Care – PPO | Source: Ambulatory Visit | Attending: Surgery | Admitting: Surgery

## 2020-01-11 ENCOUNTER — Ambulatory Visit (HOSPITAL_COMMUNITY): Payer: BC Managed Care – PPO | Admitting: Anesthesiology

## 2020-01-11 ENCOUNTER — Encounter (HOSPITAL_COMMUNITY): Payer: Self-pay | Admitting: Surgery

## 2020-01-11 ENCOUNTER — Encounter (HOSPITAL_COMMUNITY)
Admission: RE | Disposition: A | Discharge status: Home / Self Care | Payer: Self-pay | Source: Ambulatory Visit | Attending: Surgery

## 2020-01-11 DIAGNOSIS — Z85048 Personal history of other malignant neoplasm of rectum, rectosigmoid junction, and anus: Secondary | ICD-10-CM | POA: Diagnosis not present

## 2020-01-11 DIAGNOSIS — E785 Hyperlipidemia, unspecified: Secondary | ICD-10-CM | POA: Insufficient documentation

## 2020-01-11 DIAGNOSIS — E119 Type 2 diabetes mellitus without complications: Secondary | ICD-10-CM | POA: Diagnosis not present

## 2020-01-11 DIAGNOSIS — I10 Essential (primary) hypertension: Secondary | ICD-10-CM | POA: Insufficient documentation

## 2020-01-11 DIAGNOSIS — K6289 Other specified diseases of anus and rectum: Secondary | ICD-10-CM | POA: Diagnosis not present

## 2020-01-11 DIAGNOSIS — Z79899 Other long term (current) drug therapy: Secondary | ICD-10-CM | POA: Insufficient documentation

## 2020-01-11 DIAGNOSIS — K648 Other hemorrhoids: Secondary | ICD-10-CM | POA: Insufficient documentation

## 2020-01-11 DIAGNOSIS — C44520 Squamous cell carcinoma of anal skin: Secondary | ICD-10-CM | POA: Diagnosis not present

## 2020-01-11 DIAGNOSIS — Z87891 Personal history of nicotine dependence: Secondary | ICD-10-CM | POA: Diagnosis not present

## 2020-01-11 DIAGNOSIS — K219 Gastro-esophageal reflux disease without esophagitis: Secondary | ICD-10-CM | POA: Insufficient documentation

## 2020-01-11 DIAGNOSIS — Z7984 Long term (current) use of oral hypoglycemic drugs: Secondary | ICD-10-CM | POA: Insufficient documentation

## 2020-01-11 DIAGNOSIS — C211 Malignant neoplasm of anal canal: Secondary | ICD-10-CM | POA: Insufficient documentation

## 2020-01-11 DIAGNOSIS — C210 Malignant neoplasm of anus, unspecified: Secondary | ICD-10-CM | POA: Diagnosis not present

## 2020-01-11 HISTORY — PX: CYST EXCISION: SHX5701

## 2020-01-11 HISTORY — PX: RECTAL EXAM UNDER ANESTHESIA: SHX6399

## 2020-01-11 LAB — GLUCOSE, CAPILLARY
Glucose-Capillary: 128 mg/dL — ABNORMAL HIGH (ref 70–99)
Glucose-Capillary: 119 mg/dL — ABNORMAL HIGH (ref 70–99)

## 2020-01-11 SURGERY — EXAM UNDER ANESTHESIA, RECTUM
Anesthesia: General

## 2020-01-11 MED ORDER — ACETAMINOPHEN 500 MG PO TABS: 1000.0000 mg | ORAL_TABLET | ORAL | Status: DC

## 2020-01-11 MED ORDER — FENTANYL CITRATE (PF) 100 MCG/2ML IJ SOLN: 25.0000 ug | INTRAMUSCULAR | Status: DC | PRN

## 2020-01-11 MED ORDER — MIDAZOLAM HCL 2 MG/2ML IJ SOLN
INTRAMUSCULAR | Status: DC | PRN
  Administered 2020-01-11: 2 mg via INTRAVENOUS

## 2020-01-11 MED ORDER — OXYCODONE HCL 5 MG PO TABS: 5.0000 mg | ORAL_TABLET | Freq: Once | ORAL | Status: DC | PRN

## 2020-01-11 MED ORDER — BUPIVACAINE-EPINEPHRINE (PF) 0.25% -1:200000 IJ SOLN
INTRAMUSCULAR | Status: DC | PRN
  Administered 2020-01-11: 30 mL

## 2020-01-11 MED ORDER — ORAL CARE MOUTH RINSE: 15.0000 mL | Freq: Once | OROMUCOSAL | Status: AC

## 2020-01-11 MED ORDER — ONDANSETRON HCL 4 MG/2ML IJ SOLN
INTRAMUSCULAR | Status: DC | PRN
  Administered 2020-01-11: 4 mg via INTRAVENOUS

## 2020-01-11 MED ORDER — ACETAMINOPHEN 500 MG PO TABS
1000.0000 mg | ORAL_TABLET | Freq: Once | ORAL | Status: AC
  Administered 2020-01-11: 1000 mg via ORAL

## 2020-01-11 MED ORDER — DIBUCAINE (PERIANAL) 1 % EX OINT
TOPICAL_OINTMENT | CUTANEOUS | Status: DC | PRN
  Administered 2020-01-11: 1 via RECTAL

## 2020-01-11 MED ORDER — DROPERIDOL 2.5 MG/ML IJ SOLN: 0.6250 mg | Freq: Once | INTRAMUSCULAR | Status: DC | PRN

## 2020-01-11 MED ORDER — CHLORHEXIDINE GLUCONATE CLOTH 2 % EX PADS: 6.0000 | MEDICATED_PAD | Freq: Once | CUTANEOUS | Status: DC

## 2020-01-11 MED ORDER — CHLORHEXIDINE GLUCONATE 0.12 % MT SOLN
15.0000 mL | Freq: Once | OROMUCOSAL | Status: AC
  Administered 2020-01-11: 15 mL via OROMUCOSAL

## 2020-01-11 MED ORDER — PROPOFOL 10 MG/ML IV BOLUS
INTRAVENOUS | Status: DC | PRN
  Administered 2020-01-11: 140 mg via INTRAVENOUS

## 2020-01-11 MED ORDER — EPHEDRINE SULFATE-NACL 50-0.9 MG/10ML-% IV SOSY
PREFILLED_SYRINGE | INTRAVENOUS | Status: DC | PRN
  Administered 2020-01-11: 10 mg via INTRAVENOUS

## 2020-01-11 MED ORDER — FENTANYL CITRATE (PF) 100 MCG/2ML IJ SOLN
INTRAMUSCULAR | Status: DC | PRN
  Administered 2020-01-11: 100 ug via INTRAVENOUS

## 2020-01-11 MED ORDER — FENTANYL CITRATE (PF) 100 MCG/2ML IJ SOLN
INTRAMUSCULAR | Status: AC
  Filled 2020-01-11: qty 2

## 2020-01-11 MED ORDER — BUPIVACAINE-EPINEPHRINE (PF) 0.25% -1:200000 IJ SOLN
INTRAMUSCULAR | Status: AC
  Filled 2020-01-11: qty 30

## 2020-01-11 MED ORDER — EPHEDRINE 5 MG/ML INJ
INTRAVENOUS | Status: AC
  Filled 2020-01-11: qty 10

## 2020-01-11 MED ORDER — LACTATED RINGERS IV SOLN: INTRAVENOUS | Status: DC

## 2020-01-11 MED ORDER — LIDOCAINE 2% (20 MG/ML) 5 ML SYRINGE
INTRAMUSCULAR | Status: DC | PRN
  Administered 2020-01-11: 80 mg via INTRAVENOUS

## 2020-01-11 MED ORDER — DIBUCAINE (PERIANAL) 1 % EX OINT
TOPICAL_OINTMENT | CUTANEOUS | Status: AC
  Filled 2020-01-11: qty 28

## 2020-01-11 MED ORDER — MIDAZOLAM HCL 2 MG/2ML IJ SOLN
INTRAMUSCULAR | Status: AC
  Filled 2020-01-11: qty 2

## 2020-01-11 MED ORDER — PROPOFOL 10 MG/ML IV BOLUS
INTRAVENOUS | Status: AC
  Filled 2020-01-11: qty 20

## 2020-01-11 MED ORDER — OXYCODONE HCL 5 MG/5ML PO SOLN: 5.0000 mg | Freq: Once | ORAL | Status: DC | PRN

## 2020-01-11 MED ORDER — TRAMADOL HCL 50 MG PO TABS: 50.0000 mg | ORAL_TABLET | Freq: Four times a day (QID) | ORAL | 0 refills | Status: AC | PRN

## 2020-01-11 MED ORDER — ONDANSETRON HCL 4 MG/2ML IJ SOLN
INTRAMUSCULAR | Status: AC
  Filled 2020-01-11: qty 2

## 2020-01-11 MED ORDER — BUPIVACAINE LIPOSOME 1.3 % IJ SUSP
INTRAMUSCULAR | Status: DC | PRN
  Administered 2020-01-11: 20 mL

## 2020-01-11 MED ORDER — LIDOCAINE 2% (20 MG/ML) 5 ML SYRINGE
INTRAMUSCULAR | Status: AC
  Filled 2020-01-11: qty 5

## 2020-01-11 SURGICAL SUPPLY — 39 items
BENZOIN TINCTURE PRP APPL 2/3 (GAUZE/BANDAGES/DRESSINGS) ×3 IMPLANT
BLADE SURG 15 STRL LF DISP TIS (BLADE) IMPLANT
BLADE SURG 15 STRL SS (BLADE)
BRIEF STRETCH FOR OB PAD LRG (UNDERPADS AND DIAPERS) ×3 IMPLANT
CNTNR URN SCR LID CUP LEK RST (MISCELLANEOUS) ×1 IMPLANT
CONT SPEC 4OZ STRL OR WHT (MISCELLANEOUS) ×3
COVER BACK TABLE 60X90IN (DRAPES) ×3 IMPLANT
COVER MAYO STAND STRL (DRAPES) ×3 IMPLANT
COVER SURGICAL LIGHT HANDLE (MISCELLANEOUS) ×3 IMPLANT
COVER WAND RF STERILE (DRAPES) IMPLANT
DECANTER SPIKE VIAL GLASS SM (MISCELLANEOUS) ×3 IMPLANT
DRAPE LAPAROTOMY T 102X78X121 (DRAPES) ×3 IMPLANT
DRAPE SHEET LG 3/4 BI-LAMINATE (DRAPES) ×3 IMPLANT
DRAPE UTILITY XL STRL (DRAPES) ×3 IMPLANT
DRSG PAD ABDOMINAL 8X10 ST (GAUZE/BANDAGES/DRESSINGS) ×3 IMPLANT
ELECT REM PT RETURN 15FT ADLT (MISCELLANEOUS) ×3 IMPLANT
GAUZE 4X4 16PLY RFD (DISPOSABLE) ×3 IMPLANT
GAUZE SPONGE 4X4 12PLY STRL (GAUZE/BANDAGES/DRESSINGS) ×3 IMPLANT
GLOVE BIO SURGEON STRL SZ7.5 (GLOVE) ×3 IMPLANT
GLOVE INDICATOR 8.0 STRL GRN (GLOVE) ×3 IMPLANT
GOWN STRL REUS W/TWL XL LVL3 (GOWN DISPOSABLE) ×6 IMPLANT
KIT BASIN OR (CUSTOM PROCEDURE TRAY) ×3 IMPLANT
KIT TURNOVER KIT A (KITS) IMPLANT
LOOP VESSEL MAXI BLUE (MISCELLANEOUS) IMPLANT
NEEDLE HYPO 22GX1.5 SAFETY (NEEDLE) ×3 IMPLANT
PACK BASIC VI WITH GOWN DISP (CUSTOM PROCEDURE TRAY) IMPLANT
PENCIL SMOKE EVACUATOR (MISCELLANEOUS) IMPLANT
SHEARS HARMONIC 9CM CVD (BLADE) IMPLANT
SURGILUBE 2OZ TUBE FLIPTOP (MISCELLANEOUS) ×3 IMPLANT
SUT CHROMIC 2 0 SH (SUTURE) ×3 IMPLANT
SUT CHROMIC 3 0 SH 27 (SUTURE) IMPLANT
SUT VIC AB 2-0 SH 27 (SUTURE)
SUT VIC AB 2-0 SH 27X BRD (SUTURE) IMPLANT
SUT VIC AB 2-0 UR6 27 (SUTURE) ×18 IMPLANT
SYR 20ML LL LF (SYRINGE) ×3 IMPLANT
SYR 3ML LL SCALE MARK (SYRINGE) IMPLANT
TOWEL OR 17X26 10 PK STRL BLUE (TOWEL DISPOSABLE) ×3 IMPLANT
TOWEL OR NON WOVEN STRL DISP B (DISPOSABLE) ×3 IMPLANT
YANKAUER SUCT BULB TIP 10FT TU (MISCELLANEOUS) ×3 IMPLANT

## 2020-01-11 NOTE — Discharge Instructions (Signed)
ANORECTAL SURGERY: POST OP INSTRUCTIONS ° °1. DIET: Follow a light bland diet the first 24 hours after arrival home, such as soup, liquids, crackers, etc.  Be sure to include lots of fluids daily.  Avoid fast food or heavy meals as your are more likely to get nauseated.  Eat a low fat diet the next few days after surgery.   °2. Some bleeding with bowel movements is expected for the first couple of days but this should stop in between bowel movements ° °3. Take your usually prescribed home medications unless otherwise directed. ° °4. PAIN CONTROL: °a. It is helpful to take an over-the-counter pain medication regularly for the first few days/weeks.  Choose from the following that works best for you: °i. Ibuprofen (Advil, etc) Three 200mg tabs every 6 hours as needed. °ii. Acetaminophen (Tylenol, etc) 500-650mg every 6 hours as needed °iii. NOTE: You may take both of these medications together - most patients find it most helpful when alternating between the two (i.e. Ibuprofen at 6am, tylenol at 9am, ibuprofen at 12pm ...) °b. A  prescription for pain medication may have been prescribed for you at discharge.  Take your pain medication as prescribed.  °i. If you are having problems/concerns with the prescription medicine, please call us for further advice. ° °5. Avoid getting constipated.  Between the surgery and the pain medications, it is common to experience some constipation.  Increasing fluid intake (64oz of water per day) and taking a fiber supplement (such as Metamucil, Citrucel, FiberCon) 1-2 times a day regularly will usually help prevent this problem from occurring.  Take Miralax (over the counter) 1-2x/day while taking a narcotic pain medication. If no bowel movement after 48hours, you may additionally take a laxative like a bottle of Milk of Magnesia which can be purchased over the counter. Avoid enemas if possible as these are often painful. °  °6. Watch out for diarrhea.  If you have many loose bowel  movements, simplify your diet to bland foods.  Stop any stool softeners and decrease your fiber supplement. If this worsens or does not improve, please call us. ° °7. Wash / shower every day.  If you were discharged with a dressing, you may remove this the day after your surgery. You may shower normally, getting soap/water on your wound, particularly after bowel movements. ° °8. Soaking in a warm bath filled a couple inches ("Sitz bath") is a great way to clean the area after a bowel movement and many patients find it is a way to soothe the area. ° °9. ACTIVITIES as tolerated:   °a. You may resume regular (light) daily activities beginning the next day--such as daily self-care, walking, climbing stairs--gradually increasing activities as tolerated.  If you can walk 30 minutes without difficulty, it is safe to try more intense activity such as jogging, treadmill, bicycling, low-impact aerobics, etc. °b. Refrain from any heavy lifting or straining for the first 2 weeks after your procedure, particularly if your surgery was for hemorrhoids. °c. Avoid activities that make your pain worse °d. You may drive when you are no longer taking prescription pain medication, you can comfortably wear a seatbelt, and you can safely maneuver your car and apply brakes. ° °10. FOLLOW UP in our office °a. Please call [facility] at [phone] to set up an appointment to see your surgeon in the office for a follow-up appointment approximately 2 weeks after your surgery. °b. Make sure that you call for this appointment the day you arrive   home to insure a convenient appointment time. ° °9. If you have disability or family leave forms that need to be completed, you may have them completed by your primary care physician's office; for return to work instructions, please ask our office staff and they will be happy to assist you in obtaining this documentation °  °When to call us [phone]: °1. Poor pain control °2. Reactions / problems with  new medications (rash/itching, etc)  °3. Fever over 101.5 F (38.5 C) °4. Inability to urinate °5. Nausea/vomiting °6. Worsening swelling or bruising °7. Continued bleeding from incision. °8. Increased pain, redness, or drainage from the incision ° °The clinic staff is available to answer your questions during regular business hours (8:30am-5pm).  Please don’t hesitate to call and ask to speak to one of our nurses for clinical concerns.   A surgeon from [facility] is always on call at the hospitals °  °If you have a medical emergency, go to the nearest emergency room or call 911. °  °[facility] °[address], [address], [place], [place]  [address] ? °MAIN: [phone] °FAX [phone] °[email or web address] °

## 2020-01-11 NOTE — Op Note (Addendum)
01/11/2020  1:25 PM  PATIENT:  Melinda [name]  59 y.o. female  Patient Care Team: Crist Infante, MD as PCP - General (Internal Medicine) Laurence Spates, MD (Inactive) as Attending Physician (Gastroenterology) Nobie Putnam, MD (Hematology and Oncology) Hillary Bow, MD as Attending Physician (Cardiology)  PRE-OPERATIVE DIAGNOSIS:  Anal canal mass  POST-OPERATIVE DIAGNOSIS:  Same  PROCEDURE:   1. Excision of anal canal lesion - posterior midline 1 x 1 cm 2. Anorectal exam under anesthesia  SURGEON:  Surgeon(s): Ileana Roup, MD  ANESTHESIA:   local and general  SPECIMEN:  Posterior midline anal canal lesion  DISPOSITION OF SPECIMEN:  PATHOLOGY  COUNTS:  Sponge, needle, and instrument counts were reported correct x2 at conclusion.  EBL: 5 mL  PLAN OF CARE: Discharge to home after PACU  PATIENT DISPOSITION:  PACU - hemodynamically stable.  INDICATION: Melinda [name] 59 year old female whom underwent EUA and excision of a posterior hemorrhoid by one of my partners 07/13/2019.  This was felt to represent a prolapsing hemorrhoid and was causing mucus drainage and itching.  Pathology returned invasive moderately differentiated squamous cell carcinoma with a negative deep resection margin but high-grade dysplasia at lateral margins.  They did note in the path report that the tumor invaded the "superficial anal sphincter muscle" but they commented that the margins are negative.  This was staged as a pT1Nx.  We have been following her in the office.  In the posterior midline, 2 days ago, I noted a nodule that appeared to be new.  Given her history we discussed proceeding with EUA and excision of this for further characterization. Please refer to notes elsewhere for details regarding this discussion.  OR FINDINGS: Posterior midline pea-sized nodule with some irregular appearing mucosa overlying it at the level of the dentate.  This was excised for further pathologic evaluation. This mass  measured 1 x 1 cm and was likely at the apex of the prior hemorrhoidectomy.  DESCRIPTION: The patient was identified in the preoperative holding area and taken to the OR where she was placed on the operating room table. SCDs were placed.  General LMA anesthesia was induced without difficulty. The patient was then positioned in high lithotomy with Allen stirrups. Pressure points were then evaluated and padded.  She was then prepped and draped in usual sterile fashion.  A surgical timeout was performed indicating the correct patient, procedure, and positioning.  A perianal block was performed using a dilute mixture of 0.25% Marcaine with epinephrine and Exparel.   After ascertaining that an appropriate level of anesthesia had been achieved, a well lubricated digital rectal exam was performed. This demonstrated a small palpable nodular-like mass in the posterior midline.  There were no other palpable abnormalities.  A Hill-Ferguson anoscope was into the anal canal and circumferential inspection demonstrated this lesion to be at the level of the dentate in the posterior midline.  There were no other concerning findings on circumferential anoscopy.  She does have internal hemorrhoids apparent in both the right and left posterior positions.  Attention was turned to excising this mass.  This was elevated with a DeBakey forcep and excised.  The defect was irrigated and hemostasis achieved with electrocautery.  A figure-of-eight stitch was placed at the apex using 2-0 Vicryl.  Sponge, needle, and instrument counts were reported correct x2 at conclusion.  The defect was closed with a running 2-0 Vicryl stitch.  The anus was irrigated and hemostasis was verified.  She was then taken out  of lithotomy and a dressing consisting of 4 x 4's, ABD, and mesh underwear were placed.  She was awakened from anesthesia, extubated, and transferred to a stretcher for transport to PACU in satisfactory condition.  DISPOSITION: PACU in  satisfactory condition.

## 2020-01-11 NOTE — Transfer of Care (Signed)
Immediate Anesthesia Transfer of Care Note  Patient: Melinda [name]  Procedure(s) Performed: RECTAL EXAM UNDER ANESTHESIA (N/A ) EXCISION OF ANAL CANAL MASS (N/A )  Patient Location: PACU  Anesthesia Type:General  Level of Consciousness: awake, alert  and oriented  Airway & Oxygen Therapy: Patient Spontanous Breathing and Patient connected to face mask oxygen  Post-op Assessment: Report given to RN, Post -op Vital signs reviewed and stable and Patient moving all extremities X 4  Post vital signs: Reviewed and stable  Last Vitals:  Vitals Value Taken Time  BP 160/68 01/11/20 1340  Temp    Pulse 70 01/11/20 1341  Resp    SpO2 100 % 01/11/20 1341  Vitals shown include unvalidated device data.  Last Pain:  Vitals:   01/11/20 1127  TempSrc:   PainSc: 0-No pain         Complications: No complications documented.

## 2020-01-11 NOTE — Anesthesia Procedure Notes (Signed)
Procedure Name: LMA Insertion Date/Time: 01/11/2020 12:52 PM Performed by: Niel Hummer, CRNA Pre-anesthesia Checklist: Patient identified, Emergency Drugs available, Suction available and Patient being monitored Patient Re-evaluated:Patient Re-evaluated prior to induction Oxygen Delivery Method: Circle system utilized Induction Type: IV induction LMA: LMA inserted LMA Size: 4.0 Number of attempts: 1 Dental Injury: Teeth and Oropharynx as per pre-operative assessment

## 2020-01-11 NOTE — H&P (Signed)
CC: Referred by Dr. Hassell Done for evaluation of SCC found incidentally on hemorrhoidectomy  HPI: Melinda Melinda [name] is a very pleasant 59yoF with hx HLD, GERD, DM whom underwent hemorrhoidectomy 07/13/19 by my partner, Dr. Hassell Done. This was done for a prolapsing hemorrhoid that was also causing mucus drainage and perianal itching. I reviewed the case with him. He noted internal hemorrhoids at 10 and 2:00. She had a history of banding Dr. Earlean Shawl as well. She had a prolapsing hemorrhoid in the posterior midline that had both squamous and columnar-appearing tissue on it. The Harmonic scalpel was used to excise the posterior hemorrhoid. She did well but did develop a bulbar rash after surgery that her primary doctor treated her with antibiotic course for. Pathology returned invasive moderately differentiated squamous cell carcinoma, 1.4 cm. Carcinoma invades 0.4 cm deep. Deep resection margin is negative for carcinoma (0.2 cm). Lateral mucosal margin positive for high-grade dysplasia. No evidence of lymphovascular or perineural invasion. They did note that the tumor extension invades the superficial anal sphincter muscle. But they comment that the margins are uninvolved. This was staged as a pT1Nx.  Her case was presented at our multidisciplinary tumor board. She has a strong family history of malignancy and met criteria for genetics eval as well so referral placed. She had a baseline set of CT scans done of the chest/abdomen/pelvis which demonstrated no abnormalities with regards to her anal lesion was excised. She developed a boil in the interim on her left thigh that was lanced in the office and drained. This returned with MRSA. She was treated with antibiotics which she partially completed. She denies any issues with fevers/chills. She denies any issues with ongoing drainage. The wound is basically healed. She denies any rectal bleeding, pain, or prolapsing tissue.  INTERVAL HX She returns today for  follow-up. She has had some intermittent bright red blood per rectum. She denies any perianal pain. She is not currently taking fiber self-limited states she eats a lot of fiber cereal. She denies any tissue prolapse and anal area.  The patient is a 59 year old female.   Allergies (Melinda Melinda [name], Melinda [name]; 01/09/2020 11:14 AM) Clindamycin HCl *CHEMICALS*  Swelling, Dizziness. Keflex *CEPHALOSPORINS*  Difficulty breathing. Ceclor *CEPHALOSPORINS*  Nausea, Vomiting. Tetracycline *CHEMICALS*  Codeine and Related  Hives. Antihistamines, Diphenhydramine-type  Fast Heart Rate Corticosteroids  Fast Heart Rate Adhesive Tape  Blisters Ciprofloxacin *CHEMICALS*  Erythromycin *DERMATOLOGICALS*  Flagyl *ANTI-INFECTIVE AGENTS - MISC.*  Levaquin *FLUOROQUINOLONES*  Lipitor *ANTIHYPERLIPIDEMICS*  Macrobid *Anti-infective Agents - Misc.  Macrodantin *ANTI-INFECTIVE AGENTS - MISC.*  Septra *ANTI-INFECTIVE AGENTS - MISC.*  Tricor *ANTIHYPERLIPIDEMICS*  Vibramycin *TETRACYCLINES*  Allergies Reconciled   Medication History (Melinda Melinda [name], Melinda [name]; 01/09/2020 11:14 AM) Propranolol HCl (10MG Tablet, Oral) Active. Clorazepate Dipotassium (7.5MG Tablet, Oral) Active. Methenamine Hippurate (1GM Tablet, Oral) Active. NexIUM (40MG Capsule DR, Oral) Active. Nu-Iron (150MG Capsule, Oral) Active. PROzac (20MG Capsule, Oral) Active. Rosuvastatin Calcium (5MG Tablet, Oral) Active. metFORMIN HCl ER (500MG Tablet ER 24HR, Oral) Active. Medications Reconciled    Review of Systems Melinda Melinda [name]; 01/09/2020 12:06 PM) General Not Present- Chills and Fever. Skin Not Present- Brittle Nails and Clamminess. HEENT Not Present- Blurred Vision, Double Vision and Headache. Cardiovascular Not Present- Chest Pain and Difficulty Breathing On Exertion. Gastrointestinal Not Present- Abdominal Pain, Nausea, Rectal Pain and Vomiting. Musculoskeletal Not Present- Claudication and Decreased Range of  Motion. Neurological Not Present- Decreased Memory and Difficulty Speaking. Psychiatric Not Present- Anxiety and Depression. Hematology Not Present- Abnormal Bleeding and Blood Clots.  Vitals (  Melinda Melinda [name]; 01/09/2020 11:15 AM) 01/09/2020 11:14 AM Weight: 162.5 lb Height: 66in Body Surface Area: 1.83 m Body Mass Index: 26.23 kg/m  Temp.: 98.2F  Pulse: 90 (Regular)  BP: 124/84(Sitting, Left Arm, Standard)       Physical Exam (Melinda Melinda [name]; 01/09/2020 12:11 PM) The physical exam findings are as follows: Note: Constitutional: No acute distress; conversant; wearing mask Lungs: Normal respiratory effort CV: rrr; no pitting edema Anorectal: No external perianal skin changes. DRE-somewhat decreased tone and squeeze, firm nodule vs scar posterior midline (new) Anoscopy: Circumferential anoscopy demonstrates small internal hemorrhoids, right posterior. New appearing posterior midline nodule at or below size of pea. No ulcerations. No open wounds. MSK: Normal gait; left thigh I&D site healed Psychiatric: Appropriate affect Lymphatic: No palpable inguinal lymphadenopathy **A chaperone, Melinda Melinda [name], was present for this encounter    Assessment & Plan (Melinda Melinda [name]; 01/09/2020 12:11 PM) SQUAMOUS CELL CANCER, ANUS (C21.0) Story: Melinda Melinda [name] is a very pleasant 59yoF with hx of prolapsing anal tissue, presumed hemorrhoidal and underwent posterior midline hemorrhoidectomy - pathology revealed mod diff invasive SCC with negative margins but HGD laterally; deep margin was reported negative but also reported - MDT presentation - consensus was for surveillance anoscopy moving forward CT CAP baseline 08/2019 showed no concerning findings regarding anal cancer/mets Impression: -New lesion identified - posterior midline which is most concerning since this was site of excision previously as per notes -The anatomy and physiology of the anal canal was discussed at  length with the patient. The pathophysiology of anal canal masses was discussed at length as well -Given new finding and history, we discussed proceeding with excision/biopsy of anal canal lesion, anorectal exam under anesthesia. We reviewed concerns for potential cancer. -The planned procedure, material risks (including, but not limited to, pain, bleeding, infection, scarring, need for blood transfusion, damage to anal sphincter, recurrence of lesion, need for additional tissue/surgery, pneumonia, heart attack, stroke, death) benefits and alternatives to surgery were discussed at length. The patient's questions were answered to her and her husband's satisfaction, they voiced understanding and elected to proceed with surgery. Additionally, we discussed typical postoperative expectations and the recovery process.  This patient encounter took 23 minutes today to perform the following: take history, perform exam, review outside records, interpret imaging, counsel the patient on their diagnosis and document encounter, findings & plan in the EHR Current Plans ANOSCOPY, DIAGNOSTIC (46600) (Anoscopy: Circumferential anoscopy demonstrates small internal hemorrhoids, right posterior. New appearing posterior midline nodule at or below size of pea. No ulcerations. No open wounds.) Signed by Melinda M White, Melinda [name] (01/09/2020 12:11 PM) 

## 2020-01-11 NOTE — Anesthesia Postprocedure Evaluation (Signed)
Anesthesia Post Note  Patient: Melinda [name]  Procedure(s) Performed: RECTAL EXAM UNDER ANESTHESIA (N/A ) EXCISION OF ANAL CANAL MASS (N/A )     Patient location during evaluation: PACU Anesthesia Type: General Level of consciousness: awake and alert and oriented Pain management: pain level controlled Vital Signs Assessment: post-procedure vital signs reviewed and stable Respiratory status: spontaneous breathing, nonlabored ventilation and respiratory function stable Cardiovascular status: blood pressure returned to baseline Postop Assessment: no apparent nausea or vomiting Anesthetic complications: no   No complications documented.  Last Vitals:  Vitals:   01/11/20 1430 01/11/20 1442  BP: 138/75 131/67  Pulse: 62 (!) 58  Resp: 12 18  Temp:    SpO2: 96% 92%    Last Pain:  Vitals:   01/11/20 1442  TempSrc:   PainSc: 0-No pain                 Brennan Bailey

## 2020-01-14 ENCOUNTER — Encounter (HOSPITAL_COMMUNITY): Payer: Self-pay | Admitting: Surgery

## 2020-01-14 LAB — SURGICAL PATHOLOGY

## 2020-01-15 ENCOUNTER — Telehealth: Payer: Self-pay | Admitting: Physician Assistant

## 2020-01-15 DIAGNOSIS — F3341 Major depressive disorder, recurrent, in partial remission: Secondary | ICD-10-CM | POA: Diagnosis not present

## 2020-01-15 NOTE — Progress Notes (Signed)
Melinda [name]   [phone]  CONSULT NOTE  REFERRING PHYSICIAN: Dr. Dema Severin   REASON FOR CONSULTATION:  Squamous cell carcinoma of the anus.  Oncology History  Squamous cell cancer, anus ([place])  07/13/2019 Procedure    EUA, Excision of posterior internal/external hemorrhoid under the care of Dr. Hassell Done    07/13/2019 Pathology Results   - Invasive moderately differentiated squamous cell carcinoma, 1.4 cm.  See comment  - Carcinoma invades for depth of 0.4 cm  - Deep resection margin is negative for carcinoma (0.2 cm)  - Lateral mucosal margin is positive for high-grade dysplasia  - No evidence of lymphovascular perineural invasion  Procedure: Local excision  Tumor Site: Anal canal  Tumor Size: 1.4 cm  Histologic Type: Invasive squamous cell carcinoma  Histologic Grade: G2: Moderately differentiated  Tumor Extension: Carcinoma invades superficial anal sphincter muscle  Margins: Uninvolved by tumor  Treatment Effect: N/A  Regional Lymph Nodes: No lymph nodes submitted or found  Pathologic Stage Classification (pTNM, AJCC 8th Edition):  pT1, pNx  Representative Tumor Block: A1  Comment(s): Lateral mucosal margin is positive for high-grade squamous  dysplasia      07/13/2019 Initial Diagnosis   Squamous cell cancer, anus ([place])   09/04/2019 Imaging   CT scan Chest, Abdomen, and Pelvis   IMPRESSION: 1. No evidence of metastatic disease in the chest, abdomen or pelvis. 2. No discrete anorectal mass. 3. Chronic findings include: Punctate nonobstructing upper right renal stones and chronic right renal scarring. 4. Aortic Atherosclerosis (ICD10-I70.0).   01/10/2020 Pathology Results   A. ANAL LESION, POSTERIOR MIDLINE, EXCISION:  - Squamous cell carcinoma, moderately differentiated.  Verbally reported by Dr. Saralyn Pilar 1.5 cm, negative margins, depth <1 mm   01/11/2020 Procedure   1. Excision of anal canal lesion (posterior midline)  under the care of Dr. Dema Severin        HPI Melinda [name] is a 59 y.o. female with a past medical history significant for iron deficiency anemia, GERD, anxiety, palpitations, dyslipidemia, and diabetes mellitus is referred to the clinic for squamous cell carcinoma of the anus.  She has a history of multiple bandings performed in the past under the care of Dr. Earlean Shawl. The patient's evaluation for her current illness began on July 13, 2019. The patient had a prolapsed internal hemorrhoid/external hemorrhoid with associated associated perianal itching as well as associated mucosal drainage. The drainage was causing hygenic concerns for the patient so she was seen on February 5th, 2021 by surgeon, Dr. Hassell Done, for a hemorrhoidectomy and EUA. The exam demonstrated internal hemorrhoids at the 10 oclock and 2 oclock positions. The final pathology [id number]) was consistent with invasive moderately differentiated squamous cell carcinoma, 1.4 cm.  The carcinoma invaded a depth of 0.4 cm the lateral mucosal margin was positive for high-grade dysplasia. Deep resection margin is negative for carcinoma (0.2 cm). There was no evidence of lymphovascular perineural invasion.  On September 04, 2019 the patient had a CT scan of the chest, abdomen, and pelvis to complete staging work-up.  The scan did not show any evidence of metastatic disease in the chest, abdomen, or pelvis.  There is no discrete anorectal mass noted.  The patient was then seen by surgeon, Dr. Dema Severin, in August 2021. She follows with surgery every 3 months but she was evaluated sooner because she started having new onset intermittent bright red blood per rectum.  She denied any perianal pain.  A new lesion in the posterior midline  was seen on exam in the location of the prior excision which was concerning.  Dr. Dema Severin performed a excision of the anal canal lesion on January 11, 2020.  The final pathology [id number]) was consistent with  moderately  differentiated squamous cell carcinoma. Pathology verbally reported that the size of the tumor was about 1.5 cm and margins were negative and there was reportedly no muscular involvement. The depth was less than 1 mm. She was subsequently referred to radiation oncology as well as medical oncology.  Today, the patient is feeling well except she is anxious about her current condition.  Her main concern is related to intermittent rectal bleeding with large bowel movements.  She denies any rectal pain except it occasionally feels "sore" with a large bowel movement so she often uses Colace and laxative.  She denies any perianal itching.  She denies any abdominal pain, nausea, vomiting, diarrhea, or unexplained weight loss.  She denies any history of HPV or HIV.  The patient notes that she has a long history of iron deficiency anemia which often requires IV iron infusions.  This is managed by her primary doctor Dr. Haynes Kerns.  Her last iron infusion was approximately 1 month ago and she is inquiring if she can have her IV iron infusions performed at our clinic in the future.  She was evaluated here at [facility] see back in 2014 by Dr. Lamonte Sakai. No clear etiology was found. She follows closely with [facility]. She used to see Dr. Oletta Lamas but mentions she saw another provider in that practice since Dr. Oletta Lamas has retired.   The patient's family history consists of a mother who also had iron deficiency anemia, liver disease, as well as coronary artery disease.  The patient's father had coronary artery disease, STEMIs, prostate cancer, COPD, aneurysms, and diabetes mellitus.  The patient has 3 brothers.  One of her brothers recently passed away from cancer.  The patient believes that the primary site was either lung or head/ neck cancer.  The patient's oldest brother has heart disease and hypertension and is status post CABG. The patient has another brother and sister with diabetes.  The patient's maternal grandmother had ovarian cancer.   The patient has a son who had an appendectomy in the past and reportedly had some type of cancer noted after resection that did not require any follow-up treatment.  The patient does not work.  She is married and has 1 son.  She is a former smoker having smoked on and off for approximately 30 to 40 years averaging half a pack to 1 pack/day.  She states that she quit officially 2 to 3 years ago.  She also formally used to drink alcohol but has been sober since 2012.  The patient denies any drug use.    Past Medical History:  Diagnosis Date  . Diabetes mellitus type 2, controlled ([place])   . Dyslipidemia    untreated  . GERD (gastroesophageal reflux disease)   . Hemorrhoids   . Microcytic anemia 06/22/2012  . Nephrolith   . Palpitation    positive tilt table test. Prozac and Inderal treatment effective.    Past Surgical History:  Procedure Laterality Date  . APPENDECTOMY    . BREAST REDUCTION SURGERY    . CARDIAC CATHETERIZATION  2002   normal LV function and no significant coronary obstruction  . CATARACT EXTRACTION, BILATERAL Bilateral november 2020 and dcember 2020  . CYST EXCISION N/A 01/11/2020   Procedure: EXCISION OF ANAL CANAL MASS;  Surgeon: Ileana Roup, MD;  Location: [facility];  Service: General;  Laterality: N/A;  . ESOPHAGEAL MANOMETRY N/A 04/04/2017   Procedure: ESOPHAGEAL MANOMETRY (EM);  Surgeon: Laurence Spates, MD;  Location: [facility];  Service: Endoscopy;  Laterality: N/A;  . HEMORRHOID SURGERY N/A 07/13/2019   Procedure: HEMORRHOIDECTOMY;  Surgeon: Johnathan Hausen, MD;  Location: [place];  Service: General;  Laterality: N/A;  . KIDNEY SURGERY     kidney stones   . PARTIAL HYSTERECTOMY    . RECTAL EXAM UNDER ANESTHESIA N/A 01/11/2020   Procedure: RECTAL EXAM UNDER ANESTHESIA;  Surgeon: Ileana Roup, MD;  Location: [facility];  Service: General;  Laterality: N/A;  . TONSILLECTOMY      Family History  Problem Relation Age of Onset  .  Heart disease Mother   . Liver disease Mother   . Heart disease Father   . Heart attack Father   . Cancer Father        prostate CA  . Cancer Maternal Grandmother 60       ovarian cancer  . Coronary artery disease Other        Fx of  . Heart attack Other   . Diabetes Other     Social History Social History   Tobacco Use  . Smoking status: Former Smoker    Types: E-cigarettes    Quit date: 07/08/2017    Years since quitting: 2.5  . Smokeless tobacco: Never Used  . Tobacco comment: uses electronic  Vaping Use  . Vaping Use: Former  . Quit date: 07/03/2016  Substance Use Topics  . Alcohol use: No  . Drug use: No    Allergies  Allergen Reactions  . Cephalexin Anaphylaxis and Other (See Comments)    Kef tabs only (maybe dye) Capsules-anaphylxis  . Amoxapine And Related   . Amoxicillin-Pot Clavulanate Other (See Comments)  . Atorvastatin Other (See Comments)    intolernace  . Cefaclor Other (See Comments)  . Ciprofloxacin Nausea And Vomiting  . Codeine Hives  . Doxycycline Calcium Nausea And Vomiting  . Fenofibrate Micronized Other (See Comments)    Cramping  . Fish Oil Other (See Comments)  . Flagyl [Metronidazole Hcl]   . Levofloxacin In D5w Other (See Comments)    Unknown   . Metronidazole Nausea And Vomiting  . Ofloxacin Other (See Comments)  . Prednisolone Other (See Comments)    Anxiety, palpitations  . Promethazine Hcl Other (See Comments)  . Tetracycline Hcl Other (See Comments)  . Erythromycin Rash  . Nitrofurantoin Itching, Nausea And Vomiting and Rash    Current Outpatient Medications  Medication Sig Dispense Refill  . clorazepate (TRANXENE) 7.5 MG tablet Take 7.5 mg by mouth 2 (two) times daily.  1  . CRESTOR 5 MG tablet Take 5 mg by mouth every evening.   1  . esomeprazole (NEXIUM) 40 MG capsule Take 40 mg by mouth daily at 12 noon.    [name] FLUoxetine (PROZAC) 20 MG capsule Take 20 mg by mouth every morning.    . metFORMIN (GLUCOPHAGE) 500 MG tablet  Take by mouth 2 (two) times daily with a meal.    . methenamine (HIPREX) 1 g tablet Take 0.5 g by mouth daily.     [name] NU-IRON 150 MG capsule Take 150 mg by mouth daily.   2  . polyvinyl alcohol (LIQUIFILM TEARS) 1.4 % ophthalmic solution Place 1 drop into both eyes as needed for dry eyes.    [name] propranolol (INDERAL) 10  MG tablet TAKE 1 TABLET BY MOUTH TWICE A DAY (Patient taking differently: Take 10 mg by mouth 2 (two) times daily. ) 180 tablet 2  . ibuprofen (ADVIL) 800 MG tablet Take 1 tablet (800 mg total) by mouth every 8 (eight) hours as needed. (Patient not taking: Reported on 01/10/2020) 30 tablet 0  . traMADol (ULTRAM) 50 MG tablet Take 1 tablet (50 mg total) by mouth every 6 (six) hours as needed for up to 5 days (severe postop pain not controlled with tylenol and ibuprofen first). (Patient not taking: Reported on 01/16/2020) 20 tablet 0   No current facility-administered medications for this visit.    Review of Systems  Constitutional: Negative for appetite change, chills, fatigue, fever and unexpected weight change.  HENT:   Negative for mouth sores, nosebleeds, sore throat and trouble swallowing.   Eyes: Negative for eye problems and icterus.  Respiratory: Negative for cough, hemoptysis, shortness of breath and wheezing.   Cardiovascular: Negative for chest pain and leg swelling.  Gastrointestinal: Positive for occasional bright red blood per rectum. Negative for abdominal pain, constipation, diarrhea, nausea and vomiting.  Genitourinary: Negative for bladder incontinence, difficulty urinating, dysuria, frequency and hematuria.   Musculoskeletal: Negative for back pain, gait problem, neck pain and neck stiffness.  Skin: Negative for itching and rash.  Neurological: Negative for dizziness, extremity weakness, gait problem, headaches, light-headedness and seizures.  Hematological: Negative for adenopathy. Does not bruise/bleed easily.  Psychiatric/Behavioral: Negative for confusion,  depression and sleep disturbance. The patient is not nervous/anxious.     PHYSICAL EXAMINATION:  Blood pressure 132/85, pulse 77, temperature 97.8 F (36.6 C), temperature source Oral, resp. rate 18, height 5\' 6"  (1.676 m), weight 163 lb 8 oz (74.2 kg), SpO2 98 %.  ECOG PERFORMANCE STATUS: 1  Physical Exam  Constitutional: Oriented to person, place, and time and well-developed, well-nourished, and in no distress.  HENT:  Head: Normocephalic and atraumatic.  Mouth/Throat: Oropharynx is clear and moist. No oropharyngeal exudate.  Eyes: Conjunctivae are normal. Right eye exhibits no discharge. Left eye exhibits no discharge. No scleral icterus.  Neck: Normal range of motion. Neck supple.  Cardiovascular: Normal rate, regular rhythm, normal heart sounds and intact distal pulses.   Pulmonary/Chest: Effort normal and breath sounds normal. No respiratory distress. No wheezes. No rales.  Abdominal: Soft. Bowel sounds are normal. Exhibits no distension and no mass. There is no tenderness.  Musculoskeletal: Normal range of motion. Exhibits no edema.  Lymphadenopathy:    No cervical adenopathy.  Neurological: Alert and oriented to person, place, and time. Exhibits normal muscle tone. Gait normal. Coordination normal.  Skin: Skin is warm and dry. No rash noted. Not diaphoretic. No erythema. No pallor.  Psychiatric: Mood, memory and judgment normal.  Vitals reviewed.  LABORATORY DATA: Lab Results  Component Value Date   WBC 4.3 01/10/2020   HGB 11.7 (L) 01/10/2020   HCT 39.2 01/10/2020   MCV 81.8 01/10/2020   PLT 248 01/10/2020      Chemistry      Component Value Date/Time   NA 140 01/10/2020 0849   K 4.2 01/10/2020 0849   CL 104 01/10/2020 0849   CO2 27 01/10/2020 0849   BUN 21 01/10/2020 0849   CREATININE 0.84 01/10/2020 0849      Component Value Date/Time   CALCIUM 9.4 01/10/2020 0849   ALKPHOS 68 07/26/2012 1628   AST 28 07/26/2012 1628   ALT 28 07/26/2012 1628   BILITOT  0.4 07/26/2012 1628  RADIOGRAPHIC STUDIES: No results found.  ASSESSMENT: This is a very pleasant 59 year old Caucasian female diagnosed with squamous cell carcinoma of the anus  1.  Recurrent squamous cell carcinoma the anus, pending further staging work up. Initially diagnosed as pT1, pNx  -07/13/19 patient seen by Dr. Hassell Done for a hemorrhoidectomy and EUA. Hemorrhoids at the 10 oclock and 2 oclock positions. The final pathology [id number]) was consistent with invasive moderately differentiated squamous cell carcinoma, 1.4 cm.  The carcinoma invaded a depth of 0.4 cm the lateral mucosal margin was positive for high-grade dysplasia. Deep resection margin is negative for carcinoma (0.2 cm). There was no evidence of lymphovascular perineural invasion. pT1, pNx  -09/04/19 CT scan of the chest, abdomen, and pelvis unremarkable -01/2020 Patient reported rectal bleeding. Exam showed evidence of recurrence. Patient status post excision under the care of Dr. Dema Severin on 01/11/20.  -01/11/20 Pathology was consistent with squamous cell carcinoma of the anus -The patient was seen with Dr. Burr Medico today. Dr. Burr Medico personally spoke to pathology who verbally reported that the tumor recurrence was 1.5 cm, negative margins, and depth <1 mm -I will arrange for a PET scan to complete the staging workup.  -I will also arrange for the patient to have HIV testing to assess for risk factors for developing anal cancer.  -Dr. Burr Medico reviewed the natural course of anal cancer, and the risk of recurrence after definitive therapy.  -Dr. Burr Medico discussed that we will review her case at [facility] next week. She discussed that her treatment will likely consist of concurrent chemoRT with Mitomycin and 5FU on week 1 and 5, which is the standard therapy.  --Chemotherapy Side Effects/Consent: Side effects including but does not limited to, fatigue, nausea, vomiting, diarrhea, hair loss, neuropathy, fluid retention, renal and  kidney dysfunction, neutropenic fever, needed for blood transfusion, bleeding, were discussed with patient in great detail.  -The goal of therapy is curative -The patient has a consultion with radiation oncology on 02/05/20 which will give her a few weeks to heal from her recent procedure on 01/11/20. -If the plan is to move forward with Concurrent chemoRT after this is discussed at tumor board, we will call the patient next week and arrange for her to have a Chemo education class before start of treatment. She will have PICC line placed on first day of chemo on week 1 and 5 if she proceeds with treatment -Dr. Burr Medico also recommend that she follow up with her Gyn as her vagina can be effected from treatment.  -We will arrange follow up once her treatment plan is confirmed. She will likely begin treatment in early September  2. Iron deficiency anemia -Patient evaluated in the past by Dr. Lamonte Sakai here at [facility] as well as her Gastroenterologist -Etiology reportedly unclear as to her iron deficiency.  -S/P hysterctomy -Currently managed by PCP, Dr. Joylene Draft.  -She receives iron infusions with Feraheme PRN at an outside clinic -Discussed we are more than happy to monitor her iron here as well as arrange for iron infusions if needed -Most recent iron infusions June/July 2021.  -Will check her iron studies on routine blood work  3. Diabetes Mellitis -Followed by her PCP -Will check A1C since the patient believes this has not been checked recently  PLAN:  -PET scan for staging. -HIV testing today which was non-reactive. I called the patient with the results -Finalize treatment plan after multidisciplinary clinic next week. Reached out to RN navigator to update her on the patient. -Follow up with  Dr. Lisbeth Renshaw as planned on 02/05/20 -Chemo Education Class before first treatment. I will arrange for this once her treatment plan is finalized -PICC line, lab and chemo Mitomycin and 5FU will be scheduled with first day  of radiation once her treatment plan is finalized/confirmed next week  -f/u likely on her first day treatment  -Non-urgent dietician referral    Melinda [name] January 16, 2020, 4:28 PM  Addendum  I have seen the patient, examined her. I agree with the assessment and and plan and have edited the notes.   Melinda [name] is a 59 yo female with PMH of diabetes, dyslipidemia, hypertension, iron deficiency, was incidentally found to have invasive squamous cell carcinoma of anus from hemorrhoidectomy in 08/2019, surgical margins were negative but very close, and staging scan was also negative.  She was observed afterwards, but developed local recurrence lately. She underwent surgical biopsy by Dr. Dema Severin, and the deeper margin was very close (<19mm).  Given the local recurrence, close margin, there is a high possibility that she may have residual disease and is at very high risk for recurrence.  I recommend staging PET scan, and concurrent chemoradiation with 5-FU and mitomycin.  I discussed with Dr. Dema Severin, he also recommends CCRT. Potential benefit and side effects of chemotherapy were discussed with patient and her husband in details. She agrees to proceed.  She is scheduled to see radiation oncologist Dr. Lisbeth Renshaw in a few weeks, and plan to start treatment in 3-4 weeks. Will present her case in GI tumor board next week, and will give her a call with the final recommendation.  All questions were answered.  I spent a total of 60 mins in her care today.   Melinda [name]  01/16/2020   .

## 2020-01-15 NOTE — Telephone Encounter (Signed)
Received an urgent new pt referral from Dr. Dema Severin at [facility] for squamous cell cancer of the anus. Melinda [name] has been cld and scheduled to see Cassie on 8/11 at 9am along with Dr. Burr Medico. Pt aware to arrive 15 minutes early.

## 2020-01-16 ENCOUNTER — Inpatient Hospital Stay: Payer: BC Managed Care – PPO | Attending: Physician Assistant | Admitting: Physician Assistant

## 2020-01-16 ENCOUNTER — Other Ambulatory Visit: Payer: Self-pay

## 2020-01-16 ENCOUNTER — Other Ambulatory Visit: Payer: Self-pay | Admitting: Hematology

## 2020-01-16 ENCOUNTER — Inpatient Hospital Stay: Payer: BC Managed Care – PPO

## 2020-01-16 ENCOUNTER — Encounter: Payer: Self-pay | Admitting: Physician Assistant

## 2020-01-16 VITALS — BP 132/85 | HR 77 | Temp 97.8°F | Resp 18 | Ht 66.0 in | Wt 163.5 lb

## 2020-01-16 DIAGNOSIS — N3000 Acute cystitis without hematuria: Secondary | ICD-10-CM | POA: Diagnosis not present

## 2020-01-16 DIAGNOSIS — Z5111 Encounter for antineoplastic chemotherapy: Secondary | ICD-10-CM | POA: Diagnosis not present

## 2020-01-16 DIAGNOSIS — Z9049 Acquired absence of other specified parts of digestive tract: Secondary | ICD-10-CM | POA: Diagnosis not present

## 2020-01-16 DIAGNOSIS — C210 Malignant neoplasm of anus, unspecified: Secondary | ICD-10-CM

## 2020-01-16 DIAGNOSIS — Z7189 Other specified counseling: Secondary | ICD-10-CM

## 2020-01-16 DIAGNOSIS — D509 Iron deficiency anemia, unspecified: Secondary | ICD-10-CM | POA: Diagnosis not present

## 2020-01-16 DIAGNOSIS — N390 Urinary tract infection, site not specified: Secondary | ICD-10-CM | POA: Diagnosis not present

## 2020-01-16 DIAGNOSIS — E119 Type 2 diabetes mellitus without complications: Secondary | ICD-10-CM | POA: Insufficient documentation

## 2020-01-16 DIAGNOSIS — B9620 Unspecified Escherichia coli [E. coli] as the cause of diseases classified elsewhere: Secondary | ICD-10-CM | POA: Diagnosis not present

## 2020-01-16 LAB — HIV ANTIBODY (ROUTINE TESTING W REFLEX): HIV Screen 4th Generation wRfx: NONREACTIVE

## 2020-01-16 NOTE — Patient Instructions (Addendum)
Summary:  -The sample (biopsy) that they took of your tumor was consistent with a subtype of called Squamous Cell Carcinoma. This is the most common type of lung cancer.  -We covered a lot of important information at your appointment today regarding what the treatment plan is moving forward. Here are the the main points that were discussed at your office visit with [place] today:  -We will discuss your case at the tumor board next week and will call you with the plan.  -If we decide that the recommended option is to move forward with treatment, then the treatment consists of two chemotherapy drugs. I have them listed below. Radiation is usually given every 5.5 weeks. Chemo is usually given on Monday. You go home with a pump and detach it on Friday. This will repeat every 5 weeks.  -If you move forward with the treatment  I would like you to attend a Chemotherapy Education Class. This involves having you sit down with one of our nurse educators. She will discuss with your one-on-one more details about your treatment as well as general information about resources here at the cancer center.   Medications:   Referrals or Imaging: -I will arrange for you to have a staging PET scan performed. If you need to reach radiology scheduling, their number is [phone] -You have an appointment with radiation oncology scheduled for 8/30.   Follow up:  -We will call you next week after we discuss your case at the conference.

## 2020-01-17 ENCOUNTER — Encounter: Payer: Self-pay | Admitting: General Practice

## 2020-01-17 ENCOUNTER — Other Ambulatory Visit: Payer: Self-pay | Admitting: Hematology

## 2020-01-17 ENCOUNTER — Telehealth: Payer: Self-pay | Admitting: Physician Assistant

## 2020-01-17 DIAGNOSIS — N3000 Acute cystitis without hematuria: Secondary | ICD-10-CM | POA: Diagnosis not present

## 2020-01-17 MED ORDER — PROCHLORPERAZINE MALEATE 10 MG PO TABS: 10.0000 mg | ORAL_TABLET | Freq: Four times a day (QID) | ORAL | 1 refills | Status: DC | PRN

## 2020-01-17 MED ORDER — ONDANSETRON HCL 8 MG PO TABS: 8.0000 mg | ORAL_TABLET | Freq: Two times a day (BID) | ORAL | 1 refills | Status: DC | PRN

## 2020-01-17 NOTE — Progress Notes (Signed)
[place] Spiritual Care Note  Reached Ms Deberry by phone per referral from [facility]/PA. Provided empathic listening, pastoral reflection, emotional support, and affirmation of strengths as Ms Wahba shared and processed her anxieties, apprehensions, and sources of support. Referred Ms Tokar's follow-up questions (about the aggressiveness of her care plan, etc) back to Cassie/PA via inbasket and plan to phone Ms Mellott again next week for follow-up pastoral support.   [place], [place], [facility] Pager [phone] Voicemail [phone]

## 2020-01-17 NOTE — Telephone Encounter (Signed)
Scheduled appointment per 8/11 scheduling message. Patient is aware of appointment date and time.

## 2020-01-17 NOTE — Addendum Note (Signed)
Addended by: Truitt Merle on: 01/17/2020 05:04 PM   Modules accepted: Orders

## 2020-01-18 DIAGNOSIS — N3000 Acute cystitis without hematuria: Secondary | ICD-10-CM | POA: Diagnosis not present

## 2020-01-21 ENCOUNTER — Telehealth: Payer: Self-pay | Admitting: Hematology

## 2020-01-21 ENCOUNTER — Other Ambulatory Visit: Payer: Self-pay | Admitting: Hematology

## 2020-01-21 ENCOUNTER — Other Ambulatory Visit: Payer: Self-pay | Admitting: Physician Assistant

## 2020-01-21 ENCOUNTER — Telehealth: Payer: Self-pay | Admitting: Physician Assistant

## 2020-01-21 DIAGNOSIS — C210 Malignant neoplasm of anus, unspecified: Secondary | ICD-10-CM

## 2020-01-21 NOTE — Telephone Encounter (Signed)
Scheduled per 8/16 scheduling message. Pt is aware of appt on 8/19. Unable to reach pt with appts added on 8/30. Left voicemail with appt time and date.

## 2020-01-21 NOTE — Telephone Encounter (Signed)
I received a message that the patient had questions regarding her treatment plan. I called the patient and answered her questions. We discussed the purpose of her upcoming appointments and prescribed anti-emetics. She also expressed questions regarding her recommended treatment and was concerned about side effects. Discussed that Dr. Dema Severin, Dr. Burr Medico, and Dr. Lisbeth Renshaw are all in agreement that her current treatment regimen is what they would recommend to ensure the best possible outcomes and reduce the risk of recurrence. Also discussed that her treatment is standard for her current condition as she had several questions comparing her treatment to personal contacts treated for malignancies. Encouraged her to always feel free to call us if she has any further questions.

## 2020-01-22 ENCOUNTER — Other Ambulatory Visit: Payer: Self-pay

## 2020-01-22 ENCOUNTER — Encounter: Payer: Self-pay | Admitting: Radiation Oncology

## 2020-01-22 ENCOUNTER — Ambulatory Visit
Admission: RE | Admit: 2020-01-22 | Discharge: 2020-01-22 | Disposition: A | Discharge status: Home / Self Care | Payer: BC Managed Care – PPO | Source: Ambulatory Visit | Attending: Radiation Oncology | Admitting: Radiation Oncology

## 2020-01-22 ENCOUNTER — Ambulatory Visit
Admission: RE | Admit: 2020-01-22 | Discharge: 2020-01-22 | Disposition: A | Discharge status: Home / Self Care | Payer: BC Managed Care – PPO | Source: Ambulatory Visit | Attending: Hematology | Admitting: Hematology

## 2020-01-22 VITALS — BP 124/86 | HR 79 | Temp 98.0°F | Resp 18 | Ht 66.0 in | Wt 162.2 lb

## 2020-01-22 DIAGNOSIS — E119 Type 2 diabetes mellitus without complications: Secondary | ICD-10-CM | POA: Diagnosis not present

## 2020-01-22 DIAGNOSIS — K649 Unspecified hemorrhoids: Secondary | ICD-10-CM | POA: Diagnosis not present

## 2020-01-22 DIAGNOSIS — Z87891 Personal history of nicotine dependence: Secondary | ICD-10-CM | POA: Diagnosis not present

## 2020-01-22 DIAGNOSIS — Z7984 Long term (current) use of oral hypoglycemic drugs: Secondary | ICD-10-CM | POA: Insufficient documentation

## 2020-01-22 DIAGNOSIS — E785 Hyperlipidemia, unspecified: Secondary | ICD-10-CM | POA: Diagnosis not present

## 2020-01-22 DIAGNOSIS — C210 Malignant neoplasm of anus, unspecified: Secondary | ICD-10-CM | POA: Diagnosis not present

## 2020-01-22 DIAGNOSIS — Z79899 Other long term (current) drug therapy: Secondary | ICD-10-CM | POA: Diagnosis not present

## 2020-01-22 DIAGNOSIS — K219 Gastro-esophageal reflux disease without esophagitis: Secondary | ICD-10-CM | POA: Diagnosis not present

## 2020-01-22 DIAGNOSIS — Z8042 Family history of malignant neoplasm of prostate: Secondary | ICD-10-CM | POA: Diagnosis not present

## 2020-01-22 NOTE — Progress Notes (Signed)
Radiation Oncology         [phone] ________________________________  Name: Melinda [name]        MRN: [id number]  Date of Service: 01/22/2020 DOB: [birth year]  EQ:[id number], Elta Guadeloupe, MD  Ileana Roup, MD     REFERRING PHYSICIAN: Ileana Roup, MD   DIAGNOSIS: The encounter diagnosis was Squamous cell cancer, anus ([place]).   HISTORY OF PRESENT ILLNESS: Melinda [name] is a 59 y.o. female seen at the request of Dr. Dema Severin for a newly diagnosed squamous cell carcinoma of the anus. The patient initially presented after having episodes of bright red blood per rectum. She was evaluated, and taken to the operating room on 07/13/2019 for a hemorrhoidectomy. Final pathology revealed a 1.4 cm area of invasive moderately differentiated squamous cell carcinoma. No lymphovascular space invasion or perineural invasion was seen. The deep resection margin was negative for carcinoma. The tumor extension did not invade the superficial anal sphincter. Her case was discussed in multidisciplinary tumor conference, and it was recommended that she be followed in surveillance, she underwent a rectal exam under anesthesia for surveillance on 01/11/2020, and a new lesion was seen in the posterior midline of the anal canal, excision of this revealed squamous cell carcinoma, moderately differentiated.  The deeper margin was less than 1 mm.  Given these findings including local recurrence and close margin, she is felt to be at high risk of local recurrence.  She met with medical oncology, she is to undergo a PET scan on 01/28/2020.  Her case will be discussed tomorrow at GI oncology conference however Dr. Dema Severin and Dr. Burr Medico have spoken already about chemoradiation.  PREVIOUS RADIATION THERAPY: No   PAST MEDICAL HISTORY:  Past Medical History:  Diagnosis Date  . Diabetes mellitus type 2, controlled ([place])   . Dyslipidemia    untreated  . GERD (gastroesophageal reflux disease)   . Hemorrhoids   . Microcytic anemia  06/22/2012  . Nephrolith   . Palpitation    positive tilt table test. Prozac and Inderal treatment effective.       PAST SURGICAL HISTORY: Past Surgical History:  Procedure Laterality Date  . APPENDECTOMY    . BREAST REDUCTION SURGERY    . CARDIAC CATHETERIZATION  2002   normal LV function and no significant coronary obstruction  . CATARACT EXTRACTION, BILATERAL Bilateral november 2020 and dcember 2020  . CYST EXCISION N/A 01/11/2020   Procedure: EXCISION OF ANAL CANAL MASS;  Surgeon: Ileana Roup, MD;  Location: [facility];  Service: General;  Laterality: N/A;  . ESOPHAGEAL MANOMETRY N/A 04/04/2017   Procedure: ESOPHAGEAL MANOMETRY (EM);  Surgeon: Laurence Spates, MD;  Location: [facility];  Service: Endoscopy;  Laterality: N/A;  . HEMORRHOID SURGERY N/A 07/13/2019   Procedure: HEMORRHOIDECTOMY;  Surgeon: Johnathan Hausen, MD;  Location: [facility];  Service: General;  Laterality: N/A;  . KIDNEY SURGERY     kidney stones   . PARTIAL HYSTERECTOMY    . RECTAL EXAM UNDER ANESTHESIA N/A 01/11/2020   Procedure: RECTAL EXAM UNDER ANESTHESIA;  Surgeon: Ileana Roup, MD;  Location: [facility];  Service: General;  Laterality: N/A;  . TONSILLECTOMY       FAMILY HISTORY:  Family History  Problem Relation Age of Onset  . Heart disease Mother   . Liver disease Mother   . Heart disease Father   . Heart attack Father   . Cancer Father        prostate CA  . Cancer Maternal  Grandmother 60       ovarian cancer  . Coronary artery disease Other        Fx of  . Heart attack Other   . Diabetes Other      SOCIAL HISTORY:  reports that she quit smoking about 2 years ago. Her smoking use included e-cigarettes. She has never used smokeless tobacco. She reports that she does not drink alcohol and does not use drugs.  The patient is married and lives in [place]. She is accompanied by her husband. They have two poodles they adore.  ALLERGIES: Cephalexin, Amoxapine and  related, Amoxicillin-pot clavulanate, Atorvastatin, Cefaclor, Ciprofloxacin, Codeine, Doxycycline calcium, Fenofibrate micronized, Fish oil, Flagyl [metronidazole hcl], Levofloxacin in d5w, Metronidazole, Ofloxacin, Prednisolone, Promethazine hcl, Tetracycline hcl, Erythromycin, and Nitrofurantoin   MEDICATIONS:  Current Outpatient Medications  Medication Sig Dispense Refill  . clorazepate (TRANXENE) 7.5 MG tablet Take 7.5 mg by mouth 2 (two) times daily.  1  . CRESTOR 5 MG tablet Take 5 mg by mouth every evening.   1  . esomeprazole (NEXIUM) 40 MG capsule Take 40 mg by mouth daily at 12 noon.    [name] FLUoxetine (PROZAC) 20 MG capsule Take 20 mg by mouth every morning.    [name] ibuprofen (ADVIL) 800 MG tablet Take 1 tablet (800 mg total) by mouth every 8 (eight) hours as needed. (Patient not taking: Reported on 01/10/2020) 30 tablet 0  . metFORMIN (GLUCOPHAGE) 500 MG tablet Take by mouth 2 (two) times daily with a meal.    . methenamine (HIPREX) 1 g tablet Take 0.5 g by mouth daily.     [name] NU-IRON 150 MG capsule Take 150 mg by mouth daily.   2  . ondansetron (ZOFRAN) 8 MG tablet Take 1 tablet (8 mg total) by mouth 2 (two) times daily as needed (Nausea or vomiting). 30 tablet 1  . polyvinyl alcohol (LIQUIFILM TEARS) 1.4 % ophthalmic solution Place 1 drop into both eyes as needed for dry eyes.    [name] prochlorperazine (COMPAZINE) 10 MG tablet Take 1 tablet (10 mg total) by mouth every 6 (six) hours as needed (Nausea or vomiting). 30 tablet 1  . propranolol (INDERAL) 10 MG tablet TAKE 1 TABLET BY MOUTH TWICE A DAY (Patient taking differently: Take 10 mg by mouth 2 (two) times daily. ) 180 tablet 2   No current facility-administered medications for this encounter.     REVIEW OF SYSTEMS: On review of systems, the patient reports that she is doing well overall. She is anxious about the whole process and wants to know more about why she would need aggressive therapy for a cancer that is no longer present in her  body. She reports her bowels are moving more regularly but she has had a lifetime of symptoms of constipation. She reports her bleeding per rectum has resolved. No pelvic pain or pressure is noted. She does have a history of iron deficiency and has to receive iron infusions to keep her Hgb above 7. No other complaints are verbalized.      PHYSICAL EXAM:  Wt Readings from Last 3 Encounters:  01/16/20 163 lb 8 oz (74.2 kg)  01/11/20 162 lb (73.5 kg)  01/10/20 162 lb (73.5 kg)   Temp Readings from Last 3 Encounters:  01/16/20 97.8 F (36.6 C) (Oral)  01/11/20 97.7 F (36.5 C)  01/10/20 98.1 F (36.7 C) (Oral)   BP Readings from Last 3 Encounters:  01/16/20 132/85  01/11/20 131/67  01/10/20 (!) 141/73  Pulse Readings from Last 3 Encounters:  01/16/20 77  01/11/20 (!) 58  01/10/20 68   In general this is a well appearing caucasian female in no acute distress. She's alert and oriented x4 and appropriate throughout the examination. Cardiopulmonary assessment is negative for acute distress and she exhibits normal effort.    ECOG = 0  0 - Asymptomatic (Fully active, able to carry on all predisease activities without restriction)  1 - Symptomatic but completely ambulatory (Restricted in physically strenuous activity but ambulatory and able to carry out work of a light or sedentary nature. For example, light housework, office work)  2 - Symptomatic, <50% in bed during the day (Ambulatory and capable of all self care but unable to carry out any work activities. Up and about more than 50% of waking hours)  3 - Symptomatic, >50% in bed, but not bedbound (Capable of only limited self-care, confined to bed or chair 50% or more of waking hours)  4 - Bedbound (Completely disabled. Cannot carry on any self-care. Totally confined to bed or chair)  5 - Death   Eustace Pen MM, Creech RH, Tormey DC, et al. [phone]). "Toxicity and response criteria of the [facility]". [place] Oncol. 5 (6): 649-55    LABORATORY DATA:  Lab Results  Component Value Date   WBC 4.3 01/10/2020   HGB 11.7 (L) 01/10/2020   HCT 39.2 01/10/2020   MCV 81.8 01/10/2020   PLT 248 01/10/2020   Lab Results  Component Value Date   NA 140 01/10/2020   K 4.2 01/10/2020   CL 104 01/10/2020   CO2 27 01/10/2020   Lab Results  Component Value Date   ALT 28 07/26/2012   AST 28 07/26/2012   ALKPHOS 68 07/26/2012   BILITOT 0.4 07/26/2012      RADIOGRAPHY: No results found.     IMPRESSION/PLAN: 1. Squamous Cell Carcinoma of the Anus. Dr. Lisbeth Renshaw discusses the pathology findings and reviews the nature of anal cancers. He discusses the neet for PET imaging to rule out further extent of disease. We discussed the risks, benefits, short, and long term effects of radiotherapy, and the patient is interested in proceeding, though is still concerned about the process in general. Dr. Lisbeth Renshaw discusses the delivery and logistics of radiotherapy and anticipates a course of 6 weeks of radiotherapy. Written consent is obtained and placed in the chart, a copy was provided to the patient. She will proceed with simulation on 01/28/20 and we anticipate beginning radiotherapy on 02/04/20.   In a visit lasting 90 minutes, greater than 50% of the time was spent face to face discussing the patient's condition, in preparation for the discussion, and coordinating the patient's care.   The above documentation reflects my direct findings during this shared patient visit. Please see the separate note by Dr. Lisbeth Renshaw on this date for the remainder of the patient's plan of care.    Carola Rhine, PAC

## 2020-01-23 ENCOUNTER — Other Ambulatory Visit: Payer: Self-pay

## 2020-01-23 ENCOUNTER — Encounter: Payer: Self-pay | Admitting: *Deleted

## 2020-01-23 NOTE — Progress Notes (Signed)
[facility] [place] Psychosocial Distress Screening Clinical Social Work  Clinical Social Work was referred by distress screening protocol.  The patient scored a 8 on the Psychosocial Distress Thermometer which indicates moderate distress. Patient was seen my [facility] on 01/17/20 to address distress and provide emotional support.  [place] plans to follow up with patient this week and refer to CSW if social work needs are identified, and /or additional support is needed.      ON[facility]N DISTRESS SCREENING 01/22/2020  Screening Type Initial Screening  Distress experienced in past week (1-10) 8  Emotional problem type Nervousness/Anxiety;Adjusting to illness  Spiritual/Religous concerns type Facing my mortality  Information Concerns Type Lack of info about diagnosis;Lack of info about treatment;Lack of info about complementary therapy choices  Other Contact by phone [phone]   Johnnye Lana, MSW, LCSW, OSW-C Clinical Social Worker [facility] [phone]

## 2020-01-24 ENCOUNTER — Other Ambulatory Visit: Payer: Self-pay

## 2020-01-24 ENCOUNTER — Inpatient Hospital Stay: Payer: BC Managed Care – PPO | Admitting: Nutrition

## 2020-01-24 ENCOUNTER — Encounter: Payer: Self-pay | Admitting: Hematology

## 2020-01-24 ENCOUNTER — Inpatient Hospital Stay: Payer: BC Managed Care – PPO

## 2020-01-24 ENCOUNTER — Encounter: Payer: Self-pay | Admitting: General Practice

## 2020-01-24 NOTE — Progress Notes (Signed)
Met with patient at registration to introduce myself as Arboriculturist and to offer available resources.  Discussed one-time $1000 Radio broadcast assistant to assist with personal expenses while going through treatment.  Gave her my card if interested in applying and for any additional financial questions or concerns.

## 2020-01-24 NOTE — Progress Notes (Signed)
[facility] Spiritual Care Note  Met Melinda [name] and her husband Melinda [name] in person following her appointment with Pamala Hurry Neff/RD. Built rapport, shared stories and humor, normalized feelings of nervousness and fear of the unknown, affirmed strengths (including resilience and transferable coping skills), and made a plan to see Mylene at her first infusion. She also knows to reach out as needed in the meantime.   [place], [place], [facility] Pager [phone] Voicemail [phone]

## 2020-01-24 NOTE — Progress Notes (Signed)
59 year old female diagnosed with anal cancer.  She is a patient of Dr. Burr Medico. Plan is to treat using 5-FU and mitomycin as well as radiation therapy.  Past medical history includes iron deficiency anemia, GERD, anxiety, diabetes, dyslipidemia, alcohol, and tobacco.  Medications include Nexium, Prozac, and Glucophage.  Labs include hemoglobin A1c of 5.2.  Height: 5 feet 6 inches. Weight: 162.2 pounds. Usual body weight: 158 pounds in February 2021. BMI: 26.19.  I met with patient and her husband regarding nutrition strategies during chemotherapy and radiation treatments from the cancer.  Nutrition diagnosis: Food and nutrition related knowledge deficit related to cancer and associated treatments as evidenced by no prior need for nutrition related information.  Intervention: Patient educated on importance of consuming small, more frequent meals and snacks with adequate calories and protein for weight maintenance. Reviewed some high-protein snacks. Provided oral nutrition supplements to sample.  I also gave some coupons. I provided fact sheets.  They have my contact information.  Questions were answered.  Teach back method used.  Monitoring, evaluation, goals: Patient will tolerate adequate calories and protein for weight maintenance and healing troughout treatment.  Next visit: To be scheduled as needed.  **Disclaimer: This note was dictated with voice recognition software. Similar sounding words can inadvertently be transcribed and this note may contain transcription errors which may not have been corrected upon publication of note.**

## 2020-01-28 ENCOUNTER — Other Ambulatory Visit: Payer: Self-pay

## 2020-01-28 ENCOUNTER — Ambulatory Visit
Admission: RE | Admit: 2020-01-28 | Discharge: 2020-01-28 | Disposition: A | Discharge status: Home / Self Care | Payer: BC Managed Care – PPO | Source: Ambulatory Visit | Attending: Radiation Oncology | Admitting: Radiation Oncology

## 2020-01-28 ENCOUNTER — Ambulatory Visit (HOSPITAL_COMMUNITY)
Admission: RE | Admit: 2020-01-28 | Discharge: 2020-01-28 | Disposition: A | Discharge status: Home / Self Care | Payer: BC Managed Care – PPO | Source: Ambulatory Visit | Attending: Physician Assistant | Admitting: Physician Assistant

## 2020-01-28 DIAGNOSIS — C210 Malignant neoplasm of anus, unspecified: Secondary | ICD-10-CM | POA: Diagnosis not present

## 2020-01-28 DIAGNOSIS — Z510 Encounter for antineoplastic radiation therapy: Secondary | ICD-10-CM | POA: Diagnosis not present

## 2020-01-28 DIAGNOSIS — C44520 Squamous cell carcinoma of anal skin: Secondary | ICD-10-CM | POA: Diagnosis not present

## 2020-01-28 LAB — GLUCOSE, CAPILLARY: Glucose-Capillary: 120 mg/dL — ABNORMAL HIGH (ref 70–99)

## 2020-01-28 MED ORDER — FLUDEOXYGLUCOSE F - 18 (FDG) INJECTION
8.2200 | Freq: Once | INTRAVENOUS | Status: AC | PRN
  Administered 2020-01-28: 8.22 via INTRAVENOUS

## 2020-01-30 DIAGNOSIS — R300 Dysuria: Secondary | ICD-10-CM | POA: Diagnosis not present

## 2020-01-30 DIAGNOSIS — N3010 Interstitial cystitis (chronic) without hematuria: Secondary | ICD-10-CM | POA: Diagnosis not present

## 2020-01-30 DIAGNOSIS — C210 Malignant neoplasm of anus, unspecified: Secondary | ICD-10-CM | POA: Diagnosis not present

## 2020-01-30 DIAGNOSIS — Z510 Encounter for antineoplastic radiation therapy: Secondary | ICD-10-CM | POA: Diagnosis not present

## 2020-01-30 DIAGNOSIS — R8271 Bacteriuria: Secondary | ICD-10-CM | POA: Diagnosis not present

## 2020-01-30 DIAGNOSIS — N3000 Acute cystitis without hematuria: Secondary | ICD-10-CM | POA: Diagnosis not present

## 2020-01-30 NOTE — Progress Notes (Signed)
Pharmacist Chemotherapy M onitoring - Initial Assessment    Anticipated start date: 02/04/20  Regimen:  . Are orders appropriate based on the patient's diagnosis, regimen, and cycle? Yes . Does the plan date match the patient's scheduled date? Yes . Is the sequencing of drugs appropriate? Yes . Are the premedications appropriate for the patient's regimen? Yes . Prior Authorization for treatment is: Approved o If applicable, is the correct biosimilar selected based on the patient's insurance? not applicable  Organ Function and Labs: [name] Are dose adjustments needed based on the patient's renal function, hepatic function, or hematologic function? Yes . Are appropriate labs ordered prior to the start of patient's treatment? Yes . Other organ system assessment, if indicated: N/A . The following baseline labs, if indicated, have been ordered: N/A  Dose Assessment: . Are the drug doses appropriate? Yes . Are the following correct: o Drug concentrations Yes o IV fluid compatible with drug Yes o Administration routes Yes o Timing of therapy Yes . If applicable, does the patient have documented access for treatment and/or plans for port-a-cath placement? not applicable . If applicable, have lifetime cumulative doses been properly documented and assessed? not applicable Lifetime Dose Tracking  No doses have been documented on this patient for the following tracked chemicals: Doxorubicin, Epirubicin, Idarubicin, Daunorubicin, Mitoxantrone, Bleomycin, Oxaliplatin, Carboplatin, Liposomal Doxorubicin  o   Toxicity Monitoring/Prevention: . The patient has the following take home antiemetics prescribed: Ondansetron and Prochlorperazine . The patient has the following take home medications prescribed:  [name] Medication allergies and previous infusion related reactions, if applicable, have been reviewed and addressed.no . The patient's current medication list has been assessed for drug-drug interactions  with their chemotherapy regimen. no significant drug-drug interactions were identified on review.  Order Review: . Are the treatment plan orders signed? Yes . Is the patient scheduled to see a provider prior to their treatment? Yes  I verify that I have reviewed each item in the above checklist and answered each question accordingly.  Yaneth Fairbairn D 01/30/2020 9:05 AM

## 2020-02-01 ENCOUNTER — Telehealth: Payer: Self-pay

## 2020-02-01 NOTE — Telephone Encounter (Signed)
I reviewed Dr Ernestina Penna comments and recommendations with Ms Golob she verbalized understanding.

## 2020-02-01 NOTE — Progress Notes (Signed)
[place]   [phone] [phone]   Clinic Follow up Note   Patient Care Team: Patient, No Pcp Per as PCP - General ([facility]) Fay Records, MD as PCP - Cardiology (Cardiology) Nobie Putnam, MD (Hematology and Oncology) Heilingoetter, Tobe Sos, PA-C as Physician Assistant (Oncology) Truitt Merle, MD as Consulting Physician (Oncology) Jonnie Finner, RN as Oncology Nurse Navigator  Date of Service:  02/04/2020  CHIEF COMPLAINT: F/u of Squamous cell carcinoma of the anus.  SUMMARY OF ONCOLOGIC HISTORY: Oncology History Overview Note  Cancer Staging No matching staging information was found for the patient.    Squamous cell cancer, anus ([place])  07/13/2019 Procedure    EUA, Excision of posterior internal/external hemorrhoid under the care of Dr. Hassell Done    07/13/2019 Pathology Results   - Invasive moderately differentiated squamous cell carcinoma, 1.4 cm.  See comment  - Carcinoma invades for depth of 0.4 cm  - Deep resection margin is negative for carcinoma (0.2 cm)  - Lateral mucosal margin is positive for high-grade dysplasia  - No evidence of lymphovascular perineural invasion  Procedure: Local excision  Tumor Site: Anal canal  Tumor Size: 1.4 cm  Histologic Type: Invasive squamous cell carcinoma  Histologic Grade: G2: Moderately differentiated  Tumor Extension: Carcinoma invades superficial anal sphincter muscle  Margins: Uninvolved by tumor  Treatment Effect: N/A  Regional Lymph Nodes: No lymph nodes submitted or found  Pathologic Stage Classification (pTNM, AJCC 8th Edition):  pT1, pNx  Representative Tumor Block: A1  Comment(s): Lateral mucosal margin is positive for high-grade squamous  dysplasia      07/13/2019 Initial Diagnosis   Squamous cell cancer, anus ([place])   09/04/2019 Imaging   CT scan Chest, Abdomen, and Pelvis   IMPRESSION: 1. No evidence of metastatic disease in the chest, abdomen or pelvis. 2. No  discrete anorectal mass. 3. Chronic findings include: Punctate nonobstructing upper right renal stones and chronic right renal scarring. 4. Aortic Atherosclerosis (ICD10-I70.0).   01/10/2020 Pathology Results   A. ANAL LESION, POSTERIOR MIDLINE, EXCISION:  - Squamous cell carcinoma, moderately differentiated.  Verbally reported by Dr. Saralyn Pilar 1.5 cm, negative margins, depth <1 mm   01/11/2020 Procedure   1. Excision of anal canal lesion (posterior midline) under the care of Dr. Dema Severin     01/28/2020 PET scan   IMPRESSION: 1. Mild focal anal hypermetabolism without discrete mass correlate on the CT images, nonspecific, differential includes postsurgical change or residual anal tumor. 2. No hypermetabolic locoregional or distant metastatic disease. 3. Chronic findings include: Aortic Atherosclerosis (ICD10-I70.0). Diffuse hepatic steatosis. Nonobstructing right nephrolithiasis.     02/04/2020 -  Radiation Therapy   concurrent chemoRT by Dr Lisbeth Renshaw with Mitomycin and 5FU starting 02/04/20.    02/04/2020 -  Chemotherapy   Concurrent chemoRT with Mitomycin and 5FU on week 1 and 5 starting 02/04/20.       CURRENT THERAPY: Concurrent chemoRT with Mitomycin and 5FU on week 1 and 5 starting 02/04/20   INTERVAL HISTORY:  LASONDRA HODGKINS is here for a follow up and treatment. She presents to the clinic alone. She notes she plans to see Dr Dema Severin on 02/06/20. She notes she is doing well and ready to proceed with treatment today. She notes she has hemorrhoids but no pain. I answered her questions pertaining to her treatment, possible side effects and symptom management.    REVIEW OF SYSTEMS:   Constitutional: Denies fevers, chills or abnormal weight loss Eyes: Denies blurriness of  vision Ears, nose, mouth, throat, and face: Denies mucositis or sore throat Respiratory: Denies cough, dyspnea or wheezes Cardiovascular: Denies palpitation, chest discomfort or lower extremity swelling Gastrointestinal:   Denies nausea, heartburn or change in bowel habits (+) hemorrhoids Skin: Denies abnormal skin rashes Lymphatics: Denies new lymphadenopathy or easy bruising Neurological:Denies numbness, tingling or new weaknesses Behavioral/Psych: Mood is stable, no new changes  All other systems were reviewed with the patient and are negative.  MEDICAL HISTORY:  Past Medical History:  Diagnosis Date  . Diabetes mellitus type 2, controlled ([place])   . Dyslipidemia    untreated  . GERD (gastroesophageal reflux disease)   . Hemorrhoids   . Microcytic anemia 06/22/2012  . Nephrolith   . Palpitation    positive tilt table test. Prozac and Inderal treatment effective.    SURGICAL HISTORY: Past Surgical History:  Procedure Laterality Date  . APPENDECTOMY    . BREAST REDUCTION SURGERY    . CARDIAC CATHETERIZATION  2002   normal LV function and no significant coronary obstruction  . CATARACT EXTRACTION, BILATERAL Bilateral november 2020 and dcember 2020  . CYST EXCISION N/A 01/11/2020   Procedure: EXCISION OF ANAL CANAL MASS;  Surgeon: Ileana Roup, MD;  Location: [facility];  Service: General;  Laterality: N/A;  . ESOPHAGEAL MANOMETRY N/A 04/04/2017   Procedure: ESOPHAGEAL MANOMETRY (EM);  Surgeon: Laurence Spates, MD;  Location: [facility];  Service: Endoscopy;  Laterality: N/A;  . HEMORRHOID SURGERY N/A 07/13/2019   Procedure: HEMORRHOIDECTOMY;  Surgeon: Johnathan Hausen, MD;  Location: [facility];  Service: General;  Laterality: N/A;  . KIDNEY SURGERY     kidney stones   . PARTIAL HYSTERECTOMY    . RECTAL EXAM UNDER ANESTHESIA N/A 01/11/2020   Procedure: RECTAL EXAM UNDER ANESTHESIA;  Surgeon: Ileana Roup, MD;  Location: [facility];  Service: General;  Laterality: N/A;  . TONSILLECTOMY      I have reviewed the social history and family history with the patient and they are unchanged from previous note.  ALLERGIES:  is allergic to cephalexin, amoxapine and related,  amoxicillin-pot clavulanate, atorvastatin, cefaclor, ciprofloxacin, codeine, doxycycline calcium, fenofibrate micronized, fish oil, flagyl [metronidazole hcl], levofloxacin in d5w, metronidazole, ofloxacin, prednisolone, promethazine hcl, tetracycline hcl, erythromycin, and nitrofurantoin.  MEDICATIONS:  Current Outpatient Medications  Medication Sig Dispense Refill  . clorazepate (TRANXENE) 7.5 MG tablet Take 7.5 mg by mouth 2 (two) times daily.  1  . CRESTOR 5 MG tablet Take 5 mg by mouth every evening.   1  . esomeprazole (NEXIUM) 40 MG capsule Take 40 mg by mouth daily at 12 noon.    [name] FLUoxetine (PROZAC) 20 MG capsule Take 20 mg by mouth every morning.    [name] ibuprofen (ADVIL) 800 MG tablet Take 1 tablet (800 mg total) by mouth every 8 (eight) hours as needed. (Patient not taking: Reported on 01/10/2020) 30 tablet 0  . metFORMIN (GLUCOPHAGE) 500 MG tablet Take by mouth 2 (two) times daily with a meal.    . methenamine (HIPREX) 1 g tablet Take 0.5 g by mouth daily.     [name] NU-IRON 150 MG capsule Take 150 mg by mouth daily.   2  . ondansetron (ZOFRAN) 8 MG tablet Take 1 tablet (8 mg total) by mouth 2 (two) times daily as needed (Nausea or vomiting). 30 tablet 1  . polyvinyl alcohol (LIQUIFILM TEARS) 1.4 % ophthalmic solution Place 1 drop into both eyes as needed for dry eyes.    [name] prochlorperazine (COMPAZINE)  10 MG tablet Take 1 tablet (10 mg total) by mouth every 6 (six) hours as needed (Nausea or vomiting). 30 tablet 1  . propranolol (INDERAL) 10 MG tablet TAKE 1 TABLET BY MOUTH TWICE A DAY (Patient taking differently: Take 10 mg by mouth 2 (two) times daily. ) 180 tablet 2   No current facility-administered medications for this visit.   Facility-Administered Medications Ordered in Other Visits  Medication Dose Route Frequency Provider Last Rate Last Admin  . lidocaine (XYLOCAINE) 1 % (with pres) injection             PHYSICAL EXAMINATION: ECOG PERFORMANCE STATUS: 1 - Symptomatic but  completely ambulatory  Vitals with BMI 02/04/2020  Height   Weight   BMI   Systolic 408  Diastolic 81  Pulse 70    Due to COVID19 we will limit examination to appearance. Patient had no complaints.  GENERAL:alert, no distress and comfortable SKIN: skin color normal, no rashes or significant lesions EYES: normal, Conjunctiva are pink and non-injected, sclera clear  NEURO: alert & oriented x 3 with fluent speech   LABORATORY DATA:  I have reviewed the data as listed CBC Latest Ref Rng & Units 02/04/2020 01/10/2020 02/28/2016  WBC 4.0 - 10.5 K/uL 5.2 4.3 6.8  Hemoglobin 12.0 - 15.0 g/dL 11.7(L) 11.7(L) 10.2(L)  Hematocrit 36 - 46 % 37.9 39.2 35.0(L)  Platelets 150 - 400 K/uL 271 248 304     CMP Latest Ref Rng & Units 02/04/2020 01/10/2020 07/10/2019  Glucose 70 - 99 mg/dL 149(H) 114(H) 233(H)  BUN 8 - 23 mg/dL 21 21 23(H)  Creatinine 0.44 - 1.00 mg/dL 0.85 0.84 0.78  Sodium 135 - 145 mmol/L 139 140 132(L)  Potassium 3.5 - 5.1 mmol/L 4.3 4.2 4.4  Chloride 98 - 111 mmol/L 103 104 98  CO2 22 - 32 mmol/L _0 Calcium 8.9 - 10.3 mg/dL 10.6(H) 9.4 9.4  Total Protein 6.5 - 8.1 g/dL 7.3 - -  Total Bilirubin 0.3 - 1.2 mg/dL 0.2(L) - -  Alkaline Phos 38 - 126 U/L 49 - -  AST 15 - 41 U/L 19 - -  ALT 0 - 44 U/L 28 - -      RADIOGRAPHIC STUDIES: I have personally reviewed the radiological images as listed and agreed with the findings in the report. IR PICC PLACEMENT RIGHT >5 YRS INC IMG GUIDE  Result Date: 02/04/2020 INDICATION: Patient with rectal cancer requiring reliable access for chemotherapy. Interventional Radiology asked to place a PICC. EXAM: ULTRASOUND AND FLUOROSCOPIC GUIDED PICC LINE INSERTION MEDICATIONS: 1% lidocaine, 1 ml CONTRAST:  None FLUOROSCOPY TIME:  36 seconds (4 mGy) COMPLICATIONS: None immediate. TECHNIQUE: The procedure, risks, benefits, and alternatives were explained to the patient and informed written consent was obtained. The right upper extremity was prepped  with chlorhexidine in a sterile fashion, and a sterile drape was applied covering the operative field. Maximum barrier sterile technique with sterile gowns and gloves were used for the procedure. A timeout was performed prior to the initiation of the procedure. Local anesthesia was provided with 1% lidocaine. After the overlying soft tissues were anesthetized with 1% lidocaine, a micropuncture kit was utilized to access the right basilic vein. Real-time ultrasound guidance was utilized for vascular access including the acquisition of a permanent ultrasound image documenting patency of the accessed vessel. A guidewire was advanced to the level of the superior caval-atrial junction for measurement purposes and the PICC line was cut to length. A peel-away sheath was  placed and a 34 cm, 5 [place], single lumen was inserted to level of the superior caval-atrial junction. A post procedure spot fluoroscopic was obtained. The catheter easily aspirated and flushed and was secured in place. A dressing was placed. The patient tolerated the procedure well without immediate post procedural complication. FINDINGS: After catheter placement, the tip lies within the superior cavoatrial junction. The catheter aspirates and flushes normally and is ready for immediate use. IMPRESSION: Successful ultrasound and fluoroscopic guided placement of a right basilic vein approach, 34 cm, 5 French, single lumen PICC with tip at the superior caval-atrial junction. The PICC line is ready for immediate use. Read by: Soyla Dryer, NP Electronically Signed   By: Sandi Mariscal M.D.   On: 02/04/2020 13:35     ASSESSMENT & PLAN:  Melinda [name] is a 59 y.o. female with    1. Recurrent squamous cell carcinoma the anus, pT1N0M0  -She initially presented to Dr Hassell Done for a hemorrhoidectomy and EUA on 07/13/19. Report showed Hemorrhoids at the 10 oclock and 2 oclock positions. The final pathology was consistent with invasive moderately  differentiated squamous cell carcinoma, 1.4 cm.  The carcinoma invaded a depth of 0.4 cm the lateral mucosal margin was positive for high-grade dysplasia. Deep resection margin is negative for carcinoma (0.2 cm). There was no evidence of lymphovascular perineural invasion. pT1, pNx  -Her 09/04/19 CT scan of the chest, abdomen, and pelvis unremarkable -she had local recurrence, underwent surgical excision under the care of Dr. Dema Severin on 01/11/20. Surgical path was consistent with squamous cell carcinoma of the anus. -I reviewed with her that her Staging PET from 01/28/20 showed no node or distant metastasis. HIV Testing Negative.  -I discussed standard treatment for anal cancer includes concurrent chemoRT for 6 weeks with Mitomycin and 5FU on week 1 and 5. Plan to start today (02/04/20). Chemo aide effects reviewed again, consent obtained  -Labs reviewed and adequate to proceed with week 1 Mitomycin and 5FU today. She has PICC line in place and will have removed on day 5.  -I answered all her questions to her satisfaction and understanding.  -f/u next week. I encouraged her to watch for significant or unexpected side effects, fever, dehydration and mouth sores. If present she can contact our clinic.   2. Iron deficiency anemia -Patient evaluated in the past by Dr. Lamonte Sakai here at [facility] as well as her Gastroenterologist -Etiology reportedly unclear as to her iron deficiency. S/P hysterectomy -Currently managed by PCP, Dr. Joylene Draft.  -She receives iron infusions with Feraheme PRN at an outside clinic -Discussed we are more than happy to monitor her iron here as well as arrange for iron infusions if needed -Most recent iron infusions June/July 2021.  -Hg 11.7 today (02/04/20). Ferritin and iron panel still pending.   3. Diabetes Mellitis  -Followed by her PCP -A1c still pending today   PLAN: -Copy note to Dr Dema Severin to see if she still need to see him this week for f/u  -Labs reviewed and adequate to proceed  with week 1 Mitomycin and 5FU today  -pump d/c and PICC line removed on day 5  -Continue Radiation  -Lab and f/u with me or APP every Monday X5     No problem-specific Assessment & Plan notes found for this encounter.   No orders of the defined types were placed in this encounter.  All questions were answered. The patient knows to call the clinic with any problems, questions or concerns. No barriers to  learning was detected. The total time spent in the appointment was 30 minutes.     Truitt Merle, MD 02/04/2020   I, Joslyn Devon, am acting as scribe for Truitt Merle, MD.   I have reviewed the above documentation for accuracy and completeness, and I agree with the above.

## 2020-02-01 NOTE — Telephone Encounter (Signed)
-----   Message from Truitt Merle, MD sent at 01/31/2020  7:51 AM EDT ----- Please let pt know her PET scan results, no node or distant metastasis, probable residual disease at anus, no other concerns, plan to start chemoRT next Monday, thanks   Truitt Merle

## 2020-02-04 ENCOUNTER — Other Ambulatory Visit: Payer: Self-pay

## 2020-02-04 ENCOUNTER — Inpatient Hospital Stay: Payer: BC Managed Care – PPO

## 2020-02-04 ENCOUNTER — Inpatient Hospital Stay (HOSPITAL_BASED_OUTPATIENT_CLINIC_OR_DEPARTMENT_OTHER): Payer: BC Managed Care – PPO | Admitting: Hematology

## 2020-02-04 ENCOUNTER — Other Ambulatory Visit: Payer: Self-pay | Admitting: Physician Assistant

## 2020-02-04 ENCOUNTER — Ambulatory Visit (HOSPITAL_COMMUNITY)
Admission: RE | Admit: 2020-02-04 | Discharge: 2020-02-04 | Disposition: A | Discharge status: Home / Self Care | Payer: BC Managed Care – PPO | Source: Ambulatory Visit | Attending: Physician Assistant | Admitting: Physician Assistant

## 2020-02-04 ENCOUNTER — Encounter: Payer: Self-pay | Admitting: General Practice

## 2020-02-04 ENCOUNTER — Encounter: Payer: Self-pay | Admitting: Hematology

## 2020-02-04 ENCOUNTER — Ambulatory Visit
Admission: RE | Admit: 2020-02-04 | Discharge: 2020-02-04 | Disposition: A | Discharge status: Home / Self Care | Payer: BC Managed Care – PPO | Source: Ambulatory Visit | Attending: Radiation Oncology | Admitting: Radiation Oncology

## 2020-02-04 VITALS — BP 130/81 | HR 70 | Temp 98.2°F | Resp 18

## 2020-02-04 DIAGNOSIS — C210 Malignant neoplasm of anus, unspecified: Secondary | ICD-10-CM | POA: Insufficient documentation

## 2020-02-04 DIAGNOSIS — Z452 Encounter for adjustment and management of vascular access device: Secondary | ICD-10-CM | POA: Diagnosis not present

## 2020-02-04 DIAGNOSIS — E119 Type 2 diabetes mellitus without complications: Secondary | ICD-10-CM | POA: Diagnosis not present

## 2020-02-04 DIAGNOSIS — Z95828 Presence of other vascular implants and grafts: Secondary | ICD-10-CM

## 2020-02-04 DIAGNOSIS — Z5111 Encounter for antineoplastic chemotherapy: Secondary | ICD-10-CM | POA: Diagnosis not present

## 2020-02-04 DIAGNOSIS — Z510 Encounter for antineoplastic radiation therapy: Secondary | ICD-10-CM | POA: Diagnosis not present

## 2020-02-04 DIAGNOSIS — D509 Iron deficiency anemia, unspecified: Secondary | ICD-10-CM | POA: Diagnosis not present

## 2020-02-04 DIAGNOSIS — Z9049 Acquired absence of other specified parts of digestive tract: Secondary | ICD-10-CM | POA: Diagnosis not present

## 2020-02-04 LAB — CMP (CANCER CENTER ONLY)
ALT: 28 U/L (ref 0–44)
AST: 19 U/L (ref 15–41)
Albumin: 4 g/dL (ref 3.5–5.0)
Alkaline Phosphatase: 49 U/L (ref 38–126)
Anion gap: 7 (ref 5–15)
BUN: 21 mg/dL (ref 8–23)
CO2: 29 mmol/L (ref 22–32)
Calcium: 10.6 mg/dL — ABNORMAL HIGH (ref 8.9–10.3)
Chloride: 103 mmol/L (ref 98–111)
Creatinine: 0.85 mg/dL (ref 0.44–1.00)
GFR, Est AFR Am: >60 mL/min (ref >60)
GFR, Estimated: >60 mL/min (ref >60)
Glucose, Bld: 149 mg/dL — ABNORMAL HIGH (ref 70–99)
Potassium: 4.3 mmol/L (ref 3.5–5.1)
Sodium: 139 mmol/L (ref 135–145)
Total Bilirubin: 0.2 mg/dL — ABNORMAL LOW (ref 0.3–1.2)
Total Protein: 7.3 g/dL (ref 6.5–8.1)

## 2020-02-04 LAB — CBC WITH DIFFERENTIAL (CANCER CENTER ONLY)
Abs Immature Granulocytes: 0.02 10*3/uL (ref 0.00–0.07)
Basophils Absolute: 0.1 10*3/uL (ref 0.0–0.1)
Basophils Relative: 1 %
Eosinophils Absolute: 0.3 10*3/uL (ref 0.0–0.5)
Eosinophils Relative: 5 %
HCT: 37.9 % (ref 36.0–46.0)
Hemoglobin: 11.7 g/dL — ABNORMAL LOW (ref 12.0–15.0)
Immature Granulocytes: 0 %
Lymphocytes Relative: 32 %
Lymphs Abs: 1.7 10*3/uL (ref 0.7–4.0)
MCH: 25.1 pg — ABNORMAL LOW (ref 26.0–34.0)
MCHC: 30.9 g/dL (ref 30.0–36.0)
MCV: 81.3 fL (ref 80.0–100.0)
Monocytes Absolute: 0.4 10*3/uL (ref 0.1–1.0)
Monocytes Relative: 7 %
Neutro Abs: 2.8 10*3/uL (ref 1.7–7.7)
Neutrophils Relative %: 55 %
Platelet Count: 271 10*3/uL (ref 150–400)
RBC: 4.66 MIL/uL (ref 3.87–5.11)
RDW: 20 % — ABNORMAL HIGH (ref 11.5–15.5)
WBC Count: 5.2 10*3/uL (ref 4.0–10.5)
nRBC: 0 % (ref 0.0–0.2)

## 2020-02-04 LAB — HEMOGLOBIN A1C
Hgb A1c MFr Bld: 5.7 % — ABNORMAL HIGH (ref 4.8–5.6)
Mean Plasma Glucose: 116.89 mg/dL

## 2020-02-04 LAB — IRON AND TIBC
Iron: 47 ug/dL (ref 41–142)
Saturation Ratios: 12 % — ABNORMAL LOW (ref 21–57)
TIBC: 398 ug/dL (ref 236–444)
UIBC: 350 ug/dL (ref 120–384)

## 2020-02-04 LAB — FERRITIN: Ferritin: 20 ng/mL (ref 11–307)

## 2020-02-04 MED ORDER — LIDOCAINE HCL 1 % IJ SOLN
INTRAMUSCULAR | Status: AC
  Filled 2020-02-04: qty 20

## 2020-02-04 MED ORDER — PROCHLORPERAZINE MALEATE 10 MG PO TABS
10.0000 mg | ORAL_TABLET | Freq: Once | ORAL | Status: AC
  Administered 2020-02-04: 10 mg via ORAL

## 2020-02-04 MED ORDER — SODIUM CHLORIDE 0.9 % IV SOLN
Freq: Once | INTRAVENOUS | Status: AC
  Filled 2020-02-04: qty 250

## 2020-02-04 MED ORDER — SODIUM CHLORIDE 0.9% FLUSH
10.0000 mL | INTRAVENOUS | Status: DC | PRN
  Filled 2020-02-04: qty 10

## 2020-02-04 MED ORDER — SODIUM CHLORIDE 0.9% FLUSH
10.0000 mL | INTRAVENOUS | Status: DC | PRN
  Administered 2020-02-04: 10 mL via INTRAVENOUS
  Filled 2020-02-04: qty 10

## 2020-02-04 MED ORDER — MITOMYCIN CHEMO IV INJECTION 20 MG
10.0000 mg/m2 | Freq: Once | INTRAVENOUS | Status: AC
  Administered 2020-02-04: 18.5 mg via INTRAVENOUS
  Filled 2020-02-04: qty 37

## 2020-02-04 MED ORDER — SODIUM CHLORIDE 0.9 % IV SOLN
1000.0000 mg/m2/d | INTRAVENOUS | Status: DC
  Administered 2020-02-04: 7450 mg via INTRAVENOUS
  Filled 2020-02-04: qty 149

## 2020-02-04 MED ORDER — PROCHLORPERAZINE MALEATE 10 MG PO TABS
ORAL_TABLET | ORAL | Status: AC
  Filled 2020-02-04: qty 1

## 2020-02-04 NOTE — Patient Instructions (Addendum)
Melinda [name] Discharge Instructions for Patients Receiving Chemotherapy  Today you received the following chemotherapy agents Mitomycin and Fluorouracil  To help prevent nausea and vomiting after your treatment, we encourage you to take your nausea medication as directed.  If you develop nausea and vomiting that is not controlled by your nausea medication, call the clinic.   BELOW ARE SYMPTOMS THAT SHOULD BE REPORTED IMMEDIATELY:  *FEVER GREATER THAN 100.5 F  *CHILLS WITH OR WITHOUT FEVER  NAUSEA AND VOMITING THAT IS NOT CONTROLLED WITH YOUR NAUSEA MEDICATION  *UNUSUAL SHORTNESS OF BREATH  *UNUSUAL BRUISING OR BLEEDING  TENDERNESS IN MOUTH AND THROAT WITH OR WITHOUT PRESENCE OF ULCERS  *URINARY PROBLEMS  *BOWEL PROBLEMS  UNUSUAL RASH Items with * indicate a potential emergency and should be followed up as soon as possible.  Feel free to call the clinic should you have any questions or concerns. The clinic phone number is [phone].  Please show the [place] at check-in to the Emergency Department and triage nurse.  Mitomycin injection What is this medicine? MITOMYCIN (mye toe MYE sin) is a chemotherapy drug. This medicine is used to treat cancer of the stomach and pancreas. This medicine may be used for other purposes; ask your health care provider or pharmacist if you have questions. COMMON BRAND NAME(S): Mutamycin What should I tell my health care provider before I take this medicine? They need to know if you have any of these conditions:  bleeding disorders  infection (especially a viral infection such as chickenpox, cold sores, or herpes)  low blood counts, like white cells, platelets, or red blood cells  kidney disease  an unusual or allergic reaction to mitomycin, other medicines, foods, dyes, or preservatives  pregnant or trying to get pregnant  breast-feeding How should I use this medicine? This drug is given as an injection or  infusion into a vein. It is administered in a hospital or clinic by a specially trained health care professional. Talk to your pediatrician regarding the use of this medicine in children. Special care may be needed. Overdosage: If you think you have taken too much of this medicine contact a poison control center or emergency room at once. NOTE: This medicine is only for you. Do not share this medicine with others. What if I miss a dose? It is important not to miss your dose. Call your doctor or health care professional if you are unable to keep an appointment. What may interact with this medicine? Interactions are not expected. This list may not describe all possible interactions. Give your health care provider a list of all the medicines, herbs, non-prescription drugs, or dietary supplements you use. Also tell them if you smoke, drink alcohol, or use illegal drugs. Some items may interact with your medicine. What should I watch for while using this medicine? Your condition will be monitored carefully while you are receiving this medicine. You will need important blood work done while you are taking this medicine. This drug may make you feel generally unwell. This is not uncommon, as chemotherapy can affect healthy cells as well as cancer cells. Report any side effects. Continue your course of treatment even though you feel ill unless your doctor tells you to stop. Call your doctor or health care professional for advice if you get a fever, chills or sore throat, or other symptoms of a cold or flu. Do not treat yourself. This drug decreases your body's ability to fight infections. Try to avoid being around  people who are sick. This medicine may increase your risk to bruise or bleed. Call your doctor or health care professional if you notice any unusual bleeding. Be careful brushing and flossing your teeth or using a toothpick because you may get an infection or bleed more easily. If you have any dental  work done, tell your dentist you are receiving this medicine. Avoid taking products that contain aspirin, acetaminophen, ibuprofen, naproxen, or ketoprofen unless instructed by your doctor. These medicines may hide a fever. Do not become pregnant while taking this medicine. Women should inform their doctor if they wish to become pregnant or think they might be pregnant. There is a potential for serious side effects to an unborn child. Talk to your health care professional or pharmacist for more information. Do not breast-feed an infant while taking this medicine. What side effects may I notice from receiving this medicine? Side effects that you should report to your doctor or health care professional as soon as possible:  allergic reactions like skin rash, itching or hives, swelling of the face, lips, or tongue  breathing problems  pain, redness, or irritation at site where injected  signs and symptoms of bleeding such as bloody or black, tarry stools; red or dark brown urine; spitting up blood or brown material that looks like coffee grounds; red spots on the skin; unusual bruising or bleeding from the eyes, gums, or nose  signs and symptoms of infection like fever; chills; cough; sore throat; pain or trouble passing urine  signs and symptoms of kidney injury like trouble passing urine or change in the amount of urine  signs and symptoms of low red blood cells or anemia such as unusually weak or tired; feeling faint or lightheaded; falls; breathing problems Side effects that usually do not require medical attention (report to your doctor or health care professional if they continue or are bothersome):  green to blue color of urine  hair loss  loss of appetite  mouth sores  nausea, vomiting This list may not describe all possible side effects. Call your doctor for medical advice about side effects. You may report side effects to FDA at 1-800-FDA-1088. Where should I keep my  medicine? This drug is given in a hospital or clinic and will not be stored at home. NOTE: This sheet is a summary. It may not cover all possible information. If you have questions about this medicine, talk to your doctor, pharmacist, or health care provider.  2020 Elsevier/Gold Standard (2019-01-09 16:33:50)  Fluorouracil, 5-FU injection What is this medicine? FLUOROURACIL, 5-FU (flure oh YOOR a sil) is a chemotherapy drug. It slows the growth of cancer cells. This medicine is used to treat many types of cancer like breast cancer, colon or rectal cancer, pancreatic cancer, and stomach cancer. This medicine may be used for other purposes; ask your health care provider or pharmacist if you have questions. COMMON BRAND NAME(S): Adrucil What should I tell my health care provider before I take this medicine? They need to know if you have any of these conditions:  blood disorders  dihydropyrimidine dehydrogenase (DPD) deficiency  infection (especially a virus infection such as chickenpox, cold sores, or herpes)  kidney disease  liver disease  malnourished, poor nutrition  recent or ongoing radiation therapy  an unusual or allergic reaction to fluorouracil, other chemotherapy, other medicines, foods, dyes, or preservatives  pregnant or trying to get pregnant  breast-feeding How should I use this medicine? This drug is given as an infusion or  injection into a vein. It is administered in a hospital or clinic by a specially trained health care professional. Talk to your pediatrician regarding the use of this medicine in children. Special care may be needed. Overdosage: If you think you have taken too much of this medicine contact a poison control center or emergency room at once. NOTE: This medicine is only for you. Do not share this medicine with others. What if I miss a dose? It is important not to miss your dose. Call your doctor or health care professional if you are unable to keep  an appointment. What may interact with this medicine?  allopurinol  cimetidine  dapsone  digoxin  hydroxyurea  leucovorin  levamisole  medicines for seizures like ethotoin, fosphenytoin, phenytoin  medicines to increase blood counts like filgrastim, pegfilgrastim, sargramostim  medicines that treat or prevent blood clots like warfarin, enoxaparin, and dalteparin  methotrexate  metronidazole  pyrimethamine  some other chemotherapy drugs like busulfan, cisplatin, estramustine, vinblastine  trimethoprim  trimetrexate  vaccines Talk to your doctor or health care professional before taking any of these medicines:  acetaminophen  aspirin  ibuprofen  ketoprofen  naproxen This list may not describe all possible interactions. Give your health care provider a list of all the medicines, herbs, non-prescription drugs, or dietary supplements you use. Also tell them if you smoke, drink alcohol, or use illegal drugs. Some items may interact with your medicine. What should I watch for while using this medicine? Visit your doctor for checks on your progress. This drug may make you feel generally unwell. This is not uncommon, as chemotherapy can affect healthy cells as well as cancer cells. Report any side effects. Continue your course of treatment even though you feel ill unless your doctor tells you to stop. In some cases, you may be given additional medicines to help with side effects. Follow all directions for their use. Call your doctor or health care professional for advice if you get a fever, chills or sore throat, or other symptoms of a cold or flu. Do not treat yourself. This drug decreases your body's ability to fight infections. Try to avoid being around people who are sick. This medicine may increase your risk to bruise or bleed. Call your doctor or health care professional if you notice any unusual bleeding. Be careful brushing and flossing your teeth or using a  toothpick because you may get an infection or bleed more easily. If you have any dental work done, tell your dentist you are receiving this medicine. Avoid taking products that contain aspirin, acetaminophen, ibuprofen, naproxen, or ketoprofen unless instructed by your doctor. These medicines may hide a fever. Do not become pregnant while taking this medicine. Women should inform their doctor if they wish to become pregnant or think they might be pregnant. There is a potential for serious side effects to an unborn child. Talk to your health care professional or pharmacist for more information. Do not breast-feed an infant while taking this medicine. Men should inform their doctor if they wish to father a child. This medicine may lower sperm counts. Do not treat diarrhea with over the counter products. Contact your doctor if you have diarrhea that lasts more than 2 days or if it is severe and watery. This medicine can make you more sensitive to the sun. Keep out of the sun. If you cannot avoid being in the sun, wear protective clothing and use sunscreen. Do not use sun lamps or tanning beds/booths. What side effects  may I notice from receiving this medicine? Side effects that you should report to your doctor or health care professional as soon as possible:  allergic reactions like skin rash, itching or hives, swelling of the face, lips, or tongue  low blood counts - this medicine may decrease the number of white blood cells, red blood cells and platelets. You may be at increased risk for infections and bleeding.  signs of infection - fever or chills, cough, sore throat, pain or difficulty passing urine  signs of decreased platelets or bleeding - bruising, pinpoint red spots on the skin, black, tarry stools, blood in the urine  signs of decreased red blood cells - unusually weak or tired, fainting spells, lightheadedness  breathing problems  changes in vision  chest pain  mouth sores  nausea  and vomiting  pain, swelling, redness at site where injected  pain, tingling, numbness in the hands or feet  redness, swelling, or sores on hands or feet  stomach pain  unusual bleeding Side effects that usually do not require medical attention (report to your doctor or health care professional if they continue or are bothersome):  changes in finger or toe nails  diarrhea  dry or itchy skin  hair loss  headache  loss of appetite  sensitivity of eyes to the light  stomach upset  unusually teary eyes This list may not describe all possible side effects. Call your doctor for medical advice about side effects. You may report side effects to FDA at 1-800-FDA-1088. Where should I keep my medicine? This drug is given in a hospital or clinic and will not be stored at home. NOTE: This sheet is a summary. It may not cover all possible information. If you have questions about this medicine, talk to your doctor, pharmacist, or health care provider.  2020 Elsevier/Gold Standard (2007-09-27 13:53:16)

## 2020-02-04 NOTE — Patient Instructions (Signed)

## 2020-02-04 NOTE — Progress Notes (Signed)
[facility] Spiritual Care Note  Followed up with Melinda [name] in infusion as planned. Brought a bone pillow and coordinating prayer shawl as tangible signs of comfort and encouragement, for which she was very grateful. Having exclusively positive, upbeat encounters with staff throughout her busy day today also helped put her at ease. She knows to reach out anytime, and we plan to follow up at a future treatment.   [place], [place], [facility] Pager [phone] Voicemail [phone]

## 2020-02-04 NOTE — Procedures (Signed)
PROCEDURE SUMMARY:  Successful placement of image-guided single lumen PICC line to the right basilic vein. Length 34 cm. Tip at lower SVC/RA. No complications. EBL = <5 ml Ready for use.  Please see imaging section of Epic for full dictation.   Theresa Duty, NP 02/04/2020 1:36 PM

## 2020-02-05 ENCOUNTER — Ambulatory Visit
Admission: RE | Admit: 2020-02-05 | Discharge: 2020-02-05 | Disposition: A | Discharge status: Home / Self Care | Payer: BC Managed Care – PPO | Source: Ambulatory Visit | Attending: Radiation Oncology | Admitting: Radiation Oncology

## 2020-02-05 ENCOUNTER — Ambulatory Visit: Payer: BC Managed Care – PPO | Admitting: Radiation Oncology

## 2020-02-05 ENCOUNTER — Ambulatory Visit: Payer: BC Managed Care – PPO

## 2020-02-05 DIAGNOSIS — Z510 Encounter for antineoplastic radiation therapy: Secondary | ICD-10-CM | POA: Diagnosis not present

## 2020-02-05 DIAGNOSIS — C210 Malignant neoplasm of anus, unspecified: Secondary | ICD-10-CM | POA: Diagnosis not present

## 2020-02-06 ENCOUNTER — Telehealth: Payer: Self-pay | Admitting: Hematology

## 2020-02-06 ENCOUNTER — Ambulatory Visit
Admission: RE | Admit: 2020-02-06 | Discharge: 2020-02-06 | Disposition: A | Discharge status: Home / Self Care | Payer: BC Managed Care – PPO | Source: Ambulatory Visit | Attending: Radiation Oncology | Admitting: Radiation Oncology

## 2020-02-06 ENCOUNTER — Other Ambulatory Visit: Payer: Self-pay

## 2020-02-06 ENCOUNTER — Telehealth: Payer: Self-pay

## 2020-02-06 DIAGNOSIS — C210 Malignant neoplasm of anus, unspecified: Secondary | ICD-10-CM | POA: Diagnosis not present

## 2020-02-06 DIAGNOSIS — Z510 Encounter for antineoplastic radiation therapy: Secondary | ICD-10-CM | POA: Insufficient documentation

## 2020-02-06 NOTE — Telephone Encounter (Signed)
Scheduled per 8/30 los. Pt is aware of appt times and dates.

## 2020-02-06 NOTE — Telephone Encounter (Signed)
Melinda [name] called stating she is experiencing intermittent pain at the picc site. No edema.  I told her to apply warm compress to the area throughout the day.  She also stated she has a h/a and has taken tylenol with some relief.

## 2020-02-07 ENCOUNTER — Other Ambulatory Visit: Payer: Self-pay

## 2020-02-07 ENCOUNTER — Ambulatory Visit
Admission: RE | Admit: 2020-02-07 | Discharge: 2020-02-07 | Disposition: A | Discharge status: Home / Self Care | Payer: BC Managed Care – PPO | Source: Ambulatory Visit | Attending: Radiation Oncology | Admitting: Radiation Oncology

## 2020-02-07 DIAGNOSIS — C210 Malignant neoplasm of anus, unspecified: Secondary | ICD-10-CM | POA: Diagnosis not present

## 2020-02-07 DIAGNOSIS — Z510 Encounter for antineoplastic radiation therapy: Secondary | ICD-10-CM | POA: Diagnosis not present

## 2020-02-07 NOTE — Progress Notes (Signed)
Pt here for patient teaching.  Pt given Radiation and You booklet and skin care instructions.  Reviewed areas of pertinence such as diarrhea, fatigue, hair loss, nausea and vomiting, sexual and fertility changes, skin changes and urinary and bladder changes . Pt able to give teach back of to pat skin, use unscented/gentle soap, use baby wipes, have Imodium on hand, drink plenty of water and sitz bath,avoid applying anything to skin within 4 hours of treatment. Pt verbalizes understanding of information given and will contact nursing with any questions or concerns.     Ziair Penson M. Cinsere Mizrahi RN, BSN      

## 2020-02-08 ENCOUNTER — Telehealth: Payer: Self-pay

## 2020-02-08 ENCOUNTER — Inpatient Hospital Stay: Payer: BC Managed Care – PPO | Attending: Physician Assistant

## 2020-02-08 ENCOUNTER — Other Ambulatory Visit: Payer: Self-pay | Admitting: Hematology

## 2020-02-08 ENCOUNTER — Other Ambulatory Visit: Payer: Self-pay

## 2020-02-08 ENCOUNTER — Ambulatory Visit
Admission: RE | Admit: 2020-02-08 | Discharge: 2020-02-08 | Disposition: A | Discharge status: Home / Self Care | Payer: BC Managed Care – PPO | Source: Ambulatory Visit | Attending: Radiation Oncology | Admitting: Radiation Oncology

## 2020-02-08 VITALS — BP 117/69 | HR 73 | Temp 98.7°F | Resp 18

## 2020-02-08 DIAGNOSIS — D709 Neutropenia, unspecified: Secondary | ICD-10-CM | POA: Diagnosis not present

## 2020-02-08 DIAGNOSIS — E119 Type 2 diabetes mellitus without complications: Secondary | ICD-10-CM | POA: Insufficient documentation

## 2020-02-08 DIAGNOSIS — Z7984 Long term (current) use of oral hypoglycemic drugs: Secondary | ICD-10-CM | POA: Diagnosis not present

## 2020-02-08 DIAGNOSIS — D509 Iron deficiency anemia, unspecified: Secondary | ICD-10-CM | POA: Insufficient documentation

## 2020-02-08 DIAGNOSIS — C218 Malignant neoplasm of overlapping sites of rectum, anus and anal canal: Secondary | ICD-10-CM | POA: Insufficient documentation

## 2020-02-08 DIAGNOSIS — R197 Diarrhea, unspecified: Secondary | ICD-10-CM | POA: Insufficient documentation

## 2020-02-08 DIAGNOSIS — C210 Malignant neoplasm of anus, unspecified: Secondary | ICD-10-CM | POA: Diagnosis not present

## 2020-02-08 DIAGNOSIS — Z510 Encounter for antineoplastic radiation therapy: Secondary | ICD-10-CM | POA: Diagnosis not present

## 2020-02-08 DIAGNOSIS — D500 Iron deficiency anemia secondary to blood loss (chronic): Secondary | ICD-10-CM

## 2020-02-08 DIAGNOSIS — Z5111 Encounter for antineoplastic chemotherapy: Secondary | ICD-10-CM | POA: Diagnosis not present

## 2020-02-08 MED ORDER — DIPHENHYDRAMINE HCL 25 MG PO CAPS: 50.0000 mg | ORAL_CAPSULE | Freq: Once | ORAL | Status: DC

## 2020-02-08 MED ORDER — SODIUM CHLORIDE 0.9 % IV SOLN
510.0000 mg | Freq: Once | INTRAVENOUS | Status: AC
  Administered 2020-02-08: 510 mg via INTRAVENOUS
  Filled 2020-02-08: qty 510

## 2020-02-08 MED ORDER — SODIUM CHLORIDE 0.9 % IV SOLN
Freq: Once | INTRAVENOUS | Status: AC
  Filled 2020-02-08: qty 250

## 2020-02-08 MED ORDER — ACETAMINOPHEN 325 MG PO TABS: 650.0000 mg | ORAL_TABLET | Freq: Once | ORAL | Status: DC

## 2020-02-08 MED ORDER — HEPARIN SOD (PORK) LOCK FLUSH 100 UNIT/ML IV SOLN
500.0000 [IU] | Freq: Once | INTRAVENOUS | Status: DC | PRN
  Filled 2020-02-08: qty 5

## 2020-02-08 MED ORDER — SODIUM CHLORIDE 0.9% FLUSH
10.0000 mL | INTRAVENOUS | Status: DC | PRN
  Filled 2020-02-08: qty 10

## 2020-02-08 NOTE — Patient Instructions (Signed)
Ferumoxytol injection What is this medicine? FERUMOXYTOL is an iron complex. Iron is used to make healthy red blood cells, which carry oxygen and nutrients throughout the body. This medicine is used to treat iron deficiency anemia. This medicine may be used for other purposes; ask your health care provider or pharmacist if you have questions. COMMON BRAND NAME(S): Feraheme What should I tell my health care provider before I take this medicine? They need to know if you have any of these conditions:  anemia not caused by low iron levels  high levels of iron in the blood  magnetic resonance imaging (MRI) test scheduled  an unusual or allergic reaction to iron, other medicines, foods, dyes, or preservatives  pregnant or trying to get pregnant  breast-feeding How should I use this medicine? This medicine is for injection into a vein. It is given by a health care professional in a hospital or clinic setting. Talk to your pediatrician regarding the use of this medicine in children. Special care may be needed. Overdosage: If you think you have taken too much of this medicine contact a poison control center or emergency room at once. NOTE: This medicine is only for you. Do not share this medicine with others. What if I miss a dose? It is important not to miss your dose. Call your doctor or health care professional if you are unable to keep an appointment. What may interact with this medicine? This medicine may interact with the following medications:  other iron products This list may not describe all possible interactions. Give your health care provider a list of all the medicines, herbs, non-prescription drugs, or dietary supplements you use. Also tell them if you smoke, drink alcohol, or use illegal drugs. Some items may interact with your medicine. What should I watch for while using this medicine? Visit your doctor or healthcare professional regularly. Tell your doctor or healthcare  professional if your symptoms do not start to get better or if they get worse. You may need blood work done while you are taking this medicine. You may need to follow a special diet. Talk to your doctor. Foods that contain iron include: whole grains/cereals, dried fruits, beans, or peas, leafy green vegetables, and organ meats (liver, kidney). What side effects may I notice from receiving this medicine? Side effects that you should report to your doctor or health care professional as soon as possible:  allergic reactions like skin rash, itching or hives, swelling of the face, lips, or tongue  breathing problems  changes in blood pressure  feeling faint or lightheaded, falls  fever or chills  flushing, sweating, or hot feelings  swelling of the ankles or feet Side effects that usually do not require medical attention (report to your doctor or health care professional if they continue or are bothersome):  diarrhea  headache  nausea, vomiting  stomach pain This list may not describe all possible side effects. Call your doctor for medical advice about side effects. You may report side effects to FDA at 1-800-FDA-1088. Where should I keep my medicine? This drug is given in a hospital or clinic and will not be stored at home. NOTE: This sheet is a summary. It may not cover all possible information. If you have questions about this medicine, talk to your doctor, pharmacist, or health care provider.  2020 Elsevier/Gold Standard (2016-07-12 20:21:10) PICC Removal, Adult, Care After This sheet gives you information about how to care for yourself after your procedure. Your health care provider may  also give you more specific instructions. If you have problems or questions, contact your health care provider. What can I expect after the procedure? After your procedure, it is common to have:  Tenderness or soreness.  Redness, swelling, or a scab where the PICC was removed (exit  site). Follow these instructions at home: For the first 24 hours after the procedure   Keep the bandage (dressing) on the exit site clean and dry. Do not remove the dressing until your health care provider tells you to do so.  Check your arm often for signs and symptoms of an infection. Check for: ? A red streak that spreads away from the dressing. ? Blood or fluid that you can see on the dressing. ? More redness or swelling.  Do not lift anything heavy or do activities that require great effort until your health care provider says it is okay. You should avoid: ? Lifting weights. ? Yard work. ? Any physical activity with repetitive arm movement.  Watch closely for any signs of an air bubble in the vein (air embolism). This is a rare but serious complication. If you have signs of air embolism, call 911 immediately and lie down on your left side to keep the air from moving into the lungs. Signs of an air embolism include: ? Difficulty breathing. ? Chest pain. ? Coughing or wheezing. ? Skin that is pale, blue, cold, or clammy. ? Rapid pulse. ? Rapid breathing. ? Fainting. After 24 Hours have passed:  Remove your dressing as told by your health care provider. Make sure you wash your hands with soap and water before and after you change the dressing. If soap and water are not available, use hand sanitizer.  Return to your normal activities as told by your health care provider.  A small scab may develop over the exit site. Do not pick at the scab.  When bathing or showering, gently wash the exit site with soap and water. Pat it dry.  Watch for signs of infection, such as: ? Fever or chills. ? Swollen glands under the arm. ? More redness, swelling, or soreness in the arm. ? Blood, fluid, or pus coming from the exit site. ? Warmth or a bad smell at the exit site. ? A red streak spreading away from the exit site. General instructions  Take over-the-counter and prescription  medicines only as told by your health care provider. Do not take any new medicines without checking with your health care provider first.  If you were prescribed an antibiotic medicine, apply or take it as told by your health care provider. Do not stop using the antibiotic even if your condition improves.  Keep all follow-up visits as told by your health care provider. This is important. Contact a health care provider if:  You have a fever or chills.  You have soreness, redness, or swelling on your exit site, and it gets worse.  You have swollen glands under your arm.  You have any of the following symptoms at your exit site: ? Blood, fluid, or pus. ? Unusual warmth. ? A bad smell. ? A red streak spreading away from the exit site. Get help right away if:  You have numbness or tingling in your fingers, hand, or arm.  Your arm looks blue and feels cold.  You have signs of an air embolism, such as: ? Difficulty breathing. ? Chest pain. ? Coughing or wheezing. ? Skin that is pale, blue, cold, or clammy. ? Rapid pulse. ?  Rapid breathing. ? Fainting. These symptoms may represent a serious problem that is an emergency. Do not wait to see if the symptoms will go away. Get medical help right away. Call your local emergency services (911 in the [place] Do not drive yourself to the hospital. Summary  After your procedure, it is common to have tenderness or soreness, redness, swelling, or a scab at the exit site.  Keep the dressing over the exit site clean and dry. Do not remove the dressing until your health care provider tells you to do so.  Do not lift anything heavy or do activities that require great effort until your health care provider says it is okay.  Watch closely for any signs of an air embolism. If you have signs of air embolism, call 911 immediately and lie down on your left side. This information is not intended to replace advice given to you by your health care provider.  Make sure you discuss any questions you have with your health care provider. Document Revised: 05/06/2017 Document Reviewed: 07/20/2016 Elsevier Patient Education  2020 [employer].

## 2020-02-08 NOTE — Progress Notes (Signed)
Pt here today for Chemo Pump D/c and Picc line removal. PiCC insertion site cleaned and line  removed. Length verified at 34 cm. Pressure was applied with Clovis Cao.   Patient waited her 30 minutes post picc removal with no indication of continual bleeding.

## 2020-02-08 NOTE — Telephone Encounter (Signed)
I spoke with Melinda [name] she is agreeable to get feraheme this afternoon.  I spoke with [name] and infusion is able to give her feraheme today.

## 2020-02-08 NOTE — Telephone Encounter (Signed)
-----   Message from Truitt Merle, MD sent at 02/05/2020  9:18 AM EDT ----- Please let pt know her lab results, iron level improved but still on low side, please arrange one more dose feraheme this Friday with pump d/c or sometime next week around her radiation appointment, thanks   Truitt Merle  02/05/2020

## 2020-02-09 ENCOUNTER — Other Ambulatory Visit: Payer: Self-pay | Admitting: Hematology

## 2020-02-09 DIAGNOSIS — C210 Malignant neoplasm of anus, unspecified: Secondary | ICD-10-CM

## 2020-02-12 ENCOUNTER — Inpatient Hospital Stay (HOSPITAL_BASED_OUTPATIENT_CLINIC_OR_DEPARTMENT_OTHER): Payer: BC Managed Care – PPO | Admitting: Hematology

## 2020-02-12 ENCOUNTER — Encounter: Payer: Self-pay | Admitting: Hematology

## 2020-02-12 ENCOUNTER — Ambulatory Visit
Admission: RE | Admit: 2020-02-12 | Discharge: 2020-02-12 | Disposition: A | Discharge status: Home / Self Care | Payer: BC Managed Care – PPO | Source: Ambulatory Visit | Attending: Radiation Oncology | Admitting: Radiation Oncology

## 2020-02-12 ENCOUNTER — Other Ambulatory Visit: Payer: Self-pay

## 2020-02-12 ENCOUNTER — Inpatient Hospital Stay: Payer: BC Managed Care – PPO

## 2020-02-12 VITALS — BP 128/81 | HR 93 | Temp 97.5°F | Resp 18 | Ht 66.0 in | Wt 161.8 lb

## 2020-02-12 DIAGNOSIS — Z7984 Long term (current) use of oral hypoglycemic drugs: Secondary | ICD-10-CM | POA: Diagnosis not present

## 2020-02-12 DIAGNOSIS — D509 Iron deficiency anemia, unspecified: Secondary | ICD-10-CM | POA: Diagnosis not present

## 2020-02-12 DIAGNOSIS — Z510 Encounter for antineoplastic radiation therapy: Secondary | ICD-10-CM | POA: Diagnosis not present

## 2020-02-12 DIAGNOSIS — D709 Neutropenia, unspecified: Secondary | ICD-10-CM | POA: Diagnosis not present

## 2020-02-12 DIAGNOSIS — C210 Malignant neoplasm of anus, unspecified: Secondary | ICD-10-CM

## 2020-02-12 DIAGNOSIS — C218 Malignant neoplasm of overlapping sites of rectum, anus and anal canal: Secondary | ICD-10-CM | POA: Diagnosis not present

## 2020-02-12 DIAGNOSIS — D500 Iron deficiency anemia secondary to blood loss (chronic): Secondary | ICD-10-CM | POA: Diagnosis not present

## 2020-02-12 DIAGNOSIS — R197 Diarrhea, unspecified: Secondary | ICD-10-CM | POA: Diagnosis not present

## 2020-02-12 DIAGNOSIS — Z5111 Encounter for antineoplastic chemotherapy: Secondary | ICD-10-CM | POA: Diagnosis not present

## 2020-02-12 DIAGNOSIS — E119 Type 2 diabetes mellitus without complications: Secondary | ICD-10-CM | POA: Diagnosis not present

## 2020-02-12 LAB — CBC WITH DIFFERENTIAL (CANCER CENTER ONLY)
Abs Immature Granulocytes: 0.01 10*3/uL (ref 0.00–0.07)
Basophils Absolute: 0 10*3/uL (ref 0.0–0.1)
Basophils Relative: 2 %
Eosinophils Absolute: 0 10*3/uL (ref 0.0–0.5)
Eosinophils Relative: 2 %
HCT: 38.5 % (ref 36.0–46.0)
Hemoglobin: 12.2 g/dL (ref 12.0–15.0)
Immature Granulocytes: 1 %
Lymphocytes Relative: 33 %
Lymphs Abs: 0.6 10*3/uL — ABNORMAL LOW (ref 0.7–4.0)
MCH: 24.9 pg — ABNORMAL LOW (ref 26.0–34.0)
MCHC: 31.7 g/dL (ref 30.0–36.0)
MCV: 78.6 fL — ABNORMAL LOW (ref 80.0–100.0)
Monocytes Absolute: 0.1 10*3/uL (ref 0.1–1.0)
Monocytes Relative: 6 %
Neutro Abs: 1.1 10*3/uL — ABNORMAL LOW (ref 1.7–7.7)
Neutrophils Relative %: 56 %
Platelet Count: 175 10*3/uL (ref 150–400)
RBC: 4.9 MIL/uL (ref 3.87–5.11)
RDW: 18.4 % — ABNORMAL HIGH (ref 11.5–15.5)
WBC Count: 2 10*3/uL — ABNORMAL LOW (ref 4.0–10.5)
nRBC: 0 % (ref 0.0–0.2)

## 2020-02-12 LAB — CMP (CANCER CENTER ONLY)
ALT: 33 U/L (ref 0–44)
AST: 20 U/L (ref 15–41)
Albumin: 4.1 g/dL (ref 3.5–5.0)
Alkaline Phosphatase: 49 U/L (ref 38–126)
Anion gap: 8 (ref 5–15)
BUN: 23 mg/dL (ref 8–23)
CO2: 24 mmol/L (ref 22–32)
Calcium: 9.9 mg/dL (ref 8.9–10.3)
Chloride: 106 mmol/L (ref 98–111)
Creatinine: 0.92 mg/dL (ref 0.44–1.00)
GFR, Est AFR Am: >60 mL/min (ref >60)
GFR, Estimated: >60 mL/min (ref >60)
Glucose, Bld: 160 mg/dL — ABNORMAL HIGH (ref 70–99)
Potassium: 4.1 mmol/L (ref 3.5–5.1)
Sodium: 138 mmol/L (ref 135–145)
Total Bilirubin: 0.4 mg/dL (ref 0.3–1.2)
Total Protein: 7.6 g/dL (ref 6.5–8.1)

## 2020-02-12 LAB — IRON AND TIBC
Iron: 483 ug/dL — ABNORMAL HIGH (ref 41–142)
Saturation Ratios: 139 % — ABNORMAL HIGH (ref 21–57)
TIBC: 349 ug/dL (ref 236–444)
UIBC: UNDETERMINED ug/dL (ref 120–384)

## 2020-02-12 LAB — FERRITIN: Ferritin: 645 ng/mL — ABNORMAL HIGH (ref 11–307)

## 2020-02-12 NOTE — Progress Notes (Signed)
[place]   [phone] Fax:(336) [phone]   Clinic Follow up Note   Patient Care Team: Patient, No Pcp Per as PCP - General ([place]) Fay Records, MD as PCP - Cardiology (Cardiology) Nobie Putnam, MD (Hematology and Oncology) Heilingoetter, Tobe Sos, PA-C as Physician Assistant (Oncology) Truitt Merle, MD as Consulting Physician (Oncology) Jonnie Finner, RN as Oncology Nurse Navigator  Date of Service:  02/12/2020  CHIEF COMPLAINT: F/u of Squamous cell carcinoma of the anus  SUMMARY OF ONCOLOGIC HISTORY: Oncology History Overview Note  Cancer Staging No matching staging information was found for the patient.    Squamous cell cancer, anus ([place])  07/13/2019 Procedure    EUA, Excision of posterior internal/external hemorrhoid under the care of Dr. Hassell Done    07/13/2019 Pathology Results   - Invasive moderately differentiated squamous cell carcinoma, 1.4 cm.  See comment  - Carcinoma invades for depth of 0.4 cm  - Deep resection margin is negative for carcinoma (0.2 cm)  - Lateral mucosal margin is positive for high-grade dysplasia  - No evidence of lymphovascular perineural invasion  Procedure: Local excision  Tumor Site: Anal canal  Tumor Size: 1.4 cm  Histologic Type: Invasive squamous cell carcinoma  Histologic Grade: G2: Moderately differentiated  Tumor Extension: Carcinoma invades superficial anal sphincter muscle  Margins: Uninvolved by tumor  Treatment Effect: N/A  Regional Lymph Nodes: No lymph nodes submitted or found  Pathologic Stage Classification (pTNM, AJCC 8th Edition):  pT1, pNx  Representative Tumor Block: A1  Comment(s): Lateral mucosal margin is positive for high-grade squamous  dysplasia      07/13/2019 Initial Diagnosis   Squamous cell cancer, anus ([place])   09/04/2019 Imaging   CT scan Chest, Abdomen, and Pelvis   IMPRESSION: 1. No evidence of metastatic disease in the chest, abdomen or pelvis. 2. No  discrete anorectal mass. 3. Chronic findings include: Punctate nonobstructing upper right renal stones and chronic right renal scarring. 4. Aortic Atherosclerosis (ICD10-I70.0).   01/10/2020 Pathology Results   A. ANAL LESION, POSTERIOR MIDLINE, EXCISION:  - Squamous cell carcinoma, moderately differentiated.  Verbally reported by Dr. Saralyn Pilar 1.5 cm, negative margins, depth <1 mm   01/11/2020 Procedure   1. Excision of anal canal lesion (posterior midline) under the care of Dr. Dema Severin     01/28/2020 PET scan   IMPRESSION: 1. Mild focal anal hypermetabolism without discrete mass correlate on the CT images, nonspecific, differential includes postsurgical change or residual anal tumor. 2. No hypermetabolic locoregional or distant metastatic disease. 3. Chronic findings include: Aortic Atherosclerosis (ICD10-I70.0). Diffuse hepatic steatosis. Nonobstructing right nephrolithiasis.     02/04/2020 -  Radiation Therapy   concurrent chemoRT by Dr Lisbeth Renshaw with Mitomycin and 5FU starting 02/04/20.    02/04/2020 -  Chemotherapy   Concurrent chemoRT with Mitomycin and 5FU on week 1 and 5 starting 02/04/20.       CURRENT THERAPY:  Concurrent chemoRT withMitomycin and 5FU on week 1 and 5 starting 02/04/20   INTERVAL HISTORY:  Melinda [name] is here for a follow up and treatment. She presents to the clinic alone. She notes on week 1 day 5 she started having diarrhea 3-4 times a day. She was constipated from her cancer so once her diarrhea started she stopped colase. She did not try imodium. She had mild nausea and did not require antiemetics. From chemo she had taste change. She notes her apprehension about 2nd dose chemo is infection risk.     REVIEW  OF SYSTEMS:   Constitutional: Denies fevers, chills or abnormal weight loss (+) Taste change Eyes: Denies blurriness of vision Ears, nose, mouth, throat, and face: Denies mucositis or sore throat Respiratory: Denies cough, dyspnea or  wheezes Cardiovascular: Denies palpitation, chest discomfort or lower extremity swelling Gastrointestinal:  Denies heartburn (+) Mild diarrhea (+) Nausea  Skin: Denies abnormal skin rashes Lymphatics: Denies new lymphadenopathy or easy bruising Neurological:Denies numbness, tingling or new weaknesses Behavioral/Psych: Mood is stable, no new changes  All other systems were reviewed with the patient and are negative.  MEDICAL HISTORY:  Past Medical History:  Diagnosis Date   Diabetes mellitus type 2, controlled ([place])    Dyslipidemia    untreated   GERD (gastroesophageal reflux disease)    Hemorrhoids    Microcytic anemia 06/22/2012   Nephrolith    Palpitation    positive tilt table test. Prozac and Inderal treatment effective.    SURGICAL HISTORY: Past Surgical History:  Procedure Laterality Date   APPENDECTOMY     BREAST REDUCTION SURGERY     CARDIAC CATHETERIZATION  2002   normal LV function and no significant coronary obstruction   CATARACT EXTRACTION, BILATERAL Bilateral november 2020 and dcember 2020   CYST EXCISION N/A 01/11/2020   Procedure: EXCISION OF ANAL CANAL MASS;  Surgeon: Ileana Roup, MD;  Location: [facility];  Service: General;  Laterality: N/A;   ESOPHAGEAL MANOMETRY N/A 04/04/2017   Procedure: ESOPHAGEAL MANOMETRY (EM);  Surgeon: Laurence Spates, MD;  Location: [facility];  Service: Endoscopy;  Laterality: N/A;   HEMORRHOID SURGERY N/A 07/13/2019   Procedure: HEMORRHOIDECTOMY;  Surgeon: Johnathan Hausen, MD;  Location: [place];  Service: General;  Laterality: N/A;   KIDNEY SURGERY     kidney stones    PARTIAL HYSTERECTOMY     RECTAL EXAM UNDER ANESTHESIA N/A 01/11/2020   Procedure: RECTAL EXAM UNDER ANESTHESIA;  Surgeon: Ileana Roup, MD;  Location: [facility];  Service: General;  Laterality: N/A;   TONSILLECTOMY      I have reviewed the social history and family history with the patient and they are unchanged from  previous note.  ALLERGIES:  is allergic to cephalexin, amoxapine and related, amoxicillin-pot clavulanate, atorvastatin, cefaclor, ciprofloxacin, codeine, doxycycline calcium, fenofibrate micronized, fish oil, flagyl [metronidazole hcl], levofloxacin in d5w, metronidazole, ofloxacin, prednisolone, promethazine hcl, tetracycline hcl, erythromycin, and nitrofurantoin.  MEDICATIONS:  Current Outpatient Medications  Medication Sig Dispense Refill   clorazepate (TRANXENE) 7.5 MG tablet Take 7.5 mg by mouth 2 (two) times daily.  1   CRESTOR 5 MG tablet Take 5 mg by mouth every evening.   1   esomeprazole (NEXIUM) 40 MG capsule Take 40 mg by mouth daily at 12 noon.     FLUoxetine (PROZAC) 20 MG capsule Take 20 mg by mouth every morning.     ibuprofen (ADVIL) 800 MG tablet Take 1 tablet (800 mg total) by mouth every 8 (eight) hours as needed. (Patient not taking: Reported on 01/10/2020) 30 tablet 0   metFORMIN (GLUCOPHAGE) 500 MG tablet Take by mouth 2 (two) times daily with a meal.     methenamine (HIPREX) 1 g tablet Take 0.5 g by mouth daily.      NU-IRON 150 MG capsule Take 150 mg by mouth daily.   2   ondansetron (ZOFRAN) 8 MG tablet Take 1 tablet (8 mg total) by mouth 2 (two) times daily as needed (Nausea or vomiting). 30 tablet 1   polyvinyl alcohol (LIQUIFILM TEARS) 1.4 % ophthalmic solution  Place 1 drop into both eyes as needed for dry eyes.     prochlorperazine (COMPAZINE) 10 MG tablet Take 1 tablet (10 mg total) by mouth every 6 (six) hours as needed (Nausea or vomiting). 30 tablet 1   propranolol (INDERAL) 10 MG tablet TAKE 1 TABLET BY MOUTH TWICE A DAY (Patient taking differently: Take 10 mg by mouth 2 (two) times daily. ) 180 tablet 2   No current facility-administered medications for this visit.    PHYSICAL EXAMINATION: ECOG PERFORMANCE STATUS: 2 - Symptomatic, <50% confined to bed  Vitals:   02/12/20 1229  BP: 128/81  Pulse: 93  Resp: 18  Temp: (!) 97.5 F (36.4 C)   SpO2: 98%   Filed Weights   02/12/20 1229  Weight: 161 lb 12.8 oz (73.4 kg)    GENERAL:alert, no distress and comfortable SKIN: skin color, texture, turgor are normal, no rashes or significant lesions EYES: normal, Conjunctiva are pink and non-injected, sclera clear Musculoskeletal:no cyanosis of digits and no clubbing  NEURO: alert & oriented x 3 with fluent speech, no focal motor/sensory deficits  LABORATORY DATA:  I have reviewed the data as listed CBC Latest Ref Rng & Units 02/12/2020 02/04/2020 01/10/2020  WBC 4.0 - 10.5 K/uL 2.0(L) 5.2 4.3  Hemoglobin 12.0 - 15.0 g/dL 12.2 11.7(L) 11.7(L)  Hematocrit 36 - 46 % 38.5 37.9 39.2  Platelets 150 - 400 K/uL 175 271 248     CMP Latest Ref Rng & Units 02/12/2020 02/04/2020 01/10/2020  Glucose 70 - 99 mg/dL 160(H) 149(H) 114(H)  BUN 8 - 23 mg/dL 23 21 21   Creatinine 0.44 - 1.00 mg/dL 0.92 0.85 0.84  Sodium 135 - 145 mmol/L 138 139 140  Potassium 3.5 - 5.1 mmol/L 4.1 4.3 4.2  Chloride 98 - 111 mmol/L 106 103 104  CO2 22 - 32 mmol/L 24 29 27   Calcium 8.9 - 10.3 mg/dL 9.9 10.6(H) 9.4  Total Protein 6.5 - 8.1 g/dL 7.6 7.3 -  Total Bilirubin 0.3 - 1.2 mg/dL 0.4 0.2(L) -  Alkaline Phos 38 - 126 U/L 49 49 -  AST 15 - 41 U/L 20 19 -  ALT 0 - 44 U/L 33 28 -      RADIOGRAPHIC STUDIES: I have personally reviewed the radiological images as listed and agreed with the findings in the report. No results found.   ASSESSMENT & PLAN:  Melinda [name] is a 59 y.o. female with    1.Recurrent squamous cell carcinoma the anus, pT1N0M0 -She initially presented to Dr Hassell Done fora hemorrhoidectomyand EUA on 07/13/19 with invasive moderately differentiated squamous cell carcinoma, 1.4 cm, pT1, pNx. -She had local recurrence, underwent surgical excision under the care of Dr. Dema Severin on 01/11/20. Surgical path was consistent with squamous cell carcinoma of the anus. 01/28/11 staging PET showed no node or distant metastasis. -I discussed standard treatment  for anal cancer includes concurrent chemoRT for 6 weeks with Mitomycin and 5FU on week 1 and 5. I started her on 02/04/20 -S/p week 1 chemo she has tolerated moderately well with mild diarrhea 3-4 times a day after constipation, mild nausea and taste change. No significant weight loss. I reviewed management with her.  -She is tolerating Radiation well so far.  -Labs reviewed, WBC 2, ANC 1.1, BG 160. I reviewed protection precaution as she is at increased risk of infection, such as fever 100.82F or higher. She can continue Radiation.  -I recommend COVID booster vaccine after chemoRT.  -She is would like to skip her  week 5 chemo if possible. I discussed the importance of cardiovascular chemotherapy, and skipping week 5 chemo will compromise her cancer outcome.  I encouraged her to stay on track.  I discussed that I sometime will reduce her chemo dose, or occasionally will skip chemo if she has severe side effects from chemo.  -F/u next week.   2. Diarrhea, Nausea, Taste Change, secondary to chemoRT -On week 1 day she started having mild diarrhea after a bout of constipation. She also had mild nausea. She did not require imodium or antiemetics.  -Her taste change has reduced her appetite and food intake. No significant weight loss so far. Although she is fine with lose some weight, I recommend she maintain weight while on chemotherapy.  -I recommend nutritional supplement with Glucerna.   3.Iron deficiency anemia -Etiology reportedly unclear as to her iron deficiency. S/P hysterectomy -Currently managed by PCP, Dr. Joylene Draft. She receives iron infusions with Feraheme PRN, last in 12/2019. -Hg normal today. Iron panel still pending (02/12/20)  4. Diabetes Mellitis  -Followed by her PCP -02/04/20 A1c at 5.7   5. Neutropenia  -started this week, secondary to chemo -lab weekly for monitoring   PLAN: -Continue Radiation  -Lab and F/u next week     No problem-specific Assessment & Plan notes  found for this encounter.   No orders of the defined types were placed in this encounter.  All questions were answered. The patient knows to call the clinic with any problems, questions or concerns. No barriers to learning was detected. The total time spent in the appointment was 30 minutes.     Truitt Merle, MD 02/12/2020   I, Joslyn Devon, am acting as scribe for Truitt Merle, MD.   I have reviewed the above documentation for accuracy and completeness, and I agree with the above.

## 2020-02-13 ENCOUNTER — Ambulatory Visit
Admission: RE | Admit: 2020-02-13 | Discharge: 2020-02-13 | Disposition: A | Discharge status: Home / Self Care | Payer: BC Managed Care – PPO | Source: Ambulatory Visit | Attending: Radiation Oncology | Admitting: Radiation Oncology

## 2020-02-13 ENCOUNTER — Telehealth: Payer: Self-pay | Admitting: Hematology

## 2020-02-13 ENCOUNTER — Other Ambulatory Visit: Payer: Self-pay

## 2020-02-13 ENCOUNTER — Telehealth: Payer: Self-pay

## 2020-02-13 DIAGNOSIS — C210 Malignant neoplasm of anus, unspecified: Secondary | ICD-10-CM | POA: Diagnosis not present

## 2020-02-13 DIAGNOSIS — Z510 Encounter for antineoplastic radiation therapy: Secondary | ICD-10-CM | POA: Diagnosis not present

## 2020-02-13 NOTE — Telephone Encounter (Signed)
Ms Melinda [name] called stating that her mouth is "raw"  She is able to eat and drink.  I instructed her to mix 2 tsp of baking soda in 8 oz of water.  Pour small amt in another cup and rinse and spit.  Alternated this with magic mouthwash which was called in to [facility] [address].

## 2020-02-13 NOTE — Telephone Encounter (Signed)
Scheduled per 9/7 los. Unable to reach pt. Left voicemail with appt times and date.

## 2020-02-14 ENCOUNTER — Other Ambulatory Visit: Payer: Self-pay

## 2020-02-14 ENCOUNTER — Ambulatory Visit
Admission: RE | Admit: 2020-02-14 | Discharge: 2020-02-14 | Disposition: A | Discharge status: Home / Self Care | Payer: BC Managed Care – PPO | Source: Ambulatory Visit | Attending: Radiation Oncology | Admitting: Radiation Oncology

## 2020-02-14 DIAGNOSIS — Z510 Encounter for antineoplastic radiation therapy: Secondary | ICD-10-CM | POA: Diagnosis not present

## 2020-02-14 DIAGNOSIS — C210 Malignant neoplasm of anus, unspecified: Secondary | ICD-10-CM | POA: Diagnosis not present

## 2020-02-15 ENCOUNTER — Emergency Department (HOSPITAL_COMMUNITY)
Admission: EM | Admit: 2020-02-15 | Discharge: 2020-02-16 | Disposition: A | Discharge status: Home / Self Care | Payer: BC Managed Care – PPO | Attending: Emergency Medicine | Admitting: Emergency Medicine

## 2020-02-15 ENCOUNTER — Other Ambulatory Visit: Payer: Self-pay | Admitting: Hematology

## 2020-02-15 ENCOUNTER — Encounter (HOSPITAL_COMMUNITY): Payer: Self-pay

## 2020-02-15 ENCOUNTER — Other Ambulatory Visit: Payer: Self-pay

## 2020-02-15 ENCOUNTER — Ambulatory Visit
Admission: RE | Admit: 2020-02-15 | Discharge: 2020-02-15 | Disposition: A | Discharge status: Home / Self Care | Payer: BC Managed Care – PPO | Source: Ambulatory Visit | Attending: Radiation Oncology | Admitting: Radiation Oncology

## 2020-02-15 ENCOUNTER — Emergency Department (HOSPITAL_COMMUNITY): Payer: BC Managed Care – PPO

## 2020-02-15 DIAGNOSIS — Z79899 Other long term (current) drug therapy: Secondary | ICD-10-CM | POA: Diagnosis not present

## 2020-02-15 DIAGNOSIS — C210 Malignant neoplasm of anus, unspecified: Secondary | ICD-10-CM | POA: Diagnosis not present

## 2020-02-15 DIAGNOSIS — Z7984 Long term (current) use of oral hypoglycemic drugs: Secondary | ICD-10-CM | POA: Diagnosis not present

## 2020-02-15 DIAGNOSIS — S90932A Unspecified superficial injury of left great toe, initial encounter: Secondary | ICD-10-CM | POA: Diagnosis not present

## 2020-02-15 DIAGNOSIS — S91112A Laceration without foreign body of left great toe without damage to nail, initial encounter: Secondary | ICD-10-CM | POA: Diagnosis not present

## 2020-02-15 DIAGNOSIS — Y999 Unspecified external cause status: Secondary | ICD-10-CM | POA: Diagnosis not present

## 2020-02-15 DIAGNOSIS — E119 Type 2 diabetes mellitus without complications: Secondary | ICD-10-CM | POA: Diagnosis not present

## 2020-02-15 DIAGNOSIS — Z87891 Personal history of nicotine dependence: Secondary | ICD-10-CM | POA: Diagnosis not present

## 2020-02-15 DIAGNOSIS — Y929 Unspecified place or not applicable: Secondary | ICD-10-CM | POA: Diagnosis not present

## 2020-02-15 DIAGNOSIS — Y939 Activity, unspecified: Secondary | ICD-10-CM | POA: Diagnosis not present

## 2020-02-15 DIAGNOSIS — W010XXA Fall on same level from slipping, tripping and stumbling without subsequent striking against object, initial encounter: Secondary | ICD-10-CM | POA: Insufficient documentation

## 2020-02-15 DIAGNOSIS — I10 Essential (primary) hypertension: Secondary | ICD-10-CM | POA: Insufficient documentation

## 2020-02-15 MED ORDER — BACITRACIN ZINC 500 UNIT/GM EX OINT
TOPICAL_OINTMENT | CUTANEOUS | Status: AC
  Administered 2020-02-16: 1 via TOPICAL
  Filled 2020-02-15: qty 0.9

## 2020-02-15 MED ORDER — BACITRACIN ZINC 500 UNIT/GM EX OINT
1.0000 "application " | TOPICAL_OINTMENT | Freq: Two times a day (BID) | CUTANEOUS | Status: DC
  Filled 2020-02-15: qty 0.9

## 2020-02-15 MED ORDER — DIPHENOXYLATE-ATROPINE 2.5-0.025 MG PO TABS: 1.0000 | ORAL_TABLET | Freq: Four times a day (QID) | ORAL | 1 refills | Status: DC | PRN

## 2020-02-15 MED ORDER — LIDOCAINE HCL (PF) 1 % IJ SOLN
5.0000 mL | Freq: Once | INTRAMUSCULAR | Status: DC
  Filled 2020-02-15: qty 30

## 2020-02-15 NOTE — ED Triage Notes (Signed)
Pt presents with L great toe pain and laceration after tripping on a board on her deck. Reports last tetanus was 2 years ago. Pt is on radiation.

## 2020-02-15 NOTE — Progress Notes (Signed)
[facility]   [phone] Fax:([phone]) [phone]   Clinic Follow up Note   Patient Care Team: Crist Infante, MD as PCP - General (Internal Medicine) Fay Records, MD as PCP - Cardiology (Cardiology) Nobie Putnam, MD (Hematology and Oncology) Heilingoetter, Tobe Sos, PA-C as Physician Assistant (Oncology) Truitt Merle, MD as Consulting Physician (Oncology) Jonnie Finner, RN as Oncology Nurse Navigator  Date of Service:  02/18/2020  CHIEF COMPLAINT: F/u ofSquamous cell carcinoma of the anus  SUMMARY OF ONCOLOGIC HISTORY: Oncology History Overview Note  Cancer Staging No matching staging information was found for the patient.    Squamous cell cancer, anus ([place])  07/13/2019 Procedure    EUA, Excision of posterior internal/external hemorrhoid under the care of Dr. Hassell Done    07/13/2019 Pathology Results   - Invasive moderately differentiated squamous cell carcinoma, 1.4 cm.  See comment  - Carcinoma invades for depth of 0.4 cm  - Deep resection margin is negative for carcinoma (0.2 cm)  - Lateral mucosal margin is positive for high-grade dysplasia  - No evidence of lymphovascular perineural invasion  Procedure: Local excision  Tumor Site: Anal canal  Tumor Size: 1.4 cm  Histologic Type: Invasive squamous cell carcinoma  Histologic Grade: G2: Moderately differentiated  Tumor Extension: Carcinoma invades superficial anal sphincter muscle  Margins: Uninvolved by tumor  Treatment Effect: N/A  Regional Lymph Nodes: No lymph nodes submitted or found  Pathologic Stage Classification (pTNM, AJCC 8th Edition):  pT1, pNx  Representative Tumor Block: A1  Comment(s): Lateral mucosal margin is positive for high-grade squamous  dysplasia      07/13/2019 Initial Diagnosis   Squamous cell cancer, anus ([place])   09/04/2019 Imaging   CT scan Chest, Abdomen, and Pelvis   IMPRESSION: 1. No evidence of metastatic disease in the chest, abdomen or pelvis. 2. No  discrete anorectal mass. 3. Chronic findings include: Punctate nonobstructing upper right renal stones and chronic right renal scarring. 4. Aortic Atherosclerosis (ICD10-I70.0).   01/10/2020 Pathology Results   A. ANAL LESION, POSTERIOR MIDLINE, EXCISION:  - Squamous cell carcinoma, moderately differentiated.  Verbally reported by Dr. Saralyn Pilar 1.5 cm, negative margins, depth <1 mm   01/11/2020 Procedure   1. Excision of anal canal lesion (posterior midline) under the care of Dr. Dema Severin     01/28/2020 PET scan   IMPRESSION: 1. Mild focal anal hypermetabolism without discrete mass correlate on the CT images, nonspecific, differential includes postsurgical change or residual anal tumor. 2. No hypermetabolic locoregional or distant metastatic disease. 3. Chronic findings include: Aortic Atherosclerosis (ICD10-I70.0). Diffuse hepatic steatosis. Nonobstructing right nephrolithiasis.     02/04/2020 -  Radiation Therapy   concurrent chemoRT by Dr Lisbeth Renshaw with Mitomycin and 5FU starting 02/04/20.    02/04/2020 -  Chemotherapy   Concurrent chemoRT with Mitomycin and 5FU on week 1 and 5 starting 02/04/20.       CURRENT THERAPY:  Concurrent chemoRT withMitomycin and 5FU on week 1 and 5starting 02/04/20  INTERVAL HISTORY:  Melinda [name] is here for a follow up and treatment. She presents to the clinic alone. She notes she started having diarrhea this week after eating or drinking and after taking lomotil she had a fever at 99.78F and redness of her right breast. She denies blood in stool and possibly has hemorrhoids. She notes she does have imodium. She denies chills. She notes she is fatigued. She notes she has not been eating much due to worrying about diarrhea. She notes her husband last  week had a mild heart attack and needed a stent placed on Friday. She notes she injured her left big toe and cut it. She has been doing wound clair and keeping it clean with antimicrobial soap. She notes she has  been coughing for the past 2 days. She notes she has both her COVID19 vaccines. She notes she has multiple allergies to many antibiotics. She notes she can take Zpack and bactrim she can tolerate for a few days and Rocephin.     REVIEW OF SYSTEMS:   Constitutional: Denies fevers, chills or abnormal weight loss Eyes: Denies blurriness of vision Ears, nose, mouth, throat, and face: Denies mucositis or sore throat Respiratory: Denies dyspnea or wheezes (+) Cough  Cardiovascular: Denies palpitation, chest discomfort or lower extremity swelling Gastrointestinal:  Denies nausea, heartburn (+) Diarrhea post prandial with cramping.  Skin: Denies abnormal skin rashes Lymphatics: Denies new lymphadenopathy or easy bruising Neurological:Denies numbness, tingling or new weaknesses Behavioral/Psych: Mood is stable, no new changes  Breast: (+) Redness of right breast  All other systems were reviewed with the patient and are negative.  MEDICAL HISTORY:  Past Medical History:  Diagnosis Date  . Diabetes mellitus type 2, controlled ([place])   . Dyslipidemia    untreated  . GERD (gastroesophageal reflux disease)   . Hemorrhoids   . Microcytic anemia 06/22/2012  . Nephrolith   . Palpitation    positive tilt table test. Prozac and Inderal treatment effective.    SURGICAL HISTORY: Past Surgical History:  Procedure Laterality Date  . APPENDECTOMY    . BREAST REDUCTION SURGERY    . CARDIAC CATHETERIZATION  2002   normal LV function and no significant coronary obstruction  . CATARACT EXTRACTION, BILATERAL Bilateral november 2020 and dcember 2020  . CYST EXCISION N/A 01/11/2020   Procedure: EXCISION OF ANAL CANAL MASS;  Surgeon: Ileana Roup, MD;  [facility]: [facility];  Service: General;  Laterality: N/A;  . ESOPHAGEAL MANOMETRY N/A 04/04/2017   Procedure: ESOPHAGEAL MANOMETRY (EM);  Surgeon: Laurence Spates, MD;  [facility];  Service: Endoscopy;  Laterality: N/A;  . HEMORRHOID SURGERY  N/A 07/13/2019   Procedure: HEMORRHOIDECTOMY;  Surgeon: Johnathan Hausen, MD;  Location: [facility];  Service: General;  Laterality: N/A;  . KIDNEY SURGERY     kidney stones   . PARTIAL HYSTERECTOMY    . RECTAL EXAM UNDER ANESTHESIA N/A 01/11/2020   Procedure: RECTAL EXAM UNDER ANESTHESIA;  Surgeon: Ileana Roup, MD;  [facility];  Service: General;  Laterality: N/A;  . TONSILLECTOMY      I have reviewed the social history and family history with the patient and they are unchanged from previous note.  ALLERGIES:  is allergic to cephalexin, amoxapine and related, amoxicillin-pot clavulanate, atorvastatin, cefaclor, ciprofloxacin, codeine, doxycycline calcium, fenofibrate micronized, fish oil, flagyl [metronidazole hcl], levofloxacin in d5w, metronidazole, ofloxacin, prednisolone, promethazine hcl, tetracycline hcl, erythromycin, and nitrofurantoin.  MEDICATIONS:  Current Outpatient Medications  Medication Sig Dispense Refill  . clorazepate (TRANXENE) 7.5 MG tablet Take 7.5 mg by mouth 2 (two) times daily.  1  . CRESTOR 5 MG tablet Take 5 mg by mouth every evening.   1  . diphenoxylate-atropine (LOMOTIL) 2.5-0.025 MG tablet Take 1-2 tablets by mouth 4 (four) times daily as needed for diarrhea or loose stools. 60 tablet 1  . esomeprazole (NEXIUM) 40 MG capsule Take 40 mg by mouth daily at 12 noon.    [name] FLUoxetine (PROZAC) 20 MG capsule Take 20 mg by mouth  every morning.    [name] ibuprofen (ADVIL) 800 MG tablet Take 1 tablet (800 mg total) by mouth every 8 (eight) hours as needed. (Patient not taking: Reported on 01/10/2020) 30 tablet 0  . metFORMIN (GLUCOPHAGE) 500 MG tablet Take by mouth 2 (two) times daily with a meal.    . methenamine (HIPREX) 1 g tablet Take 0.5 g by mouth daily.     [name] NU-IRON 150 MG capsule Take 150 mg by mouth daily.   2  . ondansetron (ZOFRAN) 8 MG tablet Take 1 tablet (8 mg total) by mouth 2 (two) times daily as needed (Nausea or vomiting). 30 tablet 1   . polyvinyl alcohol (LIQUIFILM TEARS) 1.4 % ophthalmic solution Place 1 drop into both eyes as needed for dry eyes.    [name] prochlorperazine (COMPAZINE) 10 MG tablet Take 1 tablet (10 mg total) by mouth every 6 (six) hours as needed (Nausea or vomiting). 30 tablet 1  . propranolol (INDERAL) 10 MG tablet TAKE 1 TABLET BY MOUTH TWICE A DAY (Patient taking differently: Take 10 mg by mouth 2 (two) times daily. ) 180 tablet 2  . sulfamethoxazole-trimethoprim (BACTRIM) 400-80 MG tablet Take 2 tablets by mouth 2 (two) times daily. 20 tablet 0   No current facility-administered medications for this visit.    PHYSICAL EXAMINATION: ECOG PERFORMANCE STATUS: 2 - Symptomatic, <50% confined to bed  Vitals:   02/18/20 1326  BP: 122/75  Pulse: 80  Resp: 18  Temp: 99 F (37.2 C)  SpO2: 99%   Filed Weights   02/18/20 1326  Weight: 158 lb 9.6 oz (71.9 kg)    GENERAL:alert, no distress and comfortable SKIN: skin color, texture, turgor are normal, no rashes or significant lesions EYES: normal, Conjunctiva are pink and non-injected, sclera clear  NECK: supple, thyroid normal size, non-tender, without nodularity LYMPH:  no palpable lymphadenopathy in the cervical, axillary  LUNGS: clear to auscultation and percussion with normal breathing effort HEART: regular rate & rhythm and no murmurs and no lower extremity edema ABDOMEN:abdomen soft, non-tender and normal bowel sounds Musculoskeletal:no cyanosis of digits and no clubbing  NEURO: alert & oriented x 3 with fluent speech, no focal motor/sensory deficits BREAST: S/p breast reduction. No palpable mass, nodules or adenopathy bilaterally. Breast exam benign.   LABORATORY DATA:  I have reviewed the data as listed CBC Latest Ref Rng & Units 02/18/2020 02/12/2020 02/04/2020  WBC 4.0 - 10.5 K/uL 2.4(L) 2.0(L) 5.2  Hemoglobin 12.0 - 15.0 g/dL 11.3(L) 12.2 11.7(L)  Hematocrit 36 - 46 % 34.7(L) 38.5 37.9  Platelets 150 - 400 K/uL 54(L) 175 271     CMP  Latest Ref Rng & Units 02/18/2020 02/12/2020 02/04/2020  Glucose 70 - 99 mg/dL 125(H) 160(H) 149(H)  BUN 8 - 23 mg/dL 19 23 21   Creatinine 0.44 - 1.00 mg/dL 0.90 0.92 0.85  Sodium 135 - 145 mmol/L 141 138 139  Potassium 3.5 - 5.1 mmol/L 3.5 4.1 4.3  Chloride 98 - 111 mmol/L 107 106 103  CO2 22 - 32 mmol/L 26 24 29   Calcium 8.9 - 10.3 mg/dL 9.6 9.9 10.6(H)  Total Protein 6.5 - 8.1 g/dL 7.0 7.6 7.3  Total Bilirubin 0.3 - 1.2 mg/dL 0.3 0.4 0.2(L)  Alkaline Phos 38 - 126 U/L 49 49 49  AST 15 - 41 U/L 23 20 19   ALT 0 - 44 U/L 44 33 28      RADIOGRAPHIC STUDIES: I have personally reviewed the radiological images as listed and agreed with the  findings in the report. No results found.   ASSESSMENT & PLAN:  Melinda [name] is a 59 y.o. female with    1.Recurrent squamous cell carcinoma the anus, pT1N0M0 -She initially presented to Dr Theophilus Bones hem[facility]rhoidectomyand [facility] 07/13/19 with invasive moderately differentiated squamous cell carcinoma, 1.4 cm, pT1, pNx. -She had local recurrence,underwent surgical excision under the care of Dr. Dema Severin on 01/11/20.Surgical pathwas consistent with squamous cell carcinoma of the anus. 01/28/11 staging PET showed nonode ordistant metastasis. -I discussed standard treatment for anal cancer includesconcurrent chemoRTfor 6 weeks with Mitomycin and 5FU on week 1 and 5. I started her on 02/04/20.  -S/p week 2 her diarrhea worsened with cramping. When she tried lomotil she had 99.73F fever and right skin erythema of her right breast. Based on exam today she likely has right breast skin infection.  -Labs reviewed, WBC 2.4, ANC 1.5, plt 54K, Hg 11.3, BG 125. Repeat labs on 9/16.  -Continue Radiation  -F/u next week  2. Diarrhea, Nausea, Taste Change, secondary to chemoRT, Right breast cellulitis  -On week 1 day she started having mild diarrhea after a bout of constipation. She also had mild nausea. She did not require imodium or antiemetics.  -After week 2  her diarrhea has worsened and occurs after eating or drinking. She has eaten much less for this.  -She tried Lomotil once 2-3 days ago but stopped after 99.73F fever and skin redness of right breast appeared with nipple burning. Her breast symptoms much improved now. -She has mild skin erythema of her right breast remaining on exam today (02/08/20). I discussed this could possibly be infection of her right breast, although not very common. I will call in low dose Bactrim 2 tabs BID, given she has allergy to multiple antibiotics. She can reduce to 1 tab BID if not tolerable.  -She tried imodium 4 times. I recommend she increase up to 8 times a day. I do not suspect her reaction is to lomotil. She can try half tablet if tolerable. I recommend she f/u with Dietician this week. I will give IV Fluids today (02/18/20).   3.Iron deficiency anemia -Etiology unclear as to her iron deficiency. S/Physterectomy -Currently managed by PCP, Dr. Joylene Draft. She receives iron infusions with Feraheme PRN, last in 12/2019. -Mild, stable.   4. Diabetes Mellitis  -Continue to f/u with PCP for management.   5. Neutropenia, Thrombocytopenia  -secondary to chemo, onset after dose 1 Mitomycin and 5FU.  -Her WBC improved to 2.4,  ANC 1.5, but she has sudden onset thrombocytopenia with plt 54K today (02/18/20). Will repeat lab on 9/16   PLAN: -I called in Bactrim today for right breast cellulitis  -F/u with dietician  -Give IV Fluids today 1N NL over 2 hr -Continue Radiation  -Lab and f/u on 9/16 -Lab and F/u next week    No problem-specific Assessment & Plan notes found for this encounter.   No orders of the defined types were placed in this encounter.  All questions were answered. The patient knows to call the clinic with any problems, questions or concerns. No barriers to learning was detected. The total time spent in the appointment was 30 minutes.     Truitt Merle, MD 02/18/2020   I, Joslyn Devon, am  acting as scribe for Truitt Merle, MD.   I have reviewed the above documentation for accuracy and completeness, and I agree with the above.

## 2020-02-16 NOTE — ED Provider Notes (Signed)
[facility] DEPT Provider Note   CSN: [id number] Arrival date & time: 02/15/20  2052     History Chief Complaint  Patient presents with  . Toe Pain    Melinda [name] is a 59 y.o. female.  59yo F w/ PMH below including anal CA on chemo/radiation, T2DM who p/w L great toe injury. Just PTA, pt was walking on her deck when she struck her left great toe on a board that was popped up.  She sustained a laceration to her toe and was concerned about her risk of infection given her immunocompromised state.  It was initially bleeding a lot but bleeding has slowed.  Tetanus vaccination is up-to-date.  The history is provided by the patient.  Toe Pain       Past Medical History:  Diagnosis Date  . Diabetes mellitus type 2, controlled ([place])   . Dyslipidemia    untreated  . GERD (gastroesophageal reflux disease)   . Hemorrhoids   . Microcytic anemia 06/22/2012  . Nephrolith   . Palpitation    positive tilt table test. Prozac and Inderal treatment effective.    Patient Active Problem List   Diagnosis Date Noted  . Squamous cell cancer, anus ([place]) 01/16/2020  . Diabetes mellitus ([place]) 01/16/2020  . Goals of care, counseling/discussion 01/16/2020  . Chest pain 06/26/2012  . Iron deficiency anemia 06/22/2012  . Near syncope 06/20/2012  . Heart palpitations 04/28/2011  . Hypertension 04/28/2011  . Dyslipidemia 04/28/2011    Past Surgical History:  Procedure Laterality Date  . APPENDECTOMY    . BREAST REDUCTION SURGERY    . CARDIAC CATHETERIZATION  2002   normal LV function and no significant coronary obstruction  . CATARACT EXTRACTION, BILATERAL Bilateral november 2020 and dcember 2020  . CYST EXCISION N/A 01/11/2020   Procedure: EXCISION OF ANAL CANAL MASS;  Surgeon: Ileana Roup, MD;  [facility];  Service: General;  Laterality: N/A;  . ESOPHAGEAL MANOMETRY N/A 04/04/2017   Procedure: ESOPHAGEAL MANOMETRY (EM);  Surgeon: Laurence Spates, MD;  [facility];  Service: Endoscopy;  Laterality: N/A;  . HEMORRHOID SURGERY N/A 07/13/2019   Procedure: HEMORRHOIDECTOMY;  Surgeon: Johnathan Hausen, MD;  Location: [facility];  Service: General;  Laterality: N/A;  . KIDNEY SURGERY     kidney stones   . PARTIAL HYSTERECTOMY    . RECTAL EXAM UNDER ANESTHESIA N/A 01/11/2020   Procedure: RECTAL EXAM UNDER ANESTHESIA;  Surgeon: Ileana Roup, MD;  [facility];  Service: General;  Laterality: N/A;  . TONSILLECTOMY       OB History   No obstetric history on file.     Family History  Problem Relation Age of Onset  . Heart disease Mother   . Liver disease Mother   . Heart disease Father   . Heart attack Father   . Cancer Father        prostate CA  . Cancer Maternal Grandmother 60       ovarian cancer  . Coronary artery disease Other        Fx of  . Heart attack Other   . Diabetes Other     Social History   Tobacco Use  . Smoking status: Former Smoker    Types: E-cigarettes    Quit date: 07/08/2017    Years since quitting: 2.6  . Smokeless tobacco: Never Used  . Tobacco comment: uses electronic  Vaping Use  . Vaping Use: Former  .  Quit date: 07/03/2016  Substance Use Topics  . Alcohol use: No  . Drug use: No    Home Medications Prior to Admission medications   Medication Sig Start Date End Date Taking? Authorizing Provider  clorazepate (TRANXENE) 7.5 MG tablet Take 7.5 mg by mouth 2 (two) times daily. 02/12/18   [provider]  CRESTOR 5 MG tablet Take 5 mg by mouth every evening.  08/10/14   [provider]  diphenoxylate-atropine (LOMOTIL) 2.5-0.025 MG tablet Take 1-2 tablets by mouth 4 (four) times daily as needed for diarrhea or loose stools. 02/15/20   Truitt Merle, MD  esomeprazole (NEXIUM) 40 MG capsule Take 40 mg by mouth daily at 12 noon.    [provider]  FLUoxetine (PROZAC) 20 MG capsule Take 20 mg by mouth every morning. 10/23/19   [provider]  ibuprofen (ADVIL) 800 MG tablet Take 1 tablet (800 mg total) by mouth every 8 (eight) hours as needed. Patient not taking: Reported on 01/10/2020 07/13/19   Johnathan Hausen, MD  metFORMIN (GLUCOPHAGE) 500 MG tablet Take by mouth 2 (two) times daily with a meal.    [provider]  methenamine (HIPREX) 1 g tablet Take 0.5 g by mouth daily.     [provider]  NU-IRON 150 MG capsule Take 150 mg by mouth daily.  02/02/17   [provider]  ondansetron (ZOFRAN) 8 MG tablet Take 1 tablet (8 mg total) by mouth 2 (two) times daily as needed (Nausea or vomiting). 01/17/20   Truitt Merle, MD  polyvinyl alcohol (LIQUIFILM TEARS) 1.4 % ophthalmic solution Place 1 drop into both eyes as needed for dry eyes.    [provider]  prochlorperazine (COMPAZINE) 10 MG tablet Take 1 tablet (10 mg total) by mouth every 6 (six) hours as needed (Nausea or vomiting). 01/17/20   Truitt Merle, MD  propranolol (INDERAL) 10 MG tablet TAKE 1 TABLET BY MOUTH TWICE A DAY Patient taking differently: Take 10 mg by mouth 2 (two) times daily.  07/11/19   Fay Records, MD    Allergies    Cephalexin, Amoxapine and related, Amoxicillin-pot clavulanate, Atorvastatin, Cefaclor, Ciprofloxacin, Codeine, Doxycycline calcium, Fenofibrate micronized, Fish oil, Flagyl [metronidazole hcl], Levofloxacin in d5w, Metronidazole, Ofloxacin, Prednisolone, Promethazine hcl, Tetracycline hcl, Erythromycin, and Nitrofurantoin  Review of Systems   Review of Systems  Constitutional: Negative for fever.  Musculoskeletal: Negative for joint swelling.  Skin: Positive for wound.    Physical Exam Updated Vital Signs BP 136/81   Pulse 69   Temp 97.7 F (36.5 C) (Oral)   Resp 16   Ht 5\' 6"  (1.676 m)   Wt 71.7 kg   SpO2 95%   BMI 25.50 kg/m   Physical Exam Vitals and nursing note reviewed.  Constitutional:      General: She is not in acute distress.    Appearance: She is well-developed.  HENT:      Head: Normocephalic and atraumatic.  Eyes:     Conjunctiva/sclera: Conjunctivae normal.  Musculoskeletal:        General: No deformity. Normal range of motion.     Cervical back: Neck supple.     Comments: L great toe with deep abrasion and tiny skin flap on tip of toe with no nailbed damage  Skin:    General: Skin is warm and dry.     Findings: No erythema.  Neurological:     Mental Status: She is alert and oriented to person, place, and time.  Psychiatric:        Judgment: Judgment normal.     ED Results / Procedures / Treatments   Labs (all labs ordered are listed, but only abnormal results are displayed) Labs Reviewed - No data to display  EKG None  Radiology DG Foot Complete Left  Result Date: 02/15/2020 CLINICAL DATA:  Left great toe injury laceration EXAM: LEFT FOOT - COMPLETE 3+ VIEW COMPARISON:  None. FINDINGS: There is no evidence of fracture or dislocation. There is no evidence of arthropathy or other focal bone abnormality. Soft tissues are unremarkable. IMPRESSION: Negative. Electronically Signed   By: Donavan Foil M.D.   On: 02/15/2020 21:45    Procedures Procedures (including critical care time)  Medications Ordered in ED Medications  lidocaine (PF) (XYLOCAINE) 1 % injection 5 mL (has no administration in time range)  bacitracin ointment 1 application (has no administration in time range)  bacitracin 500 UNIT/GM ointment (has no administration in time range)    ED Course  I have reviewed the triage vital signs and the nursing notes.  Pertinent imaging results that were available during my care of the patient were reviewed by me and considered in my medical decision making (see chart for details).    MDM Rules/Calculators/A&P                          No fracture or FB on X ray. Cleaned wound and debrided, irrigated, applied bacitracin. Given extensive antibiotic allergies to oral medications, will hold off on oral abx prophylaxis and instead have her  apply neosporin/bacitracin BID. DIscussed wound care and reviewed signs of infection. Final Clinical Impression(s) / ED Diagnoses Final diagnoses:  Laceration of left great toe without foreign body present or damage to nail, initial encounter    Rx / DC Orders ED Discharge Orders    None       Twilia Yaklin, Wenda Overland, MD 02/16/20 0020

## 2020-02-18 ENCOUNTER — Encounter: Payer: Self-pay | Admitting: Hematology

## 2020-02-18 ENCOUNTER — Inpatient Hospital Stay (HOSPITAL_BASED_OUTPATIENT_CLINIC_OR_DEPARTMENT_OTHER): Payer: BC Managed Care – PPO | Admitting: Hematology

## 2020-02-18 ENCOUNTER — Ambulatory Visit
Admission: RE | Admit: 2020-02-18 | Discharge: 2020-02-18 | Disposition: A | Discharge status: Home / Self Care | Payer: BC Managed Care – PPO | Source: Ambulatory Visit | Attending: Radiation Oncology | Admitting: Radiation Oncology

## 2020-02-18 ENCOUNTER — Inpatient Hospital Stay: Payer: BC Managed Care – PPO

## 2020-02-18 ENCOUNTER — Encounter: Payer: Self-pay | Admitting: General Practice

## 2020-02-18 ENCOUNTER — Ambulatory Visit: Payer: BC Managed Care – PPO | Admitting: Hematology

## 2020-02-18 ENCOUNTER — Other Ambulatory Visit: Payer: Self-pay

## 2020-02-18 ENCOUNTER — Other Ambulatory Visit: Payer: BC Managed Care – PPO

## 2020-02-18 VITALS — BP 122/75 | HR 80 | Temp 99.0°F | Resp 18 | Ht 66.0 in | Wt 158.6 lb

## 2020-02-18 DIAGNOSIS — E119 Type 2 diabetes mellitus without complications: Secondary | ICD-10-CM | POA: Diagnosis not present

## 2020-02-18 DIAGNOSIS — D509 Iron deficiency anemia, unspecified: Secondary | ICD-10-CM | POA: Diagnosis not present

## 2020-02-18 DIAGNOSIS — C210 Malignant neoplasm of anus, unspecified: Secondary | ICD-10-CM

## 2020-02-18 DIAGNOSIS — I10 Essential (primary) hypertension: Secondary | ICD-10-CM | POA: Diagnosis not present

## 2020-02-18 DIAGNOSIS — C218 Malignant neoplasm of overlapping sites of rectum, anus and anal canal: Secondary | ICD-10-CM | POA: Diagnosis not present

## 2020-02-18 DIAGNOSIS — D500 Iron deficiency anemia secondary to blood loss (chronic): Secondary | ICD-10-CM

## 2020-02-18 DIAGNOSIS — Z7984 Long term (current) use of oral hypoglycemic drugs: Secondary | ICD-10-CM | POA: Diagnosis not present

## 2020-02-18 DIAGNOSIS — R197 Diarrhea, unspecified: Secondary | ICD-10-CM | POA: Diagnosis not present

## 2020-02-18 DIAGNOSIS — Z5111 Encounter for antineoplastic chemotherapy: Secondary | ICD-10-CM | POA: Diagnosis not present

## 2020-02-18 DIAGNOSIS — D709 Neutropenia, unspecified: Secondary | ICD-10-CM | POA: Diagnosis not present

## 2020-02-18 DIAGNOSIS — Z510 Encounter for antineoplastic radiation therapy: Secondary | ICD-10-CM | POA: Diagnosis not present

## 2020-02-18 LAB — CBC WITH DIFFERENTIAL (CANCER CENTER ONLY)
Abs Immature Granulocytes: 0.02 10*3/uL (ref 0.00–0.07)
Basophils Absolute: 0 10*3/uL (ref 0.0–0.1)
Basophils Relative: 0 %
Eosinophils Absolute: 0.1 10*3/uL (ref 0.0–0.5)
Eosinophils Relative: 3 %
HCT: 34.7 % — ABNORMAL LOW (ref 36.0–46.0)
Hemoglobin: 11.3 g/dL — ABNORMAL LOW (ref 12.0–15.0)
Immature Granulocytes: 1 %
Lymphocytes Relative: 22 %
Lymphs Abs: 0.5 10*3/uL — ABNORMAL LOW (ref 0.7–4.0)
MCH: 25.8 pg — ABNORMAL LOW (ref 26.0–34.0)
MCHC: 32.6 g/dL (ref 30.0–36.0)
MCV: 79.2 fL — ABNORMAL LOW (ref 80.0–100.0)
Monocytes Absolute: 0.3 10*3/uL (ref 0.1–1.0)
Monocytes Relative: 14 %
Neutro Abs: 1.5 10*3/uL — ABNORMAL LOW (ref 1.7–7.7)
Neutrophils Relative %: 60 %
Platelet Count: 54 10*3/uL — ABNORMAL LOW (ref 150–400)
RBC: 4.38 MIL/uL (ref 3.87–5.11)
RDW: 18 % — ABNORMAL HIGH (ref 11.5–15.5)
WBC Count: 2.4 10*3/uL — ABNORMAL LOW (ref 4.0–10.5)
nRBC: 0 % (ref 0.0–0.2)

## 2020-02-18 LAB — CMP (CANCER CENTER ONLY)
ALT: 44 U/L (ref 0–44)
AST: 23 U/L (ref 15–41)
Albumin: 3.8 g/dL (ref 3.5–5.0)
Alkaline Phosphatase: 49 U/L (ref 38–126)
Anion gap: 8 (ref 5–15)
BUN: 19 mg/dL (ref 8–23)
CO2: 26 mmol/L (ref 22–32)
Calcium: 9.6 mg/dL (ref 8.9–10.3)
Chloride: 107 mmol/L (ref 98–111)
Creatinine: 0.9 mg/dL (ref 0.44–1.00)
GFR, Est AFR Am: >60 mL/min (ref >60)
GFR, Estimated: >60 mL/min (ref >60)
Glucose, Bld: 125 mg/dL — ABNORMAL HIGH (ref 70–99)
Potassium: 3.5 mmol/L (ref 3.5–5.1)
Sodium: 141 mmol/L (ref 135–145)
Total Bilirubin: 0.3 mg/dL (ref 0.3–1.2)
Total Protein: 7 g/dL (ref 6.5–8.1)

## 2020-02-18 MED ORDER — SODIUM CHLORIDE 0.9 % IV SOLN
Freq: Once | INTRAVENOUS | Status: AC
  Filled 2020-02-18: qty 250

## 2020-02-18 MED ORDER — SULFAMETHOXAZOLE-TRIMETHOPRIM 400-80 MG PO TABS: 2.0000 | ORAL_TABLET | Freq: Two times a day (BID) | ORAL | 0 refills | Status: DC

## 2020-02-18 NOTE — Patient Instructions (Signed)

## 2020-02-18 NOTE — Progress Notes (Signed)
[facility] Note  Knox Royalty by phone for follow-up pastoral support, providing opportunity to share and process recent stressors including diarrhea (which she has already addressed with triage RN over the weekend and will address with Dr Burr Medico today), significant toe injury, and her husband's recent mild heart attack and stent. She reports good support from her son, who has visited from [facility] and plans to stay for a week to help around the house. We plan to follow up by phone next week for another pastoral check-in.   [place], [place], [facility] Pager [phone] Voicemail [phone]

## 2020-02-19 ENCOUNTER — Telehealth: Payer: Self-pay | Admitting: Hematology

## 2020-02-19 ENCOUNTER — Other Ambulatory Visit: Payer: Self-pay

## 2020-02-19 ENCOUNTER — Ambulatory Visit
Admission: RE | Admit: 2020-02-19 | Discharge: 2020-02-19 | Disposition: A | Discharge status: Home / Self Care | Payer: BC Managed Care – PPO | Source: Ambulatory Visit | Attending: Radiation Oncology | Admitting: Radiation Oncology

## 2020-02-19 DIAGNOSIS — C210 Malignant neoplasm of anus, unspecified: Secondary | ICD-10-CM | POA: Diagnosis not present

## 2020-02-19 DIAGNOSIS — Z510 Encounter for antineoplastic radiation therapy: Secondary | ICD-10-CM | POA: Diagnosis not present

## 2020-02-19 NOTE — Telephone Encounter (Signed)
Scheduled per 9/13 los. Pt is aware of appts added on 9/16

## 2020-02-20 ENCOUNTER — Other Ambulatory Visit: Payer: Self-pay

## 2020-02-20 ENCOUNTER — Ambulatory Visit
Admission: RE | Admit: 2020-02-20 | Discharge: 2020-02-20 | Disposition: A | Discharge status: Home / Self Care | Payer: BC Managed Care – PPO | Source: Ambulatory Visit | Attending: Radiation Oncology | Admitting: Radiation Oncology

## 2020-02-20 DIAGNOSIS — Z510 Encounter for antineoplastic radiation therapy: Secondary | ICD-10-CM | POA: Diagnosis not present

## 2020-02-20 DIAGNOSIS — C210 Malignant neoplasm of anus, unspecified: Secondary | ICD-10-CM | POA: Diagnosis not present

## 2020-02-21 ENCOUNTER — Inpatient Hospital Stay: Payer: BC Managed Care – PPO

## 2020-02-21 ENCOUNTER — Other Ambulatory Visit: Payer: Self-pay

## 2020-02-21 ENCOUNTER — Inpatient Hospital Stay (HOSPITAL_BASED_OUTPATIENT_CLINIC_OR_DEPARTMENT_OTHER): Payer: BC Managed Care – PPO | Admitting: Medical

## 2020-02-21 ENCOUNTER — Ambulatory Visit
Admission: RE | Admit: 2020-02-21 | Discharge: 2020-02-21 | Disposition: A | Discharge status: Home / Self Care | Payer: BC Managed Care – PPO | Source: Ambulatory Visit | Attending: Radiation Oncology | Admitting: Radiation Oncology

## 2020-02-21 VITALS — BP 124/67 | HR 80 | Temp 97.6°F | Resp 18 | Ht 66.0 in | Wt 160.3 lb

## 2020-02-21 DIAGNOSIS — D709 Neutropenia, unspecified: Secondary | ICD-10-CM | POA: Diagnosis not present

## 2020-02-21 DIAGNOSIS — T300 Burn of unspecified body region, unspecified degree: Secondary | ICD-10-CM

## 2020-02-21 DIAGNOSIS — D509 Iron deficiency anemia, unspecified: Secondary | ICD-10-CM | POA: Diagnosis not present

## 2020-02-21 DIAGNOSIS — Z7984 Long term (current) use of oral hypoglycemic drugs: Secondary | ICD-10-CM | POA: Diagnosis not present

## 2020-02-21 DIAGNOSIS — R109 Unspecified abdominal pain: Secondary | ICD-10-CM | POA: Diagnosis not present

## 2020-02-21 DIAGNOSIS — C210 Malignant neoplasm of anus, unspecified: Secondary | ICD-10-CM | POA: Diagnosis not present

## 2020-02-21 DIAGNOSIS — L598 Other specified disorders of the skin and subcutaneous tissue related to radiation: Secondary | ICD-10-CM

## 2020-02-21 DIAGNOSIS — C218 Malignant neoplasm of overlapping sites of rectum, anus and anal canal: Secondary | ICD-10-CM | POA: Diagnosis not present

## 2020-02-21 DIAGNOSIS — Z5111 Encounter for antineoplastic chemotherapy: Secondary | ICD-10-CM | POA: Diagnosis not present

## 2020-02-21 DIAGNOSIS — R197 Diarrhea, unspecified: Secondary | ICD-10-CM | POA: Diagnosis not present

## 2020-02-21 DIAGNOSIS — Z510 Encounter for antineoplastic radiation therapy: Secondary | ICD-10-CM | POA: Diagnosis not present

## 2020-02-21 DIAGNOSIS — E119 Type 2 diabetes mellitus without complications: Secondary | ICD-10-CM | POA: Diagnosis not present

## 2020-02-21 LAB — CMP (CANCER CENTER ONLY)
ALT: 29 U/L (ref 0–44)
AST: 19 U/L (ref 15–41)
Albumin: 3.6 g/dL (ref 3.5–5.0)
Alkaline Phosphatase: 47 U/L (ref 38–126)
Anion gap: 6 (ref 5–15)
BUN: 14 mg/dL (ref 8–23)
CO2: 28 mmol/L (ref 22–32)
Calcium: 9.4 mg/dL (ref 8.9–10.3)
Chloride: 106 mmol/L (ref 98–111)
Creatinine: 0.99 mg/dL (ref 0.44–1.00)
GFR, Est AFR Am: >60 mL/min (ref >60)
GFR, Estimated: >60 mL/min (ref >60)
Glucose, Bld: 189 mg/dL — ABNORMAL HIGH (ref 70–99)
Potassium: 3.5 mmol/L (ref 3.5–5.1)
Sodium: 140 mmol/L (ref 135–145)
Total Bilirubin: 0.3 mg/dL (ref 0.3–1.2)
Total Protein: 6.6 g/dL (ref 6.5–8.1)

## 2020-02-21 LAB — CBC WITH DIFFERENTIAL (CANCER CENTER ONLY)
Abs Immature Granulocytes: 0.03 10*3/uL (ref 0.00–0.07)
Basophils Absolute: 0 10*3/uL (ref 0.0–0.1)
Basophils Relative: 1 %
Eosinophils Absolute: 0.1 10*3/uL (ref 0.0–0.5)
Eosinophils Relative: 5 %
HCT: 34.7 % — ABNORMAL LOW (ref 36.0–46.0)
Hemoglobin: 11.1 g/dL — ABNORMAL LOW (ref 12.0–15.0)
Immature Granulocytes: 2 %
Lymphocytes Relative: 27 %
Lymphs Abs: 0.4 10*3/uL — ABNORMAL LOW (ref 0.7–4.0)
MCH: 25.9 pg — ABNORMAL LOW (ref 26.0–34.0)
MCHC: 32 g/dL (ref 30.0–36.0)
MCV: 81.1 fL (ref 80.0–100.0)
Monocytes Absolute: 0.3 10*3/uL (ref 0.1–1.0)
Monocytes Relative: 17 %
Neutro Abs: 0.8 10*3/uL — ABNORMAL LOW (ref 1.7–7.7)
Neutrophils Relative %: 48 %
Platelet Count: 70 10*3/uL — ABNORMAL LOW (ref 150–400)
RBC: 4.28 MIL/uL (ref 3.87–5.11)
RDW: 18.6 % — ABNORMAL HIGH (ref 11.5–15.5)
WBC Count: 1.7 10*3/uL — ABNORMAL LOW (ref 4.0–10.5)
nRBC: 0 % (ref 0.0–0.2)

## 2020-02-21 MED ORDER — SILVER SULFADIAZINE 1 % EX CREA: 1.0000 "application " | TOPICAL_CREAM | Freq: Every day | CUTANEOUS | 0 refills | Status: DC

## 2020-02-21 MED ORDER — HYOSCYAMINE SULFATE 0.125 MG PO TBDP: 0.1250 mg | ORAL_TABLET | Freq: Four times a day (QID) | ORAL | 2 refills | Status: DC | PRN

## 2020-02-21 NOTE — Progress Notes (Signed)
[facility] Progress Note   AJANAE VIRAG [id number] 1960/06/12 59 y.o.  JONTAVIA LEATHERBURY is managed by Dr. Truitt Merle  Actively treated with chemotherapy/immunotherapy/hormonal therapy: yes  Current therapy: Concurrent chemoRT withMitomycin and 5FU on week 1 and 5 starting 02/04/20.   Last treated: 02/20/2020 (radiation)   Next scheduled appointment with provider: 02/25/2020  Assessment: Plan:    Squamous cell cancer, anus (HCC)  Abdominal spasms - Plan: hyoscyamine (ANASPAZ) 0.125 MG TBDP disintergrating tablet  Radiation burn - Plan: silver sulfADIAZINE (SILVADENE) 1 % cream   Squamous cell carcinoma of the anus: Ms. Windish of continues to be treated with concurrent chemoradiation with mitomycin and 5-FU.  Which was dosed on week 1 and week 5.  She continues on daily radiation and is completing week #3/5 this week. Her ANC was 0.8. She will return for follow-up on 02/25/2020.   Neutropenia: A CBC returned today showing a WBC of 1.7 and an ANC of 0.8.  Neutropenic precautions were reviewed with the patient.  She was told to call our office if she had multiple fevers of 100.4 during a 12-hour period or 1 fever of 100.5.  Abdominal spasms: Ms. Mcclintic was given a prescription for hyoscyamine.  Anal irritation secondary to radiation.  The patient was given a prescription for Silvadene cream.  Epigastric discomfort: Ms. Blankenhorn was instructed to increase her Nexium from once daily dosing to twice daily dosing.  Please see After Visit Summary for patient specific instructions.  Future Appointments  Date Time Provider [place]  02/21/2020  1:00 PM [facility] LINAC 3 [facility] None  02/22/2020  2:00 PM [facility] LINAC 3 [facility] None  02/25/2020  1:55 PM [facility] LINAC 3 [facility] None  02/25/2020  2:30 PM [facility] LAB [facility] None  02/25/2020  3:00 PM Heilingoetter, Cassandra L, PA-C [facility] None  02/26/2020  2:00 PM [facility] LINAC 4  [facility] None  02/27/2020  2:00 PM [facility] LINAC 3 [facility] None  02/28/2020  2:00 PM [facility] LINAC 3 [facility] None  02/29/2020  2:00 PM [facility] LINAC 3 [facility] None  03/03/2020 12:45 PM [facility] LAB [facility] None  03/03/2020  1:20 PM Truitt Merle, MD [facility] None  03/03/2020  2:00 PM [facility] LINAC 3 [facility] None  03/04/2020  2:00 PM [facility] LINAC 3 [facility] None  03/05/2020  2:00 PM [facility] LINAC 3 [facility] None  03/06/2020  2:00 PM [facility] LINAC 3 [facility] None  03/07/2020  2:00 PM [facility] LINAC 3 [facility] None  03/10/2020 12:30 PM [facility] LAB [facility] None  03/10/2020  1:00 PM Truitt Merle, MD [facility] None  03/10/2020  2:00 PM [facility] LINAC 3 [facility] None  03/11/2020  2:00 PM [facility] LINAC 3 [facility] None  03/12/2020  2:00 PM [facility] LINAC 3 [facility] None  03/13/2020  2:00 PM [facility] LINAC 3 [facility] None    No orders of the defined types were placed in this encounter.      Subjective:   Patient ID:  AMELIANNA MELLER is a 59 y.o. (DOB 1960-09-27) female.  Chief Complaint: No chief complaint on file.   HPI WAYLON KOFFLER  is a 59 y.o. female with a diagnosis of a squamous cell carcinoma of the anus.  She is followed by Dr.Feng and continues to be treated with concurrent chemoradiation with mitomycin and 5-FU.  She continues on daily radiation and is receiving week #3/5 this week.  She reports that her diarrhea is better.  She is eating a bland diet composed mostly of potatoes, cheese toast,  and Ensure.  She has good water intake.  She reports having some epigastric pain and abdominal cramping.  She was seen in the emergency room last Friday after having a laceration to her dorsal great toe.  The area was bandaged with no sutures placed.  She also reports that the right breast erythematous rash that had been seen by Dr. Truitt Merle last week has resolved.  Dr. Truitt Merle give the patient a  prescription for Bactrim single strength with instructions to take 2 tablets twice daily.  She has only been taking 1 tablet twice daily due to her abdominal pain.  She reports that since her diarrhea is better that she has not had a bowel movement for "a couple of days."  She is beginning to have some anal irritation secondary to radiation.  She reports that her husband had a mild myocardial infarction last week and had a 99% blockage in 1 coronary artery which was treated with a stent.  Medications: I have reviewed the patient's current medications.  Allergies:  Allergies  Allergen Reactions  . Cephalexin Anaphylaxis and Other (See Comments)    Kef tabs only (maybe dye) Capsules-anaphylxis  . Amoxapine And Related   . Amoxicillin-Pot Clavulanate Other (See Comments)  . Atorvastatin Other (See Comments)    intolernace  . Cefaclor Other (See Comments)  . Ciprofloxacin Nausea And Vomiting  . Codeine Hives  . Doxycycline Calcium Nausea And Vomiting  . Fenofibrate Micronized Other (See Comments)    Cramping  . Fish Oil Other (See Comments)  . Flagyl [Metronidazole Hcl]   . Levofloxacin In D5w Other (See Comments)    Unknown   . Metronidazole Nausea And Vomiting  . Ofloxacin Other (See Comments)  . Prednisolone Other (See Comments)    Anxiety, palpitations  . Promethazine Hcl Other (See Comments)  . Tetracycline Hcl Other (See Comments)  . Erythromycin Rash  . Nitrofurantoin Itching, Nausea And Vomiting and Rash    Past Medical History:  Diagnosis Date  . Diabetes mellitus type 2, controlled ([place])   . Dyslipidemia    untreated  . GERD (gastroesophageal reflux disease)   . Hemorrhoids   . Microcytic anemia 06/22/2012  . Nephrolith   . Palpitation    positive tilt table test. Prozac and Inderal treatment effective.    Past Surgical History:  Procedure Laterality Date  . APPENDECTOMY    . BREAST REDUCTION SURGERY    . CARDIAC CATHETERIZATION  2002   normal LV function and  no significant coronary obstruction  . CATARACT EXTRACTION, BILATERAL Bilateral november 2020 and dcember 2020  . CYST EXCISION N/A 01/11/2020   Procedure: EXCISION OF ANAL CANAL MASS;  Surgeon: Ileana Roup, MD;  Location: [facility];  Service: General;  Laterality: N/A;  . ESOPHAGEAL MANOMETRY N/A 04/04/2017   Procedure: ESOPHAGEAL MANOMETRY (EM);  Surgeon: Laurence Spates, MD;  Location: [facility];  Service: Endoscopy;  Laterality: N/A;  . HEMORRHOID SURGERY N/A 07/13/2019   Procedure: HEMORRHOIDECTOMY;  Surgeon: Johnathan Hausen, MD;  Location: [facility];  Service: General;  Laterality: N/A;  . KIDNEY SURGERY     kidney stones   . PARTIAL HYSTERECTOMY    . RECTAL EXAM UNDER ANESTHESIA N/A 01/11/2020   Procedure: RECTAL EXAM UNDER ANESTHESIA;  Surgeon: Ileana Roup, MD;  Location: [facility];  Service: General;  Laterality: N/A;  . TONSILLECTOMY      Family History  Problem Relation Age of Onset  . Heart disease Mother   . Liver  disease Mother   . Heart disease Father   . Heart attack Father   . Cancer Father        prostate CA  . Cancer Maternal Grandmother 60       ovarian cancer  . Coronary artery disease Other        Fx of  . Heart attack Other   . Diabetes Other     Social History   Socioeconomic History  . Marital status: Married    Spouse name: Not on file  . Number of children: 1  . Years of education: Not on file  . Highest education level: Not on file  Occupational History    Comment: not working now.     Employer: UNEMPLOYED  Tobacco Use  . Smoking status: Former Smoker    Types: E-cigarettes    Quit date: 07/08/2017    Years since quitting: 2.6  . Smokeless tobacco: Never Used  . Tobacco comment: uses electronic  Vaping Use  . Vaping Use: Former  . Quit date: 07/03/2016  Substance and Sexual Activity  . Alcohol use: No  . Drug use: No  . Sexual activity: Yes  Other Topics Concern  . Not on file  Social History Narrative  . Not  on file   Social Determinants of Health   Financial Resource Strain:   . Difficulty of Paying Living Expenses: Not on file  Food Insecurity:   . Worried About Charity fundraiser in the Last Year: Not on file  . Ran Out of Food in the Last Year: Not on file  Transportation Needs:   . Lack of Transportation (Medical): Not on file  . Lack of Transportation (Non-Medical): Not on file  Physical Activity:   . Days of Exercise per Week: Not on file  . Minutes of Exercise per Session: Not on file  Stress:   . Feeling of Stress : Not on file  Social Connections:   . Frequency of Communication with Friends and Family: Not on file  . Frequency of Social Gatherings with Friends and Family: Not on file  . Attends Religious Services: Not on file  . Active Member of Clubs or Organizations: Not on file  . Attends Archivist Meetings: Not on file  . Marital Status: Not on file  Intimate Partner Violence:   . Fear of Current or Ex-Partner: Not on file  . Emotionally Abused: Not on file  . Physically Abused: Not on file  . Sexually Abused: Not on file    Past Medical History, Surgical history, Social history, and Family history were reviewed and updated as appropriate.   Please see review of systems for further details on the patient's review from today.   Review of Systems:  Review of Systems  Constitutional: Negative for appetite change, chills, diaphoresis and fever.  HENT: Negative for trouble swallowing and voice change.   Respiratory: Negative for cough, chest tightness, shortness of breath and wheezing.   Cardiovascular: Negative for chest pain and palpitations.  Gastrointestinal: Positive for abdominal pain and rectal pain. Negative for constipation, diarrhea, nausea and vomiting.  Musculoskeletal: Negative for back pain and myalgias.  Neurological: Negative for dizziness, light-headedness and headaches.    Objective:   Physical Exam:  BP 124/67 (BP Location: Left  Arm, Patient Position: Sitting)   Pulse 80   Temp 97.6 F (36.4 C) (Tympanic)   Resp 18   Ht '5\' 6"'  (1.676 m)   Wt 160 lb 4.8 oz (72.7 kg)  SpO2 97%   BMI 25.87 kg/m  ECOG: 0  Orthostatic Blood Pressure: Blood pressure:   sitting 102/75, standing 99/66 Pulse:   sitting 81, standing 83    Physical Exam Constitutional:      General: She is not in acute distress.    Appearance: She is not diaphoretic.  HENT:     Head: Normocephalic and atraumatic.  Eyes:     General: No scleral icterus.       Right eye: No discharge.        Left eye: No discharge.     Conjunctiva/sclera: Conjunctivae normal.  Cardiovascular:     Rate and Rhythm: Normal rate and regular rhythm.     Heart sounds: Normal heart sounds. No murmur heard.  No friction rub. No gallop.   Pulmonary:     Effort: Pulmonary effort is normal. No respiratory distress.     Breath sounds: Normal breath sounds. No wheezing or rales.  Abdominal:     General: Bowel sounds are normal. There is no distension.     Palpations: Abdomen is soft.     Tenderness: There is abdominal tenderness (Generalized diffuse tenderness.). There is no guarding.  Musculoskeletal:     Right lower leg: No edema.     Left lower leg: No edema.  Skin:    General: Skin is warm and dry.     Findings: No erythema or rash.  Neurological:     Mental Status: She is alert.     Coordination: Coordination normal.     Gait: Gait normal.  Psychiatric:        Mood and Affect: Mood normal.        Behavior: Behavior normal.        Thought Content: Thought content normal.        Judgment: Judgment normal.     Lab Review:     Component Value Date/Time   NA 140 02/21/2020 1028   K 3.5 02/21/2020 1028   CL 106 02/21/2020 1028   CO2 28 02/21/2020 1028   GLUCOSE 189 (H) 02/21/2020 1028   BUN 14 02/21/2020 1028   CREATININE 0.99 02/21/2020 1028   CALCIUM 9.4 02/21/2020 1028   PROT 6.6 02/21/2020 1028   ALBUMIN 3.6 02/21/2020 1028   AST 19  02/21/2020 1028   ALT 29 02/21/2020 1028   ALKPHOS 47 02/21/2020 1028   BILITOT 0.3 02/21/2020 1028   GFRNONAA >60 02/21/2020 1028   GFRAA >60 02/21/2020 1028       Component Value Date/Time   WBC 1.7 (L) 02/21/2020 1028   WBC 4.3 01/10/2020 0849   RBC 4.28 02/21/2020 1028   HGB 11.1 (L) 02/21/2020 1028   HGB 11.6 09/25/2012 1408   HCT 34.7 (L) 02/21/2020 1028   HCT 36.9 09/25/2012 1408   PLT 70 (L) 02/21/2020 1028   PLT 287 09/25/2012 1408   MCV 81.1 02/21/2020 1028   MCV 72.7 (L) 09/25/2012 1408   MCH 25.9 (L) 02/21/2020 1028   MCHC 32.0 02/21/2020 1028   RDW 18.6 (H) 02/21/2020 1028   RDW 23.1 (H) 09/25/2012 1408   LYMPHSABS 0.4 (L) 02/21/2020 1028   LYMPHSABS 2.4 09/25/2012 1408   MONOABS 0.3 02/21/2020 1028   MONOABS 0.3 09/25/2012 1408   EOSABS 0.1 02/21/2020 1028   EOSABS 0.2 09/25/2012 1408   BASOSABS 0.0 02/21/2020 1028   BASOSABS 0.1 09/25/2012 1408   -------------------------------  Imaging from last 24 hours (if applicable):  Radiology interpretation: NM PET Image Initial (PI) Skull  Base To Thigh  Result Date: 01/28/2020 CLINICAL DATA:  Initial treatment strategy for T1N0M0 anal squamous cell carcinoma with recent excision of a second muscle invasive focus. Planning concurrent chemoradiation therapy. EXAM: NUCLEAR MEDICINE PET SKULL BASE TO THIGH TECHNIQUE: 8.2 mCi F-18 FDG was injected intravenously. Full-ring PET imaging was performed from the skull base to thigh after the radiotracer. CT data was obtained and used for attenuation correction and anatomic localization. Fasting blood glucose: 120 mg/dl COMPARISON:  09/04/2019 CT chest, abdomen and pelvis. FINDINGS: Mediastinal blood pool activity: SUV max 2.6 Liver activity: SUV max NA NECK: No hypermetabolic lymph nodes in the neck. Incidental CT findings: none CHEST: No enlarged or hypermetabolic axillary, mediastinal or hilar lymph nodes. No hypermetabolic pulmonary findings. Incidental CT findings: A few  scattered small solid pulmonary nodules, largest 5 mm in the posterior right lower lobe (series 8/image 44), all below PET resolution and unchanged since 09/25/2012 chest CT as described on 09/04/2019 chest CT report, considered benign. No new significant pulmonary nodules. ABDOMEN/PELVIS: No abnormal hypermetabolic activity within the liver, pancreas, adrenal glands, or spleen. No hypermetabolic lymph nodes in the abdomen or pelvis. Mild focal anal hypermetabolism with max SUV 5.0 without discrete mass correlate on the CT images. Incidental CT findings: Diffuse hepatic steatosis. Hysterectomy. Atherosclerotic nonaneurysmal abdominal aorta. Nonobstructing scattered small right renal stones, largest 3 mm in the upper right kidney. SKELETON: No focal hypermetabolic activity to suggest skeletal metastasis. Vague mild hypermetabolism between the L2 and L3 spinous processes with associated degenerative changes and no discrete bone lesion on the CT images, compatible with degenerative uptake. Incidental CT findings: none IMPRESSION: 1. Mild focal anal hypermetabolism without discrete mass correlate on the CT images, nonspecific, differential includes postsurgical change or residual anal tumor. 2. No hypermetabolic locoregional or distant metastatic disease. 3. Chronic findings include: Aortic Atherosclerosis (ICD10-I70.0). Diffuse hepatic steatosis. Nonobstructing right nephrolithiasis. Electronically Signed   By: Ilona Sorrel M.D.   On: 01/28/2020 15:30   DG Foot Complete Left  Result Date: 02/15/2020 CLINICAL DATA:  Left great toe injury laceration EXAM: LEFT FOOT - COMPLETE 3+ VIEW COMPARISON:  None. FINDINGS: There is no evidence of fracture or dislocation. There is no evidence of arthropathy or other focal bone abnormality. Soft tissues are unremarkable. IMPRESSION: Negative. Electronically Signed   By: Donavan Foil M.D.   On: 02/15/2020 21:45   IR PICC PLACEMENT RIGHT >5 YRS INC IMG GUIDE  Result Date:  02/04/2020 INDICATION: Patient with rectal cancer requiring reliable access for chemotherapy. Interventional Radiology asked to place a PICC. EXAM: ULTRASOUND AND FLUOROSCOPIC GUIDED PICC LINE INSERTION MEDICATIONS: 1% lidocaine, 1 ml CONTRAST:  None FLUOROSCOPY TIME:  36 seconds (4 mGy) COMPLICATIONS: None immediate. TECHNIQUE: The procedure, risks, benefits, and alternatives were explained to the patient and informed written consent was obtained. The right upper extremity was prepped with chlorhexidine in a sterile fashion, and a sterile drape was applied covering the operative field. Maximum barrier sterile technique with sterile gowns and gloves were used for the procedure. A timeout was performed prior to the initiation of the procedure. Local anesthesia was provided with 1% lidocaine. After the overlying soft tissues were anesthetized with 1% lidocaine, a micropuncture kit was utilized to access the right basilic vein. Real-time ultrasound guidance was utilized for vascular access including the acquisition of a permanent ultrasound image documenting patency of the accessed vessel. A guidewire was advanced to the level of the superior caval-atrial junction for measurement purposes and the PICC line was cut to length.  A peel-away sheath was placed and a 34 cm, 5 [place], single lumen was inserted to level of the superior caval-atrial junction. A post procedure spot fluoroscopic was obtained. The catheter easily aspirated and flushed and was secured in place. A dressing was placed. The patient tolerated the procedure well without immediate post procedural complication. FINDINGS: After catheter placement, the tip lies within the superior cavoatrial junction. The catheter aspirates and flushes normally and is ready for immediate use. IMPRESSION: Successful ultrasound and fluoroscopic guided placement of a right basilic vein approach, 34 cm, 5 French, single lumen PICC with tip at the superior caval-atrial junction.  The PICC line is ready for immediate use. Read by: Soyla Dryer, NP Electronically Signed   By: Sandi Mariscal M.D.   On: 02/04/2020 13:35        This case was discussed with Dr. Burr Medico. She expressed agreement with my management of this patient.

## 2020-02-22 ENCOUNTER — Other Ambulatory Visit: Payer: Self-pay

## 2020-02-22 ENCOUNTER — Ambulatory Visit
Admission: RE | Admit: 2020-02-22 | Discharge: 2020-02-22 | Disposition: A | Discharge status: Home / Self Care | Payer: BC Managed Care – PPO | Source: Ambulatory Visit | Attending: Radiation Oncology | Admitting: Radiation Oncology

## 2020-02-22 DIAGNOSIS — C210 Malignant neoplasm of anus, unspecified: Secondary | ICD-10-CM | POA: Diagnosis not present

## 2020-02-22 DIAGNOSIS — Z510 Encounter for antineoplastic radiation therapy: Secondary | ICD-10-CM | POA: Diagnosis not present

## 2020-02-23 NOTE — Progress Notes (Signed)
[facility] OFFICE PROGRESS NOTE  Crist Infante, MD [place] [address]  DIAGNOSIS: F/u ofSquamous cell carcinoma of the anus  Oncology History Overview Note  Cancer Staging No matching staging information was found for the patient.    Squamous cell cancer, anus ([place])  07/13/2019 Procedure    EUA, Excision of posterior internal/external hemorrhoid under the care of Dr. Hassell Done    07/13/2019 Pathology Results   - Invasive moderately differentiated squamous cell carcinoma, 1.4 cm.  See comment  - Carcinoma invades for depth of 0.4 cm  - Deep resection margin is negative for carcinoma (0.2 cm)  - Lateral mucosal margin is positive for high-grade dysplasia  - No evidence of lymphovascular perineural invasion  Procedure: Local excision  Tumor Site: Anal canal  Tumor Size: 1.4 cm  Histologic Type: Invasive squamous cell carcinoma  Histologic Grade: G2: Moderately differentiated  Tumor Extension: Carcinoma invades superficial anal sphincter muscle  Margins: Uninvolved by tumor  Treatment Effect: N/A  Regional Lymph Nodes: No lymph nodes submitted or found  Pathologic Stage Classification (pTNM, AJCC 8th Edition):  pT1, pNx  Representative Tumor Block: A1  Comment(s): Lateral mucosal margin is positive for high-grade squamous  dysplasia      07/13/2019 Initial Diagnosis   Squamous cell cancer, anus ([place])   09/04/2019 Imaging   CT scan Chest, Abdomen, and Pelvis   IMPRESSION: 1. No evidence of metastatic disease in the chest, abdomen or pelvis. 2. No discrete anorectal mass. 3. Chronic findings include: Punctate nonobstructing upper right renal stones and chronic right renal scarring. 4. Aortic Atherosclerosis (ICD10-I70.0).   01/10/2020 Pathology Results   A. ANAL LESION, POSTERIOR MIDLINE, EXCISION:  - Squamous cell carcinoma, moderately differentiated.  Verbally reported by Dr. Saralyn Pilar 1.5 cm, negative margins, depth <1 mm   01/11/2020  Procedure   1. Excision of anal canal lesion (posterior midline) under the care of Dr. Dema Severin     01/28/2020 PET scan   IMPRESSION: 1. Mild focal anal hypermetabolism without discrete mass correlate on the CT images, nonspecific, differential includes postsurgical change or residual anal tumor. 2. No hypermetabolic locoregional or distant metastatic disease. 3. Chronic findings include: Aortic Atherosclerosis (ICD10-I70.0). Diffuse hepatic steatosis. Nonobstructing right nephrolithiasis.     02/04/2020 -  Radiation Therapy   concurrent chemoRT by Dr Lisbeth Renshaw with Mitomycin and 5FU starting 02/04/20.    02/04/2020 -  Chemotherapy   Concurrent chemoRT with Mitomycin and 5FU on week 1 and 5 starting 02/04/20.      CURRENT THERAPY: Concurrent chemoRT withMitomycin and 5FU on week 1 and 5starting 02/04/20  INTERVAL HISTORY: Melinda [name] 59 y.o. female returns to clinic today for a follow-up visit accompanied by her husband.  The patient's main concern today is related to abdominal cramping and discomfort.  She was seen in the symptom management clinic last week and was given a prescription for hyoscyamine for her abdominal spasms. She describes her discomfort as sharp  Unfortunately, the pharmacy did not have this medication on hand and it is expected to be available at the pharmacy today to be picked up.  The patient also reports intermittent diarrhea for which she takes Imodium. The patient denies any fever, chills, or night sweats.  Her weight has been stable, although she endorses a decreased appetite.  She has been eating a lot of carbs and soft foods recently including potatoes and soups.  She also drinks Ensure if she is not hungry for meal.  She had some mild  mouth soreness following her first cycle of treatment.  A prescription for Magic mouthwash was called into her pharmacy but was unavailable for pickup due to it needing to be compounded.  She is expecting to be able to pick up this  prescription before starting her second chemotherapy treatment next week.  She reports mild nausea which is controlled with ginger ale.  She denies vomiting.  She reports mild rectal bleeding with wiping.  She is getting radiation irritation was given a prescription for Silvadene cream last week.  She is here today for evaluation and repeat blood work.  MEDICAL HISTORY: Past Medical History:  Diagnosis Date  . Diabetes mellitus type 2, controlled ([place])   . Dyslipidemia    untreated  . GERD (gastroesophageal reflux disease)   . Hemorrhoids   . Microcytic anemia 06/22/2012  . Nephrolith   . Palpitation    positive tilt table test. Prozac and Inderal treatment effective.    ALLERGIES:  is allergic to cephalexin, amoxapine and related, amoxicillin-pot clavulanate, atorvastatin, cefaclor, ciprofloxacin, codeine, doxycycline calcium, fenofibrate micronized, fish oil, flagyl [metronidazole hcl], levofloxacin in d5w, metronidazole, ofloxacin, prednisolone, promethazine hcl, tetracycline hcl, erythromycin, and nitrofurantoin.  MEDICATIONS:  Current Outpatient Medications  Medication Sig Dispense Refill  . clorazepate (TRANXENE) 7.5 MG tablet Take 7.5 mg by mouth 2 (two) times daily.  1  . CRESTOR 5 MG tablet Take 5 mg by mouth every evening.   1  . esomeprazole (NEXIUM) 40 MG capsule Take 40 mg by mouth daily at 12 noon.    [name] FLUoxetine (PROZAC) 20 MG capsule Take 20 mg by mouth every morning.    . Loperamide HCl (IMODIUM PO) Take 1 capsule by mouth every 6 (six) hours as needed.    . metFORMIN (GLUCOPHAGE) 500 MG tablet Take by mouth 2 (two) times daily with a meal.    . methenamine (HIPREX) 1 g tablet Take 0.5 g by mouth daily.     [name] NU-IRON 150 MG capsule Take 150 mg by mouth daily.   2  . ondansetron (ZOFRAN) 8 MG tablet Take 1 tablet (8 mg total) by mouth 2 (two) times daily as needed (Nausea or vomiting). 30 tablet 1  . polyvinyl alcohol (LIQUIFILM TEARS) 1.4 % ophthalmic solution Place 1  drop into both eyes as needed for dry eyes.    [name] prochlorperazine (COMPAZINE) 10 MG tablet Take 1 tablet (10 mg total) by mouth every 6 (six) hours as needed (Nausea or vomiting). 30 tablet 1  . propranolol (INDERAL) 10 MG tablet TAKE 1 TABLET BY MOUTH TWICE A DAY (Patient taking differently: Take 10 mg by mouth 2 (two) times daily. ) 180 tablet 2  . silver sulfADIAZINE (SILVADENE) 1 % cream Apply 1 application topically daily. 400 g 0  . diphenoxylate-atropine (LOMOTIL) 2.5-0.025 MG tablet Take 1-2 tablets by mouth 4 (four) times daily as needed for diarrhea or loose stools. (Patient not taking: Reported on 02/25/2020) 60 tablet 1  . hyoscyamine (ANASPAZ) 0.125 MG TBDP disintergrating tablet Place 1 tablet (0.125 mg total) under the tongue every 6 (six) hours as needed. 30 tablet 2  . ibuprofen (ADVIL) 800 MG tablet Take 1 tablet (800 mg total) by mouth every 8 (eight) hours as needed. (Patient not taking: Reported on 01/10/2020) 30 tablet 0  . sulfamethoxazole-trimethoprim (BACTRIM) 400-80 MG tablet Take 2 tablets by mouth 2 (two) times daily. 20 tablet 0   No current facility-administered medications for this visit.    SURGICAL HISTORY:  Past Surgical History:  Procedure Laterality Date  . APPENDECTOMY    . BREAST REDUCTION SURGERY    . CARDIAC CATHETERIZATION  2002   normal LV function and no significant coronary obstruction  . CATARACT EXTRACTION, BILATERAL Bilateral november 2020 and dcember 2020  . CYST EXCISION N/A 01/11/2020   Procedure: EXCISION OF ANAL CANAL MASS;  Surgeon: Ileana Roup, MD;  Location: [facility];  Service: General;  Laterality: N/A;  . ESOPHAGEAL MANOMETRY N/A 04/04/2017   Procedure: ESOPHAGEAL MANOMETRY (EM);  Surgeon: Laurence Spates, MD;  Location: [facility];  Service: Endoscopy;  Laterality: N/A;  . HEMORRHOID SURGERY N/A 07/13/2019   Procedure: HEMORRHOIDECTOMY;  Surgeon: Johnathan Hausen, MD;  Location: [facility];  Service: General;   Laterality: N/A;  . KIDNEY SURGERY     kidney stones   . PARTIAL HYSTERECTOMY    . RECTAL EXAM UNDER ANESTHESIA N/A 01/11/2020   Procedure: RECTAL EXAM UNDER ANESTHESIA;  Surgeon: Ileana Roup, MD;  Location: [facility];  Service: General;  Laterality: N/A;  . TONSILLECTOMY      REVIEW OF SYSTEMS:   Review of Systems  Constitutional: Positive for decreased appetite, fatigue, and generalized weakness.  Negative for chills and fever. HENT: Positive for mild mouth soreness (improved).  Negative for  nosebleeds, sore throat and trouble swallowing.   Eyes: Negative for eye problems and icterus.  Respiratory: Negative for cough, hemoptysis, shortness of breath and wheezing.   Cardiovascular: Negative for chest pain and leg swelling.  Gastrointestinal: Positive for abdominal discomfort, diarrhea, and mild nausea. Negative for constipation and vomiting.  Genitourinary: Negative for bladder incontinence, difficulty urinating, dysuria, frequency and hematuria.   Musculoskeletal: Negative for back pain, gait problem, neck pain and neck stiffness.  Skin: Negative for itching and rash.  Neurological: Negative for dizziness, extremity weakness, gait problem, headaches, light-headedness and seizures.  Hematological: Negative for adenopathy. Does not bruise/bleed easily.  Psychiatric/Behavioral: Negative for confusion, depression and sleep disturbance. The patient is not nervous/anxious.     PHYSICAL EXAMINATION:  Blood pressure 127/82, pulse 73, temperature 97.7 F (36.5 C), temperature source Tympanic, resp. rate 18, height '5\' 6"'  (1.676 m), weight 158 lb 4.8 oz (71.8 kg), SpO2 98 %.  ECOG PERFORMANCE STATUS: 1 - Symptomatic but completely ambulatory  Physical Exam  Constitutional: Oriented to person, place, and time and well-developed, well-nourished, and in no distress.  HENT:  Head: Normocephalic and atraumatic.  Mouth/Throat: Oropharynx is clear and moist. No oropharyngeal exudate.  Eyes:  Conjunctivae are normal. Right eye exhibits no discharge. Left eye exhibits no discharge. No scleral icterus.  Neck: Normal range of motion. Neck supple.  Cardiovascular: Normal rate, regular rhythm, normal heart sounds and intact distal pulses.   Pulmonary/Chest: Effort normal and breath sounds normal. No respiratory distress. No wheezes. No rales.  Abdominal: Soft. Bowel sounds are normal. Exhibits no distension and no mass. Mild generalized abdominal tenderness. No rebound tenderness or guarding.  Musculoskeletal: Normal range of motion. Exhibits no edema.  Lymphadenopathy:    No cervical adenopathy.  Neurological: Alert and oriented to person, place, and time. Exhibits normal muscle tone. Gait normal. Coordination normal.  Skin: Skin is warm and dry. No rash noted. Not diaphoretic. No erythema. No pallor.  Psychiatric: Mood, memory and judgment normal.  Vitals reviewed.  LABORATORY DATA: Lab Results  Component Value Date   WBC 2.4 (L) 02/25/2020   HGB 11.7 (L) 02/25/2020   HCT 36.5 02/25/2020   MCV 81.8 02/25/2020   PLT 161 02/25/2020  Chemistry      Component Value Date/Time   NA 140 02/25/2020 1356   K 4.5 02/25/2020 1356   CL 106 02/25/2020 1356   CO2 28 02/25/2020 1356   BUN 14 02/25/2020 1356   CREATININE 0.92 02/25/2020 1356      Component Value Date/Time   CALCIUM 9.8 02/25/2020 1356   ALKPHOS 45 02/25/2020 1356   AST 23 02/25/2020 1356   ALT 34 02/25/2020 1356   BILITOT 0.3 02/25/2020 1356       RADIOGRAPHIC STUDIES:  NM PET Image Initial (PI) Skull Base To Thigh  Result Date: 01/28/2020 CLINICAL DATA:  Initial treatment strategy for T1N0M0 anal squamous cell carcinoma with recent excision of a second muscle invasive focus. Planning concurrent chemoradiation therapy. EXAM: NUCLEAR MEDICINE PET SKULL BASE TO THIGH TECHNIQUE: 8.2 mCi F-18 FDG was injected intravenously. Full-ring PET imaging was performed from the skull base to thigh after the radiotracer.  CT data was obtained and used for attenuation correction and anatomic localization. Fasting blood glucose: 120 mg/dl COMPARISON:  09/04/2019 CT chest, abdomen and pelvis. FINDINGS: Mediastinal blood pool activity: SUV max 2.6 Liver activity: SUV max NA NECK: No hypermetabolic lymph nodes in the neck. Incidental CT findings: none CHEST: No enlarged or hypermetabolic axillary, mediastinal or hilar lymph nodes. No hypermetabolic pulmonary findings. Incidental CT findings: A few scattered small solid pulmonary nodules, largest 5 mm in the posterior right lower lobe (series 8/image 44), all below PET resolution and unchanged since 09/25/2012 chest CT as described on 09/04/2019 chest CT report, considered benign. No new significant pulmonary nodules. ABDOMEN/PELVIS: No abnormal hypermetabolic activity within the liver, pancreas, adrenal glands, or spleen. No hypermetabolic lymph nodes in the abdomen or pelvis. Mild focal anal hypermetabolism with max SUV 5.0 without discrete mass correlate on the CT images. Incidental CT findings: Diffuse hepatic steatosis. Hysterectomy. Atherosclerotic nonaneurysmal abdominal aorta. Nonobstructing scattered small right renal stones, largest 3 mm in the upper right kidney. SKELETON: No focal hypermetabolic activity to suggest skeletal metastasis. Vague mild hypermetabolism between the L2 and L3 spinous processes with associated degenerative changes and no discrete bone lesion on the CT images, compatible with degenerative uptake. Incidental CT findings: none IMPRESSION: 1. Mild focal anal hypermetabolism without discrete mass correlate on the CT images, nonspecific, differential includes postsurgical change or residual anal tumor. 2. No hypermetabolic locoregional or distant metastatic disease. 3. Chronic findings include: Aortic Atherosclerosis (ICD10-I70.0). Diffuse hepatic steatosis. Nonobstructing right nephrolithiasis. Electronically Signed   By: Ilona Sorrel M.D.   On: 01/28/2020  15:30   DG Foot Complete Left  Result Date: 02/15/2020 CLINICAL DATA:  Left great toe injury laceration EXAM: LEFT FOOT - COMPLETE 3+ VIEW COMPARISON:  None. FINDINGS: There is no evidence of fracture or dislocation. There is no evidence of arthropathy or other focal bone abnormality. Soft tissues are unremarkable. IMPRESSION: Negative. Electronically Signed   By: Donavan Foil M.D.   On: 02/15/2020 21:45   IR PICC PLACEMENT RIGHT >5 YRS INC IMG GUIDE  Result Date: 02/04/2020 INDICATION: Patient with rectal cancer requiring reliable access for chemotherapy. Interventional Radiology asked to place a PICC. EXAM: ULTRASOUND AND FLUOROSCOPIC GUIDED PICC LINE INSERTION MEDICATIONS: 1% lidocaine, 1 ml CONTRAST:  None FLUOROSCOPY TIME:  36 seconds (4 mGy) COMPLICATIONS: None immediate. TECHNIQUE: The procedure, risks, benefits, and alternatives were explained to the patient and informed written consent was obtained. The right upper extremity was prepped with chlorhexidine in a sterile fashion, and a sterile drape was applied covering the operative field.  Maximum barrier sterile technique with sterile gowns and gloves were used for the procedure. A timeout was performed prior to the initiation of the procedure. Local anesthesia was provided with 1% lidocaine. After the overlying soft tissues were anesthetized with 1% lidocaine, a micropuncture kit was utilized to access the right basilic vein. Real-time ultrasound guidance was utilized for vascular access including the acquisition of a permanent ultrasound image documenting patency of the accessed vessel. A guidewire was advanced to the level of the superior caval-atrial junction for measurement purposes and the PICC line was cut to length. A peel-away sheath was placed and a 34 cm, 5 [place], single lumen was inserted to level of the superior caval-atrial junction. A post procedure spot fluoroscopic was obtained. The catheter easily aspirated and flushed and was  secured in place. A dressing was placed. The patient tolerated the procedure well without immediate post procedural complication. FINDINGS: After catheter placement, the tip lies within the superior cavoatrial junction. The catheter aspirates and flushes normally and is ready for immediate use. IMPRESSION: Successful ultrasound and fluoroscopic guided placement of a right basilic vein approach, 34 cm, 5 French, single lumen PICC with tip at the superior caval-atrial junction. The PICC line is ready for immediate use. Read by: Soyla Dryer, NP Electronically Signed   By: Sandi Mariscal M.D.   On: 02/04/2020 13:35     ASSESSMENT/PLAN:  Melinda [name] is a 59 y.o. female with    1.Recurrent squamous cell carcinoma the anus, pT1N0M0 -She initially presented to Dr Theophilus Bones hemorrhoidectomyand Dema Severin 2/5/21withinvasive moderately differentiated squamous cell carcinoma, 1.4 cm,pT1, pNx. -She had local recurrence,underwent surgical excision under the care of Dr. Dema Severin on 01/11/20.Surgical pathwas consistent with squamous cell carcinoma of the anus.01/28/11 staging PETshowed nonode ordistant metastasis. -Dr. Roylene Reason standard treatment for anal cancer includesconcurrent chemoRTfor 6 weeks with Mitomycin and 5FU on week 1 and 5.First dose on8/30/21.  -S/p week 2 her diarrhea worsened with cramping. When she tried lomotil she had 99.72F fever and right skin erythema of her right breast. Based on examon 02/18/20 she likely has right breast skin infection. Resolved.  -Labs reviewed, WBC 2.4, ANC 1.3, plt 161K, Hg 11.7, BG 99.   -Continue Radiation  -F/u next week -Will place order for PICC line placement to be performed prior to chemotherapy next week.    2. Diarrhea, abdominal spasms Nausea, Taste Change, secondary to chemoRT, Right breast cellulitis  -On week 1 day she started having mild diarrhea after a bout of constipation. She also had mild nausea. She did not require imodium  or antiemetics.  -After week 2 her diarrhea has worsened and occurs after eating or drinking. She has eaten much less for this.  -She tried Lomotil once 2-3 days ago but stopped after 99.72F fever and skin redness of right breast appeared with nipple burning. Her breast symptoms much improved now. -She has mild skin erythema of her right breast remaining on exam (02/08/20). Dr. Burr Medico discussed this could possibly be infection of her right breast, although not very common.  Dr. Burr Medico prescribed Bactrim 2 tabs BID, given she has allergy to multiple antibiotics. She can reduce to 1 tab BID if not tolerable. She has been taking 1 tablet due to intolerance. -She tried imodium 4 times. Dr. Burr Medico previously recommend she increase up to 8 times a day. -following with Dietician. -Diarrhea controlled with imodium -Patient seen in [facility] on 02/21/20 and complaining for abdominal spasms. Te patient was prescribed hyoscyamine and she is expected to pick  this up today.   3.Iron deficiency anemia -Etiology unclear as to her iron deficiency. S/Physterectomy -Currently managed by PCP, Dr. Joylene Draft. She receives iron infusions with Feraheme PRN, last in 12/2019. -Mild, stable.   4. Diabetes Mellitis  -Continue to f/u with PCP for management.   5. Neutropenia, Thrombocytopenia  -secondary to chemo, onset after dose 1 Mitomycin and 5FU.  -Her WBC  2.4,  ANC 1.3, platelet count 161k.   6. Radiation skin irritation -The patient was given a prescription of silvadene  -Will continue to follow with Dr. Lisbeth Renshaw.   Plan: -Place order for PICC line placement next week -Labs, F/U with Dr. Burr Medico, and infusion next week as scheduled on 03/03/20 with PICC removal on 03/07/20.    Orders Placed This Encounter  Procedures  . IR PICC PLACEMENT RIGHT <5 [place]    Patient requesting Roselyn Reef if possible for PICC line placement. Please schedule this on 03/03/20. She has chemo at the cancer center at 12:45 so must be before her  appointment.    Standing Status:   Future    Standing Expiration Date:   02/24/2021    Order Specific Question:   Reason for Exam (SYMPTOM  OR DIAGNOSIS REQUIRED)    Answer:   Please place PICC in upper extremity on 03/03/20 for her chemotherapy which is scheduled at 12:45 this day    Order Specific Question:   Preferred Imaging Location?    Answer:   [place], PA-C 02/25/20

## 2020-02-25 ENCOUNTER — Other Ambulatory Visit: Payer: Self-pay

## 2020-02-25 ENCOUNTER — Ambulatory Visit
Admission: RE | Admit: 2020-02-25 | Discharge: 2020-02-25 | Disposition: A | Discharge status: Home / Self Care | Payer: BC Managed Care – PPO | Source: Ambulatory Visit | Attending: Radiation Oncology | Admitting: Radiation Oncology

## 2020-02-25 ENCOUNTER — Inpatient Hospital Stay (HOSPITAL_BASED_OUTPATIENT_CLINIC_OR_DEPARTMENT_OTHER): Payer: BC Managed Care – PPO | Admitting: Physician Assistant

## 2020-02-25 ENCOUNTER — Encounter: Payer: Self-pay | Admitting: General Practice

## 2020-02-25 ENCOUNTER — Inpatient Hospital Stay: Payer: BC Managed Care – PPO

## 2020-02-25 ENCOUNTER — Telehealth: Payer: Self-pay | Admitting: Hematology

## 2020-02-25 VITALS — BP 127/82 | HR 73 | Temp 97.7°F | Resp 18 | Ht 66.0 in | Wt 158.3 lb

## 2020-02-25 DIAGNOSIS — C210 Malignant neoplasm of anus, unspecified: Secondary | ICD-10-CM

## 2020-02-25 DIAGNOSIS — C218 Malignant neoplasm of overlapping sites of rectum, anus and anal canal: Secondary | ICD-10-CM | POA: Diagnosis not present

## 2020-02-25 DIAGNOSIS — Z7984 Long term (current) use of oral hypoglycemic drugs: Secondary | ICD-10-CM | POA: Diagnosis not present

## 2020-02-25 DIAGNOSIS — Z5111 Encounter for antineoplastic chemotherapy: Secondary | ICD-10-CM | POA: Diagnosis not present

## 2020-02-25 DIAGNOSIS — Z510 Encounter for antineoplastic radiation therapy: Secondary | ICD-10-CM | POA: Diagnosis not present

## 2020-02-25 DIAGNOSIS — R197 Diarrhea, unspecified: Secondary | ICD-10-CM | POA: Diagnosis not present

## 2020-02-25 DIAGNOSIS — E119 Type 2 diabetes mellitus without complications: Secondary | ICD-10-CM | POA: Diagnosis not present

## 2020-02-25 DIAGNOSIS — D709 Neutropenia, unspecified: Secondary | ICD-10-CM | POA: Diagnosis not present

## 2020-02-25 DIAGNOSIS — D509 Iron deficiency anemia, unspecified: Secondary | ICD-10-CM | POA: Diagnosis not present

## 2020-02-25 LAB — CBC WITH DIFFERENTIAL (CANCER CENTER ONLY)
Abs Immature Granulocytes: 0.03 10*3/uL (ref 0.00–0.07)
Basophils Absolute: 0.1 10*3/uL (ref 0.0–0.1)
Basophils Relative: 2 %
Eosinophils Absolute: 0.1 10*3/uL (ref 0.0–0.5)
Eosinophils Relative: 2 %
HCT: 36.5 % (ref 36.0–46.0)
Hemoglobin: 11.7 g/dL — ABNORMAL LOW (ref 12.0–15.0)
Immature Granulocytes: 1 %
Lymphocytes Relative: 21 %
Lymphs Abs: 0.5 10*3/uL — ABNORMAL LOW (ref 0.7–4.0)
MCH: 26.2 pg (ref 26.0–34.0)
MCHC: 32.1 g/dL (ref 30.0–36.0)
MCV: 81.8 fL (ref 80.0–100.0)
Monocytes Absolute: 0.4 10*3/uL (ref 0.1–1.0)
Monocytes Relative: 19 %
Neutro Abs: 1.3 10*3/uL — ABNORMAL LOW (ref 1.7–7.7)
Neutrophils Relative %: 55 %
Platelet Count: 161 10*3/uL (ref 150–400)
RBC: 4.46 MIL/uL (ref 3.87–5.11)
RDW: 18.7 % — ABNORMAL HIGH (ref 11.5–15.5)
WBC Count: 2.4 10*3/uL — ABNORMAL LOW (ref 4.0–10.5)
nRBC: 0 % (ref 0.0–0.2)

## 2020-02-25 LAB — CMP (CANCER CENTER ONLY)
ALT: 34 U/L (ref 0–44)
AST: 23 U/L (ref 15–41)
Albumin: 3.7 g/dL (ref 3.5–5.0)
Alkaline Phosphatase: 45 U/L (ref 38–126)
Anion gap: 6 (ref 5–15)
BUN: 14 mg/dL (ref 8–23)
CO2: 28 mmol/L (ref 22–32)
Calcium: 9.8 mg/dL (ref 8.9–10.3)
Chloride: 106 mmol/L (ref 98–111)
Creatinine: 0.92 mg/dL (ref 0.44–1.00)
GFR, Est AFR Am: >60 mL/min (ref >60)
GFR, Estimated: >60 mL/min (ref >60)
Glucose, Bld: 99 mg/dL (ref 70–99)
Potassium: 4.5 mmol/L (ref 3.5–5.1)
Sodium: 140 mmol/L (ref 135–145)
Total Bilirubin: 0.3 mg/dL (ref 0.3–1.2)
Total Protein: 6.9 g/dL (ref 6.5–8.1)

## 2020-02-25 NOTE — Telephone Encounter (Signed)
Scheduled appointment per 9/20 los. Spoke with patient in person who is aware of appointments. Gave patient updated calendar.

## 2020-02-25 NOTE — Progress Notes (Signed)
[facility] Spiritual Care Note  Left voicemail of care and encouragement, requesting return call.   [place], [place], [facility] Pager [phone] Voicemail [phone]

## 2020-02-26 ENCOUNTER — Ambulatory Visit
Admission: RE | Admit: 2020-02-26 | Discharge: 2020-02-26 | Disposition: A | Discharge status: Home / Self Care | Payer: BC Managed Care – PPO | Source: Ambulatory Visit | Attending: Radiation Oncology | Admitting: Radiation Oncology

## 2020-02-26 ENCOUNTER — Telehealth: Payer: Self-pay | Admitting: Radiation Oncology

## 2020-02-26 ENCOUNTER — Other Ambulatory Visit: Payer: Self-pay

## 2020-02-26 DIAGNOSIS — Z510 Encounter for antineoplastic radiation therapy: Secondary | ICD-10-CM | POA: Diagnosis not present

## 2020-02-26 DIAGNOSIS — C210 Malignant neoplasm of anus, unspecified: Secondary | ICD-10-CM | POA: Diagnosis not present

## 2020-02-26 NOTE — Telephone Encounter (Signed)
The patient called and has had increasing pain in her anal region. We discussed dermoplast and prn tramadol which she already has. She is hoping to avoid norco/oxycodone based products out of concern for dependence. I assured her that if we had to start these medications we would not anticipate a long duration of therapy. She is in agreement.

## 2020-02-27 ENCOUNTER — Telehealth: Payer: Self-pay | Admitting: Radiation Oncology

## 2020-02-27 ENCOUNTER — Other Ambulatory Visit: Payer: Self-pay

## 2020-02-27 ENCOUNTER — Ambulatory Visit
Admission: RE | Admit: 2020-02-27 | Discharge: 2020-02-27 | Disposition: A | Discharge status: Home / Self Care | Payer: BC Managed Care – PPO | Source: Ambulatory Visit | Attending: Radiation Oncology | Admitting: Radiation Oncology

## 2020-02-27 DIAGNOSIS — C210 Malignant neoplasm of anus, unspecified: Secondary | ICD-10-CM | POA: Diagnosis not present

## 2020-02-27 DIAGNOSIS — Z510 Encounter for antineoplastic radiation therapy: Secondary | ICD-10-CM | POA: Diagnosis not present

## 2020-02-27 NOTE — Telephone Encounter (Signed)
I spoke with the patient and she has had progressive symptoms of diarrhea and perineal itching. Dermoplast has not helped. We reviewed immodium dosing, topical benadryl cream (without hydrocortisone) per Dr. Lisbeth Renshaw. She is taking anaspaz from symptom management clinic. She is interested in discontinuing treatment on 03/07/20. She will discuss this further with Dr. Lisbeth Renshaw on Friday.

## 2020-02-28 ENCOUNTER — Other Ambulatory Visit: Payer: Self-pay

## 2020-02-28 ENCOUNTER — Other Ambulatory Visit (HOSPITAL_COMMUNITY): Payer: BC Managed Care – PPO

## 2020-02-28 ENCOUNTER — Ambulatory Visit
Admission: RE | Admit: 2020-02-28 | Discharge: 2020-02-28 | Disposition: A | Discharge status: Home / Self Care | Payer: BC Managed Care – PPO | Source: Ambulatory Visit | Attending: Radiation Oncology | Admitting: Radiation Oncology

## 2020-02-28 DIAGNOSIS — Z510 Encounter for antineoplastic radiation therapy: Secondary | ICD-10-CM | POA: Diagnosis not present

## 2020-02-28 DIAGNOSIS — C210 Malignant neoplasm of anus, unspecified: Secondary | ICD-10-CM | POA: Diagnosis not present

## 2020-02-29 ENCOUNTER — Other Ambulatory Visit: Payer: Self-pay

## 2020-02-29 ENCOUNTER — Ambulatory Visit
Admission: RE | Admit: 2020-02-29 | Discharge: 2020-02-29 | Disposition: A | Discharge status: Home / Self Care | Payer: BC Managed Care – PPO | Source: Ambulatory Visit | Attending: Radiation Oncology | Admitting: Radiation Oncology

## 2020-02-29 DIAGNOSIS — C210 Malignant neoplasm of anus, unspecified: Secondary | ICD-10-CM | POA: Diagnosis not present

## 2020-02-29 DIAGNOSIS — Z510 Encounter for antineoplastic radiation therapy: Secondary | ICD-10-CM | POA: Diagnosis not present

## 2020-02-29 NOTE — Progress Notes (Signed)
[facility]   [phone] Fax:([phone]   Clinic Follow up Note   Patient Care Team: Crist Infante, MD as PCP - General (Internal Medicine) Fay Records, MD as PCP - Cardiology (Cardiology) Nobie Putnam, MD (Hematology and Oncology) Heilingoetter, Tobe Sos, PA-C as Physician Assistant (Oncology) Truitt Merle, MD as Consulting Physician (Oncology) Jonnie Finner, RN as Oncology Nurse Navigator  Date of Service:  03/03/2020  CHIEF COMPLAINT: F/u ofSquamous cell carcinoma of the anus  SUMMARY OF ONCOLOGIC HISTORY: Oncology History Overview Note  Cancer Staging No matching staging information was found for the patient.    Squamous cell cancer, anus ([place])  07/13/2019 Procedure    EUA, Excision of posterior internal/external hemorrhoid under the care of Dr. Hassell Done    07/13/2019 Pathology Results   - Invasive moderately differentiated squamous cell carcinoma, 1.4 cm.  See comment  - Carcinoma invades for depth of 0.4 cm  - Deep resection margin is negative for carcinoma (0.2 cm)  - Lateral mucosal margin is positive for high-grade dysplasia  - No evidence of lymphovascular perineural invasion  Procedure: Local excision  Tumor Site: Anal canal  Tumor Size: 1.4 cm  Histologic Type: Invasive squamous cell carcinoma  Histologic Grade: G2: Moderately differentiated  Tumor Extension: Carcinoma invades superficial anal sphincter muscle  Margins: Uninvolved by tumor  Treatment Effect: N/A  Regional Lymph Nodes: No lymph nodes submitted or found  Pathologic Stage Classification (pTNM, AJCC 8th Edition):  pT1, pNx  Representative Tumor Block: A1  Comment(s): Lateral mucosal margin is positive for high-grade squamous  dysplasia      07/13/2019 Initial Diagnosis   Squamous cell cancer, anus ([place])   09/04/2019 Imaging   CT scan Chest, Abdomen, and Pelvis   IMPRESSION: 1. No evidence of metastatic disease in the chest, abdomen or pelvis. 2. No  discrete anorectal mass. 3. Chronic findings include: Punctate nonobstructing upper right renal stones and chronic right renal scarring. 4. Aortic Atherosclerosis (ICD10-I70.0).   01/10/2020 Pathology Results   A. ANAL LESION, POSTERIOR MIDLINE, EXCISION:  - Squamous cell carcinoma, moderately differentiated.  Verbally reported by Dr. Saralyn Pilar 1.5 cm, negative margins, depth <1 mm   01/11/2020 Procedure   1. Excision of anal canal lesion (posterior midline) under the care of Dr. Dema Severin     01/28/2020 PET scan   IMPRESSION: 1. Mild focal anal hypermetabolism without discrete mass correlate on the CT images, nonspecific, differential includes postsurgical change or residual anal tumor. 2. No hypermetabolic locoregional or distant metastatic disease. 3. Chronic findings include: Aortic Atherosclerosis (ICD10-I70.0). Diffuse hepatic steatosis. Nonobstructing right nephrolithiasis.     02/04/2020 -  Radiation Therapy   concurrent chemoRT by Dr Lisbeth Renshaw with Mitomycin and 5FU starting 02/04/20.    02/04/2020 -  Chemotherapy   Concurrent chemoRT with Mitomycin and 5FU on week 1 and 5 starting 02/04/20.       CURRENT THERAPY:  Concurrent chemoRT withMitomycin and 5FU on week 1 and 5starting 02/04/20  INTERVAL HISTORY:  Melinda [name] is here for a follow up. She presents to the clinic alone.  She has developed a worsening rectal pain, diarrhea, skin irritation in the perianal area and bilateral groin area, with mild bleeding.  She cannot get comfortable today sitting for long time.  She has intermittent lower abdominal pain.  She is able to drink fluids adequately, eating moderately well.  Weight is stable overall.  She noticed intermittent dizziness this morning, no fever or chills.  Her blood pressure has  been normal.  All other systems were reviewed with the patient and are negative.  MEDICAL HISTORY:  Past Medical History:  Diagnosis Date  . Diabetes mellitus type 2, controlled ([place])     . Dyslipidemia    untreated  . GERD (gastroesophageal reflux disease)   . Hemorrhoids   . Microcytic anemia 06/22/2012  . Nephrolith   . Palpitation    positive tilt table test. Prozac and Inderal treatment effective.    SURGICAL HISTORY: Past Surgical History:  Procedure Laterality Date  . APPENDECTOMY    . BREAST REDUCTION SURGERY    . CARDIAC CATHETERIZATION  2002   normal LV function and no significant coronary obstruction  . CATARACT EXTRACTION, BILATERAL Bilateral november 2020 and dcember 2020  . CYST EXCISION N/A 01/11/2020   Procedure: EXCISION OF ANAL CANAL MASS;  Surgeon: Ileana Roup, MD;  Location: [facility];  Service: General;  Laterality: N/A;  . ESOPHAGEAL MANOMETRY N/A 04/04/2017   Procedure: ESOPHAGEAL MANOMETRY (EM);  Surgeon: Laurence Spates, MD;  Location: [facility];  Service: Endoscopy;  Laterality: N/A;  . HEMORRHOID SURGERY N/A 07/13/2019   Procedure: HEMORRHOIDECTOMY;  Surgeon: Johnathan Hausen, MD;  Location: [facility];  Service: General;  Laterality: N/A;  . KIDNEY SURGERY     kidney stones   . PARTIAL HYSTERECTOMY    . RECTAL EXAM UNDER ANESTHESIA N/A 01/11/2020   Procedure: RECTAL EXAM UNDER ANESTHESIA;  Surgeon: Ileana Roup, MD;  Location: [facility];  Service: General;  Laterality: N/A;  . TONSILLECTOMY      I have reviewed the social history and family history with the patient and they are unchanged from previous note.  ALLERGIES:  is allergic to cephalexin, amoxapine and related, amoxicillin-pot clavulanate, atorvastatin, cefaclor, ciprofloxacin, codeine, doxycycline calcium, fenofibrate micronized, fish oil, flagyl [metronidazole hcl], levofloxacin in d5w, metronidazole, ofloxacin, prednisolone, promethazine hcl, tetracycline hcl, erythromycin, and nitrofurantoin.  MEDICATIONS:  Current Outpatient Medications  Medication Sig Dispense Refill  . clorazepate (TRANXENE) 7.5 MG tablet Take 7.5 mg by mouth 2 (two) times daily.   1  . CRESTOR 5 MG tablet Take 5 mg by mouth every evening.   1  . diphenoxylate-atropine (LOMOTIL) 2.5-0.025 MG tablet Take 1-2 tablets by mouth 4 (four) times daily as needed for diarrhea or loose stools. (Patient not taking: Reported on 02/25/2020) 60 tablet 1  . esomeprazole (NEXIUM) 40 MG capsule Take 40 mg by mouth daily at 12 noon.    [name] FLUoxetine (PROZAC) 20 MG capsule Take 20 mg by mouth every morning.    . hyoscyamine (ANASPAZ) 0.125 MG TBDP disintergrating tablet Place 1 tablet (0.125 mg total) under the tongue every 6 (six) hours as needed. 30 tablet 2  . ibuprofen (ADVIL) 800 MG tablet Take 1 tablet (800 mg total) by mouth every 8 (eight) hours as needed. (Patient not taking: Reported on 01/10/2020) 30 tablet 0  . Loperamide HCl (IMODIUM PO) Take 1 capsule by mouth every 6 (six) hours as needed.    . metFORMIN (GLUCOPHAGE) 500 MG tablet Take by mouth 2 (two) times daily with a meal.    . methenamine (HIPREX) 1 g tablet Take 0.5 g by mouth daily.     [name] NU-IRON 150 MG capsule Take 150 mg by mouth daily.   2  . ondansetron (ZOFRAN) 8 MG tablet Take 1 tablet (8 mg total) by mouth 2 (two) times daily as needed (Nausea or vomiting). 30 tablet 1  . polyvinyl alcohol (LIQUIFILM TEARS) 1.4 % ophthalmic solution  Place 1 drop into both eyes as needed for dry eyes.    [name] prochlorperazine (COMPAZINE) 10 MG tablet Take 1 tablet (10 mg total) by mouth every 6 (six) hours as needed (Nausea or vomiting). 30 tablet 1  . propranolol (INDERAL) 10 MG tablet TAKE 1 TABLET BY MOUTH TWICE A DAY (Patient taking differently: Take 10 mg by mouth 2 (two) times daily. ) 180 tablet 2  . silver sulfADIAZINE (SILVADENE) 1 % cream Apply 1 application topically daily. 400 g 0  . sulfamethoxazole-trimethoprim (BACTRIM) 400-80 MG tablet Take 2 tablets by mouth 2 (two) times daily. 20 tablet 0   No current facility-administered medications for this visit.   Facility-Administered Medications Ordered in Other Visits   Medication Dose Route Frequency Provider Last Rate Last Admin  . lidocaine (XYLOCAINE) 1 % (with pres) injection             PHYSICAL EXAMINATION: ECOG PERFORMANCE STATUS: 2 - Symptomatic, <50% confined to bed  Vitals:   03/03/20 1239  BP: 128/72  Pulse: 72  Resp: 20  Temp: 98.2 F (36.8 C)  SpO2: 99%   Filed Weights   03/03/20 1239  Weight: 158 lb 1.6 oz (71.7 kg)    GENERAL:alert, no distress and comfortable SKIN: skin color, texture, turgor are normal, (+) diffuse skin erythema in perianal area and the bilateral ingrown, without skin peeling or ulceration.  Mild yellowish discharge in the anal area EYES: normal, Conjunctiva are pink and non-injected, sclera clear NECK: supple, thyroid normal size, non-tender, without nodularity LYMPH:  no palpable lymphadenopathy in the cervical, axillary  LUNGS: clear to auscultation and percussion with normal breathing effort HEART: regular rate & rhythm and no murmurs and no lower extremity edema ABDOMEN:abdomen soft, non-tender and normal bowel sounds Musculoskeletal:no cyanosis of digits and no clubbing  NEURO: alert & oriented x 3 with fluent speech, no focal motor/sensory deficits  LABORATORY DATA:  I have reviewed the data as listed CBC Latest Ref Rng & Units 03/03/2020 02/25/2020 02/21/2020  WBC 4.0 - 10.5 K/uL 3.2(L) 2.4(L) 1.7(L)  Hemoglobin 12.0 - 15.0 g/dL 11.2(L) 11.7(L) 11.1(L)  Hematocrit 36 - 46 % 34.2(L) 36.5 34.7(L)  Platelets 150 - 400 K/uL 126(L) 161 70(L)     CMP Latest Ref Rng & Units 03/03/2020 02/25/2020 02/21/2020  Glucose 70 - 99 mg/dL 119(H) 99 189(H)  BUN 8 - 23 mg/dL 15 14 14   Creatinine 0.44 - 1.00 mg/dL 0.74 0.92 0.99  Sodium 135 - 145 mmol/L 141 140 140  Potassium 3.5 - 5.1 mmol/L 3.6 4.5 3.5  Chloride 98 - 111 mmol/L 107 106 106  CO2 22 - 32 mmol/L 30 28 28   Calcium 8.9 - 10.3 mg/dL 9.3 9.8 9.4  Total Protein 6.5 - 8.1 g/dL 6.2(L) 6.9 6.6  Total Bilirubin 0.3 - 1.2 mg/dL 0.3 0.3 0.3  Alkaline Phos  38 - 126 U/L 43 45 47  AST 15 - 41 U/L 17 23 19   ALT 0 - 44 U/L 25 34 29      RADIOGRAPHIC STUDIES: I have personally reviewed the radiological images as listed and agreed with the findings in the report. IR PICC PLACEMENT RIGHT <5 YRS INC IMG GUIDE  Result Date: 03/03/2020 INDICATION: Patient with history of squamous cell carcinoma of the anus; central venous access requested for chemotherapy. EXAM: RIGHT UPPER EXTREMITY PICC LINE PLACEMENT WITH ULTRASOUND AND FLUOROSCOPIC GUIDANCE MEDICATIONS: 1% lidocaine to skin and subcutaneous tissue ANESTHESIA/SEDATION: None FLUOROSCOPY TIME:  Fluoroscopy Time: 54  seconds (6 mGy).  COMPLICATIONS: None immediate. PROCEDURE: The patient was advised of the possible risks and complications and agreed to undergo the procedure. The patient was then brought to the angiographic suite for the procedure. The right arm was prepped with chlorhexidine, draped in the usual sterile fashion using maximum barrier technique (cap and mask, sterile gown, sterile gloves, large sterile sheet, hand hygiene and cutaneous antisepsis) and infiltrated locally with 1% Lidocaine. Ultrasound demonstrated patency of the right basilic vein, and this was documented with an image. Under real-time ultrasound guidance, this vein was accessed with a 21 gauge micropuncture needle and image documentation was performed. A 0.018 wire was introduced in to the vein. Over this, a 5 [place] single lumen power-injectable PICC was advanced to the lower SVC/right atrial junction. Fluoroscopy during the procedure and fluoro spot radiograph confirms appropriate catheter position. The catheter was flushed and covered with a sterile dressing. Catheter length: 40 cm IMPRESSION: Successful right arm Power PICC line placement with ultrasound and fluoroscopic guidance. The catheter is ready for use. Read by: Rowe Robert, PA-C Electronically Signed   By: Corrie Mckusick D.O.   On: 03/03/2020 11:20     ASSESSMENT & PLAN:   JOSCELYN HARDRICK is a 59 y.o. female with    1.Recurrent squamous cell carcinoma the anus, pT1N0M0 -She initially presented to Dr Theophilus Bones hemorrhoidectomyand Dema Severin 2/5/21withinvasive moderately differentiated squamous cell carcinoma, 1.4 cm,pT1, pNx. -She had local recurrence,underwent surgical excision under the care of Dr. Dema Severin on 01/11/20.Surgical pathwas consistent with squamous cell carcinoma of the anus.01/28/11 staging PETshowed nonode ordistant metastasis. -Idiscussed standard treatment for anal cancer includesconcurrent chemoRTfor 6 weeks with Mitomycin and 5FU on week 1 and 5.she started on8/30/21.  -Status post 4 weeks of concurrent chemoradiation, she is getting more side effects.  She is very frustrated, wants to stop the last week of treatment next week.  I strongly encouraged her to continue. -Lab reviewed, mild neutropenia, adequate for treatment, will proceed week 5 mitomycin and 5-FU today.  Due to her cytopenias, and overall poor tolerance, I will reduce mitomycin dose by 50%, and continue 5-FU full dose. -We will see her back in 3 to 4 days for close f/u   2. Diarrhea, Nausea, Taste Change, secondary to chemoRT -We again reviewed the symptom management -She is able to tolerate oral intake adequately, continue follow-up with dietitian   3.Iron deficiency anemia -Etiology unclear as to her iron deficiency. S/Physterectomy -Currently managed by PCP, Dr. Joylene Draft. She receives iron infusions with Feraheme PRN, last in 12/2019. -Mild, stable.   4. Diabetes Mellitis  -Continue to f/u with PCP for management.   5. Neutropenia, Thrombocytopenia  -secondary to chemo, onset after dose 1 Mitomycin and 5FU.  -resolved    PLAN: -lab reviewed, adequate for treatment, will proceed week 5 chemo with mitomycin dose 50% reduction, and full dose of 5-FU -will see her back this Thursday for close f/u  -continue RT   No problem-specific Assessment & Plan  notes found for this encounter.   No orders of the defined types were placed in this encounter.  All questions were answered. The patient knows to call the clinic with any problems, questions or concerns. No barriers to learning was detected. The total time spent in the appointment was 30 minutes.     Truitt Merle, MD 03/03/2020   I, Joslyn Devon, am acting as scribe for Truitt Merle, MD.   I have reviewed the above documentation for accuracy and completeness, and I agree with the above.

## 2020-03-03 ENCOUNTER — Inpatient Hospital Stay: Payer: BC Managed Care – PPO

## 2020-03-03 ENCOUNTER — Other Ambulatory Visit: Payer: BC Managed Care – PPO

## 2020-03-03 ENCOUNTER — Ambulatory Visit
Admission: RE | Admit: 2020-03-03 | Discharge: 2020-03-03 | Disposition: A | Discharge status: Home / Self Care | Payer: BC Managed Care – PPO | Source: Ambulatory Visit | Attending: Radiation Oncology | Admitting: Radiation Oncology

## 2020-03-03 ENCOUNTER — Ambulatory Visit (HOSPITAL_COMMUNITY)
Admission: RE | Admit: 2020-03-03 | Discharge: 2020-03-03 | Disposition: A | Discharge status: Home / Self Care | Payer: BC Managed Care – PPO | Source: Ambulatory Visit | Attending: Physician Assistant | Admitting: Physician Assistant

## 2020-03-03 ENCOUNTER — Inpatient Hospital Stay (HOSPITAL_BASED_OUTPATIENT_CLINIC_OR_DEPARTMENT_OTHER): Payer: BC Managed Care – PPO | Admitting: Hematology

## 2020-03-03 ENCOUNTER — Other Ambulatory Visit: Payer: Self-pay

## 2020-03-03 ENCOUNTER — Encounter: Payer: Self-pay | Admitting: Hematology

## 2020-03-03 VITALS — BP 106/75 | HR 78 | Temp 98.2°F | Resp 20 | Ht 66.0 in | Wt 158.1 lb

## 2020-03-03 VITALS — BP 118/66 | HR 78 | Resp 18

## 2020-03-03 DIAGNOSIS — Z7984 Long term (current) use of oral hypoglycemic drugs: Secondary | ICD-10-CM | POA: Diagnosis not present

## 2020-03-03 DIAGNOSIS — Z510 Encounter for antineoplastic radiation therapy: Secondary | ICD-10-CM | POA: Diagnosis not present

## 2020-03-03 DIAGNOSIS — D509 Iron deficiency anemia, unspecified: Secondary | ICD-10-CM | POA: Diagnosis not present

## 2020-03-03 DIAGNOSIS — Z5111 Encounter for antineoplastic chemotherapy: Secondary | ICD-10-CM | POA: Diagnosis not present

## 2020-03-03 DIAGNOSIS — D500 Iron deficiency anemia secondary to blood loss (chronic): Secondary | ICD-10-CM

## 2020-03-03 DIAGNOSIS — R197 Diarrhea, unspecified: Secondary | ICD-10-CM | POA: Diagnosis not present

## 2020-03-03 DIAGNOSIS — Z452 Encounter for adjustment and management of vascular access device: Secondary | ICD-10-CM | POA: Diagnosis not present

## 2020-03-03 DIAGNOSIS — C210 Malignant neoplasm of anus, unspecified: Secondary | ICD-10-CM | POA: Diagnosis not present

## 2020-03-03 DIAGNOSIS — E119 Type 2 diabetes mellitus without complications: Secondary | ICD-10-CM | POA: Diagnosis not present

## 2020-03-03 DIAGNOSIS — D709 Neutropenia, unspecified: Secondary | ICD-10-CM | POA: Diagnosis not present

## 2020-03-03 DIAGNOSIS — C218 Malignant neoplasm of overlapping sites of rectum, anus and anal canal: Secondary | ICD-10-CM | POA: Diagnosis not present

## 2020-03-03 LAB — CMP (CANCER CENTER ONLY)
ALT: 25 U/L (ref 0–44)
AST: 17 U/L (ref 15–41)
Albumin: 3.3 g/dL — ABNORMAL LOW (ref 3.5–5.0)
Alkaline Phosphatase: 43 U/L (ref 38–126)
Anion gap: 4 — ABNORMAL LOW (ref 5–15)
BUN: 15 mg/dL (ref 8–23)
CO2: 30 mmol/L (ref 22–32)
Calcium: 9.3 mg/dL (ref 8.9–10.3)
Chloride: 107 mmol/L (ref 98–111)
Creatinine: 0.74 mg/dL (ref 0.44–1.00)
GFR, Est AFR Am: >60 mL/min (ref >60)
GFR, Estimated: >60 mL/min (ref >60)
Glucose, Bld: 119 mg/dL — ABNORMAL HIGH (ref 70–99)
Potassium: 3.6 mmol/L (ref 3.5–5.1)
Sodium: 141 mmol/L (ref 135–145)
Total Bilirubin: 0.3 mg/dL (ref 0.3–1.2)
Total Protein: 6.2 g/dL — ABNORMAL LOW (ref 6.5–8.1)

## 2020-03-03 LAB — CBC WITH DIFFERENTIAL (CANCER CENTER ONLY)
Abs Immature Granulocytes: 0.02 10*3/uL (ref 0.00–0.07)
Basophils Absolute: 0 10*3/uL (ref 0.0–0.1)
Basophils Relative: 1 %
Eosinophils Absolute: 0.2 10*3/uL (ref 0.0–0.5)
Eosinophils Relative: 8 %
HCT: 34.2 % — ABNORMAL LOW (ref 36.0–46.0)
Hemoglobin: 11.2 g/dL — ABNORMAL LOW (ref 12.0–15.0)
Immature Granulocytes: 1 %
Lymphocytes Relative: 10 %
Lymphs Abs: 0.3 10*3/uL — ABNORMAL LOW (ref 0.7–4.0)
MCH: 26.5 pg (ref 26.0–34.0)
MCHC: 32.7 g/dL (ref 30.0–36.0)
MCV: 80.9 fL (ref 80.0–100.0)
Monocytes Absolute: 0.4 10*3/uL (ref 0.1–1.0)
Monocytes Relative: 12 %
Neutro Abs: 2.2 10*3/uL (ref 1.7–7.7)
Neutrophils Relative %: 68 %
Platelet Count: 126 10*3/uL — ABNORMAL LOW (ref 150–400)
RBC: 4.23 MIL/uL (ref 3.87–5.11)
RDW: 18 % — ABNORMAL HIGH (ref 11.5–15.5)
WBC Count: 3.2 10*3/uL — ABNORMAL LOW (ref 4.0–10.5)
nRBC: 0 % (ref 0.0–0.2)

## 2020-03-03 MED ORDER — SODIUM CHLORIDE 0.9% FLUSH
10.0000 mL | Freq: Once | INTRAVENOUS | Status: AC | PRN
  Administered 2020-03-03: 10 mL
  Filled 2020-03-03: qty 10

## 2020-03-03 MED ORDER — PROCHLORPERAZINE MALEATE 10 MG PO TABS
10.0000 mg | ORAL_TABLET | Freq: Once | ORAL | Status: AC
  Administered 2020-03-03: 10 mg via ORAL

## 2020-03-03 MED ORDER — MITOMYCIN CHEMO IV INJECTION 20 MG
5.0000 mg/m2 | Freq: Once | INTRAVENOUS | Status: AC
  Administered 2020-03-03: 9.5 mg via INTRAVENOUS
  Filled 2020-03-03: qty 19

## 2020-03-03 MED ORDER — HEPARIN SOD (PORK) LOCK FLUSH 100 UNIT/ML IV SOLN
250.0000 [IU] | Freq: Once | INTRAVENOUS | Status: AC | PRN
  Administered 2020-03-03: 250 [IU]
  Filled 2020-03-03: qty 5

## 2020-03-03 MED ORDER — SODIUM CHLORIDE 0.9 % IV SOLN
Freq: Once | INTRAVENOUS | Status: AC
  Filled 2020-03-03: qty 250

## 2020-03-03 MED ORDER — SODIUM CHLORIDE 0.9 % IV SOLN
1000.0000 mg/m2/d | INTRAVENOUS | Status: DC
  Administered 2020-03-03: 7450 mg via INTRAVENOUS
  Filled 2020-03-03: qty 149

## 2020-03-03 MED ORDER — LIDOCAINE HCL 1 % IJ SOLN
INTRAMUSCULAR | Status: AC
  Filled 2020-03-03: qty 20

## 2020-03-03 MED ORDER — PROCHLORPERAZINE MALEATE 10 MG PO TABS
ORAL_TABLET | ORAL | Status: AC
  Filled 2020-03-03: qty 1

## 2020-03-03 NOTE — Procedures (Signed)
Right single lumen basilic vein PICC placed.  Length 40 cm.  Tip SVC/right atrial junction.  No immediate complications.  Okay to use. EBL< 2 cc.

## 2020-03-03 NOTE — Patient Instructions (Signed)
[facility] Discharge Instructions for Patients Receiving Chemotherapy  Today you received the following chemotherapy agents Mitomycin and Fluorouracil  To help prevent nausea and vomiting after your treatment, we encourage you to take your nausea medication as directed.  If you develop nausea and vomiting that is not controlled by your nausea medication, call the clinic.   BELOW ARE SYMPTOMS THAT SHOULD BE REPORTED IMMEDIATELY:  *FEVER GREATER THAN 100.5 F  *CHILLS WITH OR WITHOUT FEVER  NAUSEA AND VOMITING THAT IS NOT CONTROLLED WITH YOUR NAUSEA MEDICATION  *UNUSUAL SHORTNESS OF BREATH  *UNUSUAL BRUISING OR BLEEDING  TENDERNESS IN MOUTH AND THROAT WITH OR WITHOUT PRESENCE OF ULCERS  *URINARY PROBLEMS  *BOWEL PROBLEMS  UNUSUAL RASH Items with * indicate a potential emergency and should be followed up as soon as possible.  Feel free to call the clinic should you have any questions or concerns. The clinic phone number is [phone].  Please show the [place] at check-in to the Emergency Department and triage nurse.

## 2020-03-04 ENCOUNTER — Ambulatory Visit
Admission: RE | Admit: 2020-03-04 | Discharge: 2020-03-04 | Disposition: A | Discharge status: Home / Self Care | Payer: BC Managed Care – PPO | Source: Ambulatory Visit | Attending: Radiation Oncology | Admitting: Radiation Oncology

## 2020-03-04 ENCOUNTER — Telehealth: Payer: Self-pay | Admitting: Hematology

## 2020-03-04 DIAGNOSIS — C210 Malignant neoplasm of anus, unspecified: Secondary | ICD-10-CM | POA: Diagnosis not present

## 2020-03-04 DIAGNOSIS — Z510 Encounter for antineoplastic radiation therapy: Secondary | ICD-10-CM | POA: Diagnosis not present

## 2020-03-04 NOTE — Telephone Encounter (Signed)
Scheduled per 9/27 los. Pt is aware of appt time and date. 

## 2020-03-05 ENCOUNTER — Ambulatory Visit
Admission: RE | Admit: 2020-03-05 | Discharge: 2020-03-05 | Disposition: A | Discharge status: Home / Self Care | Payer: BC Managed Care – PPO | Source: Ambulatory Visit | Attending: Radiation Oncology | Admitting: Radiation Oncology

## 2020-03-05 DIAGNOSIS — C210 Malignant neoplasm of anus, unspecified: Secondary | ICD-10-CM | POA: Diagnosis not present

## 2020-03-05 DIAGNOSIS — Z510 Encounter for antineoplastic radiation therapy: Secondary | ICD-10-CM | POA: Diagnosis not present

## 2020-03-05 NOTE — Progress Notes (Signed)
[place]   [phone] [phone]   Clinic Follow up Note   Patient Care Team: Crist Infante, MD as PCP - General (Internal Medicine) Fay Records, MD as PCP - Cardiology (Cardiology) Nobie Putnam, MD (Hematology and Oncology) Heilingoetter, Tobe Sos, PA-C as Physician Assistant (Oncology) Truitt Merle, MD as Consulting Physician (Oncology) Jonnie Finner, RN as Oncology Nurse Navigator  Date of Service:  03/06/2020  CHIEF COMPLAINT:  F/u ofSquamous cell carcinoma of the anus  SUMMARY OF ONCOLOGIC HISTORY: Oncology History Overview Note  Cancer Staging No matching staging information was found for the patient.    Squamous cell cancer, anus ([place])  07/13/2019 Procedure    EUA, Excision of posterior internal/external hemorrhoid under the care of Dr. Hassell Done    07/13/2019 Pathology Results   - Invasive moderately differentiated squamous cell carcinoma, 1.4 cm.  See comment  - Carcinoma invades for depth of 0.4 cm  - Deep resection margin is negative for carcinoma (0.2 cm)  - Lateral mucosal margin is positive for high-grade dysplasia  - No evidence of lymphovascular perineural invasion  Procedure: Local excision  Tumor Site: Anal canal  Tumor Size: 1.4 cm  Histologic Type: Invasive squamous cell carcinoma  Histologic Grade: G2: Moderately differentiated  Tumor Extension: Carcinoma invades superficial anal sphincter muscle  Margins: Uninvolved by tumor  Treatment Effect: N/A  Regional Lymph Nodes: No lymph nodes submitted or found  Pathologic Stage Classification (pTNM, AJCC 8th Edition):  pT1, pNx  Representative Tumor Block: A1  Comment(s): Lateral mucosal margin is positive for high-grade squamous  dysplasia      07/13/2019 Initial Diagnosis   Squamous cell cancer, anus ([place])   09/04/2019 Imaging   CT scan Chest, Abdomen, and Pelvis   IMPRESSION: 1. No evidence of metastatic disease in the chest, abdomen or pelvis. 2. No  discrete anorectal mass. 3. Chronic findings include: Punctate nonobstructing upper right renal stones and chronic right renal scarring. 4. Aortic Atherosclerosis (ICD10-I70.0).   01/10/2020 Pathology Results   A. ANAL LESION, POSTERIOR MIDLINE, EXCISION:  - Squamous cell carcinoma, moderately differentiated.  Verbally reported by Dr. Saralyn Pilar 1.5 cm, negative margins, depth <1 mm   01/11/2020 Procedure   1. Excision of anal canal lesion (posterior midline) under the care of Dr. Dema Severin     01/28/2020 PET scan   IMPRESSION: 1. Mild focal anal hypermetabolism without discrete mass correlate on the CT images, nonspecific, differential includes postsurgical change or residual anal tumor. 2. No hypermetabolic locoregional or distant metastatic disease. 3. Chronic findings include: Aortic Atherosclerosis (ICD10-I70.0). Diffuse hepatic steatosis. Nonobstructing right nephrolithiasis.     02/04/2020 - 03/13/2020 Radiation Therapy   concurrent chemoRT by Dr Lisbeth Renshaw with Mitomycin and 5FU starting 02/04/20-03/13/20   02/04/2020 - 03/03/2020 Chemotherapy   Concurrent chemoRT with Mitomycin and 5FU on week 1 and 5 starting 02/04/20-03/03/20      CURRENT THERAPY:  Concurrent chemoRT withMitomycin and 5FU on week 1 and 5starting 02/04/20-03/13/20  INTERVAL HISTORY:  Melinda [name] is here for a follow up. She presents to the clinic with her husband. I saw her earlier this week. She has been doing daily radiation, tolerating chemotherapy moderately well, and muscle or diarrhea her main concern is her skin irritation in the perianal area and groin area she has noticed some blisters, she has been using creams radiation oncology. Pain is worse, she is not taking any pain medicine she cannot sit very comfortably, and wakes up at night too. Weight is stable.  All other systems were reviewed with the patient and are negative.  MEDICAL HISTORY:  Past Medical History:  Diagnosis Date   Diabetes mellitus  type 2, controlled ([place])    Dyslipidemia    untreated   GERD (gastroesophageal reflux disease)    Hemorrhoids    Microcytic anemia 06/22/2012   Nephrolith    Palpitation    positive tilt table test. Prozac and Inderal treatment effective.    SURGICAL HISTORY: Past Surgical History:  Procedure Laterality Date   APPENDECTOMY     BREAST REDUCTION SURGERY     CARDIAC CATHETERIZATION  2002   normal LV function and no significant coronary obstruction   CATARACT EXTRACTION, BILATERAL Bilateral november 2020 and dcember 2020   CYST EXCISION N/A 01/11/2020   Procedure: EXCISION OF ANAL CANAL MASS;  Surgeon: Ileana Roup, MD;  Location: [facility];  Service: General;  Laterality: N/A;   ESOPHAGEAL MANOMETRY N/A 04/04/2017   Procedure: ESOPHAGEAL MANOMETRY (EM);  Surgeon: Laurence Spates, MD;  Location: [facility];  Service: Endoscopy;  Laterality: N/A;   HEMORRHOID SURGERY N/A 07/13/2019   Procedure: HEMORRHOIDECTOMY;  Surgeon: Johnathan Hausen, MD;  Location: [facility];  Service: General;  Laterality: N/A;   KIDNEY SURGERY     kidney stones    PARTIAL HYSTERECTOMY     RECTAL EXAM UNDER ANESTHESIA N/A 01/11/2020   Procedure: RECTAL EXAM UNDER ANESTHESIA;  Surgeon: Ileana Roup, MD;  Location: [facility];  Service: General;  Laterality: N/A;   TONSILLECTOMY      I have reviewed the social history and family history with the patient and they are unchanged from previous note.  ALLERGIES:  is allergic to cephalexin, amoxapine and related, amoxicillin-pot clavulanate, atorvastatin, cefaclor, ciprofloxacin, codeine, doxycycline calcium, fenofibrate micronized, fish oil, flagyl [metronidazole hcl], levofloxacin in d5w, metronidazole, ofloxacin, prednisolone, promethazine hcl, tetracycline hcl, erythromycin, and nitrofurantoin.  MEDICATIONS:  Current Outpatient Medications  Medication Sig Dispense Refill   clorazepate (TRANXENE) 7.5 MG tablet Take 7.5 mg by  mouth 2 (two) times daily.  1   CRESTOR 5 MG tablet Take 5 mg by mouth every evening.   1   esomeprazole (NEXIUM) 40 MG capsule Take 40 mg by mouth daily at 12 noon.     FLUoxetine (PROZAC) 20 MG capsule Take 20 mg by mouth every morning.     hyoscyamine (ANASPAZ) 0.125 MG TBDP disintergrating tablet Place 1 tablet (0.125 mg total) under the tongue every 6 (six) hours as needed. 30 tablet 2   Loperamide HCl (IMODIUM PO) Take 1 capsule by mouth every 6 (six) hours as needed.     metFORMIN (GLUCOPHAGE) 500 MG tablet Take by mouth 2 (two) times daily with a meal.     methenamine (HIPREX) 1 g tablet Take 0.5 g by mouth daily.      NU-IRON 150 MG capsule Take 150 mg by mouth daily.   2   prochlorperazine (COMPAZINE) 10 MG tablet Take 1 tablet (10 mg total) by mouth every 6 (six) hours as needed (Nausea or vomiting). 30 tablet 1   propranolol (INDERAL) 10 MG tablet TAKE 1 TABLET BY MOUTH TWICE A DAY (Patient taking differently: Take 10 mg by mouth 2 (two) times daily. ) 180 tablet 2   silver sulfADIAZINE (SILVADENE) 1 % cream Apply 1 application topically daily. 400 g 0   sulfamethoxazole-trimethoprim (BACTRIM) 400-80 MG tablet Take 2 tablets by mouth 2 (two) times daily. 20 tablet 0   diphenoxylate-atropine (LOMOTIL) 2.5-0.025 MG tablet Take 1-2 tablets  by mouth 4 (four) times daily as needed for diarrhea or loose stools. (Patient not taking: Reported on 02/25/2020) 60 tablet 1   ibuprofen (ADVIL) 800 MG tablet Take 1 tablet (800 mg total) by mouth every 8 (eight) hours as needed. (Patient not taking: Reported on 01/10/2020) 30 tablet 0   ondansetron (ZOFRAN) 8 MG tablet Take 1 tablet (8 mg total) by mouth 2 (two) times daily as needed (Nausea or vomiting). 30 tablet 1   polyvinyl alcohol (LIQUIFILM TEARS) 1.4 % ophthalmic solution Place 1 drop into both eyes as needed for dry eyes.     No current facility-administered medications for this visit.    PHYSICAL EXAMINATION: ECOG  PERFORMANCE STATUS: 2 - Symptomatic, <50% confined to bed  Vitals:   03/06/20 1444  BP: 125/77  Pulse: 90  Resp: 18  Temp: 98.6 F (37 C)  SpO2: 98%   Filed Weights   03/06/20 1444  Weight: 156 lb 12.8 oz (71.1 kg)    GENERAL:alert, no distress and comfortable Musculoskeletal:no cyanosis of digits and no clubbing  NEURO: alert & oriented x 3 with fluent speech, no focal motor/sensory deficits   LABORATORY DATA:  I have reviewed the data as listed CBC Latest Ref Rng & Units 03/03/2020 02/25/2020 02/21/2020  WBC 4.0 - 10.5 K/uL 3.2(L) 2.4(L) 1.7(L)  Hemoglobin 12.0 - 15.0 g/dL 11.2(L) 11.7(L) 11.1(L)  Hematocrit 36 - 46 % 34.2(L) 36.5 34.7(L)  Platelets 150 - 400 K/uL 126(L) 161 70(L)     CMP Latest Ref Rng & Units 03/03/2020 02/25/2020 02/21/2020  Glucose 70 - 99 mg/dL 119(H) 99 189(H)  BUN 8 - 23 mg/dL 15 14 14   Creatinine 0.44 - 1.00 mg/dL 0.74 0.92 0.99  Sodium 135 - 145 mmol/L 141 140 140  Potassium 3.5 - 5.1 mmol/L 3.6 4.5 3.5  Chloride 98 - 111 mmol/L 107 106 106  CO2 22 - 32 mmol/L 30 28 28   Calcium 8.9 - 10.3 mg/dL 9.3 9.8 9.4  Total Protein 6.5 - 8.1 g/dL 6.2(L) 6.9 6.6  Total Bilirubin 0.3 - 1.2 mg/dL 0.3 0.3 0.3  Alkaline Phos 38 - 126 U/L 43 45 47  AST 15 - 41 U/L 17 23 19   ALT 0 - 44 U/L 25 34 29      RADIOGRAPHIC STUDIES: I have personally reviewed the radiological images as listed and agreed with the findings in the report. No results found.   ASSESSMENT & PLAN:  Melinda [name] is a 59 y.o. female with    1.Recurrent squamous cell carcinoma the anus, pT1N0M0 -She initially presented to Dr Theophilus Bones hemorrhoidectomyand Dema Severin 2/5/21withinvasive moderately differentiated squamous cell carcinoma, 1.4 cm,pT1, pNx. -She had local recurrence,underwent surgical excision under the care of Dr. Dema Severin on 01/11/20.Surgical pathwas consistent with squamous cell carcinoma of the anus.01/28/11 staging PETshowed nonode ordistant  metastasis. -Idiscussed standard treatment for anal cancer includesconcurrent chemoRTfor 6 weekswith Mitomycin and5FU on week 1 and 5.she started on8/30/21. Plan to complete RT 03/13/20.  -she is on week 5 chemo and radiation, has moderate side effects. she began wants to quit last few treatments next week, I strongly encouraged her to complete the treatment course. -f/u next Monday   2. Diarrhea, Nausea, Taste Change, and radiation dermatitis and pain -She is able to tolerate oral intake adequately, continue follow-up with dietitian -we again reviewed skin care  -I encouraged her to use tramadol for pain control  3.Iron deficiency anemia -Etiology unclear as to her iron deficiency. S/Physterectomy -Currently managed by PCP, Dr. Joylene Draft.  She receives iron infusions with Feraheme PRN, last in 12/2019. -Mild, stable.  4. Diabetes Mellitis  -Continue to f/u with PCP for management.  5. Neutropenia, Thrombocytopenia -secondary to chemo, onset after dose 1 Mitomycin and 5FU.  -resolved    PLAN: -she is doing moderate I encouraged her to continue treatment -She will finish chemotherapy tomorrow plan to remove PICC line afterwards -I recommend her to start using tramadol for pain  -Follow-up on next Monday with lab   No problem-specific Assessment & Plan notes found for this encounter.   No orders of the defined types were placed in this encounter.  All questions were answered. The patient knows to call the clinic with any problems, questions or concerns. No barriers to learning was detected. The total time spent in the appointment was 20 minutes.     Truitt Merle, MD 03/06/2020   I, Joslyn Devon, am acting as scribe for Truitt Merle, MD.   I have reviewed the above documentation for accuracy and completeness, and I agree with the above.

## 2020-03-06 ENCOUNTER — Inpatient Hospital Stay (HOSPITAL_BASED_OUTPATIENT_CLINIC_OR_DEPARTMENT_OTHER): Payer: BC Managed Care – PPO | Admitting: Hematology

## 2020-03-06 ENCOUNTER — Ambulatory Visit
Admission: RE | Admit: 2020-03-06 | Discharge: 2020-03-06 | Disposition: A | Discharge status: Home / Self Care | Payer: BC Managed Care – PPO | Source: Ambulatory Visit | Attending: Radiation Oncology | Admitting: Radiation Oncology

## 2020-03-06 ENCOUNTER — Other Ambulatory Visit: Payer: Self-pay

## 2020-03-06 ENCOUNTER — Encounter: Payer: Self-pay | Admitting: Hematology

## 2020-03-06 VITALS — BP 125/77 | HR 90 | Temp 98.6°F | Resp 18 | Ht 66.0 in | Wt 156.8 lb

## 2020-03-06 DIAGNOSIS — C210 Malignant neoplasm of anus, unspecified: Secondary | ICD-10-CM | POA: Diagnosis not present

## 2020-03-06 DIAGNOSIS — D709 Neutropenia, unspecified: Secondary | ICD-10-CM | POA: Diagnosis not present

## 2020-03-06 DIAGNOSIS — Z5111 Encounter for antineoplastic chemotherapy: Secondary | ICD-10-CM | POA: Diagnosis not present

## 2020-03-06 DIAGNOSIS — D500 Iron deficiency anemia secondary to blood loss (chronic): Secondary | ICD-10-CM

## 2020-03-06 DIAGNOSIS — C218 Malignant neoplasm of overlapping sites of rectum, anus and anal canal: Secondary | ICD-10-CM | POA: Diagnosis not present

## 2020-03-06 DIAGNOSIS — R197 Diarrhea, unspecified: Secondary | ICD-10-CM | POA: Diagnosis not present

## 2020-03-06 DIAGNOSIS — D509 Iron deficiency anemia, unspecified: Secondary | ICD-10-CM | POA: Diagnosis not present

## 2020-03-06 DIAGNOSIS — E119 Type 2 diabetes mellitus without complications: Secondary | ICD-10-CM | POA: Diagnosis not present

## 2020-03-06 DIAGNOSIS — Z7984 Long term (current) use of oral hypoglycemic drugs: Secondary | ICD-10-CM | POA: Diagnosis not present

## 2020-03-06 DIAGNOSIS — Z510 Encounter for antineoplastic radiation therapy: Secondary | ICD-10-CM | POA: Diagnosis not present

## 2020-03-07 ENCOUNTER — Inpatient Hospital Stay: Payer: BC Managed Care – PPO | Attending: Physician Assistant

## 2020-03-07 ENCOUNTER — Ambulatory Visit
Admission: RE | Admit: 2020-03-07 | Discharge: 2020-03-07 | Disposition: A | Discharge status: Home / Self Care | Payer: BC Managed Care – PPO | Source: Ambulatory Visit | Attending: Radiation Oncology | Admitting: Radiation Oncology

## 2020-03-07 ENCOUNTER — Encounter: Payer: Self-pay | Admitting: Radiation Oncology

## 2020-03-07 ENCOUNTER — Other Ambulatory Visit: Payer: Self-pay

## 2020-03-07 VITALS — BP 109/76 | HR 85 | Temp 98.8°F | Resp 13

## 2020-03-07 DIAGNOSIS — Z452 Encounter for adjustment and management of vascular access device: Secondary | ICD-10-CM | POA: Diagnosis not present

## 2020-03-07 DIAGNOSIS — C210 Malignant neoplasm of anus, unspecified: Secondary | ICD-10-CM | POA: Insufficient documentation

## 2020-03-07 DIAGNOSIS — Z510 Encounter for antineoplastic radiation therapy: Secondary | ICD-10-CM | POA: Diagnosis not present

## 2020-03-07 MED ORDER — SODIUM CHLORIDE 0.9% FLUSH
10.0000 mL | INTRAVENOUS | Status: DC | PRN
  Administered 2020-03-07: 10 mL
  Filled 2020-03-07: qty 10

## 2020-03-07 NOTE — Progress Notes (Signed)
[place]   [phone] [phone]   Clinic Follow up Note   Patient Care Team: Crist Infante, MD as PCP - General (Internal Medicine) Fay Records, MD as PCP - Cardiology (Cardiology) Nobie Putnam, MD (Hematology and Oncology) Heilingoetter, Tobe Sos, PA-C as Physician Assistant (Oncology) Truitt Merle, MD as Consulting Physician (Oncology) Jonnie Finner, RN as Oncology Nurse Navigator  Date of Service:  03/10/2020  CHIEF COMPLAINT: F/u ofSquamous cell carcinoma of the anus  SUMMARY OF ONCOLOGIC HISTORY: Oncology History Overview Note  Cancer Staging No matching staging information was found for the patient.    Squamous cell cancer, anus ([place])  07/13/2019 Procedure    EUA, Excision of posterior internal/external hemorrhoid under the care of Dr. Hassell Done    07/13/2019 Pathology Results   - Invasive moderately differentiated squamous cell carcinoma, 1.4 cm.  See comment  - Carcinoma invades for depth of 0.4 cm  - Deep resection margin is negative for carcinoma (0.2 cm)  - Lateral mucosal margin is positive for high-grade dysplasia  - No evidence of lymphovascular perineural invasion  Procedure: Local excision  Tumor Site: Anal canal  Tumor Size: 1.4 cm  Histologic Type: Invasive squamous cell carcinoma  Histologic Grade: G2: Moderately differentiated  Tumor Extension: Carcinoma invades superficial anal sphincter muscle  Margins: Uninvolved by tumor  Treatment Effect: N/A  Regional Lymph Nodes: No lymph nodes submitted or found  Pathologic Stage Classification (pTNM, AJCC 8th Edition):  pT1, pNx  Representative Tumor Block: A1  Comment(s): Lateral mucosal margin is positive for high-grade squamous  dysplasia      07/13/2019 Initial Diagnosis   Squamous cell cancer, anus ([place])   09/04/2019 Imaging   CT scan Chest, Abdomen, and Pelvis   IMPRESSION: 1. No evidence of metastatic disease in the chest, abdomen or pelvis. 2. No  discrete anorectal mass. 3. Chronic findings include: Punctate nonobstructing upper right renal stones and chronic right renal scarring. 4. Aortic Atherosclerosis (ICD10-I70.0).   01/10/2020 Pathology Results   A. ANAL LESION, POSTERIOR MIDLINE, EXCISION:  - Squamous cell carcinoma, moderately differentiated.  Verbally reported by Dr. Saralyn Pilar 1.5 cm, negative margins, depth <1 mm   01/11/2020 Procedure   1. Excision of anal canal lesion (posterior midline) under the care of Dr. Dema Severin     01/28/2020 PET scan   IMPRESSION: 1. Mild focal anal hypermetabolism without discrete mass correlate on the CT images, nonspecific, differential includes postsurgical change or residual anal tumor. 2. No hypermetabolic locoregional or distant metastatic disease. 3. Chronic findings include: Aortic Atherosclerosis (ICD10-I70.0). Diffuse hepatic steatosis. Nonobstructing right nephrolithiasis.     02/04/2020 - 03/13/2020 Radiation Therapy   concurrent chemoRT by Dr Lisbeth Renshaw with Mitomycin and 5FU starting 02/04/20-03/13/20   02/04/2020 - 03/03/2020 Chemotherapy   Concurrent chemoRT with Mitomycin and 5FU on week 1 and 5 starting 02/04/20-03/03/20      CURRENT THERAPY:  Concurrent chemoRT withMitomycin and 5FU on week 1 and 5starting 02/04/20-03/13/20  INTERVAL HISTORY:  Melinda [name] is here for a follow up. She presents to the clinic with husband. She notes she has had nausea and fever last night from 99.8-100.23F. She notes she feels horrible this morning and started vomiting. Over the weekend she had a few bites of solid foods for 2 meals. She had ginger ale and water yesterday. Overall not a lot of food and liquid intake. She notes she has significant dysuria and pain with BM due to internal and external irritation from RT. She notes  her BM can be loose but she still feels urge to strain. For pain she is taking Tramadol without much help and made her dizzy. She notes she has an allergy reaction with rash  from Codeine.     REVIEW OF SYSTEMS:   Constitutional: Denies fevers, chills or abnormal weight loss Eyes: Denies blurriness of vision Ears, nose, mouth, throat, and face: Denies mucositis or sore throat Respiratory: Denies cough, dyspnea or wheezes Cardiovascular: Denies palpitation, chest discomfort or lower extremity swelling UA: (+) Dysuria  Gastrointestinal: Denies heartburn or change in bowel habits (+) Nausea and vomiting (+) Pain with BM (+) Skin irration af rectum pelvis and groin.  Skin: Denies abnormal skin rashes Lymphatics: Denies new lymphadenopathy or easy bruising Neurological:Denies numbness, tingling or new weaknesses Behavioral/Psych: Mood is stable, no new changes  All other systems were reviewed with the patient and are negative.  MEDICAL HISTORY:  Past Medical History:  Diagnosis Date  . Diabetes mellitus type 2, controlled ([place])   . Dyslipidemia    untreated  . GERD (gastroesophageal reflux disease)   . Hemorrhoids   . Microcytic anemia 06/22/2012  . Nephrolith   . Palpitation    positive tilt table test. Prozac and Inderal treatment effective.    SURGICAL HISTORY: Past Surgical History:  Procedure Laterality Date  . APPENDECTOMY    . BREAST REDUCTION SURGERY    . CARDIAC CATHETERIZATION  2002   normal LV function and no significant coronary obstruction  . CATARACT EXTRACTION, BILATERAL Bilateral november 2020 and dcember 2020  . CYST EXCISION N/A 01/11/2020   Procedure: EXCISION OF ANAL CANAL MASS;  Surgeon: Ileana Roup, MD;  Location: [facility];  Service: General;  Laterality: N/A;  . ESOPHAGEAL MANOMETRY N/A 04/04/2017   Procedure: ESOPHAGEAL MANOMETRY (EM);  Surgeon: Laurence Spates, MD;  Location: [facility];  Service: Endoscopy;  Laterality: N/A;  . HEMORRHOID SURGERY N/A 07/13/2019   Procedure: HEMORRHOIDECTOMY;  Surgeon: Johnathan Hausen, MD;  Location: [facility];  Service: General;  Laterality: N/A;  . KIDNEY SURGERY      kidney stones   . PARTIAL HYSTERECTOMY    . RECTAL EXAM UNDER ANESTHESIA N/A 01/11/2020   Procedure: RECTAL EXAM UNDER ANESTHESIA;  Surgeon: Ileana Roup, MD;  Location: [facility];  Service: General;  Laterality: N/A;  . TONSILLECTOMY      I have reviewed the social history and family history with the patient and they are unchanged from previous note.  ALLERGIES:  is allergic to cephalexin, amoxapine and related, amoxicillin-pot clavulanate, atorvastatin, cefaclor, ciprofloxacin, codeine, doxycycline calcium, fenofibrate micronized, fish oil, flagyl [metronidazole hcl], levofloxacin in d5w, metronidazole, ofloxacin, prednisolone, promethazine hcl, tetracycline hcl, erythromycin, and nitrofurantoin.  MEDICATIONS:  Current Outpatient Medications  Medication Sig Dispense Refill  . clorazepate (TRANXENE) 7.5 MG tablet Take 7.5 mg by mouth 2 (two) times daily.  1  . CRESTOR 5 MG tablet Take 5 mg by mouth every evening.   1  . diphenoxylate-atropine (LOMOTIL) 2.5-0.025 MG tablet Take 1-2 tablets by mouth 4 (four) times daily as needed for diarrhea or loose stools. (Patient not taking: Reported on 02/25/2020) 60 tablet 1  . esomeprazole (NEXIUM) 40 MG capsule Take 40 mg by mouth daily at 12 noon.    [name] FLUoxetine (PROZAC) 20 MG capsule Take 20 mg by mouth every morning.    . hyoscyamine (ANASPAZ) 0.125 MG TBDP disintergrating tablet Place 1 tablet (0.125 mg total) under the tongue every 6 (six) hours as needed. 30 tablet 2  .  ibuprofen (ADVIL) 800 MG tablet Take 1 tablet (800 mg total) by mouth every 8 (eight) hours as needed. (Patient not taking: Reported on 01/10/2020) 30 tablet 0  . Loperamide HCl (IMODIUM PO) Take 1 capsule by mouth every 6 (six) hours as needed.    . metFORMIN (GLUCOPHAGE) 500 MG tablet Take by mouth 2 (two) times daily with a meal.    . methenamine (HIPREX) 1 g tablet Take 0.5 g by mouth daily.     [name] NU-IRON 150 MG capsule Take 150 mg by mouth daily.   2  . ondansetron  (ZOFRAN) 8 MG tablet Take 1 tablet (8 mg total) by mouth 2 (two) times daily as needed (Nausea or vomiting). 30 tablet 1  . polyvinyl alcohol (LIQUIFILM TEARS) 1.4 % ophthalmic solution Place 1 drop into both eyes as needed for dry eyes.    [name] prochlorperazine (COMPAZINE) 10 MG tablet Take 1 tablet (10 mg total) by mouth every 6 (six) hours as needed (Nausea or vomiting). 30 tablet 1  . propranolol (INDERAL) 10 MG tablet TAKE 1 TABLET BY MOUTH TWICE A DAY (Patient taking differently: Take 10 mg by mouth 2 (two) times daily. ) 180 tablet 2  . silver sulfADIAZINE (SILVADENE) 1 % cream Apply 1 application topically daily. 400 g 0  . sulfamethoxazole-trimethoprim (BACTRIM) 400-80 MG tablet Take 2 tablets by mouth 2 (two) times daily. 20 tablet 0   Current Facility-Administered Medications  Medication Dose Route Frequency Provider Last Rate Last Admin  . oxyCODONE (Oxy IR/ROXICODONE) immediate release tablet 5 mg  5 mg Oral Once Truitt Merle, MD        PHYSICAL EXAMINATION: ECOG PERFORMANCE STATUS: 3 - Symptomatic, >50% confined to bed  Vitals:   03/10/20 1319  BP: 118/75  Pulse: 92  Resp: 18  Temp: 98 F (36.7 C)  SpO2: 97%   Filed Weights    GENERAL:alert, no distress and comfortable SKIN: skin color, texture, turgor are normal, no rashes or significant lesions EYES: normal, Conjunctiva are pink and non-injected, sclera clear  NECK: supple, thyroid normal size, non-tender, without nodularity LYMPH:  no palpable lymphadenopathy in the cervical, axillary  LUNGS: clear to auscultation and percussion with normal breathing effort HEART: regular rate & rhythm and no murmurs and no lower extremity edema ABDOMEN:abdomen soft, non-tender and normal bowel sounds Musculoskeletal:no cyanosis of digits and no clubbing  NEURO: alert & oriented x 3 with fluent speech, no focal motor/sensory deficits RECTAL: (+) mild skin ulcers and breakdown around groin (+) external hemorrhoids, skin erythema with  mild discharge around rectum   LABORATORY DATA:  I have reviewed the data as listed CBC Latest Ref Rng & Units 03/10/2020 03/03/2020 02/25/2020  WBC 4.0 - 10.5 K/uL 2.2(L) 3.2(L) 2.4(L)  Hemoglobin 12.0 - 15.0 g/dL 11.8(L) 11.2(L) 11.7(L)  Hematocrit 36 - 46 % 36.2 34.2(L) 36.5  Platelets 150 - 400 K/uL 107(L) 126(L) 161     CMP Latest Ref Rng & Units 03/10/2020 03/03/2020 02/25/2020  Glucose 70 - 99 mg/dL 157(H) 119(H) 99  BUN 8 - 23 mg/dL 17 15 14   Creatinine 0.44 - 1.00 mg/dL 0.85 0.74 0.92  Sodium 135 - 145 mmol/L 138 141 140  Potassium 3.5 - 5.1 mmol/L 3.4(L) 3.6 4.5  Chloride 98 - 111 mmol/L 103 107 106  CO2 22 - 32 mmol/L 27 30 28   Calcium 8.9 - 10.3 mg/dL 9.6 9.3 9.8  Total Protein 6.5 - 8.1 g/dL 6.9 6.2(L) 6.9  Total Bilirubin 0.3 - 1.2 mg/dL 0.4  0.3 0.3  Alkaline Phos 38 - 126 U/L 53 43 45  AST 15 - 41 U/L 10(L) 17 23  ALT 0 - 44 U/L 12 25 34      RADIOGRAPHIC STUDIES: I have personally reviewed the radiological images as listed and agreed with the findings in the report. No results found.   ASSESSMENT & PLAN:  Melinda [name] is a 59 y.o. female with    1.Recurrent squamous cell carcinoma the anus, pT1N0M0 -She initially presented to Dr Theophilus Bones hemorrhoidectomyand Dema Severin 2/5/21withinvasive moderately differentiated squamous cell carcinoma, 1.4 cm,pT1, pNx. -She had local recurrence,underwent surgical excision under the care of Dr. Dema Severin on 01/11/20.Surgical pathwas consistent with squamous cell carcinoma of the anus.01/28/11 staging PETshowed nonode ordistant metastasis. -Idiscussed standard treatment for anal cancer includesconcurrent chemoRTfor 6 weekswith Mitomycin and5FU on week 1 and 5.shestarted on8/30/21.Plan to complete RT 03/13/20. -S/p week 5 treatment she tolerated poorly with worsening abdominal and rectal pain, dysuria, nausea the last few days and vomiting today, along with further skin and local irritation of groin, rectum and  urethra leading to severe pain. She did have fever last night at 100.74F.  -Will give pain medication today and IV Fluids today. I recommend urine and blood culture to evaluate for possible infection. Labs reviewed, WBC 2.2, Hg 11.8, plt 107K. ANC 1.7K -She will consult with Rad Onc about postponing or stopping the rest of radiation given poor toleration. I called NP Bryson Ha and informed her that she will skip radiation today, and maybe the rest of radiation also. She will call her to discuss with her  -continue supportive care    2. Diarrhea, Nausea, and radiation dermatitis and pain -She is able to tolerate oral intake adequately, continue follow-up with dietitian -S/p week 5 treatment her skin irration worsened with severe dysuria and significant skin itching. On exam today (03/10/20) she has no true breakdown, but mild discharge. She has internal hemorrhoids which contribute to her BM pain.  -She notes tramadol did not help, will call in oxycodone 5mg  today.  -I encouraged her to increase fluid intake. I will give IV Fluids today (03/10/20)  3.Iron deficiency anemia -Etiology unclear as to her iron deficiency. S/Physterectomy -Currently managed by PCP, Dr. Joylene Draft. She receives iron infusions with Feraheme PRN, last in 02/08/20 -Mild, stable.  4. Diabetes Mellitis  -Continue to f/u with PCP for management.  5. Neutropenia, Thrombocytopenia -secondary to chemo, onset after dose 1 Mitomycin and 5FU. -S/p week 5, Neutropenia resolved, WBC 2.2, PLT 107K (03/10/20)   PLAN: -I will give her one dose Oxycodone 5mg  today, to see if she can tolerate, if yes, plan to call in to her pharmacy -UA, Urine culture and blood culture today  -Give IV Fluids today NS 1L over 2 hrs, along with zofran and dexa 8mg  each  -No RT today, I spoke with NP Bryson Ha, she will cal her  -LVF in 2 and 4 days, f/u in 4 days with NP    No problem-specific Assessment & Plan notes found for this  encounter.   Orders Placed This Encounter  Procedures  . Culture, blood (routine x 2)    Patient coming back at 3pm    Standing Status:   Standing    Number of Occurrences:   1  . Urinalysis, Routine w reflex microscopic Urine, Clean Catch    Standing Status:   Standing    Number of Occurrences:   1   All questions were answered. The patient knows to call the  clinic with any problems, questions or concerns. No barriers to learning was detected. The total time spent in the appointment was 40 minutes.     Truitt Merle, MD 03/10/2020   I, Joslyn Devon, am acting as scribe for Truitt Merle, MD.   I have reviewed the above documentation for accuracy and completeness, and I agree with the above.

## 2020-03-07 NOTE — Patient Instructions (Signed)
PICC Removal, Adult, Care After This sheet gives you information about how to care for yourself after your procedure. Your health care provider may also give you more specific instructions. If you have problems or questions, contact your health care provider. What can I expect after the procedure? After your procedure, it is common to have:  Tenderness or soreness.  Redness, swelling, or a scab where the PICC was removed (exit site). Follow these instructions at home: For the first 24 hours after the procedure   Keep the bandage (dressing) on the exit site clean and dry. Do not remove the dressing until your health care provider tells you to do so.  Check your arm often for signs and symptoms of an infection. Check for: ? A red streak that spreads away from the dressing. ? Blood or fluid that you can see on the dressing. ? More redness or swelling.  Do not lift anything heavy or do activities that require great effort until your health care provider says it is okay. You should avoid: ? Lifting weights. ? Yard work. ? Any physical activity with repetitive arm movement.  Watch closely for any signs of an air bubble in the vein (air embolism). This is a rare but serious complication. If you have signs of air embolism, call 911 immediately and lie down on your left side to keep the air from moving into the lungs. Signs of an air embolism include: ? Difficulty breathing. ? Chest pain. ? Coughing or wheezing. ? Skin that is pale, blue, cold, or clammy. ? Rapid pulse. ? Rapid breathing. ? Fainting. After 24 Hours have passed:  Remove your dressing as told by your health care provider. Make sure you wash your hands with soap and water before and after you change the dressing. If soap and water are not available, use hand sanitizer.  Return to your normal activities as told by your health care provider.  A small scab may develop over the exit site. Do not pick at the scab.  When  bathing or showering, gently wash the exit site with soap and water. Pat it dry.  Watch for signs of infection, such as: ? Fever or chills. ? Swollen glands under the arm. ? More redness, swelling, or soreness in the arm. ? Blood, fluid, or pus coming from the exit site. ? Warmth or a bad smell at the exit site. ? A red streak spreading away from the exit site. General instructions  Take over-the-counter and prescription medicines only as told by your health care provider. Do not take any new medicines without checking with your health care provider first.  If you were prescribed an antibiotic medicine, apply or take it as told by your health care provider. Do not stop using the antibiotic even if your condition improves.  Keep all follow-up visits as told by your health care provider. This is important. Contact a health care provider if:  You have a fever or chills.  You have soreness, redness, or swelling on your exit site, and it gets worse.  You have swollen glands under your arm.  You have any of the following symptoms at your exit site: ? Blood, fluid, or pus. ? Unusual warmth. ? A bad smell. ? A red streak spreading away from the exit site. Get help right away if:  You have numbness or tingling in your fingers, hand, or arm.  Your arm looks blue and feels cold.  You have signs of an air embolism, such   as: ? Difficulty breathing. ? Chest pain. ? Coughing or wheezing. ? Skin that is pale, blue, cold, or clammy. ? Rapid pulse. ? Rapid breathing. ? Fainting. These symptoms may represent a serious problem that is an emergency. Do not wait to see if the symptoms will go away. Get medical help right away. Call your local emergency services (911 in the [place] Do not drive yourself to the hospital. Summary  After your procedure, it is common to have tenderness or soreness, redness, swelling, or a scab at the exit site.  Keep the dressing over the exit site clean and dry.  Do not remove the dressing until your health care provider tells you to do so.  Do not lift anything heavy or do activities that require great effort until your health care provider says it is okay.  Watch closely for any signs of an air embolism. If you have signs of air embolism, call 911 immediately and lie down on your left side. This information is not intended to replace advice given to you by your health care provider. Make sure you discuss any questions you have with your health care provider. Document Revised: 05/06/2017 Document Reviewed: 07/20/2016 Elsevier Patient Education  2020 [employer]  

## 2020-03-07 NOTE — Progress Notes (Signed)
PICC removed without concerns.  Site unremarkable.  Vaseline gauze applied with pressure dressing & dry & intact upon d/c.  Pt knows to leave on 24 hours & to apply pressure if bleeding & to call with further concerns.

## 2020-03-10 ENCOUNTER — Encounter: Payer: Self-pay | Admitting: Radiation Oncology

## 2020-03-10 ENCOUNTER — Other Ambulatory Visit: Payer: Self-pay | Admitting: Hematology

## 2020-03-10 ENCOUNTER — Inpatient Hospital Stay: Payer: BC Managed Care – PPO

## 2020-03-10 ENCOUNTER — Inpatient Hospital Stay (HOSPITAL_BASED_OUTPATIENT_CLINIC_OR_DEPARTMENT_OTHER): Payer: BC Managed Care – PPO | Admitting: Hematology

## 2020-03-10 ENCOUNTER — Other Ambulatory Visit: Payer: Self-pay

## 2020-03-10 ENCOUNTER — Ambulatory Visit: Payer: BC Managed Care – PPO

## 2020-03-10 ENCOUNTER — Encounter: Payer: Self-pay | Admitting: Hematology

## 2020-03-10 VITALS — BP 118/75 | HR 92 | Temp 98.0°F | Resp 18 | Ht 66.0 in

## 2020-03-10 DIAGNOSIS — C210 Malignant neoplasm of anus, unspecified: Secondary | ICD-10-CM

## 2020-03-10 DIAGNOSIS — B9620 Unspecified Escherichia coli [E. coli] as the cause of diseases classified elsewhere: Secondary | ICD-10-CM | POA: Diagnosis not present

## 2020-03-10 DIAGNOSIS — E46 Unspecified protein-calorie malnutrition: Secondary | ICD-10-CM | POA: Diagnosis not present

## 2020-03-10 DIAGNOSIS — Z87891 Personal history of nicotine dependence: Secondary | ICD-10-CM | POA: Diagnosis not present

## 2020-03-10 DIAGNOSIS — K760 Fatty (change of) liver, not elsewhere classified: Secondary | ICD-10-CM | POA: Diagnosis not present

## 2020-03-10 DIAGNOSIS — R1031 Right lower quadrant pain: Secondary | ICD-10-CM | POA: Diagnosis present

## 2020-03-10 DIAGNOSIS — Z20822 Contact with and (suspected) exposure to covid-19: Secondary | ICD-10-CM | POA: Diagnosis not present

## 2020-03-10 DIAGNOSIS — E860 Dehydration: Secondary | ICD-10-CM | POA: Diagnosis not present

## 2020-03-10 DIAGNOSIS — E119 Type 2 diabetes mellitus without complications: Secondary | ICD-10-CM | POA: Diagnosis not present

## 2020-03-10 DIAGNOSIS — I4891 Unspecified atrial fibrillation: Secondary | ICD-10-CM | POA: Diagnosis not present

## 2020-03-10 DIAGNOSIS — K521 Toxic gastroenteritis and colitis: Secondary | ICD-10-CM | POA: Diagnosis not present

## 2020-03-10 DIAGNOSIS — Y842 Radiological procedure and radiotherapy as the cause of abnormal reaction of the patient, or of later complication, without mention of misadventure at the time of the procedure: Secondary | ICD-10-CM | POA: Diagnosis present

## 2020-03-10 DIAGNOSIS — K219 Gastro-esophageal reflux disease without esophagitis: Secondary | ICD-10-CM | POA: Diagnosis not present

## 2020-03-10 DIAGNOSIS — Z9221 Personal history of antineoplastic chemotherapy: Secondary | ICD-10-CM | POA: Diagnosis not present

## 2020-03-10 DIAGNOSIS — Z881 Allergy status to other antibiotic agents status: Secondary | ICD-10-CM | POA: Diagnosis not present

## 2020-03-10 DIAGNOSIS — K520 Gastroenteritis and colitis due to radiation: Secondary | ICD-10-CM | POA: Diagnosis not present

## 2020-03-10 DIAGNOSIS — D500 Iron deficiency anemia secondary to blood loss (chronic): Secondary | ICD-10-CM

## 2020-03-10 DIAGNOSIS — E876 Hypokalemia: Secondary | ICD-10-CM | POA: Diagnosis not present

## 2020-03-10 DIAGNOSIS — K529 Noninfective gastroenteritis and colitis, unspecified: Secondary | ICD-10-CM | POA: Diagnosis not present

## 2020-03-10 DIAGNOSIS — Z8249 Family history of ischemic heart disease and other diseases of the circulatory system: Secondary | ICD-10-CM | POA: Diagnosis not present

## 2020-03-10 DIAGNOSIS — Z885 Allergy status to narcotic agent status: Secondary | ICD-10-CM | POA: Diagnosis not present

## 2020-03-10 DIAGNOSIS — Z888 Allergy status to other drugs, medicaments and biological substances status: Secondary | ICD-10-CM | POA: Diagnosis not present

## 2020-03-10 DIAGNOSIS — F32A Depression, unspecified: Secondary | ICD-10-CM | POA: Diagnosis not present

## 2020-03-10 DIAGNOSIS — E785 Hyperlipidemia, unspecified: Secondary | ICD-10-CM | POA: Diagnosis not present

## 2020-03-10 DIAGNOSIS — D61810 Antineoplastic chemotherapy induced pancytopenia: Secondary | ICD-10-CM | POA: Diagnosis not present

## 2020-03-10 DIAGNOSIS — R112 Nausea with vomiting, unspecified: Secondary | ICD-10-CM | POA: Diagnosis not present

## 2020-03-10 DIAGNOSIS — N390 Urinary tract infection, site not specified: Secondary | ICD-10-CM | POA: Diagnosis not present

## 2020-03-10 DIAGNOSIS — R109 Unspecified abdominal pain: Secondary | ICD-10-CM | POA: Diagnosis not present

## 2020-03-10 DIAGNOSIS — Z6824 Body mass index (BMI) 24.0-24.9, adult: Secondary | ICD-10-CM | POA: Diagnosis not present

## 2020-03-10 DIAGNOSIS — T451X5A Adverse effect of antineoplastic and immunosuppressive drugs, initial encounter: Secondary | ICD-10-CM | POA: Diagnosis not present

## 2020-03-10 DIAGNOSIS — Z880 Allergy status to penicillin: Secondary | ICD-10-CM | POA: Diagnosis not present

## 2020-03-10 LAB — CBC WITH DIFFERENTIAL (CANCER CENTER ONLY)
Abs Immature Granulocytes: 0.01 10*3/uL (ref 0.00–0.07)
Basophils Absolute: 0 10*3/uL (ref 0.0–0.1)
Basophils Relative: 1 %
Eosinophils Absolute: 0.2 10*3/uL (ref 0.0–0.5)
Eosinophils Relative: 9 %
HCT: 36.2 % (ref 36.0–46.0)
Hemoglobin: 11.8 g/dL — ABNORMAL LOW (ref 12.0–15.0)
Immature Granulocytes: 0 %
Lymphocytes Relative: 5 %
Lymphs Abs: 0.1 10*3/uL — ABNORMAL LOW (ref 0.7–4.0)
MCH: 26.3 pg (ref 26.0–34.0)
MCHC: 32.6 g/dL (ref 30.0–36.0)
MCV: 80.6 fL (ref 80.0–100.0)
Monocytes Absolute: 0.3 10*3/uL (ref 0.1–1.0)
Monocytes Relative: 11 %
Neutro Abs: 1.7 10*3/uL (ref 1.7–7.7)
Neutrophils Relative %: 74 %
Platelet Count: 107 10*3/uL — ABNORMAL LOW (ref 150–400)
RBC: 4.49 MIL/uL (ref 3.87–5.11)
RDW: 16.6 % — ABNORMAL HIGH (ref 11.5–15.5)
WBC Count: 2.2 10*3/uL — ABNORMAL LOW (ref 4.0–10.5)
nRBC: 0 % (ref 0.0–0.2)

## 2020-03-10 LAB — URINALYSIS, ROUTINE W REFLEX MICROSCOPIC
Bilirubin Urine: NEGATIVE
Glucose, UA: 50 mg/dL — AB
Ketones, ur: NEGATIVE mg/dL
Leukocytes,Ua: NEGATIVE
Nitrite: POSITIVE — AB
Protein, ur: 100 mg/dL — AB
RBC / HPF: >50 RBC/hpf — ABNORMAL HIGH (ref 0–5)
Specific Gravity, Urine: 1.024 (ref 1.005–1.030)
WBC, UA: >50 WBC/hpf — ABNORMAL HIGH (ref 0–5)
pH: 5 (ref 5.0–8.0)

## 2020-03-10 LAB — IRON AND TIBC
Iron: 49 ug/dL (ref 41–142)
Saturation Ratios: 18 % — ABNORMAL LOW (ref 21–57)
TIBC: 268 ug/dL (ref 236–444)
UIBC: 219 ug/dL (ref 120–384)

## 2020-03-10 LAB — CMP (CANCER CENTER ONLY)
ALT: 12 U/L (ref 0–44)
AST: 10 U/L — ABNORMAL LOW (ref 15–41)
Albumin: 3.2 g/dL — ABNORMAL LOW (ref 3.5–5.0)
Alkaline Phosphatase: 53 U/L (ref 38–126)
Anion gap: 8 (ref 5–15)
BUN: 17 mg/dL (ref 8–23)
CO2: 27 mmol/L (ref 22–32)
Calcium: 9.6 mg/dL (ref 8.9–10.3)
Chloride: 103 mmol/L (ref 98–111)
Creatinine: 0.85 mg/dL (ref 0.44–1.00)
GFR, Est AFR Am: >60 mL/min (ref >60)
GFR, Estimated: >60 mL/min (ref >60)
Glucose, Bld: 157 mg/dL — ABNORMAL HIGH (ref 70–99)
Potassium: 3.4 mmol/L — ABNORMAL LOW (ref 3.5–5.1)
Sodium: 138 mmol/L (ref 135–145)
Total Bilirubin: 0.4 mg/dL (ref 0.3–1.2)
Total Protein: 6.9 g/dL (ref 6.5–8.1)

## 2020-03-10 LAB — FERRITIN: Ferritin: 419 ng/mL — ABNORMAL HIGH (ref 11–307)

## 2020-03-10 MED ORDER — ONDANSETRON HCL 4 MG/2ML IJ SOLN
8.0000 mg | Freq: Once | INTRAMUSCULAR | Status: AC
  Administered 2020-03-10: 8 mg via INTRAVENOUS

## 2020-03-10 MED ORDER — SODIUM CHLORIDE 0.9 % IV SOLN: Freq: Once | INTRAVENOUS | Status: DC

## 2020-03-10 MED ORDER — OXYCODONE HCL 5 MG PO TABS
ORAL_TABLET | ORAL | Status: AC
  Filled 2020-03-10: qty 1

## 2020-03-10 MED ORDER — SODIUM CHLORIDE 0.9 % IV SOLN
Freq: Once | INTRAVENOUS | Status: AC
  Filled 2020-03-10: qty 250

## 2020-03-10 MED ORDER — ONDANSETRON HCL 4 MG/2ML IJ SOLN
INTRAMUSCULAR | Status: AC
  Filled 2020-03-10: qty 4

## 2020-03-10 MED ORDER — OXYCODONE HCL 5 MG PO TABS: 5.0000 mg | ORAL_TABLET | ORAL | 0 refills | Status: DC | PRN

## 2020-03-10 MED ORDER — OXYCODONE HCL 5 MG PO TABS
5.0000 mg | ORAL_TABLET | Freq: Once | ORAL | Status: DC
  Administered 2020-03-10: 5 mg via ORAL

## 2020-03-10 MED ORDER — DEXAMETHASONE SODIUM PHOSPHATE 10 MG/ML IJ SOLN
INTRAMUSCULAR | Status: AC
  Filled 2020-03-10: qty 1

## 2020-03-10 MED ORDER — DEXAMETHASONE SODIUM PHOSPHATE 10 MG/ML IJ SOLN
8.0000 mg | Freq: Once | INTRAMUSCULAR | Status: AC
  Administered 2020-03-10: 8 mg via INTRAVENOUS

## 2020-03-10 NOTE — Progress Notes (Signed)
I spoke with Dr. Burr Medico today and reviewed the patient's symptoms which sound like they are progressing.  She complains of constant itching, last week we had talked about the option of continuing therapy when she met with Dr. Lisbeth Renshaw Friday.  The week prior when I had spoken with her was that she could try some topical diphenhydramine to her skin since Dermoplast had not helped.  She also was counseled on the option to try oral Benadryl  knowing that that could potentially cause her to have some tiredness, she was not interested in doing this out of concern that the medications seem to affect her more significantly than average patients.  I called her and left a message this afternoon asking her to call us back so we can make further definitive plans for the remainder for treatments that are scheduled.

## 2020-03-10 NOTE — Progress Notes (Signed)
Pt with an allergy to Prednisolone (heart palpitations). She is willing to try Dex today.  Hardie Pulley, PharmD, BCPS, BCOP

## 2020-03-10 NOTE — Patient Instructions (Signed)

## 2020-03-11 ENCOUNTER — Telehealth: Payer: Self-pay | Admitting: Hematology

## 2020-03-11 ENCOUNTER — Ambulatory Visit: Payer: BC Managed Care – PPO

## 2020-03-11 NOTE — Telephone Encounter (Signed)
Scheduled appt per 10/04 sch msg - pt is aware of appts.

## 2020-03-12 ENCOUNTER — Ambulatory Visit: Payer: BC Managed Care – PPO

## 2020-03-12 ENCOUNTER — Other Ambulatory Visit: Payer: Self-pay | Admitting: Hematology

## 2020-03-12 ENCOUNTER — Telehealth: Payer: Self-pay | Admitting: Radiation Oncology

## 2020-03-12 ENCOUNTER — Telehealth: Payer: Self-pay | Admitting: Hematology

## 2020-03-12 ENCOUNTER — Telehealth: Payer: Self-pay

## 2020-03-12 ENCOUNTER — Other Ambulatory Visit: Payer: Self-pay

## 2020-03-12 ENCOUNTER — Telehealth: Payer: Self-pay | Admitting: *Deleted

## 2020-03-12 DIAGNOSIS — N390 Urinary tract infection, site not specified: Secondary | ICD-10-CM

## 2020-03-12 LAB — URINE CULTURE: Culture: 10000 — AB

## 2020-03-12 MED ORDER — NITROFURANTOIN MONOHYD MACRO 100 MG PO CAPS: 100.0000 mg | ORAL_CAPSULE | Freq: Two times a day (BID) | ORAL | 0 refills | Status: DC

## 2020-03-12 MED ORDER — LORAZEPAM 0.5 MG PO TABS: 0.5000 mg | ORAL_TABLET | Freq: Three times a day (TID) | ORAL | 0 refills | Status: DC

## 2020-03-12 NOTE — Progress Notes (Signed)
[place]   [phone] [phone]   Clinic Follow up Note   Patient Care Team: Crist Infante, MD as PCP - General (Internal Medicine) Fay Records, MD as PCP - Cardiology (Cardiology) Nobie Putnam, MD (Hematology and Oncology) Heilingoetter, Tobe Sos, PA-C as Physician Assistant (Oncology) Truitt Merle, MD as Consulting Physician (Oncology) Jonnie Finner, RN as Oncology Nurse Navigator  Date of Service:  03/13/2020  CHIEF COMPLAINT: fever, nausea and abdominal pain   SUMMARY OF ONCOLOGIC HISTORY: Oncology History Overview Note  Cancer Staging No matching staging information was found for the patient.    Squamous cell cancer, anus ([place])  07/13/2019 Procedure    EUA, Excision of posterior internal/external hemorrhoid under the care of Dr. Hassell Done    07/13/2019 Pathology Results   - Invasive moderately differentiated squamous cell carcinoma, 1.4 cm.  See comment  - Carcinoma invades for depth of 0.4 cm  - Deep resection margin is negative for carcinoma (0.2 cm)  - Lateral mucosal margin is positive for high-grade dysplasia  - No evidence of lymphovascular perineural invasion  Procedure: Local excision  Tumor Site: Anal canal  Tumor Size: 1.4 cm  Histologic Type: Invasive squamous cell carcinoma  Histologic Grade: G2: Moderately differentiated  Tumor Extension: Carcinoma invades superficial anal sphincter muscle  Margins: Uninvolved by tumor  Treatment Effect: N/A  Regional Lymph Nodes: No lymph nodes submitted or found  Pathologic Stage Classification (pTNM, AJCC 8th Edition):  pT1, pNx  Representative Tumor Block: A1  Comment(s): Lateral mucosal margin is positive for high-grade squamous  dysplasia      07/13/2019 Initial Diagnosis   Squamous cell cancer, anus ([place])   09/04/2019 Imaging   CT scan Chest, Abdomen, and Pelvis   IMPRESSION: 1. No evidence of metastatic disease in the chest, abdomen or pelvis. 2. No discrete  anorectal mass. 3. Chronic findings include: Punctate nonobstructing upper right renal stones and chronic right renal scarring. 4. Aortic Atherosclerosis (ICD10-I70.0).   01/10/2020 Pathology Results   A. ANAL LESION, POSTERIOR MIDLINE, EXCISION:  - Squamous cell carcinoma, moderately differentiated.  Verbally reported by Dr. Saralyn Pilar 1.5 cm, negative margins, depth <1 mm   01/11/2020 Procedure   1. Excision of anal canal lesion (posterior midline) under the care of Dr. Dema Severin     01/28/2020 PET scan   IMPRESSION: 1. Mild focal anal hypermetabolism without discrete mass correlate on the CT images, nonspecific, differential includes postsurgical change or residual anal tumor. 2. No hypermetabolic locoregional or distant metastatic disease. 3. Chronic findings include: Aortic Atherosclerosis (ICD10-I70.0). Diffuse hepatic steatosis. Nonobstructing right nephrolithiasis.     02/04/2020 - 03/13/2020 Radiation Therapy   concurrent chemoRT by Dr Lisbeth Renshaw with Mitomycin and 5FU starting 02/04/20-03/13/20   02/04/2020 - 03/03/2020 Chemotherapy   Concurrent chemoRT with Mitomycin and 5FU on week 1 and 5 starting 02/04/20-03/03/20      CURRENT THERAPY:  Concurrent chemoRT withMitomycin and 5FU on week 1 and 5starting 02/04/20-03/13/20, last chemo and radiation treatment on 03/07/2020, RT has been held since then   INTERVAL HISTORY:  [name] is here for a follow up. She presents to the clinic with her husband. She came in a wheelchair. She is not doing well at home. She has persistent severe abdominal pain, skin irritation and pain in the perianal and groin area, persistent nausea and intermittent vomiting, fever up to 100.4 last night. I spoke with her yesterday, and called in [place] for her UTI, took 1 dose, but vomited afterwards. She  is also very concerned about her heart rate to be high and low after taking Zofran and oxycodone at home. She is nervous, stressed out, cried during her visit  with me today. Husband came in with her. She has not been able to keep much food down, has intermittent dizziness also.   All other systems were reviewed with the patient and are negative.  MEDICAL HISTORY:  Past Medical History:  Diagnosis Date  . Diabetes mellitus type 2, controlled ([place])   . Dyslipidemia    untreated  . GERD (gastroesophageal reflux disease)   . Hemorrhoids   . Microcytic anemia 06/22/2012  . Nephrolith   . Palpitation    positive tilt table test. Prozac and Inderal treatment effective.    SURGICAL HISTORY: Past Surgical History:  Procedure Laterality Date  . APPENDECTOMY    . BREAST REDUCTION SURGERY    . CARDIAC CATHETERIZATION  2002   normal LV function and no significant coronary obstruction  . CATARACT EXTRACTION, BILATERAL Bilateral november 2020 and dcember 2020  . CYST EXCISION N/A 01/11/2020   Procedure: EXCISION OF ANAL CANAL MASS;  Surgeon: Ileana Roup, MD;  Location: [facility];  Service: General;  Laterality: N/A;  . ESOPHAGEAL MANOMETRY N/A 04/04/2017   Procedure: ESOPHAGEAL MANOMETRY (EM);  Surgeon: Laurence Spates, MD;  Location: [facility];  Service: Endoscopy;  Laterality: N/A;  . HEMORRHOID SURGERY N/A 07/13/2019   Procedure: HEMORRHOIDECTOMY;  Surgeon: Johnathan Hausen, MD;  Location: [facility];  Service: General;  Laterality: N/A;  . KIDNEY SURGERY     kidney stones   . PARTIAL HYSTERECTOMY    . RECTAL EXAM UNDER ANESTHESIA N/A 01/11/2020   Procedure: RECTAL EXAM UNDER ANESTHESIA;  Surgeon: Ileana Roup, MD;  Location: [facility];  Service: General;  Laterality: N/A;  . TONSILLECTOMY      I have reviewed the social history and family history with the patient and they are unchanged from previous note.  ALLERGIES:  is allergic to cephalexin, amoxapine and related, amoxicillin-pot clavulanate, atorvastatin, cefaclor, ciprofloxacin, codeine, doxycycline calcium, fenofibrate micronized, fish oil, flagyl [metronidazole  hcl], levofloxacin in d5w, metronidazole, ofloxacin, prednisolone, promethazine hcl, tetracycline hcl, erythromycin, and nitrofurantoin.  MEDICATIONS:  Current Outpatient Medications  Medication Sig Dispense Refill  . clorazepate (TRANXENE) 7.5 MG tablet Take 7.5 mg by mouth 2 (two) times daily.  1  . CRESTOR 5 MG tablet Take 5 mg by mouth every evening.   1  . diphenoxylate-atropine (LOMOTIL) 2.5-0.025 MG tablet Take 1-2 tablets by mouth 4 (four) times daily as needed for diarrhea or loose stools. (Patient not taking: Reported on 02/25/2020) 60 tablet 1  . esomeprazole (NEXIUM) 40 MG capsule Take 40 mg by mouth daily at 12 noon.    [name] FLUoxetine (PROZAC) 20 MG capsule Take 20 mg by mouth every morning.    . hyoscyamine (ANASPAZ) 0.125 MG TBDP disintergrating tablet Place 1 tablet (0.125 mg total) under the tongue every 6 (six) hours as needed. 30 tablet 2  . ibuprofen (ADVIL) 800 MG tablet Take 1 tablet (800 mg total) by mouth every 8 (eight) hours as needed. (Patient not taking: Reported on 01/10/2020) 30 tablet 0  . Loperamide HCl (IMODIUM PO) Take 1 capsule by mouth every 6 (six) hours as needed.    [name] LORazepam (ATIVAN) 0.5 MG tablet Take 1 tablet (0.5 mg total) by mouth every 8 (eight) hours. 15 tablet 0  . metFORMIN (GLUCOPHAGE) 500 MG tablet Take by mouth 2 (two) times daily with a  meal.    . methenamine (HIPREX) 1 g tablet Take 0.5 g by mouth daily.     . nitrofurantoin, macrocrystal-monohydrate, (MACROBID) 100 MG capsule Take 1 capsule (100 mg total) by mouth 2 (two) times daily. 14 capsule 0  . NU-IRON 150 MG capsule Take 150 mg by mouth daily.   2  . ondansetron (ZOFRAN) 8 MG tablet Take 1 tablet (8 mg total) by mouth 2 (two) times daily as needed (Nausea or vomiting). 30 tablet 1  . oxyCODONE (OXY IR/ROXICODONE) 5 MG immediate release tablet Take 1 tablet (5 mg total) by mouth every 4 (four) hours as needed for severe pain. 30 tablet 0  . polyvinyl alcohol (LIQUIFILM TEARS) 1.4 %  ophthalmic solution Place 1 drop into both eyes as needed for dry eyes.    [name] prochlorperazine (COMPAZINE) 10 MG tablet Take 1 tablet (10 mg total) by mouth every 6 (six) hours as needed (Nausea or vomiting). 30 tablet 1  . propranolol (INDERAL) 10 MG tablet TAKE 1 TABLET BY MOUTH TWICE A DAY (Patient taking differently: Take 10 mg by mouth 2 (two) times daily. ) 180 tablet 2  . silver sulfADIAZINE (SILVADENE) 1 % cream Apply 1 application topically daily. 400 g 0  . sulfamethoxazole-trimethoprim (BACTRIM) 400-80 MG tablet Take 2 tablets by mouth 2 (two) times daily. 20 tablet 0   Current Facility-Administered Medications  Medication Dose Route Frequency Provider Last Rate Last Admin  . morphine 2 MG/ML injection 2 mg  2 mg Intravenous Once Truitt Merle, MD        PHYSICAL EXAMINATION: ECOG PERFORMANCE STATUS: 4 - Bedbound  Vitals:   03/13/20 1200  BP: 92/67  Pulse: 92  Resp: 20  Temp: 98.9 F (37.2 C)  SpO2: 96%   There were no vitals filed for this visit.  GENERAL:alert,  Distressed, in wheelchair  SKIN: skin color, texture, turgor are normal, no rashes or significant lesions EYES: normal, Conjunctiva are pink and non-injected, sclera clear Musculoskeletal:no cyanosis of digits and no clubbing  NEURO: alert & oriented x 3 with fluent speech, no focal motor/sensory deficits  LABORATORY DATA:  I have reviewed the data as listed CBC Latest Ref Rng & Units 03/13/2020 03/10/2020 03/03/2020  WBC 4.0 - 10.5 K/uL 2.6(L) 2.2(L) 3.2(L)  Hemoglobin 12.0 - 15.0 g/dL 10.8(L) 11.8(L) 11.2(L)  Hematocrit 36 - 46 % 32.7(L) 36.2 34.2(L)  Platelets 150 - 400 K/uL 128(L) 107(L) 126(L)     CMP Latest Ref Rng & Units 03/13/2020 03/10/2020 03/03/2020  Glucose 70 - 99 mg/dL 153(H) 157(H) 119(H)  BUN 8 - 23 mg/dL 13 17 15   Creatinine 0.44 - 1.00 mg/dL 0.76 0.85 0.74  Sodium 135 - 145 mmol/L 136 138 141  Potassium 3.5 - 5.1 mmol/L 3.0(LL) 3.4(L) 3.6  Chloride 98 - 111 mmol/L 103 103 107  CO2 22 - 32  mmol/L 26 27 30   Calcium 8.9 - 10.3 mg/dL 9.0 9.6 9.3  Total Protein 6.5 - 8.1 g/dL 6.2(L) 6.9 6.2(L)  Total Bilirubin 0.3 - 1.2 mg/dL 0.5 0.4 0.3  Alkaline Phos 38 - 126 U/L 54 53 43  AST 15 - 41 U/L 9(L) 10(L) 17  ALT 0 - 44 U/L 10 12 25       RADIOGRAPHIC STUDIES: I have personally reviewed the radiological images as listed and agreed with the findings in the report. No results found.   ASSESSMENT & PLAN:  Melinda [name] is a 59 y.o. female with   1.Recurrent squamous cell carcinoma the anus,  pT1N0M0 -She initially presented to Dr Theophilus Bones hemorrhoidectomyand Dema Severin 2/5/21withinvasive moderately differentiated squamous cell carcinoma, 1.4 cm,pT1, pNx. -She had local recurrence,underwent surgical excision under the care of Dr. Dema Severin on 01/11/20.Surgical pathwas consistent with squamous cell carcinoma of the anus.01/28/11 staging PETshowed nonode ordistant metastasis. -Idiscussed standard treatment for anal cancer includesconcurrent chemoRTfor 6 weekswith Mitomycin and5FU on week 1 and 5.shestarted on8/30/21.Plan to complete RT 03/13/20. -S/p week 5 treatment she tolerated poorly with worsening abdominal and rectal pain, dysuria, nausea/vomiting, along with further skin and local irritation of groin, rectum and urethra leading to severe pain.  -RT has been held this week  -Despite aggressive supportive care in our clinic, patient is not doing well, has developed a UTI, not able to tolerate oral antibiotics, failure to thrive at home,. I recommend hospital admission today, he agrees.   2. Diarrhea, Nausea,andradiation dermatitisand pain, hypotension  -She is able to tolerate oral intake adequately, continue follow-up with dietitian -S/p week 5 treatment her skin irration worsened with severe dysuria and significant skin itching. On 03/10/20 exam she has no true breakdown, but mild discharge. She has internal hemorrhoids which contributes to her BM pain.  -She  notes tramadol did not help. I started her on oxycodone 5mg  (03/10/20) -Urine culture a few days ago showed E. coli, pansensitive, I called in [place] yesterday she was not able to tolerate it due to nausea -will give iv antibiotics today    3.Iron deficiency anemia -Etiology unclear as to her iron deficiency. S/Physterectomy -Currently managed by PCP, Dr. Joylene Draft. She receives iron infusions with Feraheme PRN, last in 02/08/20 -Mild, stable.  4. Diabetes Mellitis  -Continue to f/u with PCP for management.  5. Neutropenia, Thrombocytopenia -Secondary to chemo, overall mild   6. Hyponatremia -Potassium 3.0 today, will give IV 20 mEq today   PLAN: -NL 1L with KCL 48meq over 2 hours  -IV Zofran and dexamethasone 8mg   -IV morphine 2mg  once -will admit pt [facility]. I spoke with hospitalist Dr. Marylyn Ishihara and he kindly agreed to admit her  -she has multiple allergy to antibiotics, can do iv cipro when she gets admitted, or first dose here if hospital admission gets delayed    No problem-specific Assessment & Plan notes found for this encounter.   No orders of the defined types were placed in this encounter.  All questions were answered. The patient knows to call the clinic with any problems, questions or concerns. No barriers to learning was detected. The total time spent in the appointment was 30 minutes.     Truitt Merle, MD 03/13/2020   I, Joslyn Devon, am acting as scribe for Truitt Merle, MD.   I have reviewed the above documentation for accuracy and completeness, and I agree with the above.

## 2020-03-12 NOTE — Telephone Encounter (Signed)
No 10/6 los

## 2020-03-12 NOTE — Telephone Encounter (Signed)
Ms Melinda [name] called stating that she has nausea/vomiting and rectal pain.  She states she took a zofran and oxycodone this am.  She states that her HR went low and then high. She states she then had pain in her chest.  She denies dyspnea.  She has been tolerating moderate amt of liquids.  She drank some ensure while speaking with me and then vomited.  She states she has not taken zofran since early this am.  I told her she needs to take the zofran every 8 hours and alternate with compazine.  She said after she took the compazine she had trouble opening her mouth.  I reviewed with Dr. Burr Medico.  Urine culture results reviewed with Dr. Priscille Heidelberg rx sent to her pharmacy.  I reviewed with Ms Melinda [name].

## 2020-03-12 NOTE — Telephone Encounter (Signed)
Spoke with the patient and she states she is not doing well at all.  She states she does not know if she can continue with her radiation treatments.  She is having a great deal of pain, weakness, decreased appetite, and vomiting.  She was advised that her symptoms are a combination of side effects from chemo and radiation.  She is trying to take nausea medication as well as pain medication but is unable to keep it down.  I let her know that I would speak with the PA and give her a call back with further recommendations.  She verbalized understanding.  Gloriajean Dell. Leonie Green, BSN

## 2020-03-13 ENCOUNTER — Ambulatory Visit: Payer: BC Managed Care – PPO

## 2020-03-13 ENCOUNTER — Other Ambulatory Visit: Payer: Self-pay

## 2020-03-13 ENCOUNTER — Encounter (HOSPITAL_COMMUNITY): Payer: Self-pay | Admitting: Internal Medicine

## 2020-03-13 ENCOUNTER — Inpatient Hospital Stay (HOSPITAL_BASED_OUTPATIENT_CLINIC_OR_DEPARTMENT_OTHER): Payer: BC Managed Care – PPO | Admitting: Hematology

## 2020-03-13 ENCOUNTER — Inpatient Hospital Stay: Payer: BC Managed Care – PPO

## 2020-03-13 ENCOUNTER — Encounter: Payer: Self-pay | Admitting: Hematology

## 2020-03-13 ENCOUNTER — Inpatient Hospital Stay (HOSPITAL_COMMUNITY): Payer: BC Managed Care – PPO

## 2020-03-13 ENCOUNTER — Inpatient Hospital Stay (HOSPITAL_COMMUNITY)
Admission: AD | Admit: 2020-03-13 | Discharge: 2020-03-20 | DRG: 393 | Disposition: A | Discharge status: Home / Self Care | Payer: BC Managed Care – PPO | Source: Ambulatory Visit | Attending: Internal Medicine | Admitting: Internal Medicine

## 2020-03-13 VITALS — BP 92/67 | HR 92 | Temp 98.9°F | Resp 20

## 2020-03-13 DIAGNOSIS — F32A Depression, unspecified: Secondary | ICD-10-CM | POA: Diagnosis present

## 2020-03-13 DIAGNOSIS — E785 Hyperlipidemia, unspecified: Secondary | ICD-10-CM | POA: Diagnosis present

## 2020-03-13 DIAGNOSIS — Z8249 Family history of ischemic heart disease and other diseases of the circulatory system: Secondary | ICD-10-CM

## 2020-03-13 DIAGNOSIS — C210 Malignant neoplasm of anus, unspecified: Secondary | ICD-10-CM

## 2020-03-13 DIAGNOSIS — K219 Gastro-esophageal reflux disease without esophagitis: Secondary | ICD-10-CM | POA: Diagnosis present

## 2020-03-13 DIAGNOSIS — Y842 Radiological procedure and radiotherapy as the cause of abnormal reaction of the patient, or of later complication, without mention of misadventure at the time of the procedure: Secondary | ICD-10-CM | POA: Diagnosis present

## 2020-03-13 DIAGNOSIS — E46 Unspecified protein-calorie malnutrition: Secondary | ICD-10-CM | POA: Diagnosis present

## 2020-03-13 DIAGNOSIS — I4891 Unspecified atrial fibrillation: Secondary | ICD-10-CM | POA: Diagnosis present

## 2020-03-13 DIAGNOSIS — Z885 Allergy status to narcotic agent status: Secondary | ICD-10-CM | POA: Diagnosis not present

## 2020-03-13 DIAGNOSIS — B9620 Unspecified Escherichia coli [E. coli] as the cause of diseases classified elsewhere: Secondary | ICD-10-CM | POA: Diagnosis present

## 2020-03-13 DIAGNOSIS — Z87891 Personal history of nicotine dependence: Secondary | ICD-10-CM | POA: Diagnosis not present

## 2020-03-13 DIAGNOSIS — R112 Nausea with vomiting, unspecified: Secondary | ICD-10-CM | POA: Diagnosis present

## 2020-03-13 DIAGNOSIS — E860 Dehydration: Secondary | ICD-10-CM | POA: Diagnosis present

## 2020-03-13 DIAGNOSIS — Z888 Allergy status to other drugs, medicaments and biological substances status: Secondary | ICD-10-CM | POA: Diagnosis not present

## 2020-03-13 DIAGNOSIS — Z880 Allergy status to penicillin: Secondary | ICD-10-CM

## 2020-03-13 DIAGNOSIS — K520 Gastroenteritis and colitis due to radiation: Secondary | ICD-10-CM | POA: Diagnosis present

## 2020-03-13 DIAGNOSIS — K529 Noninfective gastroenteritis and colitis, unspecified: Secondary | ICD-10-CM | POA: Diagnosis not present

## 2020-03-13 DIAGNOSIS — K521 Toxic gastroenteritis and colitis: Secondary | ICD-10-CM | POA: Diagnosis present

## 2020-03-13 DIAGNOSIS — E119 Type 2 diabetes mellitus without complications: Secondary | ICD-10-CM | POA: Diagnosis present

## 2020-03-13 DIAGNOSIS — Z79899 Other long term (current) drug therapy: Secondary | ICD-10-CM

## 2020-03-13 DIAGNOSIS — D61810 Antineoplastic chemotherapy induced pancytopenia: Secondary | ICD-10-CM | POA: Diagnosis present

## 2020-03-13 DIAGNOSIS — E876 Hypokalemia: Secondary | ICD-10-CM

## 2020-03-13 DIAGNOSIS — N390 Urinary tract infection, site not specified: Secondary | ICD-10-CM | POA: Diagnosis present

## 2020-03-13 DIAGNOSIS — Z20822 Contact with and (suspected) exposure to covid-19: Secondary | ICD-10-CM | POA: Diagnosis present

## 2020-03-13 DIAGNOSIS — Z9221 Personal history of antineoplastic chemotherapy: Secondary | ICD-10-CM | POA: Diagnosis not present

## 2020-03-13 DIAGNOSIS — R1031 Right lower quadrant pain: Secondary | ICD-10-CM | POA: Diagnosis present

## 2020-03-13 DIAGNOSIS — T451X5A Adverse effect of antineoplastic and immunosuppressive drugs, initial encounter: Secondary | ICD-10-CM | POA: Diagnosis present

## 2020-03-13 DIAGNOSIS — Z881 Allergy status to other antibiotic agents status: Secondary | ICD-10-CM | POA: Diagnosis not present

## 2020-03-13 DIAGNOSIS — Z6824 Body mass index (BMI) 24.0-24.9, adult: Secondary | ICD-10-CM | POA: Diagnosis not present

## 2020-03-13 DIAGNOSIS — D500 Iron deficiency anemia secondary to blood loss (chronic): Secondary | ICD-10-CM

## 2020-03-13 DIAGNOSIS — Z7984 Long term (current) use of oral hypoglycemic drugs: Secondary | ICD-10-CM

## 2020-03-13 LAB — CBC
HCT: 31.6 % — ABNORMAL LOW (ref 36.0–46.0)
Hemoglobin: 10 g/dL — ABNORMAL LOW (ref 12.0–15.0)
MCH: 26.2 pg (ref 26.0–34.0)
MCHC: 31.6 g/dL (ref 30.0–36.0)
MCV: 82.7 fL (ref 80.0–100.0)
Platelets: 132 10*3/uL — ABNORMAL LOW (ref 150–400)
RBC: 3.82 MIL/uL — ABNORMAL LOW (ref 3.87–5.11)
RDW: 16.4 % — ABNORMAL HIGH (ref 11.5–15.5)
WBC: 1.6 10*3/uL — ABNORMAL LOW (ref 4.0–10.5)
nRBC: 0 % (ref 0.0–0.2)

## 2020-03-13 LAB — GLUCOSE, CAPILLARY
Glucose-Capillary: 169 mg/dL — ABNORMAL HIGH (ref 70–99)
Glucose-Capillary: 161 mg/dL — ABNORMAL HIGH (ref 70–99)

## 2020-03-13 LAB — CBC WITH DIFFERENTIAL (CANCER CENTER ONLY)
Abs Immature Granulocytes: 0.01 10*3/uL (ref 0.00–0.07)
Basophils Absolute: 0 10*3/uL (ref 0.0–0.1)
Basophils Relative: 1 %
Eosinophils Absolute: 0.1 10*3/uL (ref 0.0–0.5)
Eosinophils Relative: 4 %
HCT: 32.7 % — ABNORMAL LOW (ref 36.0–46.0)
Hemoglobin: 10.8 g/dL — ABNORMAL LOW (ref 12.0–15.0)
Immature Granulocytes: 0 %
Lymphocytes Relative: 5 %
Lymphs Abs: 0.1 10*3/uL — ABNORMAL LOW (ref 0.7–4.0)
MCH: 26.2 pg (ref 26.0–34.0)
MCHC: 33 g/dL (ref 30.0–36.0)
MCV: 79.2 fL — ABNORMAL LOW (ref 80.0–100.0)
Monocytes Absolute: 0.6 10*3/uL (ref 0.1–1.0)
Monocytes Relative: 23 %
Neutro Abs: 1.8 10*3/uL (ref 1.7–7.7)
Neutrophils Relative %: 67 %
Platelet Count: 128 10*3/uL — ABNORMAL LOW (ref 150–400)
RBC: 4.13 MIL/uL (ref 3.87–5.11)
RDW: 16.2 % — ABNORMAL HIGH (ref 11.5–15.5)
WBC Count: 2.6 10*3/uL — ABNORMAL LOW (ref 4.0–10.5)
nRBC: 0 % (ref 0.0–0.2)

## 2020-03-13 LAB — CMP (CANCER CENTER ONLY)
ALT: 10 U/L (ref 0–44)
AST: 9 U/L — ABNORMAL LOW (ref 15–41)
Albumin: 2.8 g/dL — ABNORMAL LOW (ref 3.5–5.0)
Alkaline Phosphatase: 54 U/L (ref 38–126)
Anion gap: 7 (ref 5–15)
BUN: 13 mg/dL (ref 8–23)
CO2: 26 mmol/L (ref 22–32)
Calcium: 9 mg/dL (ref 8.9–10.3)
Chloride: 103 mmol/L (ref 98–111)
Creatinine: 0.76 mg/dL (ref 0.44–1.00)
GFR, Estimated: >60 mL/min (ref >60)
Glucose, Bld: 153 mg/dL — ABNORMAL HIGH (ref 70–99)
Potassium: 3 mmol/L — CL (ref 3.5–5.1)
Sodium: 136 mmol/L (ref 135–145)
Total Bilirubin: 0.5 mg/dL (ref 0.3–1.2)
Total Protein: 6.2 g/dL — ABNORMAL LOW (ref 6.5–8.1)

## 2020-03-13 LAB — COMPREHENSIVE METABOLIC PANEL
ALT: 11 U/L (ref 0–44)
AST: 11 U/L — ABNORMAL LOW (ref 15–41)
Albumin: 2.9 g/dL — ABNORMAL LOW (ref 3.5–5.0)
Alkaline Phosphatase: 45 U/L (ref 38–126)
Anion gap: 8 (ref 5–15)
BUN: 13 mg/dL (ref 8–23)
CO2: 23 mmol/L (ref 22–32)
Calcium: 8.1 mg/dL — ABNORMAL LOW (ref 8.9–10.3)
Chloride: 106 mmol/L (ref 98–111)
Creatinine, Ser: 0.59 mg/dL (ref 0.44–1.00)
GFR calc non Af Amer: >60 mL/min (ref >60)
Glucose, Bld: 174 mg/dL — ABNORMAL HIGH (ref 70–99)
Potassium: 3.4 mmol/L — ABNORMAL LOW (ref 3.5–5.1)
Sodium: 137 mmol/L (ref 135–145)
Total Bilirubin: 0.4 mg/dL (ref 0.3–1.2)
Total Protein: 5.7 g/dL — ABNORMAL LOW (ref 6.5–8.1)

## 2020-03-13 LAB — MAGNESIUM: Magnesium: 1.6 mg/dL — ABNORMAL LOW (ref 1.7–2.4)

## 2020-03-13 MED ORDER — DEXAMETHASONE SODIUM PHOSPHATE 10 MG/ML IJ SOLN
INTRAMUSCULAR | Status: AC
  Filled 2020-03-13: qty 1

## 2020-03-13 MED ORDER — VANCOMYCIN HCL 750 MG/150ML IV SOLN
750.0000 mg | Freq: Once | INTRAVENOUS | Status: AC
  Administered 2020-03-13: 750 mg via INTRAVENOUS
  Filled 2020-03-13: qty 150

## 2020-03-13 MED ORDER — SODIUM CHLORIDE 0.9 % IV SOLN: Freq: Once | INTRAVENOUS | Status: DC

## 2020-03-13 MED ORDER — DEXAMETHASONE SODIUM PHOSPHATE 10 MG/ML IJ SOLN
8.0000 mg | Freq: Once | INTRAMUSCULAR | Status: AC
  Administered 2020-03-13: 8 mg via INTRAVENOUS

## 2020-03-13 MED ORDER — ONDANSETRON HCL 4 MG PO TABS: 4.0000 mg | ORAL_TABLET | Freq: Four times a day (QID) | ORAL | Status: DC | PRN

## 2020-03-13 MED ORDER — ONDANSETRON HCL 4 MG/2ML IJ SOLN
INTRAMUSCULAR | Status: AC
  Filled 2020-03-13: qty 4

## 2020-03-13 MED ORDER — SODIUM CHLORIDE 0.9 % IV SOLN
Freq: Once | INTRAVENOUS | Status: DC
  Filled 2020-03-13: qty 250

## 2020-03-13 MED ORDER — CIPROFLOXACIN IN D5W 400 MG/200ML IV SOLN
400.0000 mg | Freq: Two times a day (BID) | INTRAVENOUS | Status: DC
  Administered 2020-03-13 – 2020-03-16 (×6): 400 mg via INTRAVENOUS
  Filled 2020-03-13 (×6): qty 200

## 2020-03-13 MED ORDER — INSULIN ASPART 100 UNIT/ML ~~LOC~~ SOLN: 0.0000 [IU] | Freq: Every day | SUBCUTANEOUS | Status: DC

## 2020-03-13 MED ORDER — LOPERAMIDE HCL 1 MG/7.5ML PO SUSP
2.0000 mg | ORAL | Status: DC | PRN
  Filled 2020-03-13: qty 15

## 2020-03-13 MED ORDER — MORPHINE SULFATE (PF) 2 MG/ML IV SOLN
INTRAVENOUS | Status: AC
  Filled 2020-03-13: qty 1

## 2020-03-13 MED ORDER — ONDANSETRON HCL 4 MG/2ML IJ SOLN
8.0000 mg | Freq: Once | INTRAMUSCULAR | Status: AC
  Administered 2020-03-13: 8 mg via INTRAVENOUS

## 2020-03-13 MED ORDER — LOPERAMIDE HCL 2 MG PO CAPS
2.0000 mg | ORAL_CAPSULE | ORAL | Status: DC | PRN
  Administered 2020-03-13 – 2020-03-20 (×23): 2 mg via ORAL
  Filled 2020-03-13 (×24): qty 1

## 2020-03-13 MED ORDER — SODIUM CHLORIDE 0.9 % IV SOLN: 8.0000 mg | Freq: Once | INTRAVENOUS | Status: DC

## 2020-03-13 MED ORDER — ACETAMINOPHEN 325 MG PO TABS
650.0000 mg | ORAL_TABLET | Freq: Four times a day (QID) | ORAL | Status: DC | PRN
  Administered 2020-03-19: 650 mg via ORAL
  Filled 2020-03-13: qty 2

## 2020-03-13 MED ORDER — POTASSIUM CHLORIDE 10 MEQ/100ML IV SOLN
10.0000 meq | INTRAVENOUS | Status: AC
  Administered 2020-03-13 (×4): 10 meq via INTRAVENOUS
  Filled 2020-03-13 (×4): qty 100

## 2020-03-13 MED ORDER — ROSUVASTATIN CALCIUM 5 MG PO TABS
5.0000 mg | ORAL_TABLET | Freq: Every evening | ORAL | Status: DC
  Administered 2020-03-13 – 2020-03-19 (×7): 5 mg via ORAL
  Filled 2020-03-13 (×7): qty 1

## 2020-03-13 MED ORDER — SODIUM CHLORIDE 0.9 % IV BOLUS
500.0000 mL | Freq: Once | INTRAVENOUS | Status: AC
  Administered 2020-03-13: 500 mL via INTRAVENOUS

## 2020-03-13 MED ORDER — ENOXAPARIN SODIUM 40 MG/0.4ML ~~LOC~~ SOLN
40.0000 mg | SUBCUTANEOUS | Status: DC
  Administered 2020-03-13 – 2020-03-17 (×4): 40 mg via SUBCUTANEOUS
  Filled 2020-03-13 (×7): qty 0.4

## 2020-03-13 MED ORDER — MORPHINE SULFATE 2 MG/ML IJ SOLN
2.0000 mg | Freq: Once | INTRAMUSCULAR | Status: AC
  Administered 2020-03-13: 2 mg via INTRAVENOUS
  Filled 2020-03-13: qty 1

## 2020-03-13 MED ORDER — IOHEXOL 9 MG/ML PO SOLN
500.0000 mL | ORAL | Status: AC
  Administered 2020-03-13: 500 mL via ORAL

## 2020-03-13 MED ORDER — OXYCODONE HCL 5 MG PO TABS
5.0000 mg | ORAL_TABLET | Freq: Four times a day (QID) | ORAL | Status: DC | PRN
  Administered 2020-03-13: 5 mg via ORAL
  Filled 2020-03-13 (×2): qty 1

## 2020-03-13 MED ORDER — SODIUM CHLORIDE 0.9 % IV SOLN
INTRAVENOUS | Status: DC
  Administered 2020-03-18: 100 mL via INTRAVENOUS

## 2020-03-13 MED ORDER — INSULIN ASPART 100 UNIT/ML ~~LOC~~ SOLN
0.0000 [IU] | Freq: Three times a day (TID) | SUBCUTANEOUS | Status: DC
  Administered 2020-03-13: 3 [IU] via SUBCUTANEOUS
  Administered 2020-03-14 – 2020-03-20 (×2): 2 [IU] via SUBCUTANEOUS

## 2020-03-13 MED ORDER — ACETAMINOPHEN 650 MG RE SUPP: 650.0000 mg | Freq: Four times a day (QID) | RECTAL | Status: DC | PRN

## 2020-03-13 MED ORDER — VANCOMYCIN HCL 750 MG/150ML IV SOLN
750.0000 mg | Freq: Two times a day (BID) | INTRAVENOUS | Status: DC
  Administered 2020-03-14 – 2020-03-16 (×6): 750 mg via INTRAVENOUS
  Filled 2020-03-13 (×6): qty 150

## 2020-03-13 MED ORDER — FLUOXETINE HCL 20 MG PO CAPS
20.0000 mg | ORAL_CAPSULE | Freq: Every morning | ORAL | Status: DC
  Administered 2020-03-14 – 2020-03-20 (×7): 20 mg via ORAL
  Filled 2020-03-13 (×7): qty 1

## 2020-03-13 MED ORDER — IOHEXOL 9 MG/ML PO SOLN
ORAL | Status: AC
  Administered 2020-03-13: 500 mL via ORAL
  Filled 2020-03-13: qty 1000

## 2020-03-13 MED ORDER — CLORAZEPATE DIPOTASSIUM 3.75 MG PO TABS
7.5000 mg | ORAL_TABLET | Freq: Two times a day (BID) | ORAL | Status: DC
  Administered 2020-03-13 – 2020-03-20 (×14): 7.5 mg via ORAL
  Filled 2020-03-13 (×14): qty 2

## 2020-03-13 MED ORDER — ONDANSETRON HCL 4 MG/2ML IJ SOLN: 4.0000 mg | Freq: Four times a day (QID) | INTRAMUSCULAR | Status: DC | PRN

## 2020-03-13 MED ORDER — SODIUM CHLORIDE 0.9 % IV SOLN
Freq: Once | INTRAVENOUS | Status: AC
  Filled 2020-03-13: qty 10

## 2020-03-13 MED ORDER — MORPHINE SULFATE (PF) 2 MG/ML IV SOLN: 2.0000 mg | Freq: Once | INTRAVENOUS | Status: DC

## 2020-03-13 MED ORDER — IOHEXOL 300 MG/ML  SOLN
100.0000 mL | Freq: Once | INTRAMUSCULAR | Status: AC | PRN
  Administered 2020-03-13: 100 mL via INTRAVENOUS

## 2020-03-13 MED ORDER — FENTANYL CITRATE (PF) 100 MCG/2ML IJ SOLN: 12.5000 ug | INTRAMUSCULAR | Status: DC | PRN

## 2020-03-13 NOTE — Patient Instructions (Signed)
Feel better!

## 2020-03-13 NOTE — Progress Notes (Signed)
This RN administered 8mg  iv zofran, 2mg  iv morphine, and 8mg  iv decadron per MD orders. See MAR for administration details.

## 2020-03-13 NOTE — Telephone Encounter (Signed)
I spoke with the patient and at this point in time she is really unsure about resuming radiotherapy. She is having symptoms of a UTI, diarrhea, and pain and itching in the perineal area. She was counseled on the use of silvadene prn, but since she is really interested in ending treatment, she can use silvadene more regularly TID. She is planning to follow up with Dr. Burr Medico later this week and is possibly coming in for IVF as well. She will let me know if something changes, and while we would like to complete treatment as she only has 4 fractions left, she doesn't feel she is able. I did explain that the effectiveness of treatment could be limited without completing treatment. We will discontinue her treatment plan unless she contacts [place] by Monday.     Carola Rhine, PAC

## 2020-03-13 NOTE — H&P (Signed)
History and Physical    Melinda [name] VOJ:[id number] DOB: 1960-12-26 DOA: 03/13/2020  PCP: Crist Infante, MD  Patient coming from: [place]  Chief Complaint: N/V/ab pain  HPI: Melinda [name] is a 59 y.o. female with medical history significant of recurrent anal cancer. Presents with 1 week of N/V/ab pain. She notes that she had her last chemo/radiation treatments on Friday (03/07/20). Since that day, she had had increasing N/V/ab and groin pain. She followed up with her oncologist on 10/4. At that time, there was concern for a UTI. She was given macrobid. She was unable to tolerate that medication. So she returned to the clinic today w/ the above complains plus a concern for fever. It was recommended that she come to the hospital. With respect to her ab pain, she states that it's global, but worse in the RLQ. It's sharp and intermittent when it happens. With respect to her groin pain, she notes that it's a burning sensation with urination and bowel movement. With respect to her N/V; she is unable to tolerate solids and medications. Liquids have been increasing difficult to keep down. She denies any other aggravating or alleviating factors. She denies any other treatments.   Review of Systems:  Review of systems is otherwise negative for all not mentioned in HPI.   PMHx Past Medical History:  Diagnosis Date  . Diabetes mellitus type 2, controlled ([place])   . Dyslipidemia    untreated  . GERD (gastroesophageal reflux disease)   . Hemorrhoids   . Microcytic anemia 06/22/2012  . Nephrolith   . Palpitation    positive tilt table test. Prozac and Inderal treatment effective.    PSHx Past Surgical History:  Procedure Laterality Date  . APPENDECTOMY    . BREAST REDUCTION SURGERY    . CARDIAC CATHETERIZATION  2002   normal LV function and no significant coronary obstruction  . CATARACT EXTRACTION, BILATERAL Bilateral november 2020 and dcember 2020  . CYST EXCISION N/A 01/11/2020    Procedure: EXCISION OF ANAL CANAL MASS;  Surgeon: Ileana Roup, MD;  Location: [facility];  Service: General;  Laterality: N/A;  . ESOPHAGEAL MANOMETRY N/A 04/04/2017   Procedure: ESOPHAGEAL MANOMETRY (EM);  Surgeon: Laurence Spates, MD;  Location: [facility];  Service: Endoscopy;  Laterality: N/A;  . HEMORRHOID SURGERY N/A 07/13/2019   Procedure: HEMORRHOIDECTOMY;  Surgeon: Johnathan Hausen, MD;  Location: [facility];  Service: General;  Laterality: N/A;  . KIDNEY SURGERY     kidney stones   . PARTIAL HYSTERECTOMY    . RECTAL EXAM UNDER ANESTHESIA N/A 01/11/2020   Procedure: RECTAL EXAM UNDER ANESTHESIA;  Surgeon: Ileana Roup, MD;  Location: [facility];  Service: General;  Laterality: N/A;  . TONSILLECTOMY      SocHx  reports that she quit smoking about 2 years ago. Her smoking use included e-cigarettes. She has never used smokeless tobacco. She reports that she does not drink alcohol and does not use drugs.  Allergies  Allergen Reactions  . Cephalexin Anaphylaxis and Other (See Comments)    Kef tabs only (maybe dye) Capsules-anaphylxis  . Amoxapine And Related   . Amoxicillin-Pot Clavulanate Other (See Comments)  . Atorvastatin Other (See Comments)    intolernace  . Cefaclor Other (See Comments)  . Ciprofloxacin Nausea And Vomiting  . Codeine Hives  . Doxycycline Calcium Nausea And Vomiting  . Fenofibrate Micronized Other (See Comments)    Cramping  . Fish Oil Other (See Comments)  . Flagyl [Metronidazole  Hcl]   . Levofloxacin In D5w Other (See Comments)    Unknown   . Metronidazole Nausea And Vomiting  . Ofloxacin Other (See Comments)  . Prednisolone Other (See Comments)    Anxiety, palpitations  . Promethazine Hcl Other (See Comments)  . Tetracycline Hcl Other (See Comments)  . Erythromycin Rash  . Nitrofurantoin Itching, Nausea And Vomiting and Rash    FamHx Family History  Problem Relation Age of Onset  . Heart disease Mother   . Liver  disease Mother   . Heart disease Father   . Heart attack Father   . Cancer Father        prostate CA  . Cancer Maternal Grandmother 60       ovarian cancer  . Coronary artery disease Other        Fx of  . Heart attack Other   . Diabetes Other     Prior to Admission medications   Medication Sig Start Date End Date Taking? Authorizing Provider  clorazepate (TRANXENE) 7.5 MG tablet Take 7.5 mg by mouth 2 (two) times daily. 02/12/18   [provider]  CRESTOR 5 MG tablet Take 5 mg by mouth every evening.  08/10/14   [provider]  diphenoxylate-atropine (LOMOTIL) 2.5-0.025 MG tablet Take 1-2 tablets by mouth 4 (four) times daily as needed for diarrhea or loose stools. Patient not taking: Reported on 02/25/2020 02/15/20   Truitt Merle, MD  esomeprazole (NEXIUM) 40 MG capsule Take 40 mg by mouth daily at 12 noon.    [provider]  FLUoxetine (PROZAC) 20 MG capsule Take 20 mg by mouth every morning. 10/23/19   [provider]  hyoscyamine (ANASPAZ) 0.125 MG TBDP disintergrating tablet Place 1 tablet (0.125 mg total) under the tongue every 6 (six) hours as needed. 02/21/20   Tanner, Lyndon Code., PA-C  ibuprofen (ADVIL) 800 MG tablet Take 1 tablet (800 mg total) by mouth every 8 (eight) hours as needed. Patient not taking: Reported on 01/10/2020 07/13/19   Johnathan Hausen, MD  Loperamide HCl (IMODIUM PO) Take 1 capsule by mouth every 6 (six) hours as needed.    [provider]  LORazepam (ATIVAN) 0.5 MG tablet Take 1 tablet (0.5 mg total) by mouth every 8 (eight) hours. 03/12/20   Truitt Merle, MD  metFORMIN (GLUCOPHAGE) 500 MG tablet Take by mouth 2 (two) times daily with a meal.    [provider]  methenamine (HIPREX) 1 g tablet Take 0.5 g by mouth daily.     [provider]  nitrofurantoin, macrocrystal-monohydrate, (MACROBID) 100 MG capsule Take 1 capsule (100 mg total) by mouth 2 (two) times daily. 03/12/20   Truitt Merle, MD  NU-IRON 150 MG capsule  Take 150 mg by mouth daily.  02/02/17   [provider]  ondansetron (ZOFRAN) 8 MG tablet Take 1 tablet (8 mg total) by mouth 2 (two) times daily as needed (Nausea or vomiting). 01/17/20   Truitt Merle, MD  oxyCODONE (OXY IR/ROXICODONE) 5 MG immediate release tablet Take 1 tablet (5 mg total) by mouth every 4 (four) hours as needed for severe pain. 03/10/20   Truitt Merle, MD  polyvinyl alcohol (LIQUIFILM TEARS) 1.4 % ophthalmic solution Place 1 drop into both eyes as needed for dry eyes.    [provider]  prochlorperazine (COMPAZINE) 10 MG tablet Take 1 tablet (10 mg total) by mouth every 6 (six) hours as needed (Nausea or vomiting). 01/17/20   Truitt Merle, MD  propranolol (INDERAL) 10  MG tablet TAKE 1 TABLET BY MOUTH TWICE A DAY Patient taking differently: Take 10 mg by mouth 2 (two) times daily.  07/11/19   Fay Records, MD  silver sulfADIAZINE (SILVADENE) 1 % cream Apply 1 application topically daily. 02/21/20   Tanner, Lyndon Code., PA-C  sulfamethoxazole-trimethoprim (BACTRIM) 400-80 MG tablet Take 2 tablets by mouth 2 (two) times daily. 02/18/20   Truitt Merle, MD    Physical Exam: There were no vitals filed for this visit.  General: 59 y.o. female resting in bed in NAD Eyes: PERRL, normal sclera ENMT: Nares patent w/o discharge, orophaynx clear, dentition normal, ears w/o discharge/lesions/ulcers Neck: Supple, trachea midline Cardiovascular: RRR, +S1, S2, no m/g/r, equal pulses throughout Respiratory: CTABL, no w/r/r, normal WOB GI: BS+, ND, RLL TTP, no masses noted, no organomegaly noted MSK: No e/c/c GU: excoriations c/w radiation burns inside labia, similar excoriations in and around her anus, external hemorrhoid noted Skin: No bruises, ulcerations noted. Neuro: A&O x 3, no focal deficits Psyc: Appropriate interaction and affect, calm/cooperative  Labs on Admission: I have personally reviewed following labs and imaging studies  CBC: Recent Labs  Lab 03/10/20 1253 03/13/20 1123   WBC 2.2* 2.6*  NEUTROABS 1.7 1.8  HGB 11.8* 10.8*  HCT 36.2 32.7*  MCV 80.6 79.2*  PLT 107* 287*   Basic Metabolic Panel: Recent Labs  Lab 03/10/20 1253 03/13/20 1123  NA 138 136  K 3.4* 3.0*  CL 103 103  CO2 27 26  GLUCOSE 157* 153*  BUN 17 13  CREATININE 0.85 0.76  CALCIUM 9.6 9.0   GFR: Estimated Creatinine Clearance: 69.1 mL/min (by C-G formula based on SCr of 0.76 mg/dL). Liver Function Tests: Recent Labs  Lab 03/10/20 1253 03/13/20 1123  AST 10* 9*  ALT 12 10  ALKPHOS 53 54  BILITOT 0.4 0.5  PROT 6.9 6.2*  ALBUMIN 3.2* 2.8*   No results for input(s): LIPASE, AMYLASE in the last 168 hours. No results for input(s): AMMONIA in the last 168 hours. Coagulation Profile: No results for input(s): INR, PROTIME in the last 168 hours. Cardiac Enzymes: No results for input(s): CKTOTAL, CKMB, CKMBINDEX, TROPONINI in the last 168 hours. BNP (last 3 results) No results for input(s): PROBNP in the last 8760 hours. HbA1C: No results for input(s): HGBA1C in the last 72 hours. CBG: No results for input(s): GLUCAP in the last 168 hours. Lipid Profile: No results for input(s): CHOL, HDL, LDLCALC, TRIG, CHOLHDL, LDLDIRECT in the last 72 hours. Thyroid Function Tests: No results for input(s): TSH, T4TOTAL, FREET4, T3FREE, THYROIDAB in the last 72 hours. Anemia Panel: No results for input(s): VITAMINB12, FOLATE, FERRITIN, TIBC, IRON, RETICCTPCT in the last 72 hours. Urine analysis:    Component Value Date/Time   COLORURINE RED (A) 03/10/2020 1506   APPEARANCEUR HAZY (A) 03/10/2020 1506   LABSPEC 1.024 03/10/2020 1506   PHURINE 5.0 03/10/2020 1506   GLUCOSEU 50 (A) 03/10/2020 1506   GLUCOSEU NEGATIVE 01/16/2013 0903   HGBUR MODERATE (A) 03/10/2020 1506   BILIRUBINUR NEGATIVE 03/10/2020 1506   KETONESUR NEGATIVE 03/10/2020 1506   PROTEINUR 100 (A) 03/10/2020 1506   UROBILINOGEN 0.2 01/16/2013 0903   NITRITE POSITIVE (A) 03/10/2020 1506   LEUKOCYTESUR NEGATIVE  03/10/2020 1506    Radiological Exams on Admission: No results found.  Assessment/Plan Intractable N/V     - admit to inpatient, med-surg     - check imaging     - PRN anti-emetics     - fluids     -  NPO except sips w/ meds through tonight, try CLD starting tomorrow  UTI     - UCx from 10/4 shows e coli and MRSA.     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 Burr Medico     - spoke with Dr Burr Medico about imaging; reasonable to get CT ab/pelvis, will order     - PRN fentanyl w/ BP hold parameters  Hx of a fib     - on propranolol     - hold for now d/t BP; follow  Excoriations of the anus and labia     - ?radiation burns? Clean w/o drainage.     - will consult [facility]  Hypokalemia     - K+ is 3.0     - received 54mEq K+ in clinic     - check Mg2+     - give another 28mEq IV, follow  Pancytopenia     - secondary to chemo/radiation     - No evidence of bleed, follow  HLD     - crestor  DM2     - hold home metformin     - SSI, glucose checks  Diarrhea     - continue imodium  DVT prophylaxis: lovenox  Code Status: FULL  Family Communication: With husband at bedside  Consults called: Spoke with oncology (Dr. Burr Medico)  Admission status: Inpatient d/t inability to take PO.   Status is: Inpatient  Remains inpatient appropriate because:Inpatient level of care appropriate due to severity of illness   Dispo: The patient is from: Home              Anticipated d/c is to: Home              Anticipated d/c date is: 2 days              Patient currently is not medically stable to d/c.  Time spent coordinating admission: 90 minutes  [facility]  If 7PM-7AM, please contact night-coverage [email or web address]  03/13/2020, 2:54 PM

## 2020-03-13 NOTE — Progress Notes (Signed)
Patient was a direct admit from the office. Patient has been settled into the room in no immediate distress. Awaiting admitting orders from her MD. Will continue to monitor.

## 2020-03-13 NOTE — Progress Notes (Signed)
Pharmacy Antibiotic Note  Melinda [name] is a 59 y.o. female admitted on 03/13/2020 with MRSA & pansensitive E coli UTI.  Pharmacy has been consulted for vancomycin dosing.  Plan:  Vancomycin 750 mg IV q12 hr  Don't anticipate being on vanc long enough to need TDM  SCr q48 while on vanc  Cipro per MD; dosing appropriate  Patient tolerates Bactrim (with some nausea) - consider narrowing to Bactrim once ready for PO and ensuring antiemetics ordered     Temp (24hrs), Avg:98.9 F (37.2 C), Min:98.9 F (37.2 C), Max:98.9 F (37.2 C)  Recent Labs  Lab 03/10/20 1253 03/13/20 1123  WBC 2.2* 2.6*  CREATININE 0.85 0.76    Estimated Creatinine Clearance: 69.1 mL/min (by C-G formula based on SCr of 0.76 mg/dL).    Allergies  Allergen Reactions  . Cephalexin Anaphylaxis and Other (See Comments)    Kef tabs only (maybe dye) Capsules-anaphylxis  . Amoxapine And Related   . Amoxicillin-Pot Clavulanate Other (See Comments)  . Atorvastatin Other (See Comments)    intolernace  . Cefaclor Other (See Comments)  . Ciprofloxacin Nausea And Vomiting  . Codeine Hives  . Doxycycline Calcium Nausea And Vomiting  . Fenofibrate Micronized Other (See Comments)    Cramping  . Fish Oil Other (See Comments)  . Flagyl [Metronidazole Hcl]   . Levofloxacin In D5w Other (See Comments)    Unknown   . Metronidazole Nausea And Vomiting  . Ofloxacin Other (See Comments)  . Prednisolone Other (See Comments)    Anxiety, palpitations  . Promethazine Hcl Other (See Comments)  . Tape Other (See Comments)    Rash and blisters, only plastic tape  . Tetracycline Hcl Other (See Comments)  . Erythromycin Rash  . Nitrofurantoin Itching, Nausea And Vomiting and Rash    Antimicrobials this admission: 10/7 vanc >>  10/7 cipro >>   Dose adjustments this admission:  Microbiology results: 10/4 BCx (x1): ngtd 10/4 UCx: 10k E coli (pansens); >100k MRSA (R-cipro)   Thank you for allowing pharmacy to be a  part of this patient's care.  Cattie Tineo A 03/13/2020 4:39 PM

## 2020-03-14 ENCOUNTER — Ambulatory Visit: Payer: BC Managed Care – PPO

## 2020-03-14 DIAGNOSIS — R112 Nausea with vomiting, unspecified: Secondary | ICD-10-CM

## 2020-03-14 LAB — COMPREHENSIVE METABOLIC PANEL
ALT: 10 U/L (ref 0–44)
AST: 9 U/L — ABNORMAL LOW (ref 15–41)
Albumin: 2.7 g/dL — ABNORMAL LOW (ref 3.5–5.0)
Alkaline Phosphatase: 41 U/L (ref 38–126)
Anion gap: 7 (ref 5–15)
BUN: 10 mg/dL (ref 8–23)
CO2: 24 mmol/L (ref 22–32)
Calcium: 8.3 mg/dL — ABNORMAL LOW (ref 8.9–10.3)
Chloride: 107 mmol/L (ref 98–111)
Creatinine, Ser: 0.57 mg/dL (ref 0.44–1.00)
GFR calc non Af Amer: >60 mL/min (ref >60)
Glucose, Bld: 139 mg/dL — ABNORMAL HIGH (ref 70–99)
Potassium: 3.5 mmol/L (ref 3.5–5.1)
Sodium: 138 mmol/L (ref 135–145)
Total Bilirubin: 0.5 mg/dL (ref 0.3–1.2)
Total Protein: 5.5 g/dL — ABNORMAL LOW (ref 6.5–8.1)

## 2020-03-14 LAB — CBC
HCT: 29.6 % — ABNORMAL LOW (ref 36.0–46.0)
Hemoglobin: 9.5 g/dL — ABNORMAL LOW (ref 12.0–15.0)
MCH: 26.7 pg (ref 26.0–34.0)
MCHC: 32.1 g/dL (ref 30.0–36.0)
MCV: 83.1 fL (ref 80.0–100.0)
Platelets: 129 10*3/uL — ABNORMAL LOW (ref 150–400)
RBC: 3.56 MIL/uL — ABNORMAL LOW (ref 3.87–5.11)
RDW: 16.4 % — ABNORMAL HIGH (ref 11.5–15.5)
WBC: 1.6 10*3/uL — ABNORMAL LOW (ref 4.0–10.5)
nRBC: 0 % (ref 0.0–0.2)

## 2020-03-14 LAB — GLUCOSE, CAPILLARY
Glucose-Capillary: 123 mg/dL — ABNORMAL HIGH (ref 70–99)
Glucose-Capillary: 89 mg/dL (ref 70–99)
Glucose-Capillary: 109 mg/dL — ABNORMAL HIGH (ref 70–99)
Glucose-Capillary: 85 mg/dL (ref 70–99)

## 2020-03-14 MED ORDER — POTASSIUM CHLORIDE CRYS ER 20 MEQ PO TBCR
40.0000 meq | EXTENDED_RELEASE_TABLET | Freq: Once | ORAL | Status: AC
  Administered 2020-03-14: 40 meq via ORAL
  Filled 2020-03-14: qty 2

## 2020-03-14 MED ORDER — MAGNESIUM SULFATE 4 GM/100ML IV SOLN
4.0000 g | Freq: Once | INTRAVENOUS | Status: AC
  Administered 2020-03-14: 4 g via INTRAVENOUS
  Filled 2020-03-14: qty 100

## 2020-03-14 MED ORDER — PROPRANOLOL HCL 10 MG PO TABS
10.0000 mg | ORAL_TABLET | Freq: Two times a day (BID) | ORAL | Status: DC
  Administered 2020-03-14 – 2020-03-16 (×4): 10 mg via ORAL
  Filled 2020-03-14 (×4): qty 1

## 2020-03-14 MED ORDER — POTASSIUM CHLORIDE 10 MEQ/100ML IV SOLN
10.0000 meq | INTRAVENOUS | Status: DC
  Administered 2020-03-14: 10 meq via INTRAVENOUS
  Filled 2020-03-14 (×2): qty 100

## 2020-03-14 MED ORDER — ZINC OXIDE 40 % EX OINT
TOPICAL_OINTMENT | Freq: Three times a day (TID) | CUTANEOUS | Status: AC
  Filled 2020-03-14 (×2): qty 57

## 2020-03-14 NOTE — Progress Notes (Signed)
Initial Nutrition Assessment  INTERVENTION:   -Recommend weight measurement for admission  Once diet advanced: -Boost Breeze po TID, each supplement provides 250 kcal and 9 grams of protein  NUTRITION DIAGNOSIS:   Increased nutrient needs related to cancer and cancer related treatments as evidenced by estimated needs.  GOAL:   Patient will meet greater than or equal to 90% of their needs  MONITOR:   PO intake, Supplement acceptance, Weight trends, Labs, I & O's  REASON FOR ASSESSMENT:   Malnutrition Screening Tool    ASSESSMENT:   59 y.o. female with medical history significant of recurrent anal cancer. Presents with 1 week of N/V/ab pain. She notes that she had her last chemo/radiation treatments on Friday (03/07/20). Since that day, she had had increasing N/V/ab and groin pain. She followed up with her oncologist on 10/4. At that time, there was concern for a UTI.  Patient currently NPO. Pt was having N/V and abdominal pain PTA for ~1 week, pt was unable to tolerate solid food during this time. This followed her chemotherapy/radiation treatment.   Once diet is able to be advanced, recommend Boost Breeze supplements for additional kcals and protein.  Pt reports weight loss of 15 lbs since 8/30. Weight has not been measured for this admission to verify weight changes. Last weight recorded on 9/30 (156 lbs).  Medications: IV Mg sulfate, IV KCl Labs reviewed: CBGs: 123-161 Low Mg  NUTRITION - FOCUSED PHYSICAL EXAM:  Unable to complete, will attempt at follow-up  Diet Order:   Diet Order            Diet NPO time specified Except for: Sips with Meds  Diet effective now                 EDUCATION NEEDS:   Not appropriate for education at this time  Skin:  Skin Assessment: Reviewed RN Assessment  Last BM:  10/6  Height:   Ht Readings from Last 1 Encounters:  03/13/20 5\' 6"  (1.676 m)    Weight:   Wt Readings from Last 1 Encounters:  03/06/20 71.1 kg     BMI:  Body mass index is 25.31 kg/m.  Estimated Nutritional Needs:   Kcal:  2100-2300  Protein:  100-115g  Fluid:  2.1L/day  Clayton Bibles, MS, RD, LDN Inpatient Clinical Dietitian Contact information available via Amion

## 2020-03-14 NOTE — Progress Notes (Addendum)
Melinda [name]   DOB:1961-03-08   AO#:[id number]   ONG#:[id number]  Oncology follow up  Subjective: Patient is well-known to me, under our care for annual cancer.  She was admitted from my office for severe abdominal pain, nausea, vomiting and UTI.  She is overall feeling better today pain controlled.  She is glad that she came into the hospital.  Objective:  Vitals:   03/13/20 2340 03/14/20 0327  BP: (!) 87/53 133/62  Pulse: 75 69  Resp: 16 16  Temp: 98.3 F (36.8 C) 97.8 F (36.6 C)  SpO2: 95% 97%    Body mass index is 25.31 kg/m.  Intake/Output Summary (Last 24 hours) at 03/14/2020 1415 Last data filed at 03/14/2020 1155 Gross per 24 hour  Intake 1031.74 ml  Output 1200 ml  Net -168.26 ml     Sclerae unicteric  Oropharynx clear  No peripheral adenopathy  Lungs clear -- no rales or rhonchi  Heart regular rate and rhythm  Abdomen soft, diffuse tenderness   CBG (last 3)  Recent Labs    03/13/20 2123 03/14/20 0741 03/14/20 1154  GLUCAP 161* 123* 89     Labs:   Urine Studies No results for input(s): UHGB, CRYS in the last 72 hours.  Invalid input(s): UACOL, UAPR, USPG, UPH, UTP, UGL, UKET, UBIL, UNIT, UROB, [place], UEPI, UWBC, Junie Panning [place], [place], [place]  Basic Metabolic Panel: Recent Labs  Lab 03/10/20 1253 03/10/20 1253 03/13/20 1123 03/13/20 1123 03/13/20 1742 03/14/20 0523  NA 138  --  136  --  137 138  K 3.4*   < > 3.0*   < > 3.4* 3.5  CL 103  --  103  --  106 107  CO2 27  --  26  --  23 24  GLUCOSE 157*  --  153*  --  174* 139*  BUN 17  --  13  --  13 10  CREATININE 0.85  --  0.76  --  0.59 0.57  CALCIUM 9.6  --  9.0  --  8.1* 8.3*  MG  --   --   --   --  1.6*  --    < > = values in this interval not displayed.   GFR Estimated Creatinine Clearance: 69.1 mL/min (by C-G formula based on SCr of 0.57 mg/dL). Liver Function Tests: Recent Labs  Lab 03/10/20 1253 03/13/20 1123 03/13/20 1742 03/14/20 0523  AST 10* 9* 11* 9*  ALT 12 10 11 10    ALKPHOS 53 54 45 41  BILITOT 0.4 0.5 0.4 0.5  PROT 6.9 6.2* 5.7* 5.5*  ALBUMIN 3.2* 2.8* 2.9* 2.7*   No results for input(s): LIPASE, AMYLASE in the last 168 hours. No results for input(s): AMMONIA in the last 168 hours. Coagulation profile No results for input(s): INR, PROTIME in the last 168 hours.  CBC: Recent Labs  Lab 03/10/20 1253 03/13/20 1123 03/13/20 1742 03/14/20 0523  WBC 2.2* 2.6* 1.6* 1.6*  NEUTROABS 1.7 1.8  --   --   HGB 11.8* 10.8* 10.0* 9.5*  HCT 36.2 32.7* 31.6* 29.6*  MCV 80.6 79.2* 82.7 83.1  PLT 107* 128* 132* 129*   Cardiac Enzymes: No results for input(s): CKTOTAL, CKMB, CKMBINDEX, TROPONINI in the last 168 hours. BNP: Invalid input(s): POCBNP CBG: Recent Labs  Lab 03/13/20 1642 03/13/20 2123 03/14/20 0741 03/14/20 1154  GLUCAP 169* 161* 123* 89   D-Dimer No results for input(s): DDIMER in the last 72 hours. Hgb A1c No results for input(s):  HGBA1C in the last 72 hours. Lipid Profile No results for input(s): CHOL, HDL, LDLCALC, TRIG, CHOLHDL, LDLDIRECT in the last 72 hours. Thyroid function studies No results for input(s): TSH, T4TOTAL, T3FREE, THYROIDAB in the last 72 hours.  Invalid input(s): FREET3 Anemia work up No results for input(s): VITAMINB12, FOLATE, FERRITIN, TIBC, IRON, RETICCTPCT in the last 72 hours. Microbiology Recent Results (from the past 240 hour(s))  Culture, Urine     Status: Abnormal   Collection Time: 03/10/20  3:06 PM   Specimen: Urine, Clean Catch  Result Value Ref Range Status   Specimen Description   Final    URINE, CLEAN CATCH Performed at [facility], [address], [place], [place] [address]    Special Requests   Final    NONE Performed at [facility], [place] [address], [place], [place] [address]    Culture (A)  Final    10,000 COLONIES/mL ESCHERICHIA COLI >=100,000 COLONIES/mL METHICILLIN RESISTANT STAPHYLOCOCCUS AUREUS    Report Status  03/12/2020 FINAL  Final   Organism ID, Bacteria ESCHERICHIA COLI (A)  Final   Organism ID, Bacteria METHICILLIN RESISTANT STAPHYLOCOCCUS AUREUS (A)  Final      Susceptibility   Escherichia coli - MIC*    AMPICILLIN <=2 SENSITIVE Sensitive     CEFAZOLIN <=4 SENSITIVE Sensitive     CEFTRIAXONE <=0.25 SENSITIVE Sensitive     CIPROFLOXACIN <=0.25 SENSITIVE Sensitive     GENTAMICIN <=1 SENSITIVE Sensitive     IMIPENEM <=0.25 SENSITIVE Sensitive     NITROFURANTOIN <=16 SENSITIVE Sensitive     TRIMETH/SULFA <=20 SENSITIVE Sensitive     AMPICILLIN/SULBACTAM <=2 SENSITIVE Sensitive     PIP/TAZO <=4 SENSITIVE Sensitive     * 10,000 COLONIES/mL ESCHERICHIA COLI   Methicillin resistant staphylococcus aureus - MIC*    CIPROFLOXACIN >=8 RESISTANT Resistant     GENTAMICIN <=0.5 SENSITIVE Sensitive     NITROFURANTOIN <=16 SENSITIVE Sensitive     OXACILLIN >=4 RESISTANT Resistant     TETRACYCLINE <=1 SENSITIVE Sensitive     VANCOMYCIN 1 SENSITIVE Sensitive     TRIMETH/SULFA <=10 SENSITIVE Sensitive     CLINDAMYCIN <=0.25 SENSITIVE Sensitive     RIFAMPIN <=0.5 SENSITIVE Sensitive     Inducible Clindamycin NEGATIVE Sensitive     * >=100,000 COLONIES/mL METHICILLIN RESISTANT STAPHYLOCOCCUS AUREUS  Culture, blood (routine x 2)     Status: None (Preliminary result)   Collection Time: 03/10/20  3:54 PM   Specimen: BLOOD  Result Value Ref Range Status   Specimen Description   Final    BLOOD LEFT ARM Performed at [facility], [address] [place], [place] [address]    Special Requests   Final    BOTTLES DRAWN AEROBIC AND ANAEROBIC Blood Culture adequate volume   Culture   Final    NO GROWTH 4 DAYS Performed at [facility], [place] [address], [place], [place] [address]    Report Status PENDING  Incomplete      Studies:  CT ABDOMEN PELVIS W CONTRAST  Result Date: 03/13/2020 CLINICAL DATA:  Abdominal pain and fever, history of squamous cell EXAM: CT ABDOMEN AND  PELVIS WITH CONTRAST TECHNIQUE: Multidetector CT imaging of the abdomen and pelvis was performed using the standard protocol following bolus administration of intravenous contrast. CONTRAST:  115mL OMNIPAQUE IOHEXOL 300 MG/ML  SOLN COMPARISON:  September 04, 2019 FINDINGS: Lower chest: The visualized heart size within normal limits. There is a trace pericardial effusion.  No hiatal hernia. The visualized portions of the lungs are clear. Hepatobiliary: There is diffuse low density seen throughout the liver parenchyma. No focal hepatic lesion is seen. No intra or extrahepatic biliary ductal dilatation. The main portal vein is patent. No evidence of calcified gallstones, gallbladder wall thickening or biliary dilatation. Pancreas: Unremarkable. No pancreatic ductal dilatation or surrounding inflammatory changes. Spleen: Normal in size without focal abnormality. Adrenals/Urinary Tract: Both adrenal glands appear normal. There is a punctate calcifications seen in the upper pole of the right kidney. There is areas of scarring seen within the upper pole. A 1 cm low-density lesion seen in the lower pole the right kidney. No left-sided renal or collecting system calculi. There is mild diffuse bladder wall thickening. Stomach/Bowel: The stomach and proximal small bowel are unremarkable. There is a long segment of ileum with diffuse wall thickening surrounding fat stranding changes to the terminal ileum. There is also wall thickening seen around the distal cecal pole. Mild wall thickening with surrounding fat stranding changes are seen around the sigmoid colon extending to the sigmoid rectal junction. No loculated fluid collections or free air however are noted. Vascular/Lymphatic: There are no enlarged mesenteric, retroperitoneal, or pelvic lymph nodes. Scattered aortic atherosclerotic calcifications are seen without aneurysmal dilatation. Reproductive: The patient is status post hysterectomy. No adnexal masses or collections  seen. Other: A small amount of free fluid is seen within the deep cul-de-sac. Musculoskeletal: No acute or significant osseous findings. IMPRESSION: Diffuse wall thickening with inflammatory changes involving a long segment of distal ileum, cecum, and sigmoid colon likely represents enteritis/colitis. This could be due to infectious, or inflammatory nature, not thought to represent ischemic colitis. Hepatic steatosis Trace pericardial effusion Small free fluid in the deep pelvis Electronically Signed   By: Prudencio Pair M.D.   On: 03/13/2020 21:30    Assessment: 59 y.o. female   1.  enteritis from chemo and radiation  2.  Abdominal pain, nausea, and vomiting from #1 and chemo 3.  E. coli UTI 4.  Perianal dermatitis from radiation 5.  Pancytopenia secondary to chemotherapy 6.  Dehydration and malnutrition, improved 7.  DM 8. Depression   Plan -I reviewed her CT scan from yesterday, which showed a diffuse enteritis involving small intestine and colon which is likely from chemo and radiation -If she still has loose bowel movements, will be reasonable to check GI panel to rule out infectious etiology -Continue supportive care, I appreciate the excellent care from hospitalist team -please check WBC diff tomorrow, may consider Granix if ANC<500  -I will be back to office next Tuesday, will f/u closely after discharge    Truitt Merle, MD 03/14/2020  2:15 PM

## 2020-03-14 NOTE — Progress Notes (Signed)
PROGRESS NOTE    Melinda [name]  TSV:[id number] DOB: 09/19/1960 DOA: 03/13/2020 PCP: Crist Infante, MD    Brief Narrative:  59 y.o. female with medical history significant of recurrent anal cancer. Presents with 1 week of N/V/ab pain. She notes that she had her last chemo/radiation treatments on Friday (03/07/20). Since that day, she had had increasing N/V/ab and groin pain. She followed up with her oncologist on 10/4. At that time, there was concern for a UTI. She was given macrobid. She was unable to tolerate that medication. So she returned to the clinic today w/ the above complains plus a concern for fever. It was recommended that she come to the hospital. With respect to her ab pain, she states that it's global, but worse in the RLQ. It's sharp and intermittent when it happens. With respect to her groin pain, she notes that it's a burning sensation with urination and bowel movement. With respect to her N/V; she is unable to tolerate solids and medications. Liquids have been increasing difficult to keep down. She denies any other aggravating or alleviating factors. She denies any other treatments  Assessment & Plan:   Active Problems:   Intractable nausea and vomiting  Intractable N/V with abd pain - CT abd reviewed. Findings notable for diffuse wall thickening with inflammatory changes involving a long segment of distal ileum, cecum, and sigmoid colon which likely represents enteritis/colitis. Suspect radiation induced  -Continue PRN anti-emetics -continue with basal IVF fluids as tolerated -This AM, pt continuing to feel abd pain on mild palpation but is eager to start diet soon -Will give trial of ice chips. Plan to advance diet as tolerated  UTI  - UCx from 10/4 shows e coli and MRSA.  - she has an extensive allergy list. 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 Burr Medico. Recommendation to continue supportive care. Consider Granix if ANC<500  Hx of a fib -Home propranolol was initially held secondary to soft BP -BP now improved. Will resume with hold parameters  Excoriations of the anus and labia - Likely secondary radiation treatment -Appreciate WOC input  Hypokalemia     - K+ is up to 3.5 this AM     - Magnesium low at 1.6. -Pt to receive 47meq additional KCl -Will replace Mg -Repeat lytes in AM  Pancytopenia - secondary to chemo/radiation - No evidence of bleed -repeat CBC in AM  HLD - on Crestor prior to admit -Would resume when pt is able to reliably tolerate PO  DM2 - hold home metformin while in hospital - Continue SSI coverage  Diarrhea - continue imodium as needed   DVT prophylaxis: Lovenox subq Code Status: Full Family Communication: Pt in room, family not at bedside  Status is: Inpatient  Remains inpatient appropriate because:IV treatments appropriate due to intensity of illness or inability to take PO   Dispo: The patient is from: Home              Anticipated d/c is to: Home              Anticipated d/c date is: 2 days              Patient currently is not medically stable to d/c.       Consultants:   Oncology  Procedures:     Antimicrobials: Anti-infectives (From  admission, onward)   Start     Dose/Rate Route Frequency Ordered Stop   03/13/20 2200  vancomycin (VANCOREADY) IVPB 750 mg/150 mL        750 mg 150 mL/hr over 60 Minutes Intravenous 2 times daily 03/13/20 1639     03/13/20 1700  ciprofloxacin (CIPRO) IVPB 400 mg        400 mg 200 mL/hr over 60 Minutes Intravenous Every 12 hours 03/13/20 1538     03/13/20 1700  vancomycin (VANCOREADY) IVPB 750 mg/150 mL        750 mg 150 mL/hr over 60 Minutes Intravenous  Once 03/13/20 1639 03/13/20 1958       Subjective: Still with abd discomfort this AM  Objective: Vitals:    03/13/20 2300 03/13/20 2340 03/14/20 0327 03/14/20 1440  BP:  (!) 87/53 133/62 129/70  Pulse:  75 69 69  Resp:  16 16 16   Temp:  98.3 F (36.8 C) 97.8 F (36.6 C) 98.2 F (36.8 C)  TempSrc:  Oral Oral Oral  SpO2:  95% 97% 97%  Height: 5\' 6"  (1.676 m)       Intake/Output Summary (Last 24 hours) at 03/14/2020 1657 Last data filed at 03/14/2020 1441 Gross per 24 hour  Intake 1031.74 ml  Output 1800 ml  Net -768.26 ml   There were no vitals filed for this visit.  Examination:  General exam: Appears calm and comfortable  Respiratory system: Clear to auscultation. Respiratory effort normal. Cardiovascular system: S1 & S2 heard, Regular Gastrointestinal system: Pos BS, abd tenderness over RLQ into hypogastric region Central nervous system: Alert and oriented. No focal neurological deficits. Extremities: Symmetric 5 x 5 power. Skin: No rashes, lesions or ulcers Psychiatry: Judgement and insight appear normal. Mood & affect appropriate.   Data Reviewed: I have personally reviewed following labs and imaging studies  CBC: Recent Labs  Lab 03/10/20 1253 03/13/20 1123 03/13/20 1742 03/14/20 0523  WBC 2.2* 2.6* 1.6* 1.6*  NEUTROABS 1.7 1.8  --   --   HGB 11.8* 10.8* 10.0* 9.5*  HCT 36.2 32.7* 31.6* 29.6*  MCV 80.6 79.2* 82.7 83.1  PLT 107* 128* 132* 416*   Basic Metabolic Panel: Recent Labs  Lab 03/10/20 1253 03/13/20 1123 03/13/20 1742 03/14/20 0523  NA 138 136 137 138  K 3.4* 3.0* 3.4* 3.5  CL 103 103 106 107  CO2 27 26 23 24   GLUCOSE 157* 153* 174* 139*  BUN 17 13 13 10   CREATININE 0.85 0.76 0.59 0.57  CALCIUM 9.6 9.0 8.1* 8.3*  MG  --   --  1.6*  --    GFR: Estimated Creatinine Clearance: 69.1 mL/min (by C-G formula based on SCr of 0.57 mg/dL). Liver Function Tests: Recent Labs  Lab 03/10/20 1253 03/13/20 1123 03/13/20 1742 03/14/20 0523  AST 10* 9* 11* 9*  ALT 12 10 11 10   ALKPHOS 53 54 45 41  BILITOT 0.4 0.5 0.4 0.5  PROT 6.9 6.2* 5.7* 5.5*    ALBUMIN 3.2* 2.8* 2.9* 2.7*   No results for input(s): LIPASE, AMYLASE in the last 168 hours. No results for input(s): AMMONIA in the last 168 hours. Coagulation Profile: No results for input(s): INR, PROTIME in the last 168 hours. Cardiac Enzymes: No results for input(s): CKTOTAL, CKMB, CKMBINDEX, TROPONINI in the last 168 hours. BNP (last 3 results) No results for input(s): PROBNP in the last 8760 hours. HbA1C: No results for input(s): HGBA1C in the last 72 hours. CBG: Recent Labs  Lab 03/13/20  1642 03/13/20 2123 03/14/20 0741 03/14/20 1154  GLUCAP 169* 161* 123* 89   Lipid Profile: No results for input(s): CHOL, HDL, LDLCALC, TRIG, CHOLHDL, LDLDIRECT in the last 72 hours. Thyroid Function Tests: No results for input(s): TSH, T4TOTAL, FREET4, T3FREE, THYROIDAB in the last 72 hours. Anemia Panel: No results for input(s): VITAMINB12, FOLATE, FERRITIN, TIBC, IRON, RETICCTPCT in the last 72 hours. Sepsis Labs: No results for input(s): PROCALCITON, LATICACIDVEN in the last 168 hours.  Recent Results (from the past 240 hour(s))  Culture, Urine     Status: Abnormal   Collection Time: 03/10/20  3:06 PM   Specimen: Urine, Clean Catch  Result Value Ref Range Status   Specimen Description   Final    URINE, CLEAN CATCH Performed at [facility], [address], [place], [place] [address]    Special Requests   Final    NONE Performed at [facility], [place] [address] [place], [place] [address]    Culture (A)  Final    10,000 COLONIES/mL ESCHERICHIA COLI >=100,000 COLONIES/mL METHICILLIN RESISTANT STAPHYLOCOCCUS AUREUS    Report Status 03/12/2020 FINAL  Final   Organism ID, Bacteria ESCHERICHIA COLI (A)  Final   Organism ID, Bacteria METHICILLIN RESISTANT STAPHYLOCOCCUS AUREUS (A)  Final      Susceptibility   Escherichia coli - MIC*    AMPICILLIN <=2 SENSITIVE Sensitive     CEFAZOLIN <=4 SENSITIVE Sensitive     CEFTRIAXONE  <=0.25 SENSITIVE Sensitive     CIPROFLOXACIN <=0.25 SENSITIVE Sensitive     GENTAMICIN <=1 SENSITIVE Sensitive     IMIPENEM <=0.25 SENSITIVE Sensitive     NITROFURANTOIN <=16 SENSITIVE Sensitive     TRIMETH/SULFA <=20 SENSITIVE Sensitive     AMPICILLIN/SULBACTAM <=2 SENSITIVE Sensitive     PIP/TAZO <=4 SENSITIVE Sensitive     * 10,000 COLONIES/mL ESCHERICHIA COLI   Methicillin resistant staphylococcus aureus - MIC*    CIPROFLOXACIN >=8 RESISTANT Resistant     GENTAMICIN <=0.5 SENSITIVE Sensitive     NITROFURANTOIN <=16 SENSITIVE Sensitive     OXACILLIN >=4 RESISTANT Resistant     TETRACYCLINE <=1 SENSITIVE Sensitive     VANCOMYCIN 1 SENSITIVE Sensitive     TRIMETH/SULFA <=10 SENSITIVE Sensitive     CLINDAMYCIN <=0.25 SENSITIVE Sensitive     RIFAMPIN <=0.5 SENSITIVE Sensitive     Inducible Clindamycin NEGATIVE Sensitive     * >=100,000 COLONIES/mL METHICILLIN RESISTANT STAPHYLOCOCCUS AUREUS  Culture, blood (routine x 2)     Status: None (Preliminary result)   Collection Time: 03/10/20  3:54 PM   Specimen: BLOOD  Result Value Ref Range Status   Specimen Description   Final    BLOOD LEFT ARM Performed at [facility], [address], [place], [place] [address]    Special Requests   Final    BOTTLES DRAWN AEROBIC AND ANAEROBIC Blood Culture adequate volume   Culture   Final    NO GROWTH 4 DAYS Performed at [facility], [place] [address] [place], [place] [address]    Report Status PENDING  Incomplete     Radiology Studies: CT ABDOMEN PELVIS W CONTRAST  Result Date: 03/13/2020 CLINICAL DATA:  Abdominal pain and fever, history of squamous cell EXAM: CT ABDOMEN AND PELVIS WITH CONTRAST TECHNIQUE: Multidetector CT imaging of the abdomen and pelvis was performed using the standard protocol following bolus administration of intravenous contrast. CONTRAST:  142mL OMNIPAQUE IOHEXOL 300 MG/ML  SOLN COMPARISON:  September 04, 2019  FINDINGS: Lower chest: The  visualized heart size within normal limits. There is a trace pericardial effusion. No hiatal hernia. The visualized portions of the lungs are clear. Hepatobiliary: There is diffuse low density seen throughout the liver parenchyma. No focal hepatic lesion is seen. No intra or extrahepatic biliary ductal dilatation. The main portal vein is patent. No evidence of calcified gallstones, gallbladder wall thickening or biliary dilatation. Pancreas: Unremarkable. No pancreatic ductal dilatation or surrounding inflammatory changes. Spleen: Normal in size without focal abnormality. Adrenals/Urinary Tract: Both adrenal glands appear normal. There is a punctate calcifications seen in the upper pole of the right kidney. There is areas of scarring seen within the upper pole. A 1 cm low-density lesion seen in the lower pole the right kidney. No left-sided renal or collecting system calculi. There is mild diffuse bladder wall thickening. Stomach/Bowel: The stomach and proximal small bowel are unremarkable. There is a long segment of ileum with diffuse wall thickening surrounding fat stranding changes to the terminal ileum. There is also wall thickening seen around the distal cecal pole. Mild wall thickening with surrounding fat stranding changes are seen around the sigmoid colon extending to the sigmoid rectal junction. No loculated fluid collections or free air however are noted. Vascular/Lymphatic: There are no enlarged mesenteric, retroperitoneal, or pelvic lymph nodes. Scattered aortic atherosclerotic calcifications are seen without aneurysmal dilatation. Reproductive: The patient is status post hysterectomy. No adnexal masses or collections seen. Other: A small amount of free fluid is seen within the deep cul-de-sac. Musculoskeletal: No acute or significant osseous findings. IMPRESSION: Diffuse wall thickening with inflammatory changes involving a long segment of distal ileum, cecum, and sigmoid colon likely represents  enteritis/colitis. This could be due to infectious, or inflammatory nature, not thought to represent ischemic colitis. Hepatic steatosis Trace pericardial effusion Small free fluid in the deep pelvis Electronically Signed   By: Prudencio Pair M.D.   On: 03/13/2020 21:30    Scheduled Meds: . clorazepate  7.5 mg Oral BID  . enoxaparin (LOVENOX) injection  40 mg Subcutaneous Q24H  . FLUoxetine  20 mg Oral q morning - 10a  . insulin aspart  0-15 Units Subcutaneous TID WC  . insulin aspart  0-5 Units Subcutaneous QHS  . liver oil-zinc oxide   Topical TID  . rosuvastatin  5 mg Oral QPM   Continuous Infusions: . sodium chloride 100 mL/hr at 03/14/20 1129  . ciprofloxacin 400 mg (03/14/20 0509)  . vancomycin 750 mg (03/14/20 0947)     LOS: 1 day   Marylu Lund, MD [facility] Pager On Amion  If 7PM-7AM, please contact night-coverage 03/14/2020, 4:57 PM

## 2020-03-14 NOTE — Consult Note (Signed)
WOC Nurse Consult Note: Reason for Consult: Erythema, edema and pinpoint partial thickness lesions in the perineal (labial, perirectal) areas-moist cell desquamation. Wound type: thermal injury Pressure Injury POA: NA Measurement:scattered partial thickness lesions in the above mentioned area Wound EOF:HQRF, moist Drainage (amount, consistency, odor) scant serous (no bleeding) Periwound:dry, cracking Dressing procedure/placement/frequency: I will provide a mattress replacement with low air loss feature and topical Desitin ointment three times daily to the area. Patient has declined to continue radiation therapy. She is appreciative of the mattress replacement and of the DermaTherapy linens. Patient is provided information as to where she can purchase DT linens post discharge.  [place] nursing team will not follow, but will remain available to this patient, the nursing and medical teams.  Please re-consult if needed. Thanks, Maudie Flakes, MSN, RN, [place], [facility]  Pager# [phone]

## 2020-03-15 LAB — COMPREHENSIVE METABOLIC PANEL
ALT: 12 U/L (ref 0–44)
AST: 15 U/L (ref 15–41)
Albumin: 2.8 g/dL — ABNORMAL LOW (ref 3.5–5.0)
Alkaline Phosphatase: 42 U/L (ref 38–126)
Anion gap: 8 (ref 5–15)
BUN: 10 mg/dL (ref 8–23)
CO2: 24 mmol/L (ref 22–32)
Calcium: 8.4 mg/dL — ABNORMAL LOW (ref 8.9–10.3)
Chloride: 107 mmol/L (ref 98–111)
Creatinine, Ser: 0.7 mg/dL (ref 0.44–1.00)
GFR, Estimated: >60 mL/min (ref >60)
Glucose, Bld: 114 mg/dL — ABNORMAL HIGH (ref 70–99)
Potassium: 3.5 mmol/L (ref 3.5–5.1)
Sodium: 139 mmol/L (ref 135–145)
Total Bilirubin: 0.6 mg/dL (ref 0.3–1.2)
Total Protein: 5.6 g/dL — ABNORMAL LOW (ref 6.5–8.1)

## 2020-03-15 LAB — MAGNESIUM: Magnesium: 2.3 mg/dL (ref 1.7–2.4)

## 2020-03-15 LAB — CBC WITH DIFFERENTIAL/PLATELET
Abs Immature Granulocytes: 0.03 10*3/uL (ref 0.00–0.07)
Basophils Absolute: 0 10*3/uL (ref 0.0–0.1)
Basophils Relative: 1 %
Eosinophils Absolute: 0.1 10*3/uL (ref 0.0–0.5)
Eosinophils Relative: 4 %
HCT: 32 % — ABNORMAL LOW (ref 36.0–46.0)
Hemoglobin: 10.1 g/dL — ABNORMAL LOW (ref 12.0–15.0)
Immature Granulocytes: 2 %
Lymphocytes Relative: 14 %
Lymphs Abs: 0.2 10*3/uL — ABNORMAL LOW (ref 0.7–4.0)
MCH: 26.8 pg (ref 26.0–34.0)
MCHC: 31.6 g/dL (ref 30.0–36.0)
MCV: 84.9 fL (ref 80.0–100.0)
Monocytes Absolute: 0.3 10*3/uL (ref 0.1–1.0)
Monocytes Relative: 18 %
Neutro Abs: 1 10*3/uL — ABNORMAL LOW (ref 1.7–7.7)
Neutrophils Relative %: 61 %
Platelets: 123 10*3/uL — ABNORMAL LOW (ref 150–400)
RBC: 3.77 MIL/uL — ABNORMAL LOW (ref 3.87–5.11)
RDW: 16.9 % — ABNORMAL HIGH (ref 11.5–15.5)
WBC: 1.6 10*3/uL — ABNORMAL LOW (ref 4.0–10.5)
nRBC: 0 % (ref 0.0–0.2)

## 2020-03-15 LAB — GLUCOSE, CAPILLARY
Glucose-Capillary: 99 mg/dL (ref 70–99)
Glucose-Capillary: 85 mg/dL (ref 70–99)
Glucose-Capillary: 127 mg/dL — ABNORMAL HIGH (ref 70–99)
Glucose-Capillary: 91 mg/dL (ref 70–99)

## 2020-03-15 LAB — CULTURE, BLOOD (ROUTINE X 2)
Culture: NO GROWTH
Special Requests: ADEQUATE

## 2020-03-15 NOTE — Progress Notes (Signed)
PROGRESS NOTE    Melinda [name]  HER:[id number] DOB: [birth year] DOA: 03/13/2020 PCP: Crist Infante, MD    Brief Narrative:  59 y.o. female with medical history significant of recurrent anal cancer. Presents with 1 week of N/V/ab pain. She notes that she had her last chemo/radiation treatments on Friday (03/07/20). Since that day, she had had increasing N/V/ab and groin pain. She followed up with her oncologist on 10/4. At that time, there was concern for a UTI. She was given macrobid. She was unable to tolerate that medication. So she returned to the clinic today w/ the above complains plus a concern for fever. It was recommended that she come to the hospital. With respect to her ab pain, she states that it's global, but worse in the RLQ. It's sharp and intermittent when it happens. With respect to her groin pain, she notes that it's a burning sensation with urination and bowel movement. With respect to her N/V; she is unable to tolerate solids and medications. Liquids have been increasing difficult to keep down. She denies any other aggravating or alleviating factors. She denies any other treatments  Assessment & Plan:   Active Problems:   Intractable nausea and vomiting  Intractable N/V with abd pain - CT abd reviewed. Findings notable for diffuse wall thickening with inflammatory changes involving a long segment of distal ileum, cecum, and sigmoid colon which likely represents enteritis/colitis. Suspect radiation induced  -Continue PRN anti-emetics -continued with basal IVF fluids as tolerated -Today pain seems to be improved -Tolerating clears, will advance to trial of full liquids  UTI  - UCx from 10/4 shows e coli and MRSA.  - she has an extensive allergy list. 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 Burr Medico.  Recommendation to continue supportive care. Consider Granix if ANC<500 -Repeat CBC in AM, ANC just over 970 today  Hx of a fib -Home propranolol was initially held secondary to soft BP -BP now improved after resumption of propranolol  Excoriations of the anus and labia - Likely secondary radiation treatment -Appreciate WOC input  Hypokalemia     - K+currently 3.5     - Magnesium corrected -repeat bmet in AM  Pancytopenia - secondary to chemo/radiation - No evidence of bleed -remains stable thus far -Repeat cbc w/ diff in aM  HLD - on Crestor prior to admit -Have resumed crestor  DM2 - hold home metformin while in hospital - Continue SSI coverage  Diarrhea - continue imodium as needed -Staff reports diarrhea has improved as of this AM   DVT prophylaxis: Lovenox subq Code Status: Full Family Communication: Pt in room, family not at bedside  Status is: Inpatient  Remains inpatient appropriate because:IV treatments appropriate due to intensity of illness or inability to take PO   Dispo: The patient is from: Home              Anticipated d/c is to: Home              Anticipated d/c date is: 2 days              Patient currently is not medically stable to d/c.   Consultants:   Oncology  Procedures:     Antimicrobials: Anti-infectives (From admission, onward)   Start     Dose/Rate Route Frequency Ordered Stop   03/13/20 2200  vancomycin (VANCOREADY) IVPB 750 mg/150 mL        750 mg 150 mL/hr over 60 Minutes Intravenous 2 times daily 03/13/20 1639     03/13/20 1700  ciprofloxacin (CIPRO) IVPB 400 mg        400 mg 200 mL/hr over 60 Minutes Intravenous Every 12 hours 03/13/20 1538     03/13/20 1700  vancomycin (VANCOREADY) IVPB 750 mg/150 mL        750 mg 150 mL/hr over 60 Minutes Intravenous  Once 03/13/20 1639 03/13/20 1958      Subjective: Tolerating clears. Feels somewhat better  Objective: Vitals:   03/14/20 0327 03/14/20 1440 03/14/20  2221 03/15/20 0606  BP: 133/62 129/70 119/72 123/71  Pulse: 69 69 79 76  Resp: 16 16 17 16   Temp: 97.8 F (36.6 C) 98.2 F (36.8 C) 98.4 F (36.9 C) 98.2 F (36.8 C)  TempSrc: Oral Oral Oral Oral  SpO2: 97% 97% 99% 95%  Weight:    71.7 kg  Height:        Intake/Output Summary (Last 24 hours) at 03/15/2020 1421 Last data filed at 03/15/2020 1100 Gross per 24 hour  Intake 3551.24 ml  Output 1650 ml  Net 1901.24 ml   Filed Weights   03/15/20 0606  Weight: 71.7 kg    Examination: General exam: Awake, laying in bed, in nad Respiratory system: Normal respiratory effort, no wheezing Cardiovascular system: regular rate, s1, s2 Gastrointestinal system: Soft, mild tenderness over hypogastric and RLQ regions Central nervous system: CN2-12 grossly intact, strength intact Extremities: Perfused, no clubbing Skin: Normal skin turgor, no notable skin lesions seen Psychiatry: Mood normal // no visual hallucinations   Data Reviewed: I have personally reviewed following labs and imaging studies  CBC: Recent Labs  Lab 03/10/20 1253 03/13/20 1123 03/13/20 1742 03/14/20 0523 03/15/20 0657  WBC 2.2* 2.6* 1.6* 1.6* 1.6*  NEUTROABS 1.7 1.8  --   --  1.0*  HGB 11.8* 10.8* 10.0* 9.5* 10.1*  HCT 36.2 32.7* 31.6* 29.6* 32.0*  MCV 80.6 79.2* 82.7 83.1 84.9  PLT 107* 128* 132* 129* 229*   Basic Metabolic Panel: Recent Labs  Lab 03/10/20 1253 03/13/20 1123 03/13/20 1742 03/14/20 0523 03/15/20 0657  NA 138 136 137 138 139  K 3.4* 3.0* 3.4* 3.5 3.5  CL 103 103 106 107 107  CO2 27 26 23 24 24   GLUCOSE 157* 153* 174* 139* 114*  BUN 17 13 13 10 10   CREATININE 0.85 0.76 0.59 0.57 0.70  CALCIUM 9.6 9.0 8.1* 8.3* 8.4*  MG  --   --  1.6*  --  2.3   GFR: Estimated Creatinine Clearance: 75 mL/min (by C-G formula based on SCr of 0.7 mg/dL). Liver Function Tests: Recent Labs  Lab 03/10/20 1253 03/13/20 1123 03/13/20 1742 03/14/20 0523 03/15/20 0657  AST 10* 9* 11* 9* 15  ALT 12 10  11 10 12   ALKPHOS 53 54 45 41 42  BILITOT 0.4 0.5 0.4 0.5 0.6  PROT 6.9 6.2* 5.7* 5.5* 5.6*  ALBUMIN 3.2* 2.8* 2.9* 2.7* 2.8*   No results for input(s): LIPASE, AMYLASE in the last 168 hours. No results for input(s): AMMONIA in the last 168 hours. Coagulation Profile: No results for input(s): INR, PROTIME in the last 168 hours. Cardiac Enzymes: No results for input(s): CKTOTAL, CKMB, CKMBINDEX, TROPONINI in the last 168 hours. BNP (last 3 results) No results for input(s): PROBNP in the last 8760 hours. HbA1C: No results for input(s): HGBA1C in the last  72 hours. CBG: Recent Labs  Lab 03/14/20 1154 03/14/20 1632 03/14/20 2222 03/15/20 0740 03/15/20 1144  GLUCAP 89 109* 85 99 85   Lipid Profile: No results for input(s): CHOL, HDL, LDLCALC, TRIG, CHOLHDL, LDLDIRECT in the last 72 hours. Thyroid Function Tests: No results for input(s): TSH, T4TOTAL, FREET4, T3FREE, THYROIDAB in the last 72 hours. Anemia Panel: No results for input(s): VITAMINB12, FOLATE, FERRITIN, TIBC, IRON, RETICCTPCT in the last 72 hours. Sepsis Labs: No results for input(s): PROCALCITON, LATICACIDVEN in the last 168 hours.  Recent Results (from the past 240 hour(s))  Culture, Urine     Status: Abnormal   Collection Time: 03/10/20  3:06 PM   Specimen: Urine, Clean Catch  Result Value Ref Range Status   Specimen Description   Final    URINE, CLEAN CATCH Performed at [facility], [address] [place], [place] [address]    Special Requests   Final    NONE Performed at [facility], [place] [address], [place], [place] [address]    Culture (A)  Final    10,000 COLONIES/mL ESCHERICHIA COLI >=100,000 COLONIES/mL METHICILLIN RESISTANT STAPHYLOCOCCUS AUREUS    Report Status 03/12/2020 FINAL  Final   Organism ID, Bacteria ESCHERICHIA COLI (A)  Final   Organism ID, Bacteria METHICILLIN RESISTANT STAPHYLOCOCCUS AUREUS (A)  Final      Susceptibility    Escherichia coli - MIC*    AMPICILLIN <=2 SENSITIVE Sensitive     CEFAZOLIN <=4 SENSITIVE Sensitive     CEFTRIAXONE <=0.25 SENSITIVE Sensitive     CIPROFLOXACIN <=0.25 SENSITIVE Sensitive     GENTAMICIN <=1 SENSITIVE Sensitive     IMIPENEM <=0.25 SENSITIVE Sensitive     NITROFURANTOIN <=16 SENSITIVE Sensitive     TRIMETH/SULFA <=20 SENSITIVE Sensitive     AMPICILLIN/SULBACTAM <=2 SENSITIVE Sensitive     PIP/TAZO <=4 SENSITIVE Sensitive     * 10,000 COLONIES/mL ESCHERICHIA COLI   Methicillin resistant staphylococcus aureus - MIC*    CIPROFLOXACIN >=8 RESISTANT Resistant     GENTAMICIN <=0.5 SENSITIVE Sensitive     NITROFURANTOIN <=16 SENSITIVE Sensitive     OXACILLIN >=4 RESISTANT Resistant     TETRACYCLINE <=1 SENSITIVE Sensitive     VANCOMYCIN 1 SENSITIVE Sensitive     TRIMETH/SULFA <=10 SENSITIVE Sensitive     CLINDAMYCIN <=0.25 SENSITIVE Sensitive     RIFAMPIN <=0.5 SENSITIVE Sensitive     Inducible Clindamycin NEGATIVE Sensitive     * >=100,000 COLONIES/mL METHICILLIN RESISTANT STAPHYLOCOCCUS AUREUS  Culture, blood (routine x 2)     Status: None   Collection Time: 03/10/20  3:54 PM   Specimen: BLOOD  Result Value Ref Range Status   Specimen Description   Final    BLOOD LEFT ARM Performed at [facility], [place] [address] [place], [place] [address]    Special Requests   Final    BOTTLES DRAWN AEROBIC AND ANAEROBIC Blood Culture adequate volume   Culture   Final    NO GROWTH 5 DAYS Performed at [facility], [place] [address], [place], [place] [address]    Report Status 03/15/2020 FINAL  Final     Radiology Studies: CT ABDOMEN PELVIS W CONTRAST  Result Date: 03/13/2020 CLINICAL DATA:  Abdominal pain and fever, history of squamous cell EXAM: CT ABDOMEN AND PELVIS WITH CONTRAST TECHNIQUE: Multidetector CT imaging of the abdomen and pelvis was performed using the standard protocol following bolus administration of intravenous contrast. CONTRAST:   1104mL  OMNIPAQUE IOHEXOL 300 MG/ML  SOLN COMPARISON:  September 04, 2019 FINDINGS: Lower chest: The visualized heart size within normal limits. There is a trace pericardial effusion. No hiatal hernia. The visualized portions of the lungs are clear. Hepatobiliary: There is diffuse low density seen throughout the liver parenchyma. No focal hepatic lesion is seen. No intra or extrahepatic biliary ductal dilatation. The main portal vein is patent. No evidence of calcified gallstones, gallbladder wall thickening or biliary dilatation. Pancreas: Unremarkable. No pancreatic ductal dilatation or surrounding inflammatory changes. Spleen: Normal in size without focal abnormality. Adrenals/Urinary Tract: Both adrenal glands appear normal. There is a punctate calcifications seen in the upper pole of the right kidney. There is areas of scarring seen within the upper pole. A 1 cm low-density lesion seen in the lower pole the right kidney. No left-sided renal or collecting system calculi. There is mild diffuse bladder wall thickening. Stomach/Bowel: The stomach and proximal small bowel are unremarkable. There is a long segment of ileum with diffuse wall thickening surrounding fat stranding changes to the terminal ileum. There is also wall thickening seen around the distal cecal pole. Mild wall thickening with surrounding fat stranding changes are seen around the sigmoid colon extending to the sigmoid rectal junction. No loculated fluid collections or free air however are noted. Vascular/Lymphatic: There are no enlarged mesenteric, retroperitoneal, or pelvic lymph nodes. Scattered aortic atherosclerotic calcifications are seen without aneurysmal dilatation. Reproductive: The patient is status post hysterectomy. No adnexal masses or collections seen. Other: A small amount of free fluid is seen within the deep cul-de-sac. Musculoskeletal: No acute or significant osseous findings. IMPRESSION: Diffuse wall thickening with inflammatory  changes involving a long segment of distal ileum, cecum, and sigmoid colon likely represents enteritis/colitis. This could be due to infectious, or inflammatory nature, not thought to represent ischemic colitis. Hepatic steatosis Trace pericardial effusion Small free fluid in the deep pelvis Electronically Signed   By: Prudencio Pair M.D.   On: 03/13/2020 21:30    Scheduled Meds: . clorazepate  7.5 mg Oral BID  . enoxaparin (LOVENOX) injection  40 mg Subcutaneous Q24H  . FLUoxetine  20 mg Oral q morning - 10a  . insulin aspart  0-15 Units Subcutaneous TID WC  . insulin aspart  0-5 Units Subcutaneous QHS  . liver oil-zinc oxide   Topical TID  . propranolol  10 mg Oral BID  . rosuvastatin  5 mg Oral QPM   Continuous Infusions: . sodium chloride 100 mL/hr at 03/15/20 1032  . ciprofloxacin 400 mg (03/15/20 0606)  . vancomycin 750 mg (03/15/20 1034)     LOS: 2 days   Marylu Lund, MD [facility] Pager On Amion  If 7PM-7AM, please contact night-coverage 03/15/2020, 2:21 PM

## 2020-03-16 LAB — CBC WITH DIFFERENTIAL/PLATELET
Abs Immature Granulocytes: 0.03 10*3/uL (ref 0.00–0.07)
Band Neutrophils: 0 %
Basophils Absolute: 0 10*3/uL (ref 0.0–0.1)
Basophils Relative: 1 %
Blasts: 0 %
Eosinophils Absolute: 0 10*3/uL (ref 0.0–0.5)
Eosinophils Relative: 3 %
HCT: 31.4 % — ABNORMAL LOW (ref 36.0–46.0)
Hemoglobin: 9.9 g/dL — ABNORMAL LOW (ref 12.0–15.0)
Lymphocytes Relative: 11 %
Lymphs Abs: 0.2 10*3/uL — ABNORMAL LOW (ref 0.7–4.0)
MCH: 26.8 pg (ref 26.0–34.0)
MCHC: 31.5 g/dL (ref 30.0–36.0)
MCV: 84.9 fL (ref 80.0–100.0)
Metamyelocytes Relative: 0 %
Monocytes Absolute: 0.3 10*3/uL (ref 0.1–1.0)
Monocytes Relative: 19 %
Myelocytes: 2 %
Neutro Abs: 0.9 10*3/uL — ABNORMAL LOW (ref 1.7–7.7)
Neutrophils Relative %: 64 %
Other: 0 %
Platelets: 122 10*3/uL — ABNORMAL LOW (ref 150–400)
Promyelocytes Relative: 0 %
RBC: 3.7 MIL/uL — ABNORMAL LOW (ref 3.87–5.11)
RDW: 16.7 % — ABNORMAL HIGH (ref 11.5–15.5)
WBC: 1.4 10*3/uL — CL (ref 4.0–10.5)
nRBC: 0 % (ref 0.0–0.2)
nRBC: 0 /100 WBC

## 2020-03-16 LAB — COMPREHENSIVE METABOLIC PANEL
ALT: 11 U/L (ref 0–44)
AST: 12 U/L — ABNORMAL LOW (ref 15–41)
Albumin: 2.7 g/dL — ABNORMAL LOW (ref 3.5–5.0)
Alkaline Phosphatase: 40 U/L (ref 38–126)
Anion gap: 9 (ref 5–15)
BUN: 7 mg/dL — ABNORMAL LOW (ref 8–23)
CO2: 22 mmol/L (ref 22–32)
Calcium: 8.4 mg/dL — ABNORMAL LOW (ref 8.9–10.3)
Chloride: 109 mmol/L (ref 98–111)
Creatinine, Ser: 0.66 mg/dL (ref 0.44–1.00)
GFR, Estimated: >60 mL/min (ref >60)
Glucose, Bld: 124 mg/dL — ABNORMAL HIGH (ref 70–99)
Potassium: 3.4 mmol/L — ABNORMAL LOW (ref 3.5–5.1)
Sodium: 140 mmol/L (ref 135–145)
Total Bilirubin: 0.7 mg/dL (ref 0.3–1.2)
Total Protein: 5.2 g/dL — ABNORMAL LOW (ref 6.5–8.1)

## 2020-03-16 LAB — GLUCOSE, CAPILLARY
Glucose-Capillary: 117 mg/dL — ABNORMAL HIGH (ref 70–99)
Glucose-Capillary: 92 mg/dL (ref 70–99)
Glucose-Capillary: 93 mg/dL (ref 70–99)
Glucose-Capillary: 94 mg/dL (ref 70–99)

## 2020-03-16 LAB — RESPIRATORY PANEL BY RT PCR (FLU A&B, COVID)
Influenza A by PCR: NEGATIVE
Influenza B by PCR: NEGATIVE
SARS Coronavirus 2 by RT PCR: NEGATIVE

## 2020-03-16 MED ORDER — POTASSIUM CHLORIDE CRYS ER 20 MEQ PO TBCR
60.0000 meq | EXTENDED_RELEASE_TABLET | Freq: Once | ORAL | Status: AC
  Administered 2020-03-16: 60 meq via ORAL
  Filled 2020-03-16: qty 3

## 2020-03-16 MED ORDER — CHOLESTYRAMINE 4 G PO PACK
4.0000 g | PACK | Freq: Two times a day (BID) | ORAL | Status: DC | PRN
  Filled 2020-03-16: qty 1

## 2020-03-16 MED ORDER — SULFAMETHOXAZOLE-TRIMETHOPRIM 800-160 MG PO TABS
1.0000 | ORAL_TABLET | Freq: Two times a day (BID) | ORAL | Status: DC
  Administered 2020-03-16 – 2020-03-18 (×6): 1 via ORAL
  Filled 2020-03-16 (×6): qty 1

## 2020-03-16 NOTE — Progress Notes (Signed)
Pharmacy Antibiotic Note  Melinda [name] is a 59 y.o. female admitted on 03/13/2020 with MRSA & pansensitive E coli UTI.  Pharmacy has been consulted for vancomycin dosing.  Day 3 full abx, Afebrile, WBC 2.6 >> 1.4, ANC 900, SCr wnl and unchanged  Plan:  Vancomycin 750 mg IV q12 hr  Don't anticipate being on vanc long enough to need TDM  SCr q48 while on vanc  Cipro per MD; dosing appropriate  Patient tolerates Bactrim (with some nausea) - consider narrowing to Bactrim once ready for PO and ensuring antiemetics ordered  Height: 5\' 6"  (167.6 cm) Weight: 71.7 kg (158 lb 1.1 oz) IBW/kg (Calculated) : 59.3  Temp (24hrs), Avg:98.3 F (36.8 C), Min:98.2 F (36.8 C), Max:98.6 F (37 C)  Recent Labs  Lab 03/13/20 1123 03/13/20 1742 03/14/20 0523 03/15/20 0657 03/16/20 0704  WBC 2.6* 1.6* 1.6* 1.6* 1.4*  CREATININE 0.76 0.59 0.57 0.70 0.66    Estimated Creatinine Clearance: 75 mL/min (by C-G formula based on SCr of 0.66 mg/dL).    Allergies  Allergen Reactions  . Cephalexin Anaphylaxis and Other (See Comments)    Kef tabs only (maybe dye) Capsules-anaphylxis  . Amoxapine And Related   . Amoxicillin-Pot Clavulanate Other (See Comments)  . Atorvastatin Other (See Comments)    intolernace  . Cefaclor Other (See Comments)  . Ciprofloxacin Nausea And Vomiting  . Codeine Hives  . Doxycycline Calcium Nausea And Vomiting  . Fenofibrate Micronized Other (See Comments)    Cramping  . Fish Oil Other (See Comments)  . Flagyl [Metronidazole Hcl]   . Levofloxacin In D5w Other (See Comments)    Unknown   . Metronidazole Nausea And Vomiting  . Ofloxacin Other (See Comments)  . Prednisolone Other (See Comments)    Anxiety, palpitations  . Promethazine Hcl Other (See Comments)  . Tape Other (See Comments)    Rash and blisters, only plastic tape  . Tetracycline Hcl Other (See Comments)  . Erythromycin Rash  . Nitrofurantoin Itching, Nausea And Vomiting and Rash     Antimicrobials this admission: 10/7 vanc >>  10/7 cipro >>   Dose adjustments this admission:  Microbiology results: 10/4 BCx (x1): ngtd 10/4 UCx: 10k E coli (pansens); >100k MRSA (R-cipro)  10/10 Resp panel (Flu A/B), Covid: sent  Thank you for allowing pharmacy to be a part of this patient's care.  Minda Ditto PharmD 03/16/2020 10:59 AM

## 2020-03-16 NOTE — Progress Notes (Signed)
PROGRESS NOTE    Melinda [name]  VOJ:[id number] DOB: 05-03-1961 DOA: 03/13/2020 PCP: Crist Infante, MD    Brief Narrative:  59 y.o. female with medical history significant of recurrent anal cancer. Presents with 1 week of N/V/ab pain. She notes that she had her last chemo/radiation treatments on Friday (03/07/20). Since that day, she had had increasing N/V/ab and groin pain. She followed up with her oncologist on 10/4. At that time, there was concern for a UTI. She was given macrobid. She was unable to tolerate that medication. So she returned to the clinic today w/ the above complains plus a concern for fever. It was recommended that she come to the hospital. With respect to her ab pain, she states that it's global, but worse in the RLQ. It's sharp and intermittent when it happens. With respect to her groin pain, she notes that it's a burning sensation with urination and bowel movement. With respect to her N/V; she is unable to tolerate solids and medications. Liquids have been increasing difficult to keep down. She denies any other aggravating or alleviating factors. She denies any other treatments  Assessment & Plan:   Active Problems:   Intractable nausea and vomiting  Intractable N/V with abd pain - CT abd reviewed. Findings notable for diffuse wall thickening with inflammatory changes involving a long segment of distal ileum, cecum, and sigmoid colon which likely represents enteritis/colitis. Suspect radiation induced  -Continue PRN anti-emetics -continued with basal IVF fluids as tolerated -Pain seems improved -Diarrhea noted overnight, no significant improvement with imodium. Will give trial of cholestyramine -Tolerating clears, does not tolerate advanced diet  UTI  - UCx from 10/4 shows e coli and MRSA, both sensitive to bactrim  - she has an extensive allergy list. 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 Burr Medico. Recommendation to continue supportive care. Consider Granix if ANC<500 -Repeat CBC in AM, ANC just under 900 today  Hx of a fib -Home propranolol was initially held secondary to soft BP -BP now improved after resumption of propranolol  Excoriations of the anus and labia - Likely secondary radiation treatment -Appreciate WOC input  Hypokalemia     - K+currently 3.4     -will correct K -repeat bmet in AM  Pancytopenia - secondary to chemo/radiation - No evidence of bleed -remains stable thus far -Recheck cbc w/ diff in aM  HLD - on Crestor prior to admit -Have resumed crestor  DM2 - Holding home metformin while in hospital - Continue SSI coverage  Diarrhea - continue imodium as needed -diarrhea noted overnight. Have added PRN cholestyramine   DVT prophylaxis: Lovenox subq Code Status: Full Family Communication: Pt in room, family not at bedside  Status is: Inpatient  Remains inpatient appropriate because:IV treatments appropriate due to intensity of illness or inability to take PO   Dispo: The patient is from: Home              Anticipated d/c is to: Home              Anticipated d/c date is: 2 days              Patient currently is not medically stable to d/c.   Consultants:   Oncology  Procedures:     Antimicrobials: Anti-infectives (From  admission, onward)   Start     Dose/Rate Route Frequency Ordered Stop   03/16/20 1445  sulfamethoxazole-trimethoprim (BACTRIM DS) 800-160 MG per tablet 1 tablet        1 tablet Oral Every 12 hours 03/16/20 1443     03/13/20 2200  vancomycin (VANCOREADY) IVPB 750 mg/150 mL  Status:  Discontinued        750 mg 150 mL/hr over 60 Minutes Intravenous 2 times daily 03/13/20 1639 03/16/20 1443   03/13/20 1700  ciprofloxacin (CIPRO) IVPB 400 mg  Status:   Discontinued        400 mg 200 mL/hr over 60 Minutes Intravenous Every 12 hours 03/13/20 1538 03/16/20 1443   03/13/20 1700  vancomycin (VANCOREADY) IVPB 750 mg/150 mL        750 mg 150 mL/hr over 60 Minutes Intravenous  Once 03/13/20 1639 03/13/20 1958      Subjective: Able to tolerate clears, but unable to advance further  Objective: Vitals:   03/15/20 2018 03/16/20 0602 03/16/20 1023 03/16/20 1332  BP: 120/72 120/65 129/74 104/69  Pulse: 74 73 72 70  Resp: 16 14  16   Temp: 98.2 F (36.8 C) 98.2 F (36.8 C)  98 F (36.7 C)  TempSrc: Oral Oral  Oral  SpO2: 96% 94%  95%  Weight:      Height:        Intake/Output Summary (Last 24 hours) at 03/16/2020 1444 Last data filed at 03/16/2020 0300 Gross per 24 hour  Intake 1773.66 ml  Output 700 ml  Net 1073.66 ml   Filed Weights   03/15/20 0606  Weight: 71.7 kg    Examination: General exam: Conversant, in no acute distress Respiratory system: normal chest rise, clear, no audible wheezing Cardiovascular system: regular rhythm, s1-s2 Gastrointestinal system: Nondistended, nontender, pos BS Central nervous system: No seizures, no tremors Extremities: No cyanosis, no joint deformities Skin: No rashes, no pallor Psychiatry: Affect normal // no auditory hallucinations   Data Reviewed: I have personally reviewed following labs and imaging studies  CBC: Recent Labs  Lab 03/10/20 1253 03/10/20 1253 03/13/20 1123 03/13/20 1742 03/14/20 0523 03/15/20 0657 03/16/20 0704  WBC 2.2*   < > 2.6* 1.6* 1.6* 1.6* 1.4*  NEUTROABS 1.7  --  1.8  --   --  1.0* 0.9*  HGB 11.8*   < > 10.8* 10.0* 9.5* 10.1* 9.9*  HCT 36.2   < > 32.7* 31.6* 29.6* 32.0* 31.4*  MCV 80.6   < > 79.2* 82.7 83.1 84.9 84.9  PLT 107*   < > 128* 132* 129* 123* 122*   < > = values in this interval not displayed.   Basic Metabolic Panel: Recent Labs  Lab 03/13/20 1123 03/13/20 1742 03/14/20 0523 03/15/20 0657 03/16/20 0704  NA 136 137 138 139 140  K  3.0* 3.4* 3.5 3.5 3.4*  CL 103 106 107 107 109  CO2 26 23 24 24 22   GLUCOSE 153* 174* 139* 114* 124*  BUN 13 13 10 10  7*  CREATININE 0.76 0.59 0.57 0.70 0.66  CALCIUM 9.0 8.1* 8.3* 8.4* 8.4*  MG  --  1.6*  --  2.3  --    GFR: Estimated Creatinine Clearance: 75 mL/min (by C-G formula based on SCr of 0.66 mg/dL). Liver Function Tests: Recent Labs  Lab 03/13/20 1123 03/13/20 1742 03/14/20 0523 03/15/20 0657 03/16/20 0704  AST 9* 11* 9* 15 12*  ALT 10 11 10 12 11   ALKPHOS 54 45 41 42 40  BILITOT 0.5 0.4 0.5 0.6 0.7  PROT 6.2* 5.7* 5.5* 5.6* 5.2*  ALBUMIN 2.8* 2.9* 2.7* 2.8* 2.7*   No results for input(s): LIPASE, AMYLASE in the last 168 hours. No results for input(s): AMMONIA in the last 168 hours. Coagulation Profile: No results for input(s): INR, PROTIME in the last 168 hours. Cardiac Enzymes: No results for input(s): CKTOTAL, CKMB, CKMBINDEX, TROPONINI in the last 168 hours. BNP (last 3 results) No results for input(s): PROBNP in the last 8760 hours. HbA1C: No results for input(s): HGBA1C in the last 72 hours. CBG: Recent Labs  Lab 03/15/20 0740 03/15/20 1144 03/15/20 1735 03/15/20 2019 03/16/20 0737  GLUCAP 99 85 127* 91 117*   Lipid Profile: No results for input(s): CHOL, HDL, LDLCALC, TRIG, CHOLHDL, LDLDIRECT in the last 72 hours. Thyroid Function Tests: No results for input(s): TSH, T4TOTAL, FREET4, T3FREE, THYROIDAB in the last 72 hours. Anemia Panel: No results for input(s): VITAMINB12, FOLATE, FERRITIN, TIBC, IRON, RETICCTPCT in the last 72 hours. Sepsis Labs: No results for input(s): PROCALCITON, LATICACIDVEN in the last 168 hours.  Recent Results (from the past 240 hour(s))  Culture, Urine     Status: Abnormal   Collection Time: 03/10/20  3:06 PM   Specimen: Urine, Clean Catch  Result Value Ref Range Status   Specimen Description   Final    URINE, CLEAN CATCH Performed at [facility], [address], [place], [place]  [address]    Special Requests   Final    NONE Performed at [facility], [place] [address] [place], [place] [address]    Culture (A)  Final    10,000 COLONIES/mL ESCHERICHIA COLI >=100,000 COLONIES/mL METHICILLIN RESISTANT STAPHYLOCOCCUS AUREUS    Report Status 03/12/2020 FINAL  Final   Organism ID, Bacteria ESCHERICHIA COLI (A)  Final   Organism ID, Bacteria METHICILLIN RESISTANT STAPHYLOCOCCUS AUREUS (A)  Final      Susceptibility   Escherichia coli - MIC*    AMPICILLIN <=2 SENSITIVE Sensitive     CEFAZOLIN <=4 SENSITIVE Sensitive     CEFTRIAXONE <=0.25 SENSITIVE Sensitive     CIPROFLOXACIN <=0.25 SENSITIVE Sensitive     GENTAMICIN <=1 SENSITIVE Sensitive     IMIPENEM <=0.25 SENSITIVE Sensitive     NITROFURANTOIN <=16 SENSITIVE Sensitive     TRIMETH/SULFA <=20 SENSITIVE Sensitive     AMPICILLIN/SULBACTAM <=2 SENSITIVE Sensitive     PIP/TAZO <=4 SENSITIVE Sensitive     * 10,000 COLONIES/mL ESCHERICHIA COLI   Methicillin resistant staphylococcus aureus - MIC*    CIPROFLOXACIN >=8 RESISTANT Resistant     GENTAMICIN <=0.5 SENSITIVE Sensitive     NITROFURANTOIN <=16 SENSITIVE Sensitive     OXACILLIN >=4 RESISTANT Resistant     TETRACYCLINE <=1 SENSITIVE Sensitive     VANCOMYCIN 1 SENSITIVE Sensitive     TRIMETH/SULFA <=10 SENSITIVE Sensitive     CLINDAMYCIN <=0.25 SENSITIVE Sensitive     RIFAMPIN <=0.5 SENSITIVE Sensitive     Inducible Clindamycin NEGATIVE Sensitive     * >=100,000 COLONIES/mL METHICILLIN RESISTANT STAPHYLOCOCCUS AUREUS  Culture, blood (routine x 2)     Status: None   Collection Time: 03/10/20  3:54 PM   Specimen: BLOOD  Result Value Ref Range Status   Specimen Description   Final    BLOOD LEFT ARM Performed at [facility], [place] [address], [place], [place] [address]    Special Requests   Final    BOTTLES DRAWN AEROBIC AND ANAEROBIC Blood Culture adequate  volume   Culture   Final    NO GROWTH 5 DAYS Performed  at [facility], [place] [address], [place], [place] [address]    Report Status 03/15/2020 FINAL  Final  Respiratory Panel by RT PCR (Flu A&B, Covid) - Nasopharyngeal Swab     Status: None   Collection Time: 03/16/20  7:52 AM   Specimen: Nasopharyngeal Swab  Result Value Ref Range Status   SARS Coronavirus 2 by RT PCR NEGATIVE NEGATIVE Final    Comment: (NOTE) SARS-CoV-2 target nucleic acids are NOT DETECTED.  The SARS-CoV-2 RNA is generally detectable in upper respiratoy specimens during the acute phase of infection. The lowest concentration of SARS-CoV-2 viral copies this assay can detect is 131 copies/mL. A negative result does not preclude SARS-Cov-2 infection and should not be used as the sole basis for treatment or other patient management decisions. A negative result may occur with  improper specimen collection/handling, submission of specimen other than nasopharyngeal swab, presence of viral mutation(s) within the areas targeted by this assay, and inadequate number of viral copies (<131 copies/mL). A negative result must be combined with clinical observations, patient history, and epidemiological information. The expected result is Negative.  Fact Sheet for Patients:  PinkCheek.be  Fact Sheet for Healthcare Providers:  GravelBags.it  This test is no t yet approved or cleared by the [place] FDA and  has been authorized for detection and/or diagnosis of SARS-CoV-2 by FDA under an Emergency Use Authorization (EUA). This EUA will remain  in effect (meaning this test can be used) for the duration of the COVID-19 declaration under Section 564(b)(1) of the Act, 21 U.S.C. section 360bbb-3(b)(1), unless the authorization is terminated or revoked sooner.     Influenza A by PCR NEGATIVE NEGATIVE Final   Influenza B by PCR NEGATIVE NEGATIVE Final    Comment: (NOTE) The Xpert Xpress SARS-CoV-2/FLU/RSV assay is  intended as an aid in  the diagnosis of influenza from Nasopharyngeal swab specimens and  should not be used as a sole basis for treatment. Nasal washings and  aspirates are unacceptable for Xpert Xpress SARS-CoV-2/FLU/RSV  testing.  Fact Sheet for Patients: PinkCheek.be  Fact Sheet for Healthcare Providers: GravelBags.it  This test is not yet approved or cleared by the [place] FDA and  has been authorized for detection and/or diagnosis of SARS-CoV-2 by  FDA under an Emergency Use Authorization (EUA). This EUA will remain  in effect (meaning this test can be used) for the duration of the  Covid-19 declaration under Section 564(b)(1) of the Act, 21  U.S.C. section 360bbb-3(b)(1), unless the authorization is  terminated or revoked. Performed at [facility], [place] [address], [place], [place] [address]      Radiology Studies: No results found.  Scheduled Meds: . clorazepate  7.5 mg Oral BID  . enoxaparin (LOVENOX) injection  40 mg Subcutaneous Q24H  . FLUoxetine  20 mg Oral q morning - 10a  . insulin aspart  0-15 Units Subcutaneous TID WC  . insulin aspart  0-5 Units Subcutaneous QHS  . liver oil-zinc oxide   Topical TID  . propranolol  10 mg Oral BID  . rosuvastatin  5 mg Oral QPM  . sulfamethoxazole-trimethoprim  1 tablet Oral Q12H   Continuous Infusions: . sodium chloride 100 mL/hr at 03/16/20 1057     LOS: 3 days   Marylu Lund, MD [facility] Pager On Amion  If 7PM-7AM, please contact night-coverage 03/16/2020, 2:44 PM

## 2020-03-17 ENCOUNTER — Ambulatory Visit: Payer: BC Managed Care – PPO

## 2020-03-17 LAB — COMPREHENSIVE METABOLIC PANEL WITH GFR
ALT: 12 U/L (ref 0–44)
AST: 12 U/L — ABNORMAL LOW (ref 15–41)
Albumin: 2.8 g/dL — ABNORMAL LOW (ref 3.5–5.0)
Alkaline Phosphatase: 41 U/L (ref 38–126)
Anion gap: 9 (ref 5–15)
BUN: 5 mg/dL — ABNORMAL LOW (ref 8–23)
CO2: 21 mmol/L — ABNORMAL LOW (ref 22–32)
Calcium: 8.5 mg/dL — ABNORMAL LOW (ref 8.9–10.3)
Chloride: 107 mmol/L (ref 98–111)
Creatinine, Ser: 0.8 mg/dL (ref 0.44–1.00)
GFR, Estimated: >60 mL/min (ref >60)
Glucose, Bld: 106 mg/dL — ABNORMAL HIGH (ref 70–99)
Potassium: 4 mmol/L (ref 3.5–5.1)
Sodium: 137 mmol/L (ref 135–145)
Total Bilirubin: 0.7 mg/dL (ref 0.3–1.2)
Total Protein: 5.6 g/dL — ABNORMAL LOW (ref 6.5–8.1)

## 2020-03-17 LAB — CBC WITH DIFFERENTIAL/PLATELET
Abs Immature Granulocytes: 0.08 K/uL — ABNORMAL HIGH (ref 0.00–0.07)
Basophils Absolute: 0 K/uL (ref 0.0–0.1)
Basophils Relative: 1 %
Eosinophils Absolute: 0.1 K/uL (ref 0.0–0.5)
Eosinophils Relative: 3 %
HCT: 34.4 % — ABNORMAL LOW (ref 36.0–46.0)
Hemoglobin: 10.6 g/dL — ABNORMAL LOW (ref 12.0–15.0)
Immature Granulocytes: 4 %
Lymphocytes Relative: 10 %
Lymphs Abs: 0.2 K/uL — ABNORMAL LOW (ref 0.7–4.0)
MCH: 26.4 pg (ref 26.0–34.0)
MCHC: 30.8 g/dL (ref 30.0–36.0)
MCV: 85.6 fL (ref 80.0–100.0)
Monocytes Absolute: 0.3 K/uL (ref 0.1–1.0)
Monocytes Relative: 14 %
Neutro Abs: 1.2 K/uL — ABNORMAL LOW (ref 1.7–7.7)
Neutrophils Relative %: 68 %
Platelets: 143 K/uL — ABNORMAL LOW (ref 150–400)
RBC: 4.02 MIL/uL (ref 3.87–5.11)
RDW: 17.1 % — ABNORMAL HIGH (ref 11.5–15.5)
WBC: 1.8 K/uL — ABNORMAL LOW (ref 4.0–10.5)
nRBC: 0 % (ref 0.0–0.2)

## 2020-03-17 LAB — GLUCOSE, CAPILLARY
Glucose-Capillary: 99 mg/dL (ref 70–99)
Glucose-Capillary: 87 mg/dL (ref 70–99)
Glucose-Capillary: 110 mg/dL — ABNORMAL HIGH (ref 70–99)
Glucose-Capillary: 142 mg/dL — ABNORMAL HIGH (ref 70–99)

## 2020-03-17 MED ORDER — ENSURE ENLIVE PO LIQD
237.0000 mL | ORAL | Status: DC
  Administered 2020-03-20: 237 mL via ORAL

## 2020-03-17 MED ORDER — BENEPROTEIN PO POWD
1.0000 | Freq: Three times a day (TID) | ORAL | Status: DC
  Filled 2020-03-17: qty 227

## 2020-03-17 MED ORDER — BOOST / RESOURCE BREEZE PO LIQD CUSTOM
1.0000 | Freq: Three times a day (TID) | ORAL | Status: DC
  Administered 2020-03-17: 1 via ORAL

## 2020-03-17 MED ORDER — RESOURCE INSTANT PROTEIN PO PWD PACKET
1.0000 | Freq: Three times a day (TID) | ORAL | Status: DC
  Administered 2020-03-17 (×2): 6 g via ORAL
  Filled 2020-03-17 (×11): qty 6

## 2020-03-17 NOTE — Progress Notes (Signed)
PROGRESS NOTE    Melinda [name]  YTK:[id number] DOB: 02-May-1961 DOA: 03/13/2020 PCP: Crist Infante, MD    Brief Narrative:  59 y.o. female with medical history significant of recurrent anal cancer. Presents with 1 week of N/V/ab pain. She notes that she had her last chemo/radiation treatments on Friday (03/07/20). Since that day, she had had increasing N/V/ab and groin pain. She followed up with her oncologist on 10/4. At that time, there was concern for a UTI. She was given macrobid. She was unable to tolerate that medication. So she returned to the clinic today w/ the above complains plus a concern for fever. It was recommended that she come to the hospital. With respect to her ab pain, she states that it's global, but worse in the RLQ. It's sharp and intermittent when it happens. With respect to her groin pain, she notes that it's a burning sensation with urination and bowel movement. With respect to her N/V; she is unable to tolerate solids and medications. Liquids have been increasing difficult to keep down. She denies any other aggravating or alleviating factors. She denies any other treatments  Assessment & Plan:   Active Problems:   Intractable nausea and vomiting  Intractable N/V with abd pain - CT abd reviewed. Findings notable for diffuse wall thickening with inflammatory changes involving a long segment of distal ileum, cecum, and sigmoid colon which likely represents enteritis/colitis. Suspect radiation induced  -Continue PRN anti-emetics -continued with basal IVF fluids as tolerated -Still having abd discomfort but pt attributes to "hunger pains" and is asking to advance to soft diet today -Still with intermittent diarrhea  UTI  - UCx from 10/4 shows e coli and MRSA, both sensitive to bactrim  - she has an extensive allergy list.  - Was on cipro and vanc initially, now on PO bactrim based on culture results. Pt tolerating abx  Recurrent squamous cell carcinoma of the  anus -Pt is s/p chemo, radiation started but now on hold -Appreciate input by Dr. Burr Medico. Recommendation to continue supportive care. Granix if ANC<500 -Repeat CBC in AM, Today ANC is over 1200  Hx of a fib -Home propranolol currently on hold for soft BP -Currently rate controlled.   Excoriations of the anus and labia - Likely secondary radiation treatment -Appreciate WOC input  Hypokalemia     - K+ normalized to 4.0 -repeat bmet in AM  Pancytopenia - secondary to chemo/radiation - No evidence of active bleed -remains hemodynamically stable thus far -Recheck cbc w/ diff in AM  HLD - on Crestor prior to admit -Continue crestor as tolerated  DM2 - Holding home metformin while in hospital - Continue SSI coverage -Glycemic trends are stable  Diarrhea - continue imodium as needed -also on PRN cholestyramine -Likely secondary to above colitis   DVT prophylaxis: Lovenox subq Code Status: Full Family Communication: Pt in room, family not at bedside  Status is: Inpatient  Remains inpatient appropriate because:IV treatments appropriate due to intensity of illness or inability to take PO   Dispo: The patient is from: Home              Anticipated d/c is to: Home              Anticipated d/c date is: 2 days              Patient currently is not medically stable to d/c.   Consultants:   Oncology  Procedures:     Antimicrobials: Anti-infectives (From admission, onward)  Start     Dose/Rate Route Frequency Ordered Stop   03/16/20 1530  sulfamethoxazole-trimethoprim (BACTRIM DS) 800-160 MG per tablet 1 tablet        1 tablet Oral Every 12 hours 03/16/20 1443     03/13/20 2200  vancomycin (VANCOREADY) IVPB 750 mg/150 mL  Status:  Discontinued        750 mg 150 mL/hr over 60 Minutes Intravenous 2 times daily 03/13/20 1639 03/16/20 1443   03/13/20 1700  ciprofloxacin (CIPRO) IVPB 400 mg  Status:  Discontinued        400 mg 200 mL/hr over 60 Minutes  Intravenous Every 12 hours 03/13/20 1538 03/16/20 1443   03/13/20 1700  vancomycin (VANCOREADY) IVPB 750 mg/150 mL        750 mg 150 mL/hr over 60 Minutes Intravenous  Once 03/13/20 1639 03/13/20 1958      Subjective: Complains of continued abd discomfort but is willing and eager to advance diet  Objective: Vitals:   03/16/20 1332 03/16/20 1844 03/17/20 0513 03/17/20 1533  BP: 104/69  99/62 105/69  Pulse: 70  85 87  Resp: 16  15 17   Temp: 98 F (36.7 C)  98.5 F (36.9 C) 98.2 F (36.8 C)  TempSrc: Oral  Oral Oral  SpO2: 95%  94% 94%  Weight:  68.5 kg    Height:  5\' 6"  (1.676 m)      Intake/Output Summary (Last 24 hours) at 03/17/2020 1639 Last data filed at 03/17/2020 0604 Gross per 24 hour  Intake 1320 ml  Output 750 ml  Net 570 ml   Filed Weights   03/15/20 0606 03/16/20 1844  Weight: 71.7 kg 68.5 kg    Examination: General exam: Awake, laying in bed, in nad Respiratory system: Normal respiratory effort, no wheezing Cardiovascular system: regular rate, s1, s2 Gastrointestinal system: tenderness over RLQ and hypogastric region, pos BS Central nervous system: CN2-12 grossly intact, strength intact Extremities: Perfused, no clubbing Skin: Normal skin turgor, no notable skin lesions seen Psychiatry: Mood normal // no visual hallucinations   Data Reviewed: I have personally reviewed following labs and imaging studies  CBC: Recent Labs  Lab 03/13/20 1123 03/13/20 1123 03/13/20 1742 03/14/20 0523 03/15/20 0657 03/16/20 0704 03/17/20 0610  WBC 2.6*  --  1.6* 1.6* 1.6* 1.4* 1.8*  NEUTROABS 1.8  --   --   --  1.0* 0.9* 1.2*  HGB 10.8*  --  10.0* 9.5* 10.1* 9.9* 10.6*  HCT 32.7*   < > 31.6* 29.6* 32.0* 31.4* 34.4*  MCV 79.2*   < > 82.7 83.1 84.9 84.9 85.6  PLT 128*  --  132* 129* 123* 122* 143*   < > = values in this interval not displayed.   Basic Metabolic Panel: Recent Labs  Lab 03/13/20 1742 03/14/20 0523 03/15/20 0657 03/16/20 0704 03/17/20 0610   NA 137 138 139 140 137  K 3.4* 3.5 3.5 3.4* 4.0  CL 106 107 107 109 107  CO2 23 24 24 22  21*  GLUCOSE 174* 139* 114* 124* 106*  BUN 13 10 10  7* 5*  CREATININE 0.59 0.57 0.70 0.66 0.80  CALCIUM 8.1* 8.3* 8.4* 8.4* 8.5*  MG 1.6*  --  2.3  --   --    GFR: Estimated Creatinine Clearance: 69.1 mL/min (by C-G formula based on SCr of 0.8 mg/dL). Liver Function Tests: Recent Labs  Lab 03/13/20 1742 03/14/20 0523 03/15/20 0657 03/16/20 0704 03/17/20 0610  AST 11* 9* 15 12* 12*  ALT  11 10 12 11 12   ALKPHOS 45 41 42 40 41  BILITOT 0.4 0.5 0.6 0.7 0.7  PROT 5.7* 5.5* 5.6* 5.2* 5.6*  ALBUMIN 2.9* 2.7* 2.8* 2.7* 2.8*   No results for input(s): LIPASE, AMYLASE in the last 168 hours. No results for input(s): AMMONIA in the last 168 hours. Coagulation Profile: No results for input(s): INR, PROTIME in the last 168 hours. Cardiac Enzymes: No results for input(s): CKTOTAL, CKMB, CKMBINDEX, TROPONINI in the last 168 hours. BNP (last 3 results) No results for input(s): PROBNP in the last 8760 hours. HbA1C: No results for input(s): HGBA1C in the last 72 hours. CBG: Recent Labs  Lab 03/16/20 1209 03/16/20 1646 03/16/20 2044 03/17/20 0741 03/17/20 1204  GLUCAP 92 93 94 99 87   Lipid Profile: No results for input(s): CHOL, HDL, LDLCALC, TRIG, CHOLHDL, LDLDIRECT in the last 72 hours. Thyroid Function Tests: No results for input(s): TSH, T4TOTAL, FREET4, T3FREE, THYROIDAB in the last 72 hours. Anemia Panel: No results for input(s): VITAMINB12, FOLATE, FERRITIN, TIBC, IRON, RETICCTPCT in the last 72 hours. Sepsis Labs: No results for input(s): PROCALCITON, LATICACIDVEN in the last 168 hours.  Recent Results (from the past 240 hour(s))  Culture, Urine     Status: Abnormal   Collection Time: 03/10/20  3:06 PM   Specimen: Urine, Clean Catch  Result Value Ref Range Status   Specimen Description   Final    URINE, CLEAN CATCH Performed at [facility], [address], [place], [place] [address]    Special Requests   Final    NONE Performed at [facility], [place] [address], [place], [place] [address]    Culture (A)  Final    10,000 COLONIES/mL ESCHERICHIA COLI >=100,000 COLONIES/mL METHICILLIN RESISTANT STAPHYLOCOCCUS AUREUS    Report Status 03/12/2020 FINAL  Final   Organism ID, Bacteria ESCHERICHIA COLI (A)  Final   Organism ID, Bacteria METHICILLIN RESISTANT STAPHYLOCOCCUS AUREUS (A)  Final      Susceptibility   Escherichia coli - MIC*    AMPICILLIN <=2 SENSITIVE Sensitive     CEFAZOLIN <=4 SENSITIVE Sensitive     CEFTRIAXONE <=0.25 SENSITIVE Sensitive     CIPROFLOXACIN <=0.25 SENSITIVE Sensitive     GENTAMICIN <=1 SENSITIVE Sensitive     IMIPENEM <=0.25 SENSITIVE Sensitive     NITROFURANTOIN <=16 SENSITIVE Sensitive     TRIMETH/SULFA <=20 SENSITIVE Sensitive     AMPICILLIN/SULBACTAM <=2 SENSITIVE Sensitive     PIP/TAZO <=4 SENSITIVE Sensitive     * 10,000 COLONIES/mL ESCHERICHIA COLI   Methicillin resistant staphylococcus aureus - MIC*    CIPROFLOXACIN >=8 RESISTANT Resistant     GENTAMICIN <=0.5 SENSITIVE Sensitive     NITROFURANTOIN <=16 SENSITIVE Sensitive     OXACILLIN >=4 RESISTANT Resistant     TETRACYCLINE <=1 SENSITIVE Sensitive     VANCOMYCIN 1 SENSITIVE Sensitive     TRIMETH/SULFA <=10 SENSITIVE Sensitive     CLINDAMYCIN <=0.25 SENSITIVE Sensitive     RIFAMPIN <=0.5 SENSITIVE Sensitive     Inducible Clindamycin NEGATIVE Sensitive     * >=100,000 COLONIES/mL METHICILLIN RESISTANT STAPHYLOCOCCUS AUREUS  Culture, blood (routine x 2)     Status: None   Collection Time: 03/10/20  3:54 PM   Specimen: BLOOD  Result Value Ref Range Status   Specimen Description   Final    BLOOD LEFT ARM Performed at [facility], [place] [address] [place], [place] [address]    Special Requests  Final    BOTTLES DRAWN AEROBIC AND ANAEROBIC Blood Culture adequate volume   Culture   Final     NO GROWTH 5 DAYS Performed at [facility], [place] [address] [place], [place] [address]    Report Status 03/15/2020 FINAL  Final  Respiratory Panel by RT PCR (Flu A&B, Covid) - Nasopharyngeal Swab     Status: None   Collection Time: 03/16/20  7:52 AM   Specimen: Nasopharyngeal Swab  Result Value Ref Range Status   SARS Coronavirus 2 by RT PCR NEGATIVE NEGATIVE Final    Comment: (NOTE) SARS-CoV-2 target nucleic acids are NOT DETECTED.  The SARS-CoV-2 RNA is generally detectable in upper respiratoy specimens during the acute phase of infection. The lowest concentration of SARS-CoV-2 viral copies this assay can detect is 131 copies/mL. A negative result does not preclude SARS-Cov-2 infection and should not be used as the sole basis for treatment or other patient management decisions. A negative result may occur with  improper specimen collection/handling, submission of specimen other than nasopharyngeal swab, presence of viral mutation(s) within the areas targeted by this assay, and inadequate number of viral copies (<131 copies/mL). A negative result must be combined with clinical observations, patient history, and epidemiological information. The expected result is Negative.  Fact Sheet for Patients:  PinkCheek.be  Fact Sheet for Healthcare Providers:  GravelBags.it  This test is no t yet approved or cleared by the [place] FDA and  has been authorized for detection and/or diagnosis of SARS-CoV-2 by FDA under an Emergency Use Authorization (EUA). This EUA will remain  in effect (meaning this test can be used) for the duration of the COVID-19 declaration under Section 564(b)(1) of the Act, 21 U.S.C. section 360bbb-3(b)(1), unless the authorization is terminated or revoked sooner.     Influenza A by PCR NEGATIVE NEGATIVE Final   Influenza B by PCR NEGATIVE NEGATIVE Final    Comment: (NOTE) The Xpert Xpress  SARS-CoV-2/FLU/RSV assay is intended as an aid in  the diagnosis of influenza from Nasopharyngeal swab specimens and  should not be used as a sole basis for treatment. Nasal washings and  aspirates are unacceptable for Xpert Xpress SARS-CoV-2/FLU/RSV  testing.  Fact Sheet for Patients: PinkCheek.be  Fact Sheet for Healthcare Providers: GravelBags.it  This test is not yet approved or cleared by the [place] FDA and  has been authorized for detection and/or diagnosis of SARS-CoV-2 by  FDA under an Emergency Use Authorization (EUA). This EUA will remain  in effect (meaning this test can be used) for the duration of the  Covid-19 declaration under Section 564(b)(1) of the Act, 21  U.S.C. section 360bbb-3(b)(1), unless the authorization is  terminated or revoked. Performed at [facility], [place] [address], [place], [place] [address]      Radiology Studies: No results found.  Scheduled Meds: . clorazepate  7.5 mg Oral BID  . enoxaparin (LOVENOX) injection  40 mg Subcutaneous Q24H  . [START ON 03/18/2020] feeding supplement (ENSURE ENLIVE)  237 mL Oral Q24H  . FLUoxetine  20 mg Oral q morning - 10a  . insulin aspart  0-15 Units Subcutaneous TID WC  . insulin aspart  0-5 Units Subcutaneous QHS  . protein supplement  1 Scoop Oral TID WC  . rosuvastatin  5 mg Oral QPM  . sulfamethoxazole-trimethoprim  1 tablet Oral Q12H   Continuous Infusions: . sodium chloride 100 mL/hr at 03/17/20 0735     LOS: 4 days   Marylu Lund,  MD [facility] Pager On Amion  If 7PM-7AM, please contact night-coverage 03/17/2020, 4:39 PM

## 2020-03-17 NOTE — Progress Notes (Signed)
Nutrition Follow-up  INTERVENTION:   -Ensure Enlive po daily, each supplement provides 350 kcal and 20 grams of protein -d/c Boost Breeze -Beneprotein powder TID w/ meals, each provides 25 kcals and 6g protein  NUTRITION DIAGNOSIS:   Increased nutrient needs related to cancer and cancer related treatments as evidenced by estimated needs.  Ongoing.  GOAL:   Patient will meet greater than or equal to 90% of their needs  Progressing.  MONITOR:   PO intake, Supplement acceptance, Weight trends, Labs, I & O's  REASON FOR ASSESSMENT:   Consult Assessment of nutrition requirement/status  ASSESSMENT:   59 y.o. female with medical history significant of recurrent anal cancer. Presents with 1 week of N/V/ab pain. She notes that she had her last chemo/radiation treatments on Friday (03/07/20). Since that day, she had had increasing N/V/ab and groin pain. She followed up with her oncologist on 10/4. At that time, there was concern for a UTI.  Spoke with pt at bedside. Pt reports she is having a lot of diarrhea, anytime after she eats and she is taking the maximum dose of imodium she can. Reviewed bland diet and foods to help with bulking stools. Pt familiar with these foods and tried to incorporate them in to her diet at home when the diarrhea started. Reviewed options pt should try while on a soft diet.   Pt reports she does not like Boost Breeze supplements, will d/c. Pt weary of Ensure as she is concerned about dairy products while she is having colitis symptoms. Ordered Ensure for tomorrow and also trialing Beneprotein powder that she can add to applesauce. Reviewed importance of protein as well in diet.  Pt states at this time her treatments are on hold and she plans to not continue radiation.  Pt's PO intake has been poor since last chemotherapy treatment which was 10/1 (10 days). At risk of malnutrition if cannot improve intakes soon.  Last recorded weight: 151 lbs on 10/10. Pt  states she weighed around 149 lbs at home PTA. She did gain some weight back and her weight was around 165 lbs. Then began to drop after this last treatment. This would be a 15 lbs weight loss x 10 days (9% wt loss x 10 days, significant).  Medications reviewed. Labs reviewed: CBGs: 87-99  NUTRITION - FOCUSED PHYSICAL EXAM:    Most Recent Value  Orbital Region No depletion  Upper Arm Region No depletion  Thoracic and Lumbar Region Unable to assess  Buccal Region No depletion  Temple Region No depletion  Clavicle Bone Region No depletion  Clavicle and Acromion Bone Region No depletion  Scapular Bone Region No depletion  Dorsal Hand No depletion  Patellar Region Mild depletion  Anterior Thigh Region No depletion  Posterior Calf Region Mild depletion  Edema (RD Assessment) None       Diet Order:   Diet Order            DIET SOFT Room service appropriate? Yes; Fluid consistency: Thin  Diet effective now                 EDUCATION NEEDS:   Education needs have been addressed  Skin:  Skin Assessment: Reviewed RN Assessment  Last BM:  10/11 -type 7  Height:   Ht Readings from Last 1 Encounters:  03/16/20 5\' 6"  (1.676 m)    Weight:   Wt Readings from Last 1 Encounters:  03/16/20 68.5 kg    BMI:  Body mass index is 24.37 kg/m.  Estimated Nutritional Needs:   Kcal:  2100-2300  Protein:  100-115g  Fluid:  2.1L/day   Clayton Bibles, MS, RD, LDN Inpatient Clinical Dietitian Contact information available via Amion

## 2020-03-18 ENCOUNTER — Ambulatory Visit: Payer: BC Managed Care – PPO

## 2020-03-18 DIAGNOSIS — R112 Nausea with vomiting, unspecified: Secondary | ICD-10-CM | POA: Diagnosis not present

## 2020-03-18 LAB — CBC WITH DIFFERENTIAL/PLATELET
Abs Immature Granulocytes: 0.08 10*3/uL — ABNORMAL HIGH (ref 0.00–0.07)
Basophils Absolute: 0 10*3/uL (ref 0.0–0.1)
Basophils Relative: 1 %
Eosinophils Absolute: 0.1 10*3/uL (ref 0.0–0.5)
Eosinophils Relative: 3 %
HCT: 33.7 % — ABNORMAL LOW (ref 36.0–46.0)
Hemoglobin: 10.6 g/dL — ABNORMAL LOW (ref 12.0–15.0)
Immature Granulocytes: 4 %
Lymphocytes Relative: 8 %
Lymphs Abs: 0.2 10*3/uL — ABNORMAL LOW (ref 0.7–4.0)
MCH: 26.8 pg (ref 26.0–34.0)
MCHC: 31.5 g/dL (ref 30.0–36.0)
MCV: 85.3 fL (ref 80.0–100.0)
Monocytes Absolute: 0.2 10*3/uL (ref 0.1–1.0)
Monocytes Relative: 12 %
Neutro Abs: 1.4 10*3/uL — ABNORMAL LOW (ref 1.7–7.7)
Neutrophils Relative %: 72 %
Platelets: 123 10*3/uL — ABNORMAL LOW (ref 150–400)
RBC: 3.95 MIL/uL (ref 3.87–5.11)
RDW: 17.1 % — ABNORMAL HIGH (ref 11.5–15.5)
WBC: 1.9 10*3/uL — ABNORMAL LOW (ref 4.0–10.5)
nRBC: 0 % (ref 0.0–0.2)

## 2020-03-18 LAB — COMPREHENSIVE METABOLIC PANEL
ALT: 13 U/L (ref 0–44)
AST: 12 U/L — ABNORMAL LOW (ref 15–41)
Albumin: 2.7 g/dL — ABNORMAL LOW (ref 3.5–5.0)
Alkaline Phosphatase: 41 U/L (ref 38–126)
Anion gap: 11 (ref 5–15)
BUN: 6 mg/dL — ABNORMAL LOW (ref 8–23)
CO2: 21 mmol/L — ABNORMAL LOW (ref 22–32)
Calcium: 8.5 mg/dL — ABNORMAL LOW (ref 8.9–10.3)
Chloride: 108 mmol/L (ref 98–111)
Creatinine, Ser: 0.92 mg/dL (ref 0.44–1.00)
GFR, Estimated: >60 mL/min (ref >60)
Glucose, Bld: 113 mg/dL — ABNORMAL HIGH (ref 70–99)
Potassium: 3.3 mmol/L — ABNORMAL LOW (ref 3.5–5.1)
Sodium: 140 mmol/L (ref 135–145)
Total Bilirubin: 0.5 mg/dL (ref 0.3–1.2)
Total Protein: 5.5 g/dL — ABNORMAL LOW (ref 6.5–8.1)

## 2020-03-18 LAB — GLUCOSE, CAPILLARY
Glucose-Capillary: 102 mg/dL — ABNORMAL HIGH (ref 70–99)
Glucose-Capillary: 114 mg/dL — ABNORMAL HIGH (ref 70–99)
Glucose-Capillary: 87 mg/dL (ref 70–99)
Glucose-Capillary: 101 mg/dL — ABNORMAL HIGH (ref 70–99)

## 2020-03-18 MED ORDER — POTASSIUM CHLORIDE CRYS ER 20 MEQ PO TBCR
40.0000 meq | EXTENDED_RELEASE_TABLET | Freq: Two times a day (BID) | ORAL | Status: AC
  Administered 2020-03-18 (×2): 40 meq via ORAL
  Filled 2020-03-18 (×2): qty 2

## 2020-03-18 MED ORDER — POTASSIUM CHLORIDE 20 MEQ PO PACK
40.0000 meq | PACK | Freq: Two times a day (BID) | ORAL | Status: DC
  Administered 2020-03-18: 40 meq via ORAL
  Filled 2020-03-18: qty 2

## 2020-03-18 NOTE — Progress Notes (Addendum)
HEMATOLOGY-ONCOLOGY PROGRESS NOTE  SUBJECTIVE: She has been advanced to a soft diet.  Had some abdominal discomfort overnight and continues to have intermittent diarrhea.  Thinks that she had about 5-6 loose stools yesterday.  Takes Imodium as needed.  She is not having any nausea or vomiting.  Remains afebrile.  She has no other complaints today.  Oncology History Overview Note  Cancer Staging No matching staging information was found for the patient.    Squamous cell cancer, anus ([place])  07/13/2019 Procedure    EUA, Excision of posterior internal/external hemorrhoid under the care of Dr. Hassell Done    07/13/2019 Pathology Results   - Invasive moderately differentiated squamous cell carcinoma, 1.4 cm.  See comment  - Carcinoma invades for depth of 0.4 cm  - Deep resection margin is negative for carcinoma (0.2 cm)  - Lateral mucosal margin is positive for high-grade dysplasia  - No evidence of lymphovascular perineural invasion  Procedure: Local excision  Tumor Site: Anal canal  Tumor Size: 1.4 cm  Histologic Type: Invasive squamous cell carcinoma  Histologic Grade: G2: Moderately differentiated  Tumor Extension: Carcinoma invades superficial anal sphincter muscle  Margins: Uninvolved by tumor  Treatment Effect: N/A  Regional Lymph Nodes: No lymph nodes submitted or found  Pathologic Stage Classification (pTNM, AJCC 8th Edition):  pT1, pNx  Representative Tumor Block: A1  Comment(s): Lateral mucosal margin is positive for high-grade squamous  dysplasia      07/13/2019 Initial Diagnosis   Squamous cell cancer, anus ([place])   09/04/2019 Imaging   CT scan Chest, Abdomen, and Pelvis   IMPRESSION: 1. No evidence of metastatic disease in the chest, abdomen or pelvis. 2. No discrete anorectal mass. 3. Chronic findings include: Punctate nonobstructing upper right renal stones and chronic right renal scarring. 4. Aortic Atherosclerosis (ICD10-I70.0).   01/10/2020 Pathology Results   A. ANAL  LESION, POSTERIOR MIDLINE, EXCISION:  - Squamous cell carcinoma, moderately differentiated.  Verbally reported by Dr. Saralyn Pilar 1.5 cm, negative margins, depth <1 mm   01/11/2020 Procedure   1. Excision of anal canal lesion (posterior midline) under the care of Dr. Dema Severin     01/28/2020 PET scan   IMPRESSION: 1. Mild focal anal hypermetabolism without discrete mass correlate on the CT images, nonspecific, differential includes postsurgical change or residual anal tumor. 2. No hypermetabolic locoregional or distant metastatic disease. 3. Chronic findings include: Aortic Atherosclerosis (ICD10-I70.0). Diffuse hepatic steatosis. Nonobstructing right nephrolithiasis.     02/04/2020 - 03/13/2020 Radiation Therapy   concurrent chemoRT by Dr Lisbeth Renshaw with Mitomycin and 5FU starting 02/04/20-03/13/20   02/04/2020 - 03/03/2020 Chemotherapy   Concurrent chemoRT with Mitomycin and 5FU on week 1 and 5 starting 02/04/20-03/03/20      REVIEW OF SYSTEMS:   Constitutional: Denies fevers, chills or abnormal weight loss Eyes: Denies blurriness of vision Ears, nose, mouth, throat, and face: Denies mucositis or sore throat Respiratory: Denies cough, dyspnea or wheezes Cardiovascular: Denies palpitation, chest discomfort Gastrointestinal: Reports abdominal discomfort.  Denies nausea and vomiting.  Has diarrhea. Skin: Denies abnormal skin rashes Lymphatics: Denies new lymphadenopathy or easy bruising Neurological:Denies numbness, tingling or new weaknesses Behavioral/Psych: Mood is stable, no new changes  Extremities: No lower extremity edema All other systems were reviewed with the patient and are negative.  I have reviewed the past medical history, past surgical history, social history and family history with the patient and they are unchanged from previous note.   PHYSICAL EXAMINATION: ECOG PERFORMANCE STATUS: 3 - Symptomatic, >50% confined to bed  Vitals:  03/17/20 2111 03/18/20 0610  BP: 118/64 (!)  103/51  Pulse: 80 91  Resp: 18 17  Temp: 98.8 F (37.1 C) 98.4 F (36.9 C)  SpO2: 98% 98%   Filed Weights   03/15/20 0606 03/16/20 1844  Weight: 71.7 kg 68.5 kg    Intake/Output from previous day: No intake/output data recorded.  GENERAL:alert, no distress and comfortable SKIN: skin color, texture, turgor are normal, no rashes or significant lesions EYES: normal, Conjunctiva are pink and non-injected, sclera clear OROPHARYNX:no exudate, no erythema and lips, buccal mucosa, and tongue normal  LUNGS: clear to auscultation and percussion with normal breathing effort HEART: regular rate & rhythm and no murmurs and no lower extremity edema ABDOMEN: Positive bowel sounds, soft, nontender NEURO: alert & oriented x 3 with fluent speech, no focal motor/sensory deficits  LABORATORY DATA:  I have reviewed the data as listed CMP Latest Ref Rng & Units 03/18/2020 03/17/2020 03/16/2020  Glucose 70 - 99 mg/dL 113(H) 106(H) 124(H)  BUN 8 - 23 mg/dL 6(L) 5(L) 7(L)  Creatinine 0.44 - 1.00 mg/dL 0.92 0.80 0.66  Sodium 135 - 145 mmol/L 140 137 140  Potassium 3.5 - 5.1 mmol/L 3.3(L) 4.0 3.4(L)  Chloride 98 - 111 mmol/L 108 107 109  CO2 22 - 32 mmol/L 21(L) 21(L) 22  Calcium 8.9 - 10.3 mg/dL 8.5(L) 8.5(L) 8.4(L)  Total Protein 6.5 - 8.1 g/dL 5.5(L) 5.6(L) 5.2(L)  Total Bilirubin 0.3 - 1.2 mg/dL 0.5 0.7 0.7  Alkaline Phos 38 - 126 U/L 41 41 40  AST 15 - 41 U/L 12(L) 12(L) 12(L)  ALT 0 - 44 U/L 13 12 11     Lab Results  Component Value Date   WBC 1.9 (L) 03/18/2020   HGB 10.6 (L) 03/18/2020   HCT 33.7 (L) 03/18/2020   MCV 85.3 03/18/2020   PLT 123 (L) 03/18/2020   NEUTROABS 1.4 (L) 03/18/2020    CT ABDOMEN PELVIS W CONTRAST  Result Date: 03/13/2020 CLINICAL DATA:  Abdominal pain and fever, history of squamous cell EXAM: CT ABDOMEN AND PELVIS WITH CONTRAST TECHNIQUE: Multidetector CT imaging of the abdomen and pelvis was performed using the standard protocol following bolus  administration of intravenous contrast. CONTRAST:  154mL OMNIPAQUE IOHEXOL 300 MG/ML  SOLN COMPARISON:  September 04, 2019 FINDINGS: Lower chest: The visualized heart size within normal limits. There is a trace pericardial effusion. No hiatal hernia. The visualized portions of the lungs are clear. Hepatobiliary: There is diffuse low density seen throughout the liver parenchyma. No focal hepatic lesion is seen. No intra or extrahepatic biliary ductal dilatation. The main portal vein is patent. No evidence of calcified gallstones, gallbladder wall thickening or biliary dilatation. Pancreas: Unremarkable. No pancreatic ductal dilatation or surrounding inflammatory changes. Spleen: Normal in size without focal abnormality. Adrenals/Urinary Tract: Both adrenal glands appear normal. There is a punctate calcifications seen in the upper pole of the right kidney. There is areas of scarring seen within the upper pole. A 1 cm low-density lesion seen in the lower pole the right kidney. No left-sided renal or collecting system calculi. There is mild diffuse bladder wall thickening. Stomach/Bowel: The stomach and proximal small bowel are unremarkable. There is a long segment of ileum with diffuse wall thickening surrounding fat stranding changes to the terminal ileum. There is also wall thickening seen around the distal cecal pole. Mild wall thickening with surrounding fat stranding changes are seen around the sigmoid colon extending to the sigmoid rectal junction. No loculated fluid collections or free air however  are noted. Vascular/Lymphatic: There are no enlarged mesenteric, retroperitoneal, or pelvic lymph nodes. Scattered aortic atherosclerotic calcifications are seen without aneurysmal dilatation. Reproductive: The patient is status post hysterectomy. No adnexal masses or collections seen. Other: A small amount of free fluid is seen within the deep cul-de-sac. Musculoskeletal: No acute or significant osseous findings.  IMPRESSION: Diffuse wall thickening with inflammatory changes involving a long segment of distal ileum, cecum, and sigmoid colon likely represents enteritis/colitis. This could be due to infectious, or inflammatory nature, not thought to represent ischemic colitis. Hepatic steatosis Trace pericardial effusion Small free fluid in the deep pelvis Electronically Signed   By: Prudencio Pair M.D.   On: 03/13/2020 21:30   IR PICC PLACEMENT RIGHT <5 YRS INC IMG GUIDE  Result Date: 03/03/2020 INDICATION: Patient with history of squamous cell carcinoma of the anus; central venous access requested for chemotherapy. EXAM: RIGHT UPPER EXTREMITY PICC LINE PLACEMENT WITH ULTRASOUND AND FLUOROSCOPIC GUIDANCE MEDICATIONS: 1% lidocaine to skin and subcutaneous tissue ANESTHESIA/SEDATION: None FLUOROSCOPY TIME:  Fluoroscopy Time: 54  seconds (6 mGy). COMPLICATIONS: None immediate. PROCEDURE: The patient was advised of the possible risks and complications and agreed to undergo the procedure. The patient was then brought to the angiographic suite for the procedure. The right arm was prepped with chlorhexidine, draped in the usual sterile fashion using maximum barrier technique (cap and mask, sterile gown, sterile gloves, large sterile sheet, hand hygiene and cutaneous antisepsis) and infiltrated locally with 1% Lidocaine. Ultrasound demonstrated patency of the right basilic vein, and this was documented with an image. Under real-time ultrasound guidance, this vein was accessed with a 21 gauge micropuncture needle and image documentation was performed. A 0.018 wire was introduced in to the vein. Over this, a 5 [place] single lumen power-injectable PICC was advanced to the lower SVC/right atrial junction. Fluoroscopy during the procedure and fluoro spot radiograph confirms appropriate catheter position. The catheter was flushed and covered with a sterile dressing. Catheter length: 40 cm IMPRESSION: Successful right arm Power PICC line  placement with ultrasound and fluoroscopic guidance. The catheter is ready for use. Read by: Rowe Robert, PA-C Electronically Signed   By: Corrie Mckusick D.O.   On: 03/03/2020 11:20    ASSESSMENT AND PLAN: 1.  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  Enteritis/colitis, radiation-induced 3.  Urinary tract infection 4.  Pancytopenia secondary to recent chemotherapy 5.  Hypokalemia 6.  History of atrial fibrillation 7.  Hyperlipidemia 8.  Diabetes mellitus  -The patient has completed concurrent chemoradiation (cancelled last 4 sessions of radiation).  She has developed nausea, vomiting, diarrhea due to chemotherapy and radiation induced colitis.  Nausea and vomiting have resolved and she is tolerating a soft diet.  She continues to have diarrhea.  Recommend continued supportive care with Imodium and Questran.  Can add Lomotil if diarrhea not controlled with Imodium and Questran.  Recommend decreasing IV fluid rate today and I have encouraged her to take more liquids by mouth. -Continue Bactrim for UTI. -CBC has been reviewed and WBC slowly increasing.  No need for Granix at this time.  Hemoglobin remained stable.  Platelets overall stable.  Continue to monitor closely. -Continue potassium supplementation per hospitalist. -Continue management of chronic medical conditions per hospitalist.    LOS: 5 days   Mikey Bussing, DNP, AGPCNP-BC, AOCNP 03/18/20   Addendum  I have seen the patient, examined her. I agree with the assessment and and plan and have edited the notes.   Pt has made some  progress over the weekend, and advanced her diet. She however still has some diarrhea and complains of low abdominal pain today, OK to add lomotil if needed continue pain management and supportive care. Hope she will be well enough to go home in the next few days. We discussed the recovery course, which may take a few months. She voiced understanding.  I will follow-up as needed when she is  in house.   Truitt Merle  03/18/2020

## 2020-03-18 NOTE — Progress Notes (Signed)
PROGRESS NOTE    Melinda [name]  ZOX:[id number] DOB: [birth year] DOA: 03/13/2020 PCP: Crist Infante, MD    Brief Narrative:  59 y.o. female with medical history significant of recurrent anal cancer. Presents with 1 week of N/V/ab pain. She notes that she had her last chemo/radiation treatments on Friday (03/07/20). Since that day, she had had increasing N/V/ab and groin pain. She followed up with her oncologist on 10/4. At that time, there was concern for a UTI. She was given macrobid. She was unable to tolerate that medication. So she returned to the clinic today w/ the above complains plus a concern for fever. It was recommended that she come to the hospital. With respect to her ab pain, she states that it's global, but worse in the RLQ. It's sharp and intermittent when it happens. With respect to her groin pain, she notes that it's a burning sensation with urination and bowel movement. With respect to her N/V; she is unable to tolerate solids and medications. Liquids have been increasing difficult to keep down. She denies any other aggravating or alleviating factors. She denies any other treatments  Assessment & Plan:   Active Problems:   Intractable nausea and vomiting  Intractable N/V with abd pain secondary to radiation induced colitis - CT abd reviewed. Findings notable for diffuse wall thickening with inflammatory changes involving a long segment of distal ileum, cecum, and sigmoid colon which likely represents enteritis/colitis. Suspect radiation induced  -Continue PRN anti-emetics -continues to complain of abd pain but continues to ask for advancing diet. Educated patient to avoid advancing beyond what her body can tolerate -Remains with limited PO intake, thus will continue basal IVF  UTI  - UCx from 10/4 shows e coli and MRSA, both sensitive to bactrim  - she has an extensive allergy list.  - Was on cipro and vanc initially, now on PO bactrim based on culture results. 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 Burr Medico. Recommendation to continue supportive care. Granix if ANC<500 -Serial WBC is gradually trending up.  -Will repeat CBC in AM  Hx of a fib -Home propranolol currently on hold for soft BP -Currently rate controlled.   Excoriations of the anus and labia - Likely secondary radiation treatment -Appreciate WOC input  Hypokalemia     - K+ low at 3.3 -Will replace -repeat bmet in AM  Pancytopenia - secondary to chemo/radiation - No evidence of active bleed -remains hemodynamically stable thus far -Recheck cbc w/ diff in AM  HLD - on Crestor prior to admit -Continue crestor as tolerated  DM2 - Holding home metformin while in hospital - Continue SSI coverage -Glycemic trends remain stable  Diarrhea - continue imodium as needed -also on PRN cholestyramine -Likely secondary to above colitis   DVT prophylaxis: Lovenox subq Code Status: Full Family Communication: Pt in room, family not at bedside  Status is: Inpatient  Remains inpatient appropriate because:IV treatments appropriate due to intensity of illness or inability to take PO   Dispo: The patient is from: Home              Anticipated d/c is to: Home              Anticipated d/c date is: 2 days              Patient currently is not medically stable to d/c.  Consultants:   Oncology  Procedures:     Antimicrobials: Anti-infectives (From admission, onward)   Start     Dose/Rate Route Frequency Ordered Stop   03/16/20 1530  sulfamethoxazole-trimethoprim (BACTRIM DS) 800-160 MG per tablet 1 tablet        1 tablet Oral Every 12 hours 03/16/20 1443     03/13/20 2200  vancomycin (VANCOREADY) IVPB 750 mg/150 mL  Status:  Discontinued        750 mg 150 mL/hr over 60 Minutes Intravenous 2 times daily 03/13/20 1639 03/16/20 1443   03/13/20 1700  ciprofloxacin  (CIPRO) IVPB 400 mg  Status:  Discontinued        400 mg 200 mL/hr over 60 Minutes Intravenous Every 12 hours 03/13/20 1538 03/16/20 1443   03/13/20 1700  vancomycin (VANCOREADY) IVPB 750 mg/150 mL        750 mg 150 mL/hr over 60 Minutes Intravenous  Once 03/13/20 1639 03/13/20 1958      Subjective: Still complaining of abd pain but still wanting to eat solid food  Objective: Vitals:   03/17/20 1533 03/17/20 2111 03/18/20 0610 03/18/20 1222  BP: 105/69 118/64 (!) 103/51 101/65  Pulse: 87 80 91 86  Resp: 17 18 17 20   Temp: 98.2 F (36.8 C) 98.8 F (37.1 C) 98.4 F (36.9 C) 98.4 F (36.9 C)  TempSrc: Oral Oral Oral Oral  SpO2: 94% 98% 98% 95%  Weight:      Height:       No intake or output data in the 24 hours ending 03/18/20 1502 Filed Weights   03/15/20 0606 03/16/20 1844  Weight: 71.7 kg 68.5 kg    Examination: General exam: Awake, laying in bed, in nad Respiratory system: Normal respiratory effort, no wheezing Cardiovascular system: regular rate, s1, s2 Gastrointestinal system: Soft, nondistended, positive BS Central nervous system: CN2-12 grossly intact, strength intact Extremities: Perfused, no clubbing Skin: Normal skin turgor, no notable skin lesions seen Psychiatry: Mood normal // no visual hallucinations   Data Reviewed: I have personally reviewed following labs and imaging studies  CBC: Recent Labs  Lab 03/13/20 1123 03/13/20 1742 03/14/20 0523 03/15/20 0657 03/16/20 0704 03/17/20 0610 03/18/20 0559  WBC 2.6*   < > 1.6* 1.6* 1.4* 1.8* 1.9*  NEUTROABS 1.8  --   --  1.0* 0.9* 1.2* 1.4*  HGB 10.8*   < > 9.5* 10.1* 9.9* 10.6* 10.6*  HCT 32.7*   < > 29.6* 32.0* 31.4* 34.4* 33.7*  MCV 79.2*   < > 83.1 84.9 84.9 85.6 85.3  PLT 128*   < > 129* 123* 122* 143* 123*   < > = values in this interval not displayed.   Basic Metabolic Panel: Recent Labs  Lab 03/13/20 1742 03/13/20 1742 03/14/20 0523 03/15/20 0657 03/16/20 0704 03/17/20 0610  03/18/20 0559  NA 137   < > 138 139 140 137 140  K 3.4*   < > 3.5 3.5 3.4* 4.0 3.3*  CL 106   < > 107 107 109 107 108  CO2 23   < > 24 24 22  21* 21*  GLUCOSE 174*   < > 139* 114* 124* 106* 113*  BUN 13   < > 10 10 7* 5* 6*  CREATININE 0.59   < > 0.57 0.70 0.66 0.80 0.92  CALCIUM 8.1*   < > 8.3* 8.4* 8.4* 8.5* 8.5*  MG 1.6*  --   --  2.3  --   --   --    < > =  values in this interval not displayed.   GFR: Estimated Creatinine Clearance: 60.1 mL/min (by C-G formula based on SCr of 0.92 mg/dL). Liver Function Tests: Recent Labs  Lab 03/14/20 0523 03/15/20 0657 03/16/20 0704 03/17/20 0610 03/18/20 0559  AST 9* 15 12* 12* 12*  ALT 10 12 11 12 13   ALKPHOS 41 42 40 41 41  BILITOT 0.5 0.6 0.7 0.7 0.5  PROT 5.5* 5.6* 5.2* 5.6* 5.5*  ALBUMIN 2.7* 2.8* 2.7* 2.8* 2.7*   No results for input(s): LIPASE, AMYLASE in the last 168 hours. No results for input(s): AMMONIA in the last 168 hours. Coagulation Profile: No results for input(s): INR, PROTIME in the last 168 hours. Cardiac Enzymes: No results for input(s): CKTOTAL, CKMB, CKMBINDEX, TROPONINI in the last 168 hours. BNP (last 3 results) No results for input(s): PROBNP in the last 8760 hours. HbA1C: No results for input(s): HGBA1C in the last 72 hours. CBG: Recent Labs  Lab 03/17/20 1204 03/17/20 1648 03/17/20 2214 03/18/20 0745 03/18/20 1219  GLUCAP 87 110* 142* 102* 114*   Lipid Profile: No results for input(s): CHOL, HDL, LDLCALC, TRIG, CHOLHDL, LDLDIRECT in the last 72 hours. Thyroid Function Tests: No results for input(s): TSH, T4TOTAL, FREET4, T3FREE, THYROIDAB in the last 72 hours. Anemia Panel: No results for input(s): VITAMINB12, FOLATE, FERRITIN, TIBC, IRON, RETICCTPCT in the last 72 hours. Sepsis Labs: No results for input(s): PROCALCITON, LATICACIDVEN in the last 168 hours.  Recent Results (from the past 240 hour(s))  Culture, Urine     Status: Abnormal   Collection Time: 03/10/20  3:06 PM   Specimen:  Urine, Clean Catch  Result Value Ref Range Status   Specimen Description   Final    URINE, CLEAN CATCH Performed at [facility], [address] [place], [place] [address]    Special Requests   Final    NONE Performed at [facility], [place] [address], [place],  [address]    Culture (A)  Final    10,000 COLONIES/mL ESCHERICHIA COLI >=100,000 COLONIES/mL METHICILLIN RESISTANT STAPHYLOCOCCUS AUREUS    Report Status 03/12/2020 FINAL  Final   Organism ID, Bacteria ESCHERICHIA COLI (A)  Final   Organism ID, Bacteria METHICILLIN RESISTANT STAPHYLOCOCCUS AUREUS (A)  Final      Susceptibility   Escherichia coli - MIC*    AMPICILLIN <=2 SENSITIVE Sensitive     CEFAZOLIN <=4 SENSITIVE Sensitive     CEFTRIAXONE <=0.25 SENSITIVE Sensitive     CIPROFLOXACIN <=0.25 SENSITIVE Sensitive     GENTAMICIN <=1 SENSITIVE Sensitive     IMIPENEM <=0.25 SENSITIVE Sensitive     NITROFURANTOIN <=16 SENSITIVE Sensitive     TRIMETH/SULFA <=20 SENSITIVE Sensitive     AMPICILLIN/SULBACTAM <=2 SENSITIVE Sensitive     PIP/TAZO <=4 SENSITIVE Sensitive     * 10,000 COLONIES/mL ESCHERICHIA COLI   Methicillin resistant staphylococcus aureus - MIC*    CIPROFLOXACIN >=8 RESISTANT Resistant     GENTAMICIN <=0.5 SENSITIVE Sensitive     NITROFURANTOIN <=16 SENSITIVE Sensitive     OXACILLIN >=4 RESISTANT Resistant     TETRACYCLINE <=1 SENSITIVE Sensitive     VANCOMYCIN 1 SENSITIVE Sensitive     TRIMETH/SULFA <=10 SENSITIVE Sensitive     CLINDAMYCIN <=0.25 SENSITIVE Sensitive     RIFAMPIN <=0.5 SENSITIVE Sensitive     Inducible Clindamycin NEGATIVE Sensitive     * >=100,000 COLONIES/mL METHICILLIN RESISTANT STAPHYLOCOCCUS AUREUS  Culture, blood (routine x 2)     Status: None  Collection Time: 03/10/20  3:54 PM   Specimen: BLOOD  Result Value Ref Range Status   Specimen Description   Final    BLOOD LEFT ARM Performed at [facility],  [address], [place], [place] [address]    Special Requests   Final    BOTTLES DRAWN AEROBIC AND ANAEROBIC Blood Culture adequate volume   Culture   Final    NO GROWTH 5 DAYS Performed at [facility], [place] [address] [place], [place] [address]    Report Status 03/15/2020 FINAL  Final  Respiratory Panel by RT PCR (Flu A&B, Covid) - Nasopharyngeal Swab     Status: None   Collection Time: 03/16/20  7:52 AM   Specimen: Nasopharyngeal Swab  Result Value Ref Range Status   SARS Coronavirus 2 by RT PCR NEGATIVE NEGATIVE Final    Comment: (NOTE) SARS-CoV-2 target nucleic acids are NOT DETECTED.  The SARS-CoV-2 RNA is generally detectable in upper respiratoy specimens during the acute phase of infection. The lowest concentration of SARS-CoV-2 viral copies this assay can detect is 131 copies/mL. A negative result does not preclude SARS-Cov-2 infection and should not be used as the sole basis for treatment or other patient management decisions. A negative result may occur with  improper specimen collection/handling, submission of specimen other than nasopharyngeal swab, presence of viral mutation(s) within the areas targeted by this assay, and inadequate number of viral copies (<131 copies/mL). A negative result must be combined with clinical observations, patient history, and epidemiological information. The expected result is Negative.  Fact Sheet for Patients:  PinkCheek.be  Fact Sheet for Healthcare Providers:  GravelBags.it  This test is no t yet approved or cleared by the [place] FDA and  has been authorized for detection and/or diagnosis of SARS-CoV-2 by FDA under an Emergency Use Authorization (EUA). This EUA will remain  in effect (meaning this test can be used) for the duration of the COVID-19 declaration under Section 564(b)(1) of the Act, 21 U.S.C. section 360bbb-3(b)(1), unless the authorization is  terminated or revoked sooner.     Influenza A by PCR NEGATIVE NEGATIVE Final   Influenza B by PCR NEGATIVE NEGATIVE Final    Comment: (NOTE) The Xpert Xpress SARS-CoV-2/FLU/RSV assay is intended as an aid in  the diagnosis of influenza from Nasopharyngeal swab specimens and  should not be used as a sole basis for treatment. Nasal washings and  aspirates are unacceptable for Xpert Xpress SARS-CoV-2/FLU/RSV  testing.  Fact Sheet for Patients: PinkCheek.be  Fact Sheet for Healthcare Providers: GravelBags.it  This test is not yet approved or cleared by the [place] FDA and  has been authorized for detection and/or diagnosis of SARS-CoV-2 by  FDA under an Emergency Use Authorization (EUA). This EUA will remain  in effect (meaning this test can be used) for the duration of the  Covid-19 declaration under Section 564(b)(1) of the Act, 21  U.S.C. section 360bbb-3(b)(1), unless the authorization is  terminated or revoked. Performed at [facility], [place] [address] [place],  [address]      Radiology Studies: No results found.  Scheduled Meds: . clorazepate  7.5 mg Oral BID  . enoxaparin (LOVENOX) injection  40 mg Subcutaneous Q24H  . feeding supplement (ENSURE ENLIVE)  237 mL Oral Q24H  . FLUoxetine  20 mg Oral q morning - 10a  . insulin aspart  0-15 Units Subcutaneous TID WC  . insulin aspart  0-5 Units Subcutaneous QHS  .  potassium chloride  40 mEq Oral BID  . protein supplement  1 Scoop Oral TID WC  . rosuvastatin  5 mg Oral QPM  . sulfamethoxazole-trimethoprim  1 tablet Oral Q12H   Continuous Infusions: . sodium chloride 100 mL/hr at 03/18/20 1323     LOS: 5 days   Marylu Lund, MD [facility] Pager On Amion  If 7PM-7AM, please contact night-coverage 03/18/2020, 3:02 PM

## 2020-03-19 LAB — COMPREHENSIVE METABOLIC PANEL
ALT: 11 U/L (ref 0–44)
AST: 11 U/L — ABNORMAL LOW (ref 15–41)
Albumin: 2.7 g/dL — ABNORMAL LOW (ref 3.5–5.0)
Alkaline Phosphatase: 43 U/L (ref 38–126)
Anion gap: 6 (ref 5–15)
BUN: <5 mg/dL — ABNORMAL LOW (ref 8–23)
CO2: 23 mmol/L (ref 22–32)
Calcium: 8.4 mg/dL — ABNORMAL LOW (ref 8.9–10.3)
Chloride: 106 mmol/L (ref 98–111)
Creatinine, Ser: 0.9 mg/dL (ref 0.44–1.00)
GFR, Estimated: >60 mL/min (ref >60)
Glucose, Bld: 93 mg/dL (ref 70–99)
Potassium: 4.2 mmol/L (ref 3.5–5.1)
Sodium: 135 mmol/L (ref 135–145)
Total Bilirubin: 0.3 mg/dL (ref 0.3–1.2)
Total Protein: 5.5 g/dL — ABNORMAL LOW (ref 6.5–8.1)

## 2020-03-19 LAB — CBC WITH DIFFERENTIAL/PLATELET
Abs Immature Granulocytes: 0.1 10*3/uL — ABNORMAL HIGH (ref 0.00–0.07)
Basophils Absolute: 0 10*3/uL (ref 0.0–0.1)
Basophils Relative: 0 %
Eosinophils Absolute: 0.1 10*3/uL (ref 0.0–0.5)
Eosinophils Relative: 2 %
HCT: 33.5 % — ABNORMAL LOW (ref 36.0–46.0)
Hemoglobin: 10.5 g/dL — ABNORMAL LOW (ref 12.0–15.0)
Immature Granulocytes: 4 %
Lymphocytes Relative: 8 %
Lymphs Abs: 0.2 10*3/uL — ABNORMAL LOW (ref 0.7–4.0)
MCH: 26.9 pg (ref 26.0–34.0)
MCHC: 31.3 g/dL (ref 30.0–36.0)
MCV: 85.7 fL (ref 80.0–100.0)
Monocytes Absolute: 0.4 10*3/uL (ref 0.1–1.0)
Monocytes Relative: 16 %
Neutro Abs: 1.6 10*3/uL — ABNORMAL LOW (ref 1.7–7.7)
Neutrophils Relative %: 70 %
Platelets: 130 10*3/uL — ABNORMAL LOW (ref 150–400)
RBC: 3.91 MIL/uL (ref 3.87–5.11)
RDW: 17.2 % — ABNORMAL HIGH (ref 11.5–15.5)
WBC: 2.3 10*3/uL — ABNORMAL LOW (ref 4.0–10.5)
nRBC: 0 % (ref 0.0–0.2)

## 2020-03-19 LAB — GLUCOSE, CAPILLARY
Glucose-Capillary: 102 mg/dL — ABNORMAL HIGH (ref 70–99)
Glucose-Capillary: 96 mg/dL (ref 70–99)
Glucose-Capillary: 98 mg/dL (ref 70–99)
Glucose-Capillary: 134 mg/dL — ABNORMAL HIGH (ref 70–99)

## 2020-03-19 LAB — MAGNESIUM: Magnesium: 2.2 mg/dL (ref 1.7–2.4)

## 2020-03-19 MED ORDER — DIPHENOXYLATE-ATROPINE 2.5-0.025 MG PO TABS: 1.0000 | ORAL_TABLET | Freq: Four times a day (QID) | ORAL | Status: DC | PRN

## 2020-03-19 NOTE — Progress Notes (Signed)
PROGRESS NOTE   Melinda [name]  LNL:[id number]    DOB: 10/10/1960    DOA: 03/13/2020  PCP: Crist Infante, MD   I have briefly reviewed patients previous medical records in [facility].  Chief complaints: Nausea and vomiting   Brief Narrative:  59 year old female with PMH of recurrent anal cancer, presented with 1 week history of nausea, vomiting and abdominal pain.  She reported that she had her last chemo/radiation on 03/07/2020 and since that day she had increasing nausea, vomiting, abdominal and groin pain.  She followed up with her oncologist on 10/4 when there was concern for a UTI and she was given Macrobid but was unable to tolerate it.  She return to clinic on day of admission with above complaints plus a concern for fever.  She was directed to the hospital for admission.  She was unable to tolerate solids or her medications.   Assessment & Plan:  Active Problems:   Intractable nausea and vomiting   Intractable N/V with abd pain secondary to radiation induced colitis - CT abd reviewed. Findings notable for diffuse wall thickening with inflammatory changes involving a long segment of distal ileum, cecum, and sigmoid colon which likely represents enteritis/colitis. Suspect radiation induced -Continue PRN anti-emetics -Remains with limited PO intake, thus will continue basal IVF  -Yesterday upon her request, diet was advanced from full liquids to soft diet which she developed worsening abdominal pain and had to be promptly downgraded to full liquids which she is tolerating without nausea or vomiting.  Today again after discussing with her, advancing for trial of soft diet and advised her to go slow with frequent small bites/meals rather than large intake at a single time.  If she is unable to tolerate this then may have to downgrade to full liquids again and leave her on that diet for several days, even DC on same with gradual advancement to soft diet at home as tolerated.  She  verbalizes understanding.  UTI - UCx from 10/4 shows e coli and MRSA, both sensitive to bactrim - she has an extensive allergy list.  - Was on cipro and vanc initially, now on PO bactrim based on culture results. Pt tolerating abx -E. coli was sensitive to Cipro and Bactrim and has completed 6 days course for this, MRSA was resistant to Cipro but sensitive to Bactrim and has completed 3 days course for this.  Would presume is treated and stopped all antibiotics which may also be contributing to her GI symptoms.  Monitor closely.  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 Burr Medico. Recommendation to continue supportive care. Granix if ANC<500 -Serial WBC is gradually trending up.  -Follow daily CBCs  Hx of a fib -Home propranolol currently on hold for soft BP -Currently rate controlled.   Excoriations of the anus and labia - Likely secondary radiation treatment -Appreciate WOC input  Hypokalemia -Replaced  Pancytopenia - secondary to chemo/radiation - No evidence of active bleed -Improving.  Follow CBC  HLD - on Crestor prior to admit -Continue crestor as tolerated  DM2 - Holding home metformin while in hospital - Continue SSI coverage -Glycemic trends remain stable  Diarrhea - continue imodium as needed -also on PRN cholestyramine -Likely secondary to above colitis -Seems to be gradually improving.  Continue as needed Imodium/Lomotil added by oncology.  Body mass index is 24.37 kg/m.  Nutritional Status Nutrition  Problem: Increased nutrient needs Etiology: cancer and cancer related treatments Signs/Symptoms: estimated needs Interventions: Ensure Enlive (each supplement provides 350kcal and 20 grams of protein), Refer to RD note for recommendations  DVT prophylaxis: enoxaparin (LOVENOX) injection 40 mg Start: 03/13/20 2200     Code  Status: Full Code  Family Communication:  Disposition:  Status is: Inpatient  Remains inpatient appropriate because:Inpatient level of care appropriate due to severity of illness   Dispo: The patient is from: Home              Anticipated d/c is to: Home              Anticipated d/c date is: 2 days              Patient currently is not medically stable to d/c.        Consultants:   Medical oncology.  Procedures:   None.  Antimicrobials:    Anti-infectives (From admission, onward)   Start     Dose/Rate Route Frequency Ordered Stop   03/16/20 1530  sulfamethoxazole-trimethoprim (BACTRIM DS) 800-160 MG per tablet 1 tablet  Status:  Discontinued        1 tablet Oral Every 12 hours 03/16/20 1443 03/19/20 0929   03/13/20 2200  vancomycin (VANCOREADY) IVPB 750 mg/150 mL  Status:  Discontinued        750 mg 150 mL/hr over 60 Minutes Intravenous 2 times daily 03/13/20 1639 03/16/20 1443   03/13/20 1700  ciprofloxacin (CIPRO) IVPB 400 mg  Status:  Discontinued        400 mg 200 mL/hr over 60 Minutes Intravenous Every 12 hours 03/13/20 1538 03/16/20 1443   03/13/20 1700  vancomycin (VANCOREADY) IVPB 750 mg/150 mL        750 mg 150 mL/hr over 60 Minutes Intravenous  Once 03/13/20 1639 03/13/20 1958        Subjective:  Reports that she developed worsening abdominal pain with soft diet yesterday and was downgraded to full liquids with improvement in symptoms.  Mild abdominal soreness.  Able to tolerate liquid diet without nausea or vomiting.  Diarrhea has improved.  Dysuria has resolved.  Objective:   Vitals:   03/18/20 1222 03/18/20 2223 03/19/20 0623 03/19/20 1528  BP: 101/65 116/67 103/62 102/60  Pulse: 86 85 96 93  Resp: 20 18 16 15   Temp: 98.4 F (36.9 C) 98.6 F (37 C) 99 F (37.2 C) 99.1 F (37.3 C)  TempSrc: Oral Oral Oral Oral  SpO2: 95% 95% 96% 97%  Weight:      Height:        General exam: Middle-age female, moderately built and nourished sitting up  comfortably in bed without distress. Respiratory system: Clear to auscultation. Respiratory effort normal. Cardiovascular system: S1 & S2 heard, RRR. No JVD, murmurs, rubs, gallops or clicks. No pedal edema. Gastrointestinal system: Abdomen is nondistended, soft and nontender. No organomegaly or masses felt. Normal bowel sounds heard. Central nervous system: Alert and oriented. No focal neurological deficits. Extremities: Symmetric 5 x 5 power. Skin: No rashes, lesions or ulcers Psychiatry: Judgement and insight appear normal. Mood & affect appropriate.     Data Reviewed:   I have personally reviewed following labs and imaging studies   CBC: Recent Labs  Lab 03/17/20 0610 03/18/20 0559 03/19/20 0544  WBC 1.8* 1.9* 2.3*  NEUTROABS 1.2* 1.4* 1.6*  HGB 10.6* 10.6* 10.5*  HCT 34.4* 33.7* 33.5*  MCV 85.6 85.3 85.7  PLT 143* 123* 130*  Basic Metabolic Panel: Recent Labs  Lab 03/13/20 1742 03/14/20 0523 03/15/20 0657 03/16/20 0704 03/17/20 0610 03/18/20 0559 03/19/20 0544  NA 137   < > 139   < > 137 140 135  K 3.4*   < > 3.5   < > 4.0 3.3* 4.2  CL 106   < > 107   < > 107 108 106  CO2 23   < > 24   < > 21* 21* 23  GLUCOSE 174*   < > 114*   < > 106* 113* 93  BUN 13   < > 10   < > 5* 6* <5*  CREATININE 0.59   < > 0.70   < > 0.80 0.92 0.90  CALCIUM 8.1*   < > 8.4*   < > 8.5* 8.5* 8.4*  MG 1.6*  --  2.3  --   --   --  2.2   < > = values in this interval not displayed.    Liver Function Tests: Recent Labs  Lab 03/17/20 0610 03/18/20 0559 03/19/20 0544  AST 12* 12* 11*  ALT 12 13 11   ALKPHOS 41 41 43  BILITOT 0.7 0.5 0.3  PROT 5.6* 5.5* 5.5*  ALBUMIN 2.8* 2.7* 2.7*    CBG: Recent Labs  Lab 03/19/20 0815 03/19/20 1153 03/19/20 1641  GLUCAP 102* 96 98    Microbiology Studies:   Recent Results (from the past 240 hour(s))  Culture, Urine     Status: Abnormal   Collection Time: 03/10/20  3:06 PM   Specimen: Urine, Clean Catch  Result Value Ref Range  Status   Specimen Description   Final    URINE, CLEAN CATCH Performed at [facility], [place] [address], [place], [place] [address]    Special Requests   Final    NONE Performed at [facility], [place] [address], [place], [place] [address]    Culture (A)  Final    10,000 COLONIES/mL ESCHERICHIA COLI >=100,000 COLONIES/mL METHICILLIN RESISTANT STAPHYLOCOCCUS AUREUS    Report Status 03/12/2020 FINAL  Final   Organism ID, Bacteria ESCHERICHIA COLI (A)  Final   Organism ID, Bacteria METHICILLIN RESISTANT STAPHYLOCOCCUS AUREUS (A)  Final      Susceptibility   Escherichia coli - MIC*    AMPICILLIN <=2 SENSITIVE Sensitive     CEFAZOLIN <=4 SENSITIVE Sensitive     CEFTRIAXONE <=0.25 SENSITIVE Sensitive     CIPROFLOXACIN <=0.25 SENSITIVE Sensitive     GENTAMICIN <=1 SENSITIVE Sensitive     IMIPENEM <=0.25 SENSITIVE Sensitive     NITROFURANTOIN <=16 SENSITIVE Sensitive     TRIMETH/SULFA <=20 SENSITIVE Sensitive     AMPICILLIN/SULBACTAM <=2 SENSITIVE Sensitive     PIP/TAZO <=4 SENSITIVE Sensitive     * 10,000 COLONIES/mL ESCHERICHIA COLI   Methicillin resistant staphylococcus aureus - MIC*    CIPROFLOXACIN >=8 RESISTANT Resistant     GENTAMICIN <=0.5 SENSITIVE Sensitive     NITROFURANTOIN <=16 SENSITIVE Sensitive     OXACILLIN >=4 RESISTANT Resistant     TETRACYCLINE <=1 SENSITIVE Sensitive     VANCOMYCIN 1 SENSITIVE Sensitive     TRIMETH/SULFA <=10 SENSITIVE Sensitive     CLINDAMYCIN <=0.25 SENSITIVE Sensitive     RIFAMPIN <=0.5 SENSITIVE Sensitive     Inducible Clindamycin NEGATIVE Sensitive     * >=100,000 COLONIES/mL METHICILLIN RESISTANT STAPHYLOCOCCUS AUREUS  Culture, blood (routine x 2)     Status: None   Collection Time: 03/10/20  3:54 PM  Specimen: BLOOD  Result Value Ref Range Status   Specimen Description   Final    BLOOD LEFT ARM Performed at [facility], [place] [address], [place], [place]  [address]    Special Requests   Final    BOTTLES DRAWN AEROBIC AND ANAEROBIC Blood Culture adequate volume   Culture   Final    NO GROWTH 5 DAYS Performed at [facility], [place] [address], [place], [place] [address]    Report Status 03/15/2020 FINAL  Final  Respiratory Panel by RT PCR (Flu A&B, Covid) - Nasopharyngeal Swab     Status: None   Collection Time: 03/16/20  7:52 AM   Specimen: Nasopharyngeal Swab  Result Value Ref Range Status   SARS Coronavirus 2 by RT PCR NEGATIVE NEGATIVE Final    Comment: (NOTE) SARS-CoV-2 target nucleic acids are NOT DETECTED.  The SARS-CoV-2 RNA is generally detectable in upper respiratoy specimens during the acute phase of infection. The lowest concentration of SARS-CoV-2 viral copies this assay can detect is 131 copies/mL. A negative result does not preclude SARS-Cov-2 infection and should not be used as the sole basis for treatment or other patient management decisions. A negative result may occur with  improper specimen collection/handling, submission of specimen other than nasopharyngeal swab, presence of viral mutation(s) within the areas targeted by this assay, and inadequate number of viral copies (<131 copies/mL). A negative result must be combined with clinical observations, patient history, and epidemiological information. The expected result is Negative.  Fact Sheet for Patients:  PinkCheek.be  Fact Sheet for Healthcare Providers:  GravelBags.it  This test is no t yet approved or cleared by the [place] FDA and  has been authorized for detection and/or diagnosis of SARS-CoV-2 by FDA under an Emergency Use Authorization (EUA). This EUA will remain  in effect (meaning this test can be used) for the duration of the COVID-19 declaration under Section 564(b)(1) of the Act, 21 U.S.C. section 360bbb-3(b)(1), unless the authorization is terminated or revoked sooner.      Influenza A by PCR NEGATIVE NEGATIVE Final   Influenza B by PCR NEGATIVE NEGATIVE Final    Comment: (NOTE) The Xpert Xpress SARS-CoV-2/FLU/RSV assay is intended as an aid in  the diagnosis of influenza from Nasopharyngeal swab specimens and  should not be used as a sole basis for treatment. Nasal washings and  aspirates are unacceptable for Xpert Xpress SARS-CoV-2/FLU/RSV  testing.  Fact Sheet for Patients: PinkCheek.be  Fact Sheet for Healthcare Providers: GravelBags.it  This test is not yet approved or cleared by the [place] FDA and  has been authorized for detection and/or diagnosis of SARS-CoV-2 by  FDA under an Emergency Use Authorization (EUA). This EUA will remain  in effect (meaning this test can be used) for the duration of the  Covid-19 declaration under Section 564(b)(1) of the Act, 21  U.S.C. section 360bbb-3(b)(1), unless the authorization is  terminated or revoked. Performed at [facility], [place] [address], [place], [place] [address]      Radiology Studies:  No results found.   Scheduled Meds:   . clorazepate  7.5 mg Oral BID  . enoxaparin (LOVENOX) injection  40 mg Subcutaneous Q24H  . feeding supplement  237 mL Oral Q24H  . FLUoxetine  20 mg Oral q morning - 10a  . insulin aspart  0-15 Units Subcutaneous TID WC  . insulin aspart  0-5 Units Subcutaneous QHS  . protein supplement  1 Scoop Oral  TID WC  . rosuvastatin  5 mg Oral QPM    Continuous Infusions:   . sodium chloride 100 mL (03/18/20 2204)     LOS: 6 days     Vernell Leep, MD, [place], [facility]. [facility]    To contact the attending provider between 7A-7P or the covering provider during after hours 7P-7A, please log into the web site [email or web address] and access using universal [place] password for that web site. If you do not have the password, please call the hospital operator.  03/19/2020, 6:40 PM

## 2020-03-20 DIAGNOSIS — K529 Noninfective gastroenteritis and colitis, unspecified: Secondary | ICD-10-CM

## 2020-03-20 LAB — BASIC METABOLIC PANEL
Anion gap: 7 (ref 5–15)
BUN: 5 mg/dL — ABNORMAL LOW (ref 8–23)
CO2: 22 mmol/L (ref 22–32)
Calcium: 8.3 mg/dL — ABNORMAL LOW (ref 8.9–10.3)
Chloride: 108 mmol/L (ref 98–111)
Creatinine, Ser: 0.75 mg/dL (ref 0.44–1.00)
GFR, Estimated: >60 mL/min (ref >60)
Glucose, Bld: 98 mg/dL (ref 70–99)
Potassium: 3.8 mmol/L (ref 3.5–5.1)
Sodium: 137 mmol/L (ref 135–145)

## 2020-03-20 LAB — GLUCOSE, CAPILLARY
Glucose-Capillary: 94 mg/dL (ref 70–99)
Glucose-Capillary: 124 mg/dL — ABNORMAL HIGH (ref 70–99)

## 2020-03-20 LAB — CBC
HCT: 31.7 % — ABNORMAL LOW (ref 36.0–46.0)
Hemoglobin: 10 g/dL — ABNORMAL LOW (ref 12.0–15.0)
MCH: 27 pg (ref 26.0–34.0)
MCHC: 31.5 g/dL (ref 30.0–36.0)
MCV: 85.4 fL (ref 80.0–100.0)
Platelets: 136 10*3/uL — ABNORMAL LOW (ref 150–400)
RBC: 3.71 MIL/uL — ABNORMAL LOW (ref 3.87–5.11)
RDW: 17.2 % — ABNORMAL HIGH (ref 11.5–15.5)
WBC: 2.3 10*3/uL — ABNORMAL LOW (ref 4.0–10.5)
nRBC: 0 % (ref 0.0–0.2)

## 2020-03-20 MED ORDER — ENSURE ENLIVE PO LIQD: 237.0000 mL | ORAL | 12 refills | Status: DC

## 2020-03-20 MED ORDER — RESOURCE INSTANT PROTEIN PO PWD PACKET: 1.0000 | Freq: Three times a day (TID) | ORAL | 0 refills | Status: DC

## 2020-03-20 NOTE — Discharge Instructions (Signed)

## 2020-03-20 NOTE — Progress Notes (Signed)
Discharge instructions provided to and reviewed with pt.  PIV discontinued.  Catheter intact and clotting within expected timeframe.  Pt transported to front lobby via wheelchair by 6E staff.   

## 2020-03-20 NOTE — Discharge Summary (Signed)
Physician Discharge Summary  Melinda [name] WGY:[id number] DOB: 30-Jun-1960  PCP: Crist Infante, MD  Admitted from: Home Discharged to: Home  Admit date: 03/13/2020 Discharge date: 03/20/2020  Recommendations for Outpatient Follow-up:    Follow-up Information    Crist Infante, MD. Schedule an appointment as soon as possible for a visit.   Specialty: Internal Medicine Contact information: [place] [address] [phone]        Fay Records, MD .   Specialty: Cardiology Contact information: [address] [place] [address] [phone]        Truitt Merle, MD Follow up on 03/26/2020.   Specialties: Hematology, Oncology Why: Labs at 11:15 AM and follow-up with MD at 11:40 AM. Contact information: [place] [address] [phone]                Home Health: None Equipment/Devices: None  Discharge Condition: Improved and stable CODE STATUS: Full Diet recommendation: Heart healthy & diabetic diet.  Discharge Diagnoses:  Active Problems:   Intractable nausea and vomiting   Brief Summary: 59 year old female with PMH of recurrent anal cancer, presented with 1 week history of nausea, vomiting and abdominal pain.  She reported that she had her last chemo/radiation on 03/07/2020 and since that day she had increasing nausea, vomiting, abdominal and groin pain.  She followed up with her oncologist on 10/4 when there was concern for a UTI and she was given Macrobid but was unable to tolerate it.  She returned to clinic on day of admission with above complaints plus a concern for fever.  She was directed to the hospital for admission.  She was unable to tolerate solids or her medications.   Assessment & Plan:   Intractable N/V with abd painsecondary to radiation induced colitis - CT abd results reviewed. Findings notable for diffuse wall thickening with inflammatory changes involving a long segment of  distal ileum, cecum, and sigmoid colon which likely represents enteritis/colitis. Suspect radiation induced -She was treated supportively with bowel rest, IV fluids, antiemetics, pain management and antidiarrheals. -She had ongoing limited and poor oral intake. -She had been advanced to soft diet 2 days back but downgraded back to full liquids after some pain episodes.  She again tried soft diet yesterday with better tolerance.  She was able to tolerate soft diet without nausea or vomiting.  Did report intermittent mild abdominal discomfort but not as bad as the day prior.  Diarrhea has also significantly improved.  She was counseled to do frequent small meals, bland diet and could alternate between soft food or full liquids such as soups depending on what she likes and tolerates.  She stated that she likes to sit up liquids frequently rather than drinking all at one time which is appropriate.  Advised her that it may take several days even a couple of weeks for her symptoms to completely resolve. -Dr. Burr Medico, Oncology saw her today and agrees with DC home on diet regimen as above and patient has prior Imodium/Lomotil and knows how to use it.  She has arranged follow-up appointment on 10/20 with repeat labs.  UTI - UCx from 10/4 shows e coli and MRSA, both sensitive to bactrim - she has an extensive allergy list.  -E. coli was sensitive to Cipro and Bactrim and has completed 6 days course for this, MRSA was resistant to Cipro but sensitive to Bactrim and has completed 3 days course for this. Completed treatment course.  Asymptomatic. -  Patient has microscopic hematuria on urine microscopy 10/4, this could be due to her UTI.  Recommend repeating urine microscopy in a couple of weeks and if this persists then may need further evaluation as outpatient.  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 Burr Medico. Recommendation to  continue supportive care. Granix if ANC<500 -Serial WBC is gradually trending up.  -As stated above, Dr. Burr Medico saw her today and has cleared her for discharge home with close outpatient follow-up with repeat labs.  Palpitations/??  Hx of a fib -Home propranolol was on hold due to soft blood pressures which have improved and this will be resumed at discharge. -Currently rate controlled.  -As per review of OP cardiology office note from December 2020, taking propranolol for palpitations.  No note of A. fib seen there.  So not entirely sure if she truly has A. fib or not.  Not on anticoagulation PTA.  Outpatient follow-up with PCP/cardiology.  Excoriations of the anus and labia - Likely secondary radiation treatment -Appreciate WOC input, continue recommendations at DC.  Hypokalemia -Replaced  Pancytopenia - secondary to chemo/radiation - No evidence of active bleed -Improving.  Follow CBC closely as outpatient.  HLD -Continue Crestor  DM2 -Metformin was held in the hospital and she was treated with SSI.  Reasonable inpatient control.  Most recent A1c 5.7.  She was counseled that she may continue to hold Metformin for a few days until her GI symptoms have consistently improved and she verbalized understanding.  Diarrhea -Management as indicated above under colitis discussion.  Body mass index is 24.37 kg/m.  Nutritional Status Nutrition Problem: Increased nutrient needs Etiology: cancer and cancer related treatments Signs/Symptoms: estimated needs Interventions: Ensure Enlive (each supplement provides 350kcal and 20 grams of protein), Refer to RD note for recommendations      Consultants:   Medical oncology.  Procedures:   None.   Discharge Instructions  Discharge Instructions    Call MD for:   Complete by: As directed    Recurrent worsening diarrhea.   Call MD for:  difficulty breathing, headache or visual disturbances   Complete by: As directed     Call MD for:  extreme fatigue   Complete by: As directed    Call MD for:  persistant dizziness or light-headedness   Complete by: As directed    Call MD for:  persistant nausea and vomiting   Complete by: As directed    Call MD for:  redness, tenderness, or signs of infection (pain, swelling, redness, odor or green/yellow discharge around incision site)   Complete by: As directed    Call MD for:  severe uncontrolled pain   Complete by: As directed    Call MD for:  temperature >100.4   Complete by: As directed    Diet - low sodium heart healthy   Complete by: As directed    Diet Carb Modified   Complete by: As directed    Frequent small meals.   Discharge wound care:   Complete by: As directed    Wound care  daily and as needed.   Comments: Apply Desitin ointment in a thin layer to affected areas at labia and perineum after cleansing. Promote airflow to affected areas.   Increase activity slowly   Complete by: As directed        Medication List    STOP taking these medications   ibuprofen 800 MG tablet Commonly known  as: ADVIL   LORazepam 0.5 MG tablet Commonly known as: ATIVAN   nitrofurantoin (macrocrystal-monohydrate) 100 MG capsule Commonly known as: Macrobid   polyvinyl alcohol 1.4 % ophthalmic solution Commonly known as: LIQUIFILM TEARS   sulfamethoxazole-trimethoprim 400-80 MG tablet Commonly known as: Bactrim     TAKE these medications   AZO BLADDER CONTROL/GO-LESS PO Take 1 tablet by mouth daily as needed (pain & irritation).   clorazepate 7.5 MG tablet Commonly known as: TRANXENE Take 7.5 mg by mouth 2 (two) times daily.   Crestor 5 MG tablet Generic drug: rosuvastatin Take 5 mg by mouth every evening.   diphenoxylate-atropine 2.5-0.025 MG tablet Commonly known as: LOMOTIL Take 1-2 tablets by mouth 4 (four) times daily as needed for diarrhea or loose stools.   esomeprazole 40 MG capsule Commonly known as: NEXIUM Take 40 mg by mouth daily at  12 noon.   feeding supplement Liqd Take 237 mLs by mouth daily. Start taking on: March 21, 2020   FLUoxetine 20 MG capsule Commonly known as: PROZAC Take 20 mg by mouth every morning.   hyoscyamine 0.125 MG Tbdp disintergrating tablet Commonly known as: ANASPAZ Place 1 tablet (0.125 mg total) under the tongue every 6 (six) hours as needed.   IMODIUM PO Take 2 capsules by mouth every 6 (six) hours as needed (loose stool).   metFORMIN 500 MG tablet Commonly known as: GLUCOPHAGE Take 500 mg by mouth 2 (two) times daily with a meal.   methenamine 1 g tablet Commonly known as: HIPREX Take 1 g by mouth daily.   Nu-Iron 150 MG capsule Generic drug: iron polysaccharides Take 150 mg by mouth daily.   ondansetron 8 MG tablet Commonly known as: Zofran Take 1 tablet (8 mg total) by mouth 2 (two) times daily as needed (Nausea or vomiting). What changed: reasons to take this   oxyCODONE 5 MG immediate release tablet Commonly known as: Oxy IR/ROXICODONE Take 1 tablet (5 mg total) by mouth every 4 (four) hours as needed for severe pain.   prochlorperazine 10 MG tablet Commonly known as: COMPAZINE Take 1 tablet (10 mg total) by mouth every 6 (six) hours as needed (Nausea or vomiting). What changed: reasons to take this   propranolol 10 MG tablet Commonly known as: INDERAL TAKE 1 TABLET BY MOUTH TWICE A DAY   protein supplement 6 g Powd Commonly known as: RESOURCE BENEPROTEIN Take 1 Scoop (6 g total) by mouth 3 (three) times daily with meals.   silver sulfADIAZINE 1 % cream Commonly known as: Silvadene Apply 1 application topically daily.      Allergies  Allergen Reactions  . Cephalexin Anaphylaxis and Other (See Comments)    Kef tabs only (maybe dye) Capsules-anaphylxis  . Amoxapine And Related   . Amoxicillin-Pot Clavulanate Other (See Comments)  . Atorvastatin Other (See Comments)    intolernace  . Cefaclor Other (See Comments)  . Ciprofloxacin Nausea And  Vomiting  . Codeine Hives  . Doxycycline Calcium Nausea And Vomiting  . Fenofibrate Micronized Other (See Comments)    Cramping  . Fish Oil Other (See Comments)  . Flagyl [Metronidazole Hcl]   . Levofloxacin In D5w Other (See Comments)    Unknown   . Metronidazole Nausea And Vomiting  . Ofloxacin Other (See Comments)  . Prednisolone Other (See Comments)    Anxiety, palpitations  . Promethazine Hcl Other (See Comments)  . Tape Other (See Comments)    Rash and blisters, only plastic tape  . Tetracycline Hcl Other (See  Comments)  . Erythromycin Rash  . Nitrofurantoin Itching, Nausea And Vomiting and Rash      Procedures/Studies: CT ABDOMEN PELVIS W CONTRAST  Result Date: 03/13/2020 CLINICAL DATA:  Abdominal pain and fever, history of squamous cell EXAM: CT ABDOMEN AND PELVIS WITH CONTRAST TECHNIQUE: Multidetector CT imaging of the abdomen and pelvis was performed using the standard protocol following bolus administration of intravenous contrast. CONTRAST:  185mL OMNIPAQUE IOHEXOL 300 MG/ML  SOLN COMPARISON:  September 04, 2019 FINDINGS: Lower chest: The visualized heart size within normal limits. There is a trace pericardial effusion. No hiatal hernia. The visualized portions of the lungs are clear. Hepatobiliary: There is diffuse low density seen throughout the liver parenchyma. No focal hepatic lesion is seen. No intra or extrahepatic biliary ductal dilatation. The main portal vein is patent. No evidence of calcified gallstones, gallbladder wall thickening or biliary dilatation. Pancreas: Unremarkable. No pancreatic ductal dilatation or surrounding inflammatory changes. Spleen: Normal in size without focal abnormality. Adrenals/Urinary Tract: Both adrenal glands appear normal. There is a punctate calcifications seen in the upper pole of the right kidney. There is areas of scarring seen within the upper pole. A 1 cm low-density lesion seen in the lower pole the right kidney. No left-sided renal  or collecting system calculi. There is mild diffuse bladder wall thickening. Stomach/Bowel: The stomach and proximal small bowel are unremarkable. There is a long segment of ileum with diffuse wall thickening surrounding fat stranding changes to the terminal ileum. There is also wall thickening seen around the distal cecal pole. Mild wall thickening with surrounding fat stranding changes are seen around the sigmoid colon extending to the sigmoid rectal junction. No loculated fluid collections or free air however are noted. Vascular/Lymphatic: There are no enlarged mesenteric, retroperitoneal, or pelvic lymph nodes. Scattered aortic atherosclerotic calcifications are seen without aneurysmal dilatation. Reproductive: The patient is status post hysterectomy. No adnexal masses or collections seen. Other: A small amount of free fluid is seen within the deep cul-de-sac. Musculoskeletal: No acute or significant osseous findings. IMPRESSION: Diffuse wall thickening with inflammatory changes involving a long segment of distal ileum, cecum, and sigmoid colon likely represents enteritis/colitis. This could be due to infectious, or inflammatory nature, not thought to represent ischemic colitis. Hepatic steatosis Trace pericardial effusion Small free fluid in the deep pelvis Electronically Signed   By: Prudencio Pair M.D.   On: 03/13/2020 21:30   IR PICC PLACEMENT RIGHT <5 YRS INC IMG GUIDE  Result Date: 03/03/2020 INDICATION: Patient with history of squamous cell carcinoma of the anus; central venous access requested for chemotherapy. EXAM: RIGHT UPPER EXTREMITY PICC LINE PLACEMENT WITH ULTRASOUND AND FLUOROSCOPIC GUIDANCE MEDICATIONS: 1% lidocaine to skin and subcutaneous tissue ANESTHESIA/SEDATION: None FLUOROSCOPY TIME:  Fluoroscopy Time: 54  seconds (6 mGy). COMPLICATIONS: None immediate. PROCEDURE: The patient was advised of the possible risks and complications and agreed to undergo the procedure. The patient was then  brought to the angiographic suite for the procedure. The right arm was prepped with chlorhexidine, draped in the usual sterile fashion using maximum barrier technique (cap and mask, sterile gown, sterile gloves, large sterile sheet, hand hygiene and cutaneous antisepsis) and infiltrated locally with 1% Lidocaine. Ultrasound demonstrated patency of the right basilic vein, and this was documented with an image. Under real-time ultrasound guidance, this vein was accessed with a 21 gauge micropuncture needle and image documentation was performed. A 0.018 wire was introduced in to the vein. Over this, a 5 [place] single lumen power-injectable PICC was advanced to  the lower SVC/right atrial junction. Fluoroscopy during the procedure and fluoro spot radiograph confirms appropriate catheter position. The catheter was flushed and covered with a sterile dressing. Catheter length: 40 cm IMPRESSION: Successful right arm Power PICC line placement with ultrasound and fluoroscopic guidance. The catheter is ready for use. Read by: Rowe Robert, PA-C Electronically Signed   By: Corrie Mckusick D.O.   On: 03/03/2020 11:20      Subjective: Overall feels better than yesterday.  Able to tolerate soft diet with some intermittent abdominal bloating, soreness/pain but not as bad as the day prior, no nausea or vomiting.  Diarrhea has significantly improved and even had a BM with small formed stools.  No chest pain or pain elsewhere, palpitations, dizziness or lightheadedness.  Discharge Exam:  Vitals:   03/19/20 0623 03/19/20 1528 03/19/20 2116 03/20/20 0608  BP: 103/62 102/60 123/64 117/68  Pulse: 96 93 91 87  Resp: 16 15 17 16   Temp: 99 F (37.2 C) 99.1 F (37.3 C) 98.7 F (37.1 C) 99 F (37.2 C)  TempSrc: Oral Oral Oral Oral  SpO2: 96% 97% 96% 95%  Weight:      Height:        General exam: Middle-age female, moderately built and nourished sitting up comfortably in bed without distress.  Oral mucosa  moist. Respiratory system: Clear to auscultation. Respiratory effort normal. Cardiovascular system: S1 & S2 heard, RRR. No JVD, murmurs, rubs, gallops or clicks. No pedal edema. Gastrointestinal system: Abdomen is nondistended, soft and nontender. No organomegaly or masses felt. Normal bowel sounds heard. Central nervous system: Alert and oriented. No focal neurological deficits. Extremities: Symmetric 5 x 5 power. Skin: No rashes, lesions or ulcers Psychiatry: Judgement and insight appear normal. Mood & affect appropriate.  Appeared to be in good spirits.    The results of significant diagnostics from this hospitalization (including imaging, microbiology, ancillary and laboratory) are listed below for reference.     Microbiology: Recent Results (from the past 240 hour(s))  Culture, Urine     Status: Abnormal   Collection Time: 03/10/20  3:06 PM   Specimen: Urine, Clean Catch  Result Value Ref Range Status   Specimen Description   Final    URINE, CLEAN CATCH Performed at [facility], [address] [place], [place] [address]    Special Requests   Final    NONE Performed at [facility], [place] [address], [place], [place] [address]    Culture (A)  Final    10,000 COLONIES/mL ESCHERICHIA COLI >=100,000 COLONIES/mL METHICILLIN RESISTANT STAPHYLOCOCCUS AUREUS    Report Status 03/12/2020 FINAL  Final   Organism ID, Bacteria ESCHERICHIA COLI (A)  Final   Organism ID, Bacteria METHICILLIN RESISTANT STAPHYLOCOCCUS AUREUS (A)  Final      Susceptibility   Escherichia coli - MIC*    AMPICILLIN <=2 SENSITIVE Sensitive     CEFAZOLIN <=4 SENSITIVE Sensitive     CEFTRIAXONE <=0.25 SENSITIVE Sensitive     CIPROFLOXACIN <=0.25 SENSITIVE Sensitive     GENTAMICIN <=1 SENSITIVE Sensitive     IMIPENEM <=0.25 SENSITIVE Sensitive     NITROFURANTOIN <=16 SENSITIVE Sensitive     TRIMETH/SULFA <=20 SENSITIVE Sensitive     AMPICILLIN/SULBACTAM <=2  SENSITIVE Sensitive     PIP/TAZO <=4 SENSITIVE Sensitive     * 10,000 COLONIES/mL ESCHERICHIA COLI   Methicillin resistant staphylococcus aureus - MIC*    CIPROFLOXACIN >=8 RESISTANT Resistant     GENTAMICIN <=0.5 SENSITIVE Sensitive  NITROFURANTOIN <=16 SENSITIVE Sensitive     OXACILLIN >=4 RESISTANT Resistant     TETRACYCLINE <=1 SENSITIVE Sensitive     VANCOMYCIN 1 SENSITIVE Sensitive     TRIMETH/SULFA <=10 SENSITIVE Sensitive     CLINDAMYCIN <=0.25 SENSITIVE Sensitive     RIFAMPIN <=0.5 SENSITIVE Sensitive     Inducible Clindamycin NEGATIVE Sensitive     * >=100,000 COLONIES/mL METHICILLIN RESISTANT STAPHYLOCOCCUS AUREUS  Culture, blood (routine x 2)     Status: None   Collection Time: 03/10/20  3:54 PM   Specimen: BLOOD  Result Value Ref Range Status   Specimen Description   Final    BLOOD LEFT ARM Performed at [facility], [place] [address], [place], [place] [address]    Special Requests   Final    BOTTLES DRAWN AEROBIC AND ANAEROBIC Blood Culture adequate volume   Culture   Final    NO GROWTH 5 DAYS Performed at [facility], [place] [address], [place], [place] [address]    Report Status 03/15/2020 FINAL  Final  Respiratory Panel by RT PCR (Flu A&B, Covid) - Nasopharyngeal Swab     Status: None   Collection Time: 03/16/20  7:52 AM   Specimen: Nasopharyngeal Swab  Result Value Ref Range Status   SARS Coronavirus 2 by RT PCR NEGATIVE NEGATIVE Final    Comment: (NOTE) SARS-CoV-2 target nucleic acids are NOT DETECTED.  The SARS-CoV-2 RNA is generally detectable in upper respiratoy specimens during the acute phase of infection. The lowest concentration of SARS-CoV-2 viral copies this assay can detect is 131 copies/mL. A negative result does not preclude SARS-Cov-2 infection and should not be used as the sole basis for treatment or other patient management decisions. A negative result may occur with  improper specimen collection/handling,  submission of specimen other than nasopharyngeal swab, presence of viral mutation(s) within the areas targeted by this assay, and inadequate number of viral copies (<131 copies/mL). A negative result must be combined with clinical observations, patient history, and epidemiological information. The expected result is Negative.  Fact Sheet for Patients:  PinkCheek.be  Fact Sheet for Healthcare Providers:  GravelBags.it  This test is no t yet approved or cleared by the [place] FDA and  has been authorized for detection and/or diagnosis of SARS-CoV-2 by FDA under an Emergency Use Authorization (EUA). This EUA will remain  in effect (meaning this test can be used) for the duration of the COVID-19 declaration under Section 564(b)(1) of the Act, 21 U.S.C. section 360bbb-3(b)(1), unless the authorization is terminated or revoked sooner.     Influenza A by PCR NEGATIVE NEGATIVE Final   Influenza B by PCR NEGATIVE NEGATIVE Final    Comment: (NOTE) The Xpert Xpress SARS-CoV-2/FLU/RSV assay is intended as an aid in  the diagnosis of influenza from Nasopharyngeal swab specimens and  should not be used as a sole basis for treatment. Nasal washings and  aspirates are unacceptable for Xpert Xpress SARS-CoV-2/FLU/RSV  testing.  Fact Sheet for Patients: PinkCheek.be  Fact Sheet for Healthcare Providers: GravelBags.it  This test is not yet approved or cleared by the [place] FDA and  has been authorized for detection and/or diagnosis of SARS-CoV-2 by  FDA under an Emergency Use Authorization (EUA). This EUA will remain  in effect (meaning this test can be used) for the duration of the  Covid-19 declaration under Section 564(b)(1) of the Act, 21  U.S.C. section 360bbb-3(b)(1), unless the authorization is  terminated or revoked. Performed at  [facility], [place] [address], [place], [place] [address]      Labs: CBC: Recent Labs  Lab 03/15/20 [phone] 03/15/20 0657 03/16/20 0704 03/17/20 0610 03/18/20 0559 03/19/20 0544 03/20/20 0629  WBC 1.6*   < > 1.4* 1.8* 1.9* 2.3* 2.3*  NEUTROABS 1.0*  --  0.9* 1.2* 1.4* 1.6*  --   HGB 10.1*   < > 9.9* 10.6* 10.6* 10.5* 10.0*  HCT 32.0*   < > 31.4* 34.4* 33.7* 33.5* 31.7*  MCV 84.9   < > 84.9 85.6 85.3 85.7 85.4  PLT 123*   < > 122* 143* 123* 130* 136*   < > = values in this interval not displayed.    Basic Metabolic Panel: Recent Labs  Lab 03/13/20 1742 03/14/20 0523 03/15/20 5462 03/15/20 0657 03/16/20 0704 03/17/20 0610 03/18/20 0559 03/19/20 0544 03/20/20 0629  NA 137   < > 139   < > 140 137 140 135 137  K 3.4*   < > 3.5   < > 3.4* 4.0 3.3* 4.2 3.8  CL 106   < > 107   < > 109 107 108 106 108  CO2 23   < > 24   < > 22 21* 21* 23 22  GLUCOSE 174*   < > 114*   < > 124* 106* 113* 93 98  BUN 13   < > 10   < > 7* 5* 6* <5* 5*  CREATININE 0.59   < > 0.70   < > 0.66 0.80 0.92 0.90 0.75  CALCIUM 8.1*   < > 8.4*   < > 8.4* 8.5* 8.5* 8.4* 8.3*  MG 1.6*  --  2.3  --   --   --   --  2.2  --    < > = values in this interval not displayed.    Liver Function Tests: Recent Labs  Lab 03/15/20 0657 03/16/20 0704 03/17/20 0610 03/18/20 0559 03/19/20 0544  AST 15 12* 12* 12* 11*  ALT 12 11 12 13 11   ALKPHOS 42 40 41 41 43  BILITOT 0.6 0.7 0.7 0.5 0.3  PROT 5.6* 5.2* 5.6* 5.5* 5.5*  ALBUMIN 2.8* 2.7* 2.8* 2.7* 2.7*    CBG: Recent Labs  Lab 03/19/20 1153 03/19/20 1641 03/19/20 2118 03/20/20 0735 03/20/20 1225  GLUCAP 96 98 134* 94 124*   Urinalysis    Component Value Date/Time   COLORURINE RED (A) 03/10/2020 1506   APPEARANCEUR HAZY (A) 03/10/2020 1506   LABSPEC 1.024 03/10/2020 1506   PHURINE 5.0 03/10/2020 1506   GLUCOSEU 50 (A) 03/10/2020 1506   GLUCOSEU NEGATIVE 01/16/2013 0903   HGBUR MODERATE (A) 03/10/2020 1506   BILIRUBINUR NEGATIVE 03/10/2020 1506    KETONESUR NEGATIVE 03/10/2020 1506   PROTEINUR 100 (A) 03/10/2020 1506   UROBILINOGEN 0.2 01/16/2013 0903   NITRITE POSITIVE (A) 03/10/2020 1506   LEUKOCYTESUR NEGATIVE 03/10/2020 1506    I discussed in detail with patients spouse via phone (as requested by the patient), updated care and answered all questions.  Time coordinating discharge: 50 minutes  SIGNED:  Vernell Leep, MD, [place], [facility]. [facility]  To contact the attending provider between 7A-7P or the covering provider during after hours 7P-7A, please log into the web site [email or web address] and access using universal [place] password for that web site. If you do not have the password, please call the hospital operator.

## 2020-03-21 ENCOUNTER — Telehealth: Payer: Self-pay

## 2020-03-21 NOTE — Telephone Encounter (Signed)
Melinda [name] left vm stating that she had a fever last pm 100.4 and 100.9.  She denies chills, cough, dyspnea, dysuria.  She took tylenol last pm and the fever went away.  She is afebrile this now.

## 2020-03-25 ENCOUNTER — Telehealth: Payer: Self-pay

## 2020-03-25 ENCOUNTER — Inpatient Hospital Stay: Payer: BC Managed Care – PPO

## 2020-03-25 ENCOUNTER — Other Ambulatory Visit: Payer: Self-pay

## 2020-03-25 ENCOUNTER — Inpatient Hospital Stay (HOSPITAL_BASED_OUTPATIENT_CLINIC_OR_DEPARTMENT_OTHER): Payer: BC Managed Care – PPO | Admitting: Medical

## 2020-03-25 VITALS — BP 109/77 | HR 88 | Temp 96.9°F | Resp 18 | Ht 66.0 in | Wt 146.3 lb

## 2020-03-25 DIAGNOSIS — C210 Malignant neoplasm of anus, unspecified: Secondary | ICD-10-CM

## 2020-03-25 DIAGNOSIS — R350 Frequency of micturition: Secondary | ICD-10-CM

## 2020-03-25 DIAGNOSIS — N3000 Acute cystitis without hematuria: Secondary | ICD-10-CM | POA: Diagnosis not present

## 2020-03-25 DIAGNOSIS — N3090 Cystitis, unspecified without hematuria: Secondary | ICD-10-CM | POA: Diagnosis not present

## 2020-03-25 LAB — CBC WITH DIFFERENTIAL (CANCER CENTER ONLY)
Abs Immature Granulocytes: 0.04 10*3/uL (ref 0.00–0.07)
Basophils Absolute: 0 10*3/uL (ref 0.0–0.1)
Basophils Relative: 1 %
Eosinophils Absolute: 0 10*3/uL (ref 0.0–0.5)
Eosinophils Relative: 0 %
HCT: 37.1 % (ref 36.0–46.0)
Hemoglobin: 11.8 g/dL — ABNORMAL LOW (ref 12.0–15.0)
Immature Granulocytes: 1 %
Lymphocytes Relative: 16 %
Lymphs Abs: 0.5 10*3/uL — ABNORMAL LOW (ref 0.7–4.0)
MCH: 26.9 pg (ref 26.0–34.0)
MCHC: 31.8 g/dL (ref 30.0–36.0)
MCV: 84.7 fL (ref 80.0–100.0)
Monocytes Absolute: 0.4 10*3/uL (ref 0.1–1.0)
Monocytes Relative: 12 %
Neutro Abs: 2.3 10*3/uL (ref 1.7–7.7)
Neutrophils Relative %: 70 %
Platelet Count: 201 10*3/uL (ref 150–400)
RBC: 4.38 MIL/uL (ref 3.87–5.11)
RDW: 16.2 % — ABNORMAL HIGH (ref 11.5–15.5)
WBC Count: 3.3 10*3/uL — ABNORMAL LOW (ref 4.0–10.5)
nRBC: 0 % (ref 0.0–0.2)

## 2020-03-25 LAB — CMP (CANCER CENTER ONLY)
ALT: 11 U/L (ref 0–44)
AST: 15 U/L (ref 15–41)
Albumin: 3 g/dL — ABNORMAL LOW (ref 3.5–5.0)
Alkaline Phosphatase: 67 U/L (ref 38–126)
Anion gap: 5 (ref 5–15)
BUN: 15 mg/dL (ref 8–23)
CO2: 28 mmol/L (ref 22–32)
Calcium: 9.7 mg/dL (ref 8.9–10.3)
Chloride: 103 mmol/L (ref 98–111)
Creatinine: 0.84 mg/dL (ref 0.44–1.00)
GFR, Estimated: >60 mL/min (ref >60)
Glucose, Bld: 119 mg/dL — ABNORMAL HIGH (ref 70–99)
Potassium: 4.3 mmol/L (ref 3.5–5.1)
Sodium: 136 mmol/L (ref 135–145)
Total Bilirubin: 0.4 mg/dL (ref 0.3–1.2)
Total Protein: 6.9 g/dL (ref 6.5–8.1)

## 2020-03-25 MED ORDER — SULFAMETHOXAZOLE-TRIMETHOPRIM 800-160 MG PO TABS: 1.0000 | ORAL_TABLET | Freq: Two times a day (BID) | ORAL | 0 refills | Status: DC

## 2020-03-25 MED ORDER — PHENAZOPYRIDINE HCL 100 MG PO TABS: 100.0000 mg | ORAL_TABLET | Freq: Three times a day (TID) | ORAL | 0 refills | Status: DC | PRN

## 2020-03-25 NOTE — Telephone Encounter (Signed)
Ms Amstutz called stating she is in excruciation pain.  She states she is having colitis type pain and bladder pain with frequency.  Reviewed with Sandi Mealy, PA appt made for lab and [facility] for today.  Reviewed with Ms Wendt.  She verbalized understanding.

## 2020-03-26 ENCOUNTER — Encounter (HOSPITAL_COMMUNITY)
Admission: EM | Disposition: A | Discharge status: Home / Self Care | Payer: Self-pay | Source: Home / Self Care | Attending: Internal Medicine

## 2020-03-26 ENCOUNTER — Inpatient Hospital Stay (HOSPITAL_COMMUNITY)
Admission: EM | Admit: 2020-03-26 | Discharge: 2020-03-31 | DRG: 694 | Disposition: A | Discharge status: Home / Self Care | Payer: BC Managed Care – PPO | Attending: Internal Medicine | Admitting: Internal Medicine

## 2020-03-26 ENCOUNTER — Encounter (HOSPITAL_COMMUNITY): Payer: Self-pay

## 2020-03-26 ENCOUNTER — Inpatient Hospital Stay: Payer: BC Managed Care – PPO

## 2020-03-26 ENCOUNTER — Inpatient Hospital Stay (HOSPITAL_COMMUNITY): Payer: BC Managed Care – PPO | Admitting: Certified Registered Nurse Anesthetist

## 2020-03-26 ENCOUNTER — Inpatient Hospital Stay (HOSPITAL_COMMUNITY): Payer: BC Managed Care – PPO

## 2020-03-26 ENCOUNTER — Emergency Department (HOSPITAL_COMMUNITY): Payer: BC Managed Care – PPO

## 2020-03-26 ENCOUNTER — Inpatient Hospital Stay: Payer: BC Managed Care – PPO | Admitting: Hematology

## 2020-03-26 ENCOUNTER — Other Ambulatory Visit: Payer: Self-pay

## 2020-03-26 DIAGNOSIS — K5900 Constipation, unspecified: Secondary | ICD-10-CM | POA: Diagnosis present

## 2020-03-26 DIAGNOSIS — E785 Hyperlipidemia, unspecified: Secondary | ICD-10-CM | POA: Diagnosis not present

## 2020-03-26 DIAGNOSIS — N211 Calculus in urethra: Secondary | ICD-10-CM | POA: Diagnosis not present

## 2020-03-26 DIAGNOSIS — D6481 Anemia due to antineoplastic chemotherapy: Secondary | ICD-10-CM | POA: Diagnosis present

## 2020-03-26 DIAGNOSIS — Z923 Personal history of irradiation: Secondary | ICD-10-CM

## 2020-03-26 DIAGNOSIS — D72819 Decreased white blood cell count, unspecified: Secondary | ICD-10-CM | POA: Diagnosis present

## 2020-03-26 DIAGNOSIS — Z9221 Personal history of antineoplastic chemotherapy: Secondary | ICD-10-CM

## 2020-03-26 DIAGNOSIS — N390 Urinary tract infection, site not specified: Secondary | ICD-10-CM

## 2020-03-26 DIAGNOSIS — C210 Malignant neoplasm of anus, unspecified: Secondary | ICD-10-CM | POA: Diagnosis not present

## 2020-03-26 DIAGNOSIS — E119 Type 2 diabetes mellitus without complications: Secondary | ICD-10-CM | POA: Diagnosis present

## 2020-03-26 DIAGNOSIS — Z20822 Contact with and (suspected) exposure to covid-19: Secondary | ICD-10-CM | POA: Diagnosis present

## 2020-03-26 DIAGNOSIS — I10 Essential (primary) hypertension: Secondary | ICD-10-CM | POA: Diagnosis present

## 2020-03-26 DIAGNOSIS — Z79899 Other long term (current) drug therapy: Secondary | ICD-10-CM | POA: Diagnosis not present

## 2020-03-26 DIAGNOSIS — Z90711 Acquired absence of uterus with remaining cervical stump: Secondary | ICD-10-CM

## 2020-03-26 DIAGNOSIS — K760 Fatty (change of) liver, not elsewhere classified: Secondary | ICD-10-CM | POA: Diagnosis not present

## 2020-03-26 DIAGNOSIS — K219 Gastro-esophageal reflux disease without esophagitis: Secondary | ICD-10-CM | POA: Diagnosis not present

## 2020-03-26 DIAGNOSIS — Z87891 Personal history of nicotine dependence: Secondary | ICD-10-CM

## 2020-03-26 DIAGNOSIS — Z7984 Long term (current) use of oral hypoglycemic drugs: Secondary | ICD-10-CM

## 2020-03-26 DIAGNOSIS — N3289 Other specified disorders of bladder: Secondary | ICD-10-CM | POA: Diagnosis present

## 2020-03-26 DIAGNOSIS — R109 Unspecified abdominal pain: Secondary | ICD-10-CM | POA: Diagnosis present

## 2020-03-26 DIAGNOSIS — N132 Hydronephrosis with renal and ureteral calculous obstruction: Secondary | ICD-10-CM | POA: Diagnosis not present

## 2020-03-26 DIAGNOSIS — D509 Iron deficiency anemia, unspecified: Secondary | ICD-10-CM | POA: Diagnosis not present

## 2020-03-26 DIAGNOSIS — T451X5A Adverse effect of antineoplastic and immunosuppressive drugs, initial encounter: Secondary | ICD-10-CM | POA: Diagnosis present

## 2020-03-26 DIAGNOSIS — R1030 Lower abdominal pain, unspecified: Secondary | ICD-10-CM | POA: Diagnosis not present

## 2020-03-26 DIAGNOSIS — R112 Nausea with vomiting, unspecified: Secondary | ICD-10-CM | POA: Diagnosis not present

## 2020-03-26 DIAGNOSIS — N1330 Unspecified hydronephrosis: Secondary | ICD-10-CM | POA: Diagnosis not present

## 2020-03-26 DIAGNOSIS — K529 Noninfective gastroenteritis and colitis, unspecified: Secondary | ICD-10-CM | POA: Diagnosis not present

## 2020-03-26 DIAGNOSIS — D6959 Other secondary thrombocytopenia: Secondary | ICD-10-CM | POA: Diagnosis not present

## 2020-03-26 DIAGNOSIS — R1032 Left lower quadrant pain: Secondary | ICD-10-CM | POA: Diagnosis present

## 2020-03-26 DIAGNOSIS — R1031 Right lower quadrant pain: Secondary | ICD-10-CM | POA: Diagnosis not present

## 2020-03-26 HISTORY — PX: CYSTOSCOPY/URETEROSCOPY/HOLMIUM LASER/STENT PLACEMENT: SHX6546

## 2020-03-26 HISTORY — DX: Malignant (primary) neoplasm, unspecified: C80.1

## 2020-03-26 LAB — RESPIRATORY PANEL BY RT PCR (FLU A&B, COVID)
Influenza A by PCR: NEGATIVE
Influenza B by PCR: NEGATIVE
SARS Coronavirus 2 by RT PCR: NEGATIVE

## 2020-03-26 LAB — URINALYSIS, ROUTINE W REFLEX MICROSCOPIC
Bilirubin Urine: NEGATIVE
Glucose, UA: NEGATIVE mg/dL
Ketones, ur: 5 mg/dL — AB
Nitrite: NEGATIVE
Protein, ur: 30 mg/dL — AB
Specific Gravity, Urine: 1.021 (ref 1.005–1.030)
pH: 5 (ref 5.0–8.0)

## 2020-03-26 LAB — URINE CULTURE: Culture: <10000 — AB

## 2020-03-26 LAB — CBC
HCT: 36.4 % (ref 36.0–46.0)
Hemoglobin: 11.8 g/dL — ABNORMAL LOW (ref 12.0–15.0)
MCH: 27.1 pg (ref 26.0–34.0)
MCHC: 32.4 g/dL (ref 30.0–36.0)
MCV: 83.7 fL (ref 80.0–100.0)
Platelets: 226 10*3/uL (ref 150–400)
RBC: 4.35 MIL/uL (ref 3.87–5.11)
RDW: 16 % — ABNORMAL HIGH (ref 11.5–15.5)
WBC: 4.4 10*3/uL (ref 4.0–10.5)
nRBC: 0 % (ref 0.0–0.2)

## 2020-03-26 LAB — COMPREHENSIVE METABOLIC PANEL
ALT: 13 U/L (ref 0–44)
AST: 17 U/L (ref 15–41)
Albumin: 3.6 g/dL (ref 3.5–5.0)
Alkaline Phosphatase: 59 U/L (ref 38–126)
Anion gap: 10 (ref 5–15)
BUN: 18 mg/dL (ref 8–23)
CO2: 21 mmol/L — ABNORMAL LOW (ref 22–32)
Calcium: 9.1 mg/dL (ref 8.9–10.3)
Chloride: 103 mmol/L (ref 98–111)
Creatinine, Ser: 0.77 mg/dL (ref 0.44–1.00)
GFR, Estimated: >60 mL/min (ref >60)
Glucose, Bld: 143 mg/dL — ABNORMAL HIGH (ref 70–99)
Potassium: 3.9 mmol/L (ref 3.5–5.1)
Sodium: 134 mmol/L — ABNORMAL LOW (ref 135–145)
Total Bilirubin: 0.6 mg/dL (ref 0.3–1.2)
Total Protein: 7.2 g/dL (ref 6.5–8.1)

## 2020-03-26 LAB — LACTIC ACID, PLASMA: Lactic Acid, Venous: 1.2 mmol/L (ref 0.5–1.9)

## 2020-03-26 LAB — LIPASE, BLOOD: Lipase: 44 U/L (ref 11–51)

## 2020-03-26 SURGERY — CYSTOSCOPY/URETEROSCOPY/HOLMIUM LASER/STENT PLACEMENT
Anesthesia: General | Laterality: Right

## 2020-03-26 MED ORDER — MORPHINE SULFATE (PF) 2 MG/ML IV SOLN
2.0000 mg | INTRAVENOUS | Status: DC | PRN
  Administered 2020-03-26 – 2020-03-28 (×4): 2 mg via INTRAVENOUS
  Filled 2020-03-26 (×4): qty 1

## 2020-03-26 MED ORDER — ONDANSETRON HCL 4 MG/2ML IJ SOLN
4.0000 mg | Freq: Four times a day (QID) | INTRAMUSCULAR | Status: DC | PRN
  Administered 2020-03-26 – 2020-03-27 (×2): 4 mg via INTRAVENOUS
  Filled 2020-03-26 (×2): qty 2

## 2020-03-26 MED ORDER — FENTANYL CITRATE (PF) 100 MCG/2ML IJ SOLN
INTRAMUSCULAR | Status: AC
Start: 2020-03-26 — End: ?
  Filled 2020-03-26: qty 2

## 2020-03-26 MED ORDER — METOPROLOL TARTRATE 5 MG/5ML IV SOLN: 5.0000 mg | Freq: Three times a day (TID) | INTRAVENOUS | Status: DC | PRN

## 2020-03-26 MED ORDER — CHLORHEXIDINE GLUCONATE CLOTH 2 % EX PADS
6.0000 | MEDICATED_PAD | Freq: Every day | CUTANEOUS | Status: DC
  Administered 2020-03-26: 6 via TOPICAL

## 2020-03-26 MED ORDER — VANCOMYCIN HCL IN DEXTROSE 1-5 GM/200ML-% IV SOLN
1000.0000 mg | Freq: Once | INTRAVENOUS | Status: AC
  Administered 2020-03-26: 1000 mg via INTRAVENOUS
  Filled 2020-03-26: qty 200

## 2020-03-26 MED ORDER — DIPHENHYDRAMINE HCL 50 MG/ML IJ SOLN
INTRAMUSCULAR | Status: AC
  Filled 2020-03-26: qty 1

## 2020-03-26 MED ORDER — IOHEXOL 300 MG/ML  SOLN
100.0000 mL | Freq: Once | INTRAMUSCULAR | Status: AC | PRN
  Administered 2020-03-26: 100 mL via INTRAVENOUS

## 2020-03-26 MED ORDER — ONDANSETRON HCL 4 MG PO TABS: 4.0000 mg | ORAL_TABLET | Freq: Four times a day (QID) | ORAL | Status: DC | PRN

## 2020-03-26 MED ORDER — MEPERIDINE HCL 50 MG/ML IJ SOLN: 6.2500 mg | INTRAMUSCULAR | Status: DC | PRN

## 2020-03-26 MED ORDER — DIPHENHYDRAMINE HCL 50 MG/ML IJ SOLN
25.0000 mg | Freq: Once | INTRAMUSCULAR | Status: AC
  Administered 2020-03-26: 25 mg via INTRAVENOUS

## 2020-03-26 MED ORDER — LIDOCAINE 2% (20 MG/ML) 5 ML SYRINGE
INTRAMUSCULAR | Status: DC | PRN
  Administered 2020-03-26: 100 mg via INTRAVENOUS

## 2020-03-26 MED ORDER — SODIUM CHLORIDE (PF) 0.9 % IJ SOLN
INTRAMUSCULAR | Status: AC
  Filled 2020-03-26: qty 50

## 2020-03-26 MED ORDER — ONDANSETRON HCL 4 MG/2ML IJ SOLN: 4.0000 mg | Freq: Once | INTRAMUSCULAR | Status: DC | PRN

## 2020-03-26 MED ORDER — SODIUM CHLORIDE 0.9 % IR SOLN
Status: DC | PRN
  Administered 2020-03-26: 6000 mL

## 2020-03-26 MED ORDER — ENOXAPARIN SODIUM 40 MG/0.4ML ~~LOC~~ SOLN
40.0000 mg | SUBCUTANEOUS | Status: DC
  Filled 2020-03-26 (×5): qty 0.4

## 2020-03-26 MED ORDER — PROPOFOL 10 MG/ML IV BOLUS
INTRAVENOUS | Status: DC | PRN
  Administered 2020-03-26: 100 mg via INTRAVENOUS

## 2020-03-26 MED ORDER — ONDANSETRON HCL 4 MG/2ML IJ SOLN
INTRAMUSCULAR | Status: DC | PRN
  Administered 2020-03-26: 4 mg via INTRAVENOUS

## 2020-03-26 MED ORDER — SODIUM CHLORIDE 0.9 % IV BOLUS
1000.0000 mL | Freq: Once | INTRAVENOUS | Status: AC
  Administered 2020-03-26: 1000 mL via INTRAVENOUS

## 2020-03-26 MED ORDER — IOHEXOL 300 MG/ML  SOLN
INTRAMUSCULAR | Status: DC | PRN
  Administered 2020-03-26: 14 mL

## 2020-03-26 MED ORDER — MIDAZOLAM HCL 2 MG/2ML IJ SOLN
INTRAMUSCULAR | Status: AC
  Filled 2020-03-26: qty 2

## 2020-03-26 MED ORDER — MIDAZOLAM HCL 5 MG/5ML IJ SOLN
INTRAMUSCULAR | Status: DC | PRN
  Administered 2020-03-26: 1 mg via INTRAVENOUS

## 2020-03-26 MED ORDER — FENTANYL CITRATE (PF) 100 MCG/2ML IJ SOLN
INTRAMUSCULAR | Status: DC | PRN
  Administered 2020-03-26 (×2): 50 ug via INTRAVENOUS

## 2020-03-26 MED ORDER — HYDROMORPHONE HCL 1 MG/ML IJ SOLN
INTRAMUSCULAR | Status: AC
Start: 2020-03-26 — End: 2020-03-27
  Filled 2020-03-26: qty 1

## 2020-03-26 MED ORDER — EPHEDRINE SULFATE-NACL 50-0.9 MG/10ML-% IV SOSY
PREFILLED_SYRINGE | INTRAVENOUS | Status: DC | PRN
  Administered 2020-03-26 (×4): 10 mg via INTRAVENOUS

## 2020-03-26 MED ORDER — HYDROMORPHONE HCL 1 MG/ML IJ SOLN
INTRAMUSCULAR | Status: AC
  Filled 2020-03-26: qty 1

## 2020-03-26 MED ORDER — HYDROMORPHONE HCL 1 MG/ML IJ SOLN
0.2500 mg | INTRAMUSCULAR | Status: DC | PRN
  Administered 2020-03-26 (×3): 0.5 mg via INTRAVENOUS

## 2020-03-26 MED ORDER — PHENYLEPHRINE 40 MCG/ML (10ML) SYRINGE FOR IV PUSH (FOR BLOOD PRESSURE SUPPORT)
PREFILLED_SYRINGE | INTRAVENOUS | Status: DC | PRN
  Administered 2020-03-26 (×2): 80 ug via INTRAVENOUS

## 2020-03-26 MED ORDER — LACTATED RINGERS IV SOLN: INTRAVENOUS | Status: DC

## 2020-03-26 MED ORDER — MORPHINE SULFATE 15 MG PO TABS
15.0000 mg | ORAL_TABLET | ORAL | Status: DC | PRN
  Administered 2020-03-26 – 2020-03-29 (×3): 15 mg via ORAL
  Filled 2020-03-26 (×3): qty 1

## 2020-03-26 MED ORDER — VANCOMYCIN HCL 750 MG/150ML IV SOLN
750.0000 mg | Freq: Two times a day (BID) | INTRAVENOUS | Status: DC
  Administered 2020-03-26 – 2020-03-27 (×2): 750 mg via INTRAVENOUS
  Filled 2020-03-26 (×2): qty 150

## 2020-03-26 MED ORDER — MORPHINE SULFATE (PF) 4 MG/ML IV SOLN
6.0000 mg | Freq: Once | INTRAVENOUS | Status: AC
  Administered 2020-03-26: 6 mg via INTRAVENOUS
  Filled 2020-03-26: qty 2

## 2020-03-26 MED ORDER — DIPHENHYDRAMINE HCL 50 MG/ML IJ SOLN
12.5000 mg | Freq: Four times a day (QID) | INTRAMUSCULAR | Status: DC | PRN
  Administered 2020-03-26 – 2020-03-27 (×2): 12.5 mg via INTRAVENOUS
  Filled 2020-03-26 (×2): qty 1

## 2020-03-26 MED ORDER — CIPROFLOXACIN IN D5W 400 MG/200ML IV SOLN
400.0000 mg | Freq: Two times a day (BID) | INTRAVENOUS | Status: DC
  Administered 2020-03-26 – 2020-03-27 (×3): 400 mg via INTRAVENOUS
  Filled 2020-03-26 (×3): qty 200

## 2020-03-26 MED ORDER — LACTATED RINGERS IV BOLUS
1000.0000 mL | Freq: Once | INTRAVENOUS | Status: AC
  Administered 2020-03-26: 1000 mL via INTRAVENOUS

## 2020-03-26 MED ORDER — CHLORHEXIDINE GLUCONATE 0.12 % MT SOLN
15.0000 mL | Freq: Once | OROMUCOSAL | Status: AC
  Administered 2020-03-26: 15 mL via OROMUCOSAL

## 2020-03-26 MED ORDER — LACTATED RINGERS IV SOLN: INTRAVENOUS | Status: AC

## 2020-03-26 SURGICAL SUPPLY — 19 items
BAG URO CATCHER STRL LF (MISCELLANEOUS) ×2 IMPLANT
BASKET STONE 1.7 NGAGE (UROLOGICAL SUPPLIES) ×1 IMPLANT
BASKET ZERO TIP NITINOL 2.4FR (BASKET) IMPLANT
BSKT STON RTRVL ZERO TP 2.4FR (BASKET)
CATH URET 5FR 28IN OPEN ENDED (CATHETERS) ×2 IMPLANT
CLOTH BEACON ORANGE TIMEOUT ST (SAFETY) ×2 IMPLANT
EXTRACTOR STONE 1.7FRX115CM (UROLOGICAL SUPPLIES) IMPLANT
GLOVE BIOGEL M STRL SZ7.5 (GLOVE) ×2 IMPLANT
GOWN STRL REUS W/TWL XL LVL3 (GOWN DISPOSABLE) ×2 IMPLANT
GUIDEWIRE ANG ZIPWIRE 038X150 (WIRE) IMPLANT
GUIDEWIRE STR DUAL SENSOR (WIRE) ×2 IMPLANT
KIT TURNOVER KIT A (KITS) IMPLANT
LASER FIB FLEXIVA PULSE ID 365 (Laser) ×1 IMPLANT
MANIFOLD NEPTUNE II (INSTRUMENTS) ×2 IMPLANT
PACK CYSTO (CUSTOM PROCEDURE TRAY) ×2 IMPLANT
SHEATH URETERAL 12FRX28CM (UROLOGICAL SUPPLIES) IMPLANT
SHEATH URETERAL 12FRX35CM (MISCELLANEOUS) IMPLANT
TUBING CONNECTING 10 (TUBING) ×2 IMPLANT
TUBING UROLOGY SET (TUBING) ×2 IMPLANT

## 2020-03-26 NOTE — Progress Notes (Signed)
[facility] Progress Note   Melinda [name] [id number] [birth year] 59 y.o.  Melinda [name] is managed by Dr. Truitt Merle  Actively treated with chemotherapy/immunotherapy/hormonal therapy: Concurrent chemoRT withMitomycin and 5FU on week 1 and 5starting 02/04/20-03/13/20, last chemo and radiation treatment on 03/07/2020, RT has been held since then   Last treated: 03/07/2020  Next scheduled appointment with provider: to be scheduled  Assessment: Plan:    Acute cystitis without hematuria - Plan: phenazopyridine (PYRIDIUM) 100 MG tablet, sulfamethoxazole-trimethoprim (BACTRIM DS) 800-160 MG tablet  Cystitis - Plan: phenazopyridine (PYRIDIUM) 100 MG tablet  Squamous cell cancer, anus ([name])   Suspected acute cystitis: A urine culture is pending.  The patient was put on Pyridium 100 mg p.o. 3 times daily and Bactrim DS p.o. twice daily x7 days.  History of cystitis: The patient was told to restart Hiprex which was stopped during her recent hospitalization and she was additionally given a prescription for Pyridium 100 mg p.o. 3 times daily.  The patient was also told to use tramadol which she has at home.  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   Please see After Visit Summary for patient specific instructions.  No future appointments.  No orders of the defined types were placed in this encounter.      Subjective:   Patient ID:  Melinda [name] is a 59 y.o. (DOB 07-03-1960) female.  Chief Complaint: No chief complaint on file.   HPI Melinda [name]  is a 59 y.o. female with a diagnosis of a recurrent squamous cell carcinoma of the anus. She has most recently been treated with concurrent chemoRT withMitomycin and 5FU on week 1 and 5starting 02/04/20-03/13/20,  last chemo and radiation treatment on 03/07/2020, RT has been held since then.   She has most recently been hospitalized from 03/13/2020 through 03/20/2020 for intractable nausea and vomiting with abdominal pain secondary to radiation induced colitis, UTI secondary to E. coli and MRSA, palpitations suspected secondary to A. fib, hypokalemia, pancytopenia, excoriations of the anus and labia, and diarrhea.  She presents to the clinic today with ongoing abdominal pain which occurs in her right flank and radiates to her suprapubic area.  She reports that she has dysuria with frequency and decreased output.  She has no fevers, chills, or sweats.  She reports that while in the hospital her narcotics were held at times secondary to her hypotension.  Her blood pressure today was 109/77.   Medications: I have reviewed the patient's current medications.  Allergies:  Allergies  Allergen Reactions  . Cephalexin Anaphylaxis and Other (See Comments)    Kef tabs only (maybe dye) Capsules-anaphylxis  . Amoxapine And Related   . Amoxicillin-Pot Clavulanate Other (See Comments)  . Atorvastatin Other (See Comments)    intolernace  . Cefaclor Other (See Comments)  . Ciprofloxacin Nausea And Vomiting  . Codeine Hives  . Doxycycline Calcium Nausea And Vomiting  . Fenofibrate Micronized Other (See Comments)    Cramping  . Fish Oil Other (See Comments)  . Flagyl [Metronidazole Hcl]   . Levofloxacin In D5w Other (See Comments)    Unknown   . Metronidazole Nausea And Vomiting  . Ofloxacin Other (See Comments)  . Prednisolone Other (See Comments)    Anxiety, palpitations  . Promethazine Hcl Other (See Comments)  .  Tape Other (See Comments)    Rash and blisters, only plastic tape  . Tetracycline Hcl Other (See Comments)  . Erythromycin Rash  . Nitrofurantoin Itching, Nausea And Vomiting and Rash    Past Medical History:  Diagnosis Date  . Diabetes mellitus type 2, controlled ([place])   . Dyslipidemia     untreated  . GERD (gastroesophageal reflux disease)   . Hemorrhoids   . Microcytic anemia 06/22/2012  . Nephrolith   . Palpitation    positive tilt table test. Prozac and Inderal treatment effective.    Past Surgical History:  Procedure Laterality Date  . APPENDECTOMY    . BREAST REDUCTION SURGERY    . CARDIAC CATHETERIZATION  2002   normal LV function and no significant coronary obstruction  . CATARACT EXTRACTION, BILATERAL Bilateral november 2020 and dcember 2020  . CYST EXCISION N/A 01/11/2020   Procedure: EXCISION OF ANAL CANAL MASS;  Surgeon: Ileana Roup, MD;  Location: [facility];  Service: General;  Laterality: N/A;  . ESOPHAGEAL MANOMETRY N/A 04/04/2017   Procedure: ESOPHAGEAL MANOMETRY (EM);  Surgeon: Laurence Spates, MD;  Location: [facility];  Service: Endoscopy;  Laterality: N/A;  . HEMORRHOID SURGERY N/A 07/13/2019   Procedure: HEMORRHOIDECTOMY;  Surgeon: Johnathan Hausen, MD;  Location: [place];  Service: General;  Laterality: N/A;  . KIDNEY SURGERY     kidney stones   . PARTIAL HYSTERECTOMY    . RECTAL EXAM UNDER ANESTHESIA N/A 01/11/2020   Procedure: RECTAL EXAM UNDER ANESTHESIA;  Surgeon: Ileana Roup, MD;  Location: [facility];  Service: General;  Laterality: N/A;  . TONSILLECTOMY      Family History  Problem Relation Age of Onset  . Heart disease Mother   . Liver disease Mother   . Heart disease Father   . Heart attack Father   . Cancer Father        prostate CA  . Cancer Maternal Grandmother 60       ovarian cancer  . Coronary artery disease Other        Fx of  . Heart attack Other   . Diabetes Other     Social History   Socioeconomic History  . Marital status: Married    Spouse name: Not on file  . Number of children: 1  . Years of education: Not on file  . Highest education level: Not on file  Occupational History    Comment: not working now.     Employer: UNEMPLOYED  Tobacco Use  . Smoking status: Former Smoker     Types: E-cigarettes    Quit date: 07/08/2017    Years since quitting: 2.7  . Smokeless tobacco: Never Used  . Tobacco comment: uses electronic  Vaping Use  . Vaping Use: Former  . Quit date: 07/03/2016  Substance and Sexual Activity  . Alcohol use: No  . Drug use: No  . Sexual activity: Yes  Other Topics Concern  . Not on file  Social History Narrative  . Not on file   Social Determinants of Health   Financial Resource Strain:   . Difficulty of Paying Living Expenses: Not on file  Food Insecurity:   . Worried About Charity fundraiser in the Last Year: Not on file  . Ran Out of Food in the Last Year: Not on file  Transportation Needs:   . Lack of Transportation (Medical): Not on file  . Lack of Transportation (Non-Medical): Not on file  Physical Activity:   .  Days of Exercise per Week: Not on file  . Minutes of Exercise per Session: Not on file  Stress:   . Feeling of Stress : Not on file  Social Connections:   . Frequency of Communication with Friends and Family: Not on file  . Frequency of Social Gatherings with Friends and Family: Not on file  . Attends Religious Services: Not on file  . Active Member of Clubs or Organizations: Not on file  . Attends Archivist Meetings: Not on file  . Marital Status: Not on file  Intimate Partner Violence:   . Fear of Current or Ex-Partner: Not on file  . Emotionally Abused: Not on file  . Physically Abused: Not on file  . Sexually Abused: Not on file    Past Medical History, Surgical history, Social history, and Family history were reviewed and updated as appropriate.   Please see review of systems for further details on the patient's review from today.   Review of Systems:  Review of Systems  Constitutional: Negative for activity change, appetite change, chills, diaphoresis and fever.  HENT: Negative for trouble swallowing.   Respiratory: Negative for cough, chest tightness and shortness of breath.   Cardiovascular:  Negative for chest pain, palpitations and leg swelling.  Gastrointestinal: Positive for abdominal pain. Negative for abdominal distention, constipation, diarrhea, nausea and vomiting.  Genitourinary: Positive for decreased urine volume, dysuria, flank pain, frequency and pelvic pain.    Objective:   Physical Exam:  BP 109/77 (BP Location: Left Arm, Patient Position: Sitting)   Pulse 88   Temp (!) 96.9 F (36.1 C) (Tympanic)   Resp 18   Ht 5\' 6"  (1.676 m)   Wt 146 lb 4.8 oz (66.4 kg)   SpO2 97%   BMI 23.61 kg/m  ECOG: 1  Physical Exam Constitutional:      General: She is not in acute distress.    Appearance: Normal appearance. She is not ill-appearing or toxic-appearing.  HENT:     Head: Normocephalic and atraumatic.  Neurological:     Mental Status: She is alert.     Gait: Gait normal.  Psychiatric:     Comments: The patient was intermittently tearful.     Lab Review:     Component Value Date/Time   NA 134 (L) 03/26/2020 0129   K 3.9 03/26/2020 0129   CL 103 03/26/2020 0129   CO2 21 (L) 03/26/2020 0129   GLUCOSE 143 (H) 03/26/2020 0129   BUN 18 03/26/2020 0129   CREATININE 0.77 03/26/2020 0129   CREATININE 0.84 03/25/2020 1436   CALCIUM 9.1 03/26/2020 0129   PROT 7.2 03/26/2020 0129   ALBUMIN 3.6 03/26/2020 0129   AST 17 03/26/2020 0129   AST 15 03/25/2020 1436   ALT 13 03/26/2020 0129   ALT 11 03/25/2020 1436   ALKPHOS 59 03/26/2020 0129   BILITOT 0.6 03/26/2020 0129   BILITOT 0.4 03/25/2020 1436   GFRNONAA >60 03/26/2020 0129   GFRNONAA >60 03/25/2020 1436   GFRAA >60 03/10/2020 1253       Component Value Date/Time   WBC 4.4 03/26/2020 0129   RBC 4.35 03/26/2020 0129   HGB 11.8 (L) 03/26/2020 0129   HGB 11.8 (L) 03/25/2020 1436   HGB 11.6 09/25/2012 1408   HCT 36.4 03/26/2020 0129   HCT 36.9 09/25/2012 1408   PLT 226 03/26/2020 0129   PLT 201 03/25/2020 1436   PLT 287 09/25/2012 1408   MCV 83.7 03/26/2020 0129   MCV 72.7 (  L) 09/25/2012 1408     MCH 27.1 03/26/2020 0129   MCHC 32.4 03/26/2020 0129   RDW 16.0 (H) 03/26/2020 0129   RDW 23.1 (H) 09/25/2012 1408   LYMPHSABS 0.5 (L) 03/25/2020 1436   LYMPHSABS 2.4 09/25/2012 1408   MONOABS 0.4 03/25/2020 1436   MONOABS 0.3 09/25/2012 1408   EOSABS 0.0 03/25/2020 1436   EOSABS 0.2 09/25/2012 1408   BASOSABS 0.0 03/25/2020 1436   BASOSABS 0.1 09/25/2012 1408   -------------------------------  Imaging from last 24 hours (if applicable):  Radiology interpretation: CT ABDOMEN PELVIS W CONTRAST  Result Date: 03/26/2020 CLINICAL DATA:  Increasing abdominal pain since recent discharge EXAM: CT ABDOMEN AND PELVIS WITH CONTRAST TECHNIQUE: Multidetector CT imaging of the abdomen and pelvis was performed using the standard protocol following bolus administration of intravenous contrast. CONTRAST:  118mL OMNIPAQUE IOHEXOL 300 MG/ML  SOLN COMPARISON:  03/13/2020 FINDINGS: Lower chest: No acute abnormality. Hepatobiliary: Probable hepatic steatosis. No focal liver lesion. Gallbladder is unremarkable. No biliary dilatation. Pancreas: Unremarkable. Spleen: Unremarkable. Adrenals/Urinary Tract: Adrenals unremarkable. Punctate nonobstructing calculus of the upper pole the right kidney. There is right upper pole cortical scarring. Mild right hydroureteronephrosis. This is secondary to an obstructing 3 mm calculus of the distal ureter, which was present on the prior study and has moved slightly inferiorly. Bladder is unremarkable. Stomach/Bowel: Stomach is within normal limits. There is persistent wall thickening involving the terminal ileum and adjacent distal ileum. Decreased cecal wall thickening. No bowel obstruction. Vascular/Lymphatic: Aortic atherosclerosis. No enlarged lymph nodes identified. Reproductive: Status post hysterectomy. No adnexal masses. Other: Small volume abdominopelvic free fluid. Abdominal wall is unremarkable. Musculoskeletal: No acute osseous abnormality. IMPRESSION: New mild right  hydroureteronephrosis. Secondary to an obstructing 3 mm distal right ureteral calculus. This was present on the prior study and has moved slightly inferiorly/distally. Residual or recurrent infectious/inflammatory changes of the distal ileum. Decreased cecal wall thickening. Electronically Signed   By: Macy Mis M.D.   On: 03/26/2020 09:39   CT ABDOMEN PELVIS W CONTRAST  Result Date: 03/13/2020 CLINICAL DATA:  Abdominal pain and fever, history of squamous cell EXAM: CT ABDOMEN AND PELVIS WITH CONTRAST TECHNIQUE: Multidetector CT imaging of the abdomen and pelvis was performed using the standard protocol following bolus administration of intravenous contrast. CONTRAST:  141mL OMNIPAQUE IOHEXOL 300 MG/ML  SOLN COMPARISON:  September 04, 2019 FINDINGS: Lower chest: The visualized heart size within normal limits. There is a trace pericardial effusion. No hiatal hernia. The visualized portions of the lungs are clear. Hepatobiliary: There is diffuse low density seen throughout the liver parenchyma. No focal hepatic lesion is seen. No intra or extrahepatic biliary ductal dilatation. The main portal vein is patent. No evidence of calcified gallstones, gallbladder wall thickening or biliary dilatation. Pancreas: Unremarkable. No pancreatic ductal dilatation or surrounding inflammatory changes. Spleen: Normal in size without focal abnormality. Adrenals/Urinary Tract: Both adrenal glands appear normal. There is a punctate calcifications seen in the upper pole of the right kidney. There is areas of scarring seen within the upper pole. A 1 cm low-density lesion seen in the lower pole the right kidney. No left-sided renal or collecting system calculi. There is mild diffuse bladder wall thickening. Stomach/Bowel: The stomach and proximal small bowel are unremarkable. There is a long segment of ileum with diffuse wall thickening surrounding fat stranding changes to the terminal ileum. There is also wall thickening seen around  the distal cecal pole. Mild wall thickening with surrounding fat stranding changes are seen around the sigmoid colon  extending to the sigmoid rectal junction. No loculated fluid collections or free air however are noted. Vascular/Lymphatic: There are no enlarged mesenteric, retroperitoneal, or pelvic lymph nodes. Scattered aortic atherosclerotic calcifications are seen without aneurysmal dilatation. Reproductive: The patient is status post hysterectomy. No adnexal masses or collections seen. Other: A small amount of free fluid is seen within the deep cul-de-sac. Musculoskeletal: No acute or significant osseous findings. IMPRESSION: Diffuse wall thickening with inflammatory changes involving a long segment of distal ileum, cecum, and sigmoid colon likely represents enteritis/colitis. This could be due to infectious, or inflammatory nature, not thought to represent ischemic colitis. Hepatic steatosis Trace pericardial effusion Small free fluid in the deep pelvis Electronically Signed   By: Prudencio Pair M.D.   On: 03/13/2020 21:30   IR PICC PLACEMENT RIGHT <5 YRS INC IMG GUIDE  Result Date: 03/03/2020 INDICATION: Patient with history of squamous cell carcinoma of the anus; central venous access requested for chemotherapy. EXAM: RIGHT UPPER EXTREMITY PICC LINE PLACEMENT WITH ULTRASOUND AND FLUOROSCOPIC GUIDANCE MEDICATIONS: 1% lidocaine to skin and subcutaneous tissue ANESTHESIA/SEDATION: None FLUOROSCOPY TIME:  Fluoroscopy Time: 54  seconds (6 mGy). COMPLICATIONS: None immediate. PROCEDURE: The patient was advised of the possible risks and complications and agreed to undergo the procedure. The patient was then brought to the angiographic suite for the procedure. The right arm was prepped with chlorhexidine, draped in the usual sterile fashion using maximum barrier technique (cap and mask, sterile gown, sterile gloves, large sterile sheet, hand hygiene and cutaneous antisepsis) and infiltrated locally with 1%  Lidocaine. Ultrasound demonstrated patency of the right basilic vein, and this was documented with an image. Under real-time ultrasound guidance, this vein was accessed with a 21 gauge micropuncture needle and image documentation was performed. A 0.018 wire was introduced in to the vein. Over this, a 5 [place] single lumen power-injectable PICC was advanced to the lower SVC/right atrial junction. Fluoroscopy during the procedure and fluoro spot radiograph confirms appropriate catheter position. The catheter was flushed and covered with a sterile dressing. Catheter length: 40 cm IMPRESSION: Successful right arm Power PICC line placement with ultrasound and fluoroscopic guidance. The catheter is ready for use. Read by: Rowe Robert, PA-C Electronically Signed   By: Corrie Mckusick D.O.   On: 03/03/2020 11:20

## 2020-03-26 NOTE — H&P (Signed)
History and Physical        Hospital Admission Note Date: 03/26/2020  Patient name: Melinda [name] Medical record number: [id number] Date of birth: Jun 02, 1961 Age: 59 y.o. Gender: female  PCP: Crist Infante, MD  Patient coming from: home Lives with: husband At baseline, ambulates: independently  Chief Complaint    Chief Complaint  Patient presents with  . Abdominal Pain      HPI:   This is a 59 year old female with past medical history of recurrent anal squamous cell carcinoma (most recently treated with chemoradiation with mitomycin and 5-FU, last treatment 03/07/2020), type 2 diabetes, hyperlipidemia who was recently admitted from 10/7-10/14 for radiation colitis and E. coli and MRSA UTI (discharged on Bactrim) who presented to the ED with complaints of worsening lower abdominal pain since her discharge.  She was supposed to see her oncologist today but could not manage the pain at home with tramadol.  Pain is sharp 10/10, nonradiating and has associated reduced p.o. intake.  Also with 3 loose bowel movements today.  ED Course: Afebrile and hemodynamically stable on room air with an episode of hypotension.  Labs most notable for UTI with normal renal function. She received morphine with some improvement in her pain but not resolved.  ED physician ordered a CT scan. CT abdomen pelvis with contrast: new mild right hydroureteronephrosis secondary to 3 mm distal right ureteral obstructing calculus which was present but moved since her prior study with residual or recurrent infectious/inflammatory distal ileum inflammatory changes.  EDP consulted Dr. Diona Fanti, and urology, and pharmacy who recommended Vanco and Cipro.  Vitals:   03/26/20 1015 03/26/20 1100  BP: 104/66 113/70  Pulse: 63 72  Resp: 16 16  Temp:    SpO2: 96% 95%     Review of Systems:  Review of Systems    Constitutional: Positive for chills and diaphoresis. Negative for fever.  Respiratory: Negative for cough and shortness of breath.   Cardiovascular: Negative for chest pain and palpitations.  Gastrointestinal: Positive for abdominal pain. Negative for blood in stool, nausea and vomiting.  Genitourinary: Positive for dysuria, frequency and urgency. Negative for hematuria.  Musculoskeletal: Negative.   All other systems reviewed and are negative.   Medical/Social/Family History   Past Medical History: Past Medical History:  Diagnosis Date  . Diabetes mellitus type 2, controlled ([place])   . Dyslipidemia    untreated  . GERD (gastroesophageal reflux disease)   . Hemorrhoids   . Microcytic anemia 06/22/2012  . Nephrolith   . Palpitation    positive tilt table test. Prozac and Inderal treatment effective.    Past Surgical History:  Procedure Laterality Date  . APPENDECTOMY    . BREAST REDUCTION SURGERY    . CARDIAC CATHETERIZATION  2002   normal LV function and no significant coronary obstruction  . CATARACT EXTRACTION, BILATERAL Bilateral november 2020 and dcember 2020  . CYST EXCISION N/A 01/11/2020   Procedure: EXCISION OF ANAL CANAL MASS;  Surgeon: Ileana Roup, MD;  Location: [facility];  Service: General;  Laterality: N/A;  . ESOPHAGEAL MANOMETRY N/A 04/04/2017   Procedure: ESOPHAGEAL MANOMETRY (EM);  Surgeon: Laurence Spates, MD;  Location: [facility];  Service: Endoscopy;  Laterality: N/A;  .  HEMORRHOID SURGERY N/A 07/13/2019   Procedure: HEMORRHOIDECTOMY;  Surgeon: Johnathan Hausen, MD;  Location: [place];  Service: General;  Laterality: N/A;  . KIDNEY SURGERY     kidney stones   . PARTIAL HYSTERECTOMY    . RECTAL EXAM UNDER ANESTHESIA N/A 01/11/2020   Procedure: RECTAL EXAM UNDER ANESTHESIA;  Surgeon: Ileana Roup, MD;  Location: [facility];  Service: General;  Laterality: N/A;  . TONSILLECTOMY      Medications: Prior to Admission medications    Medication Sig Start Date End Date Taking? Authorizing Provider  clorazepate (TRANXENE) 7.5 MG tablet Take 7.5 mg by mouth 2 (two) times daily. 02/12/18  Yes [provider]  CRESTOR 5 MG tablet Take 5 mg by mouth every evening.  08/10/14  Yes [provider]  esomeprazole (NEXIUM) 40 MG capsule Take 40 mg by mouth daily at 12 noon.   Yes [provider]  feeding supplement (ENSURE ENLIVE / ENSURE PLUS) LIQD Take 237 mLs by mouth daily. 03/21/20  Yes Hongalgi, Lenis Dickinson, MD  FLUoxetine (PROZAC) 20 MG capsule Take 20 mg by mouth every morning. 10/23/19  Yes [provider]  Loperamide HCl (IMODIUM PO) Take 2 capsules by mouth every 6 (six) hours as needed (loose stool).    Yes [provider]  methenamine (HIPREX) 1 g tablet Take 1 g by mouth daily.    Yes [provider]  NU-IRON 150 MG capsule Take 150 mg by mouth daily.  02/02/17  Yes [provider]  ondansetron (ZOFRAN) 8 MG tablet Take 1 tablet (8 mg total) by mouth 2 (two) times daily as needed (Nausea or vomiting). Patient taking differently: Take 8 mg by mouth 2 (two) times daily as needed for nausea or vomiting.  01/17/20  Yes Truitt Merle, MD  phenazopyridine (PYRIDIUM) 100 MG tablet Take 1 tablet (100 mg total) by mouth 3 (three) times daily as needed for pain. 03/25/20  Yes Tanner, Lyndon Code., PA-C  prochlorperazine (COMPAZINE) 10 MG tablet Take 1 tablet (10 mg total) by mouth every 6 (six) hours as needed (Nausea or vomiting). Patient taking differently: Take 10 mg by mouth every 6 (six) hours as needed for nausea.  01/17/20  Yes Truitt Merle, MD  propranolol (INDERAL) 10 MG tablet TAKE 1 TABLET BY MOUTH TWICE A DAY Patient taking differently: Take 10 mg by mouth 2 (two) times daily.  07/11/19  Yes Fay Records, MD  protein supplement (RESOURCE BENEPROTEIN) 6 g POWD Take 1 Scoop (6 g total) by mouth 3 (three) times daily with meals. 03/20/20  Yes Hongalgi, Lenis Dickinson, MD  Pumpkin Seed-Soy Germ  (AZO BLADDER CONTROL/GO-LESS PO) Take 1 tablet by mouth daily as needed (pain & irritation).   Yes [provider]  silver sulfADIAZINE (SILVADENE) 1 % cream Apply 1 application topically daily. 02/21/20  Yes Tanner, Lyndon Code., PA-C  sulfamethoxazole-trimethoprim (BACTRIM DS) 800-160 MG tablet Take 1 tablet by mouth 2 (two) times daily. 03/25/20  Yes Tanner, Lyndon Code., PA-C  traMADol (ULTRAM) 50 MG tablet Take 50 mg by mouth every 6 (six) hours as needed for moderate pain.   Yes [provider]  diphenoxylate-atropine (LOMOTIL) 2.5-0.025 MG tablet Take 1-2 tablets by mouth 4 (four) times daily as needed for diarrhea or loose stools. Patient not taking: Reported on 02/25/2020 02/15/20   Truitt Merle, MD  hyoscyamine (ANASPAZ) 0.125 MG TBDP disintergrating tablet Place 1 tablet (0.125 mg total) under the tongue every 6 (six) hours as needed. Patient  not taking: Reported on 03/13/2020 02/21/20   Harle Stanford., PA-C  metFORMIN (GLUCOPHAGE) 500 MG tablet Take 500 mg by mouth 2 (two) times daily with a meal.     [provider]  oxyCODONE (OXY IR/ROXICODONE) 5 MG immediate release tablet Take 1 tablet (5 mg total) by mouth every 4 (four) hours as needed for severe pain. Patient not taking: Reported on 03/26/2020 03/10/20   Truitt Merle, MD    Allergies:   Allergies  Allergen Reactions  . Cephalexin Anaphylaxis and Other (See Comments)    Kef tabs only (maybe dye) Capsules-anaphylxis  . Amoxapine And Related   . Amoxicillin-Pot Clavulanate Other (See Comments)  . Atorvastatin Other (See Comments)    intolernace  . Cefaclor Other (See Comments)  . Ciprofloxacin Nausea And Vomiting  . Codeine Hives  . Doxycycline Calcium Nausea And Vomiting  . Fenofibrate Micronized Other (See Comments)    Cramping  . Fish Oil Other (See Comments)  . Flagyl [Metronidazole Hcl]   . Levofloxacin In D5w Other (See Comments)    Unknown   . Metronidazole Nausea And Vomiting  . Ofloxacin Other (See  Comments)  . Prednisolone Other (See Comments)    Anxiety, palpitations  . Promethazine Hcl Other (See Comments)  . Tape Other (See Comments)    Rash and blisters, only plastic tape  . Tetracycline Hcl Other (See Comments)  . Erythromycin Rash  . Nitrofurantoin Itching, Nausea And Vomiting and Rash    Social History:  reports that she quit smoking about 2 years ago. Her smoking use included e-cigarettes. She has never used smokeless tobacco. She reports that she does not drink alcohol and does not use drugs.  Family History: Family History  Problem Relation Age of Onset  . Heart disease Mother   . Liver disease Mother   . Heart disease Father   . Heart attack Father   . Cancer Father        prostate CA  . Cancer Maternal Grandmother 60       ovarian cancer  . Coronary artery disease Other        Fx of  . Heart attack Other   . Diabetes Other      Objective   Physical Exam: Blood pressure 113/70, pulse 72, temperature 97.7 F (36.5 C), temperature source Oral, resp. rate 16, height 5\' 6"  (1.676 m), weight 66.4 kg, SpO2 95 %.  Physical Exam Vitals and nursing note reviewed.  Constitutional:      Appearance: Normal appearance.  HENT:     Head: Normocephalic and atraumatic.  Eyes:     Conjunctiva/sclera: Conjunctivae normal.  Cardiovascular:     Rate and Rhythm: Normal rate and regular rhythm.  Pulmonary:     Effort: Pulmonary effort is normal.     Breath sounds: Normal breath sounds.  Abdominal:     General: Abdomen is flat.     Palpations: Abdomen is soft.     Tenderness: There is abdominal tenderness in the right lower quadrant and suprapubic area.  Musculoskeletal:        General: No swelling or tenderness.  Skin:    Coloration: Skin is not jaundiced or pale.  Neurological:     Mental Status: She is alert. Mental status is at baseline.  Psychiatric:        Mood and Affect: Mood normal.        Behavior: Behavior normal.     LABS on Admission: I have  personally reviewed all the  labs and imaging below    Basic Metabolic Panel: Recent Labs  Lab 03/25/20 1436 03/26/20 0129  NA 136 134*  K 4.3 3.9  CL 103 103  CO2 28 21*  GLUCOSE 119* 143*  BUN 15 18  CREATININE 0.84 0.77  CALCIUM 9.7 9.1   Liver Function Tests: Recent Labs  Lab 03/25/20 1436 03/26/20 0129  AST 15 17  ALT 11 13  ALKPHOS 67 59  BILITOT 0.4 0.6  PROT 6.9 7.2  ALBUMIN 3.0* 3.6   Recent Labs  Lab 03/26/20 0129  LIPASE 44   No results for input(s): AMMONIA in the last 168 hours. CBC: Recent Labs  Lab 03/25/20 1436 03/25/20 1436 03/26/20 0129  WBC 3.3*  --  4.4  NEUTROABS 2.3  --   --   HGB 11.8*  --  11.8*  HCT 37.1  --  36.4  MCV 84.7   < > 83.7  PLT 201  --  226   < > = values in this interval not displayed.   Cardiac Enzymes: No results for input(s): CKTOTAL, CKMB, CKMBINDEX, TROPONINI in the last 168 hours. BNP: Invalid input(s): POCBNP CBG: Recent Labs  Lab 03/20/20 0735 03/20/20 1225  GLUCAP 94 124*    Radiological Exams on Admission:  CT ABDOMEN PELVIS W CONTRAST  Result Date: 03/26/2020 CLINICAL DATA:  Increasing abdominal pain since recent discharge EXAM: CT ABDOMEN AND PELVIS WITH CONTRAST TECHNIQUE: Multidetector CT imaging of the abdomen and pelvis was performed using the standard protocol following bolus administration of intravenous contrast. CONTRAST:  154mL OMNIPAQUE IOHEXOL 300 MG/ML  SOLN COMPARISON:  03/13/2020 FINDINGS: Lower chest: No acute abnormality. Hepatobiliary: Probable hepatic steatosis. No focal liver lesion. Gallbladder is unremarkable. No biliary dilatation. Pancreas: Unremarkable. Spleen: Unremarkable. Adrenals/Urinary Tract: Adrenals unremarkable. Punctate nonobstructing calculus of the upper pole the right kidney. There is right upper pole cortical scarring. Mild right hydroureteronephrosis. This is secondary to an obstructing 3 mm calculus of the distal ureter, which was present on the prior study and has  moved slightly inferiorly. Bladder is unremarkable. Stomach/Bowel: Stomach is within normal limits. There is persistent wall thickening involving the terminal ileum and adjacent distal ileum. Decreased cecal wall thickening. No bowel obstruction. Vascular/Lymphatic: Aortic atherosclerosis. No enlarged lymph nodes identified. Reproductive: Status post hysterectomy. No adnexal masses. Other: Small volume abdominopelvic free fluid. Abdominal wall is unremarkable. Musculoskeletal: No acute osseous abnormality. IMPRESSION: New mild right hydroureteronephrosis. Secondary to an obstructing 3 mm distal right ureteral calculus. This was present on the prior study and has moved slightly inferiorly/distally. Residual or recurrent infectious/inflammatory changes of the distal ileum. Decreased cecal wall thickening. Electronically Signed   By: Macy Mis M.D.   On: 03/26/2020 09:39      EKG: Independently reviewed.    A & P   Active Problems:   Dyslipidemia   Squamous cell cancer, anus (HCC)   Abdominal pain   Ileitis   Hydroureteronephrosis   Acute lower UTI   1. Ileitis, infectious versus radiation-induced a. Afebrile, hemodynamically stable on room air without leukocytosis b. Continue vancomycin and ciprofloxacin as below c. Morphine every 2 hours as needed for pain d. IV fluids  2. Obstructing right ureteral calculus with hydroureteronephrosis a. Urology consulted b. N.p.o. for possible upcoming procedure c. IV fluids  3. UTI with recent E. coli and MRSA UTI a. Continue vancomycin and Cipro b. Urine culture  4. Recurrent anal cancer s/p chemoradiation, last treatment 03/07/2020 a. Patient's oncologist, Dr. Burr Medico, notified  5. Palpitations a.  On propranolol outpatient, on holding due to n.p.o. status b. Metoprolol as needed  6. Hyperlipidemia a. Holding PO statin for now, restart when tolerating PO     DVT prophylaxis: lovenox   Code Status: Prior  Diet: NPO Family  Communication: Admission, patients condition and plan of care including tests being ordered have been discussed with the patient who indicates understanding and agrees with the plan and Code Status. Patient's husband was updated  Disposition Plan: The appropriate patient status for this patient is INPATIENT. Inpatient status is judged to be reasonable and necessary in order to provide the required intensity of service to ensure the patient's safety. The patient's presenting symptoms, physical exam findings, and initial radiographic and laboratory data in the context of their chronic comorbidities is felt to place them at high risk for further clinical deterioration. Furthermore, it is not anticipated that the patient will be medically stable for discharge from the hospital within 2 midnights of admission. The following factors support the patient status of inpatient.   " The patient's presenting symptoms include abdominal pain, dysuria. " The worrisome physical exam findings include abdominal pain. " The initial radiographic and laboratory data are worrisome because of right hydroureteronephrosis from obstructing calculus, UTI. " The chronic co-morbidities include anal cancer.   * I certify that at the point of admission it is my clinical judgment that the patient will require inpatient hospital care spanning beyond 2 midnights from the point of admission due to high intensity of service, high risk for further deterioration and high frequency of surveillance required.*   Consultants  . Urology . Oncology  Procedures  . none  Time Spent on Admission: 68 minutes    Harold Hedge, DO [facility]  03/26/2020, 11:07 AM

## 2020-03-26 NOTE — Progress Notes (Signed)
This Probation officer received report that pt had allergic reaction to Ciprofloxacin today and was given benadryl. Cipro is listed in pts allergies. Pharmacist was messaged to question this. Pharmacist states via med chat that Neysa Bonito wants to continue Cipro and give with 25 of benadryl. On call Sharlet Salina paged to question medication and benadryl due to pt being very drowsy from anesthesia and pain medications already. MD states Neysa Bonito wants it to continue and she feels it is safe to give. MD stated IV benadryl can be changed to 12.5 due to drowsiness. Will continue to monitor.

## 2020-03-26 NOTE — ED Triage Notes (Signed)
Pt sts lower abdominal pain since she was discharged on the 14th and pain increasingly got worse yesterday. Pt supposed to see oncologist today but could not manage pain. Pt took tramadol with no improvement. Hx anal cancer. Pt finished radiation/ chemo treatment on 10/1. Admitted for dehydration on 10/7-10/14. Told she may have colitis.

## 2020-03-26 NOTE — Transfer of Care (Signed)
Immediate Anesthesia Transfer of Care Note  Patient: TAMECA JEREZ  Procedure(s) Performed: CYSTOSCOPY/URETEROSCOPY/HOLMIUM LASER/STENT PLACEMENT (Right )  Patient Location: PACU  Anesthesia Type:General  Level of Consciousness: sedated, patient cooperative and responds to stimulation  Airway & Oxygen Therapy: Patient Spontanous Breathing and Patient connected to face mask oxygen  Post-op Assessment: Report given to RN and Post -op Vital signs reviewed and stable  Post vital signs: Reviewed and stable  Last Vitals:  Vitals Value Taken Time  BP 138/64 03/26/20 1531  Temp    Pulse 75 03/26/20 1533  Resp 11 03/26/20 1533  SpO2 100 % 03/26/20 1533  Vitals shown include unvalidated device data.  Last Pain:  Vitals:   03/26/20 1229  TempSrc: Oral  PainSc:       Patients Stated Pain Goal: 0 (75/05/10 7125)  Complications: No complications documented.

## 2020-03-26 NOTE — Anesthesia Postprocedure Evaluation (Signed)
Anesthesia Post Note  Patient: Melinda [name]  Procedure(s) Performed: CYSTOSCOPY/URETEROSCOPY/HOLMIUM LASER/STENT PLACEMENT (Right )     Patient location during evaluation: PACU Anesthesia Type: General Level of consciousness: awake and alert Pain management: pain level controlled Vital Signs Assessment: post-procedure vital signs reviewed and stable Respiratory status: spontaneous breathing, nonlabored ventilation, respiratory function stable and patient connected to nasal cannula oxygen Cardiovascular status: blood pressure returned to baseline and stable Postop Assessment: no apparent nausea or vomiting Anesthetic complications: no   No complications documented.  Last Vitals:  Vitals:   03/26/20 1615 03/26/20 1637  BP: 114/61 105/67  Pulse: 88 76  Resp: 17 16  Temp: 36.8 C (!) 36.4 C  SpO2: 100% 100%    Last Pain:  Vitals:   03/26/20 1637  TempSrc: Oral  PainSc:                  Tarig Zimmers DAVID

## 2020-03-26 NOTE — Progress Notes (Signed)
Patient is status post right distal ureteral stent removal.  No ureteral stent was left.  Because the kidney was noted to be draining well and there was minimal trauma during the extraction.  I did leave a Foley catheter because her bladder was very distended at the start of the case.  This catheter can probably be removed tomorrow.  The patient will be scheduled for follow-up to see me in 6 weeks.  Please contact urology for any additional questions or concerns.

## 2020-03-26 NOTE — Interval H&P Note (Signed)
History and Physical Interval Note:  03/26/2020 2:24 PM  Melinda [name]  has presented today for surgery, with the diagnosis of right ureteral stone.  The various methods of treatment have been discussed with the patient and family. After consideration of risks, benefits and other options for treatment, the patient has consented to  Procedure(s): CYSTOSCOPY/URETEROSCOPY/HOLMIUM LASER/STENT PLACEMENT (Right) as a surgical intervention.  The patient's history has been reviewed, patient examined, no change in status, stable for surgery.  I have reviewed the patient's chart and labs.  Questions were answered to the patient's satisfaction.     [name]

## 2020-03-26 NOTE — H&P (View-Only) (Signed)
I have been asked to see the patient by Dr. Davonna Belling, for evaluation and management of right distal ureteral stone.  History of present illness: 59 year old female who presents today with several weeks of right lower quadrant pain.  The patient has a history of anal cancer and is recently completed chemo and radiation for this.  She was recently admitted to the hospital 2 weeks prior for abdominal pain and diagnosed and treated for ileitis.  Her pain has been persistent since that time.  However, over the last several days it has intensified.  The patient feels pressure in her bladder.  She is also complaining of some flank pain.  She is having some nausea and vomiting.  She denies any fevers or chills.  She denies any associated worsening voiding symptoms.  She does have some dysuria, but she has had this since radiation and this seems to be improving.  She denies any low back pain.  In the ER the patient had labs that were essentially normal.  Her urine analysis was consistent with an obstructing stone, but there was no clear evidence of infection.  A CT scan demonstrated a 5 mm right UVJ stone.  Review of systems: A 12 point comprehensive review of systems was obtained and is negative unless otherwise stated in the history of present illness.  Patient Active Problem List   Diagnosis Date Noted  . Abdominal pain 03/26/2020  . Ileitis 03/26/2020  . Hydroureteronephrosis 03/26/2020  . Acute lower UTI 03/26/2020  . Intractable nausea and vomiting 03/13/2020  . Squamous cell cancer, anus ([place]) 01/16/2020  . Diabetes mellitus ([place]) 01/16/2020  . Goals of care, counseling/discussion 01/16/2020  . Chest pain 06/26/2012  . Iron deficiency anemia 06/22/2012  . Near syncope 06/20/2012  . Heart palpitations 04/28/2011  . Hypertension 04/28/2011  . Dyslipidemia 04/28/2011    No current facility-administered medications on file prior to encounter.   Current Outpatient Medications on File  Prior to Encounter  Medication Sig Dispense Refill  . clorazepate (TRANXENE) 7.5 MG tablet Take 7.5 mg by mouth 2 (two) times daily.  1  . CRESTOR 5 MG tablet Take 5 mg by mouth every evening.   1  . esomeprazole (NEXIUM) 40 MG capsule Take 40 mg by mouth daily at 12 noon.    . feeding supplement (ENSURE ENLIVE / ENSURE PLUS) LIQD Take 237 mLs by mouth daily. 237 mL 12  . FLUoxetine (PROZAC) 20 MG capsule Take 20 mg by mouth every morning.    . Loperamide HCl (IMODIUM PO) Take 2 capsules by mouth every 6 (six) hours as needed (loose stool).     . methenamine (HIPREX) 1 g tablet Take 1 g by mouth daily.     [name] NU-IRON 150 MG capsule Take 150 mg by mouth daily.   2  . ondansetron (ZOFRAN) 8 MG tablet Take 1 tablet (8 mg total) by mouth 2 (two) times daily as needed (Nausea or vomiting). (Patient taking differently: Take 8 mg by mouth 2 (two) times daily as needed for nausea or vomiting. ) 30 tablet 1  . phenazopyridine (PYRIDIUM) 100 MG tablet Take 1 tablet (100 mg total) by mouth 3 (three) times daily as needed for pain. 10 tablet 0  . prochlorperazine (COMPAZINE) 10 MG tablet Take 1 tablet (10 mg total) by mouth every 6 (six) hours as needed (Nausea or vomiting). (Patient taking differently: Take 10 mg by mouth every 6 (six) hours as needed for nausea. ) 30 tablet 1  .  propranolol (INDERAL) 10 MG tablet TAKE 1 TABLET BY MOUTH TWICE A DAY (Patient taking differently: Take 10 mg by mouth 2 (two) times daily. ) 180 tablet 2  . protein supplement (RESOURCE BENEPROTEIN) 6 g POWD Take 1 Scoop (6 g total) by mouth 3 (three) times daily with meals. 50 packet 0  . Pumpkin Seed-Soy Germ (AZO BLADDER CONTROL/GO-LESS PO) Take 1 tablet by mouth daily as needed (pain & irritation).    . silver sulfADIAZINE (SILVADENE) 1 % cream Apply 1 application topically daily. 400 g 0  . sulfamethoxazole-trimethoprim (BACTRIM DS) 800-160 MG tablet Take 1 tablet by mouth 2 (two) times daily. 14 tablet 0  . traMADol (ULTRAM) 50  MG tablet Take 50 mg by mouth every 6 (six) hours as needed for moderate pain.    . diphenoxylate-atropine (LOMOTIL) 2.5-0.025 MG tablet Take 1-2 tablets by mouth 4 (four) times daily as needed for diarrhea or loose stools. (Patient not taking: Reported on 02/25/2020) 60 tablet 1  . hyoscyamine (ANASPAZ) 0.125 MG TBDP disintergrating tablet Place 1 tablet (0.125 mg total) under the tongue every 6 (six) hours as needed. (Patient not taking: Reported on 03/13/2020) 30 tablet 2  . metFORMIN (GLUCOPHAGE) 500 MG tablet Take 500 mg by mouth 2 (two) times daily with a meal.     . oxyCODONE (OXY IR/ROXICODONE) 5 MG immediate release tablet Take 1 tablet (5 mg total) by mouth every 4 (four) hours as needed for severe pain. (Patient not taking: Reported on 03/26/2020) 30 tablet 0    Past Medical History:  Diagnosis Date  . Diabetes mellitus type 2, controlled ([place])   . Dyslipidemia    untreated  . GERD (gastroesophageal reflux disease)   . Hemorrhoids   . Microcytic anemia 06/22/2012  . Nephrolith   . Palpitation    positive tilt table test. Prozac and Inderal treatment effective.    Past Surgical History:  Procedure Laterality Date  . APPENDECTOMY    . BREAST REDUCTION SURGERY    . CARDIAC CATHETERIZATION  2002   normal LV function and no significant coronary obstruction  . CATARACT EXTRACTION, BILATERAL Bilateral november 2020 and dcember 2020  . CYST EXCISION N/A 01/11/2020   Procedure: EXCISION OF ANAL CANAL MASS;  Surgeon: Ileana Roup, MD;  Location: [facility];  Service: General;  Laterality: N/A;  . ESOPHAGEAL MANOMETRY N/A 04/04/2017   Procedure: ESOPHAGEAL MANOMETRY (EM);  Surgeon: Laurence Spates, MD;  Location: [facility];  Service: Endoscopy;  Laterality: N/A;  . HEMORRHOID SURGERY N/A 07/13/2019   Procedure: HEMORRHOIDECTOMY;  Surgeon: Johnathan Hausen, MD;  Location: [facility];  Service: General;  Laterality: N/A;  . KIDNEY SURGERY     kidney stones   . PARTIAL  HYSTERECTOMY    . RECTAL EXAM UNDER ANESTHESIA N/A 01/11/2020   Procedure: RECTAL EXAM UNDER ANESTHESIA;  Surgeon: Ileana Roup, MD;  Location: [facility];  Service: General;  Laterality: N/A;  . TONSILLECTOMY      Social History   Tobacco Use  . Smoking status: Former Smoker    Types: E-cigarettes    Quit date: 07/08/2017    Years since quitting: 2.7  . Smokeless tobacco: Never Used  . Tobacco comment: uses electronic  Vaping Use  . Vaping Use: Former  . Quit date: 07/03/2016  Substance Use Topics  . Alcohol use: No  . Drug use: No    Family History  Problem Relation Age of Onset  . Heart disease Mother   . Liver disease  Mother   . Heart disease Father   . Heart attack Father   . Cancer Father        prostate CA  . Cancer Maternal Grandmother 60       ovarian cancer  . Coronary artery disease Other        Fx of  . Heart attack Other   . Diabetes Other     PE: Vitals:   03/26/20 1100 03/26/20 1115 03/26/20 1145 03/26/20 1205  BP: 113/70 108/68 101/63 106/67  Pulse: 72 61 76 67  Resp: 16 14  15   Temp:    98.3 F (36.8 C)  TempSrc:    Oral  SpO2: 95% 97% 98% 97%  Weight:      Height:       Patient appears to be in no acute distress  patient is alert and oriented x3 Atraumatic normocephalic head No cervical or supraclavicular lymphadenopathy appreciated No increased work of breathing, no audible wheezes/rhonchi Regular sinus rhythm/rate Abdomen is soft, tender to palpation, CVA tenderness as well in the right Lower extremities are symmetric without appreciable edema Grossly neurologically intact No identifiable skin lesions  Recent Labs    03/25/20 1436 03/26/20 0129  WBC 3.3* 4.4  HGB 11.8* 11.8*  HCT 37.1 36.4   Recent Labs    03/25/20 1436 03/26/20 0129  NA 136 134*  K 4.3 3.9  CL 103 103  CO2 28 21*  GLUCOSE 119* 143*  BUN 15 18  CREATININE 0.84 0.77  CALCIUM 9.7 9.1   No results for input(s): LABPT, INR in the last 72 hours. No  results for input(s): LABURIN in the last 72 hours. Results for orders placed or performed during the hospital encounter of 03/26/20  Respiratory Panel by RT PCR (Flu A&B, Covid) - Nasopharyngeal Swab     Status: None   Collection Time: 03/26/20 10:24 AM   Specimen: Nasopharyngeal Swab  Result Value Ref Range Status   SARS Coronavirus 2 by RT PCR NEGATIVE NEGATIVE Final    Comment: (NOTE) SARS-CoV-2 target nucleic acids are NOT DETECTED.  The SARS-CoV-2 RNA is generally detectable in upper respiratoy specimens during the acute phase of infection. The lowest concentration of SARS-CoV-2 viral copies this assay can detect is 131 copies/mL. A negative result does not preclude SARS-Cov-2 infection and should not be used as the sole basis for treatment or other patient management decisions. A negative result may occur with  improper specimen collection/handling, submission of specimen other than nasopharyngeal swab, presence of viral mutation(s) within the areas targeted by this assay, and inadequate number of viral copies (<131 copies/mL). A negative result must be combined with clinical observations, patient history, and epidemiological information. The expected result is Negative.  Fact Sheet for Patients:  PinkCheek.be  Fact Sheet for Healthcare Providers:  GravelBags.it  This test is no t yet approved or cleared by the [place] FDA and  has been authorized for detection and/or diagnosis of SARS-CoV-2 by FDA under an Emergency Use Authorization (EUA). This EUA will remain  in effect (meaning this test can be used) for the duration of the COVID-19 declaration under Section 564(b)(1) of the Act, 21 U.S.C. section 360bbb-3(b)(1), unless the authorization is terminated or revoked sooner.     Influenza A by PCR NEGATIVE NEGATIVE Final   Influenza B by PCR NEGATIVE NEGATIVE Final    Comment: (NOTE) The Xpert Xpress  SARS-CoV-2/FLU/RSV assay is intended as an aid in  the diagnosis of influenza from Nasopharyngeal swab specimens  and  should not be used as a sole basis for treatment. Nasal washings and  aspirates are unacceptable for Xpert Xpress SARS-CoV-2/FLU/RSV  testing.  Fact Sheet for Patients: PinkCheek.be  Fact Sheet for Healthcare Providers: GravelBags.it  This test is not yet approved or cleared by the [place] FDA and  has been authorized for detection and/or diagnosis of SARS-CoV-2 by  FDA under an Emergency Use Authorization (EUA). This EUA will remain  in effect (meaning this test can be used) for the duration of the  Covid-19 declaration under Section 564(b)(1) of the Act, 21  U.S.C. section 360bbb-3(b)(1), unless the authorization is  terminated or revoked. Performed at [facility], [place] [address], [place], [place] [address]     Imaging: I reviewed the patient's CT scan obtained on 10/20 which demonstrates a 5 mm right distal ureteral stone with proximal hydroureteronephrosis.  In addition the patient continues to have persistent ileitis.  In comparison to the CT scan obtained on 10/7, there is no hydronephrosis.  The patient had a stone in the distal ureter that was nonobstructing and not creating any hydronephrosis.  Imp: The patient has abdominal pain which is likely from her persistent ileitis.  She also has a obstructing right distal ureteral stone.  I do not see any clear evidence of infection within the urinary tract.  However, she does have persistent pain and nausea.  Recommendations: The plan is to take the patient to the OR this afternoon and remove her right distal ureteral stone. hopefully, this will allow her to have better pain control for her ileitis and make that easier to manage.  We will also minimize her risk for developing urosepsis in an immunocompromised state.    [name]

## 2020-03-26 NOTE — Anesthesia Preprocedure Evaluation (Signed)
Anesthesia Evaluation  Patient identified by MRN, date of birth, ID band Patient awake    Reviewed: Allergy & Precautions, NPO status , Patient's Chart, lab work & pertinent test results  Airway Mallampati: I  TM Distance: >3 FB Neck ROM: Full    Dental   Pulmonary former smoker,    Pulmonary exam normal        Cardiovascular hypertension, Pt. on medications Normal cardiovascular exam     Neuro/Psych    GI/Hepatic GERD  Medicated and Controlled,  Endo/Other  diabetes, Type 2, Oral Hypoglycemic Agents  Renal/GU      Musculoskeletal   Abdominal   Peds  Hematology   Anesthesia Other Findings   Reproductive/Obstetrics                             Anesthesia Physical Anesthesia Plan  ASA: II  Anesthesia Plan: General   Post-op Pain Management:    Induction: Intravenous  PONV Risk Score and Plan: 3 and Ondansetron, Midazolam and Treatment may vary due to age or medical condition  Airway Management Planned: LMA  Additional Equipment:   Intra-op Plan:   Post-operative Plan: Extubation in OR  Informed Consent: I have reviewed the patients History and Physical, chart, labs and discussed the procedure including the risks, benefits and alternatives for the proposed anesthesia with the patient or authorized representative who has indicated his/her understanding and acceptance.       Plan Discussed with: CRNA and Surgeon  Anesthesia Plan Comments:         Anesthesia Quick Evaluation

## 2020-03-26 NOTE — Progress Notes (Signed)
Attempted to call ED for report---no answer.

## 2020-03-26 NOTE — Anesthesia Procedure Notes (Signed)
Procedure Name: LMA Insertion Performed by: Gean Maidens, CRNA Pre-anesthesia Checklist: Patient identified, Emergency Drugs available, Suction available, Timeout performed and Patient being monitored Patient Re-evaluated:Patient Re-evaluated prior to induction Oxygen Delivery Method: Circle system utilized Preoxygenation: Pre-oxygenation with 100% oxygen Induction Type: IV induction Ventilation: Mask ventilation without difficulty LMA: LMA inserted LMA Size: 4.0 Number of attempts: 1 Placement Confirmation: positive ETCO2 and breath sounds checked- equal and bilateral Tube secured with: Tape Dental Injury: Teeth and Oropharynx as per pre-operative assessment

## 2020-03-26 NOTE — ED Notes (Signed)
Patient given mouth swab and warm wash cloths to clean.

## 2020-03-26 NOTE — Consult Note (Signed)
I have been asked to see the patient by Dr. Davonna Belling, for evaluation and management of right distal ureteral stone.  History of present illness: 59 year old female who presents today with several weeks of right lower quadrant pain.  The patient has a history of anal cancer and is recently completed chemo and radiation for this.  She was recently admitted to the hospital 2 weeks prior for abdominal pain and diagnosed and treated for ileitis.  Her pain has been persistent since that time.  However, over the last several days it has intensified.  The patient feels pressure in her bladder.  She is also complaining of some flank pain.  She is having some nausea and vomiting.  She denies any fevers or chills.  She denies any associated worsening voiding symptoms.  She does have some dysuria, but she has had this since radiation and this seems to be improving.  She denies any low back pain.  In the ER the patient had labs that were essentially normal.  Her urine analysis was consistent with an obstructing stone, but there was no clear evidence of infection.  A CT scan demonstrated a 5 mm right UVJ stone.  Review of systems: A 12 point comprehensive review of systems was obtained and is negative unless otherwise stated in the history of present illness.  Patient Active Problem List   Diagnosis Date Noted  . Abdominal pain 03/26/2020  . Ileitis 03/26/2020  . Hydroureteronephrosis 03/26/2020  . Acute lower UTI 03/26/2020  . Intractable nausea and vomiting 03/13/2020  . Squamous cell cancer, anus ([place]) 01/16/2020  . Diabetes mellitus ([place]) 01/16/2020  . Goals of care, counseling/discussion 01/16/2020  . Chest pain 06/26/2012  . Iron deficiency anemia 06/22/2012  . Near syncope 06/20/2012  . Heart palpitations 04/28/2011  . Hypertension 04/28/2011  . Dyslipidemia 04/28/2011    No current facility-administered medications on file prior to encounter.   Current Outpatient Medications on File  Prior to Encounter  Medication Sig Dispense Refill  . clorazepate (TRANXENE) 7.5 MG tablet Take 7.5 mg by mouth 2 (two) times daily.  1  . CRESTOR 5 MG tablet Take 5 mg by mouth every evening.   1  . esomeprazole (NEXIUM) 40 MG capsule Take 40 mg by mouth daily at 12 noon.    . feeding supplement (ENSURE ENLIVE / ENSURE PLUS) LIQD Take 237 mLs by mouth daily. 237 mL 12  . FLUoxetine (PROZAC) 20 MG capsule Take 20 mg by mouth every morning.    . Loperamide HCl (IMODIUM PO) Take 2 capsules by mouth every 6 (six) hours as needed (loose stool).     . methenamine (HIPREX) 1 g tablet Take 1 g by mouth daily.     [name] NU-IRON 150 MG capsule Take 150 mg by mouth daily.   2  . ondansetron (ZOFRAN) 8 MG tablet Take 1 tablet (8 mg total) by mouth 2 (two) times daily as needed (Nausea or vomiting). (Patient taking differently: Take 8 mg by mouth 2 (two) times daily as needed for nausea or vomiting. ) 30 tablet 1  . phenazopyridine (PYRIDIUM) 100 MG tablet Take 1 tablet (100 mg total) by mouth 3 (three) times daily as needed for pain. 10 tablet 0  . prochlorperazine (COMPAZINE) 10 MG tablet Take 1 tablet (10 mg total) by mouth every 6 (six) hours as needed (Nausea or vomiting). (Patient taking differently: Take 10 mg by mouth every 6 (six) hours as needed for nausea. ) 30 tablet 1  .  propranolol (INDERAL) 10 MG tablet TAKE 1 TABLET BY MOUTH TWICE A DAY (Patient taking differently: Take 10 mg by mouth 2 (two) times daily. ) 180 tablet 2  . protein supplement (RESOURCE BENEPROTEIN) 6 g POWD Take 1 Scoop (6 g total) by mouth 3 (three) times daily with meals. 50 packet 0  . Pumpkin Seed-Soy Germ (AZO BLADDER CONTROL/GO-LESS PO) Take 1 tablet by mouth daily as needed (pain & irritation).    . silver sulfADIAZINE (SILVADENE) 1 % cream Apply 1 application topically daily. 400 g 0  . sulfamethoxazole-trimethoprim (BACTRIM DS) 800-160 MG tablet Take 1 tablet by mouth 2 (two) times daily. 14 tablet 0  . traMADol (ULTRAM) 50  MG tablet Take 50 mg by mouth every 6 (six) hours as needed for moderate pain.    . diphenoxylate-atropine (LOMOTIL) 2.5-0.025 MG tablet Take 1-2 tablets by mouth 4 (four) times daily as needed for diarrhea or loose stools. (Patient not taking: Reported on 02/25/2020) 60 tablet 1  . hyoscyamine (ANASPAZ) 0.125 MG TBDP disintergrating tablet Place 1 tablet (0.125 mg total) under the tongue every 6 (six) hours as needed. (Patient not taking: Reported on 03/13/2020) 30 tablet 2  . metFORMIN (GLUCOPHAGE) 500 MG tablet Take 500 mg by mouth 2 (two) times daily with a meal.     . oxyCODONE (OXY IR/ROXICODONE) 5 MG immediate release tablet Take 1 tablet (5 mg total) by mouth every 4 (four) hours as needed for severe pain. (Patient not taking: Reported on 03/26/2020) 30 tablet 0    Past Medical History:  Diagnosis Date  . Diabetes mellitus type 2, controlled ([place])   . Dyslipidemia    untreated  . GERD (gastroesophageal reflux disease)   . Hemorrhoids   . Microcytic anemia 06/22/2012  . Nephrolith   . Palpitation    positive tilt table test. Prozac and Inderal treatment effective.    Past Surgical History:  Procedure Laterality Date  . APPENDECTOMY    . BREAST REDUCTION SURGERY    . CARDIAC CATHETERIZATION  2002   normal LV function and no significant coronary obstruction  . CATARACT EXTRACTION, BILATERAL Bilateral november 2020 and dcember 2020  . CYST EXCISION N/A 01/11/2020   Procedure: EXCISION OF ANAL CANAL MASS;  Surgeon: Ileana Roup, MD;  Location: [facility];  Service: General;  Laterality: N/A;  . ESOPHAGEAL MANOMETRY N/A 04/04/2017   Procedure: ESOPHAGEAL MANOMETRY (EM);  Surgeon: Laurence Spates, MD;  Location: [facility];  Service: Endoscopy;  Laterality: N/A;  . HEMORRHOID SURGERY N/A 07/13/2019   Procedure: HEMORRHOIDECTOMY;  Surgeon: Johnathan Hausen, MD;  Location: [facility];  Service: General;  Laterality: N/A;  . KIDNEY SURGERY     kidney stones   . PARTIAL  HYSTERECTOMY    . RECTAL EXAM UNDER ANESTHESIA N/A 01/11/2020   Procedure: RECTAL EXAM UNDER ANESTHESIA;  Surgeon: Ileana Roup, MD;  Location: [facility];  Service: General;  Laterality: N/A;  . TONSILLECTOMY      Social History   Tobacco Use  . Smoking status: Former Smoker    Types: E-cigarettes    Quit date: 07/08/2017    Years since quitting: 2.7  . Smokeless tobacco: Never Used  . Tobacco comment: uses electronic  Vaping Use  . Vaping Use: Former  . Quit date: 07/03/2016  Substance Use Topics  . Alcohol use: No  . Drug use: No    Family History  Problem Relation Age of Onset  . Heart disease Mother   . Liver disease  Mother   . Heart disease Father   . Heart attack Father   . Cancer Father        prostate CA  . Cancer Maternal Grandmother 60       ovarian cancer  . Coronary artery disease Other        Fx of  . Heart attack Other   . Diabetes Other     PE: Vitals:   03/26/20 1100 03/26/20 1115 03/26/20 1145 03/26/20 1205  BP: 113/70 108/68 101/63 106/67  Pulse: 72 61 76 67  Resp: 16 14  15   Temp:    98.3 F (36.8 C)  TempSrc:    Oral  SpO2: 95% 97% 98% 97%  Weight:      Height:       Patient appears to be in no acute distress  patient is alert and oriented x3 Atraumatic normocephalic head No cervical or supraclavicular lymphadenopathy appreciated No increased work of breathing, no audible wheezes/rhonchi Regular sinus rhythm/rate Abdomen is soft, tender to palpation, CVA tenderness as well in the right Lower extremities are symmetric without appreciable edema Grossly neurologically intact No identifiable skin lesions  Recent Labs    03/25/20 1436 03/26/20 0129  WBC 3.3* 4.4  HGB 11.8* 11.8*  HCT 37.1 36.4   Recent Labs    03/25/20 1436 03/26/20 0129  NA 136 134*  K 4.3 3.9  CL 103 103  CO2 28 21*  GLUCOSE 119* 143*  BUN 15 18  CREATININE 0.84 0.77  CALCIUM 9.7 9.1   No results for input(s): LABPT, INR in the last 72 hours. No  results for input(s): LABURIN in the last 72 hours. Results for orders placed or performed during the hospital encounter of 03/26/20  Respiratory Panel by RT PCR (Flu A&B, Covid) - Nasopharyngeal Swab     Status: None   Collection Time: 03/26/20 10:24 AM   Specimen: Nasopharyngeal Swab  Result Value Ref Range Status   SARS Coronavirus 2 by RT PCR NEGATIVE NEGATIVE Final    Comment: (NOTE) SARS-CoV-2 target nucleic acids are NOT DETECTED.  The SARS-CoV-2 RNA is generally detectable in upper respiratoy specimens during the acute phase of infection. The lowest concentration of SARS-CoV-2 viral copies this assay can detect is 131 copies/mL. A negative result does not preclude SARS-Cov-2 infection and should not be used as the sole basis for treatment or other patient management decisions. A negative result may occur with  improper specimen collection/handling, submission of specimen other than nasopharyngeal swab, presence of viral mutation(s) within the areas targeted by this assay, and inadequate number of viral copies (<131 copies/mL). A negative result must be combined with clinical observations, patient history, and epidemiological information. The expected result is Negative.  Fact Sheet for Patients:  PinkCheek.be  Fact Sheet for Healthcare Providers:  GravelBags.it  This test is no t yet approved or cleared by the [place] FDA and  has been authorized for detection and/or diagnosis of SARS-CoV-2 by FDA under an Emergency Use Authorization (EUA). This EUA will remain  in effect (meaning this test can be used) for the duration of the COVID-19 declaration under Section 564(b)(1) of the Act, 21 U.S.C. section 360bbb-3(b)(1), unless the authorization is terminated or revoked sooner.     Influenza A by PCR NEGATIVE NEGATIVE Final   Influenza B by PCR NEGATIVE NEGATIVE Final    Comment: (NOTE) The Xpert Xpress  SARS-CoV-2/FLU/RSV assay is intended as an aid in  the diagnosis of influenza from Nasopharyngeal swab specimens  and  should not be used as a sole basis for treatment. Nasal washings and  aspirates are unacceptable for Xpert Xpress SARS-CoV-2/FLU/RSV  testing.  Fact Sheet for Patients: PinkCheek.be  Fact Sheet for Healthcare Providers: GravelBags.it  This test is not yet approved or cleared by the [place] FDA and  has been authorized for detection and/or diagnosis of SARS-CoV-2 by  FDA under an Emergency Use Authorization (EUA). This EUA will remain  in effect (meaning this test can be used) for the duration of the  Covid-19 declaration under Section 564(b)(1) of the Act, 21  U.S.C. section 360bbb-3(b)(1), unless the authorization is  terminated or revoked. Performed at [facility], [place] [address] [place], [place] [address]     Imaging: I reviewed the patient's CT scan obtained on 10/20 which demonstrates a 5 mm right distal ureteral stone with proximal hydroureteronephrosis.  In addition the patient continues to have persistent ileitis.  In comparison to the CT scan obtained on 10/7, there is no hydronephrosis.  The patient had a stone in the distal ureter that was nonobstructing and not creating any hydronephrosis.  Imp: The patient has abdominal pain which is likely from her persistent ileitis.  She also has a obstructing right distal ureteral stone.  I do not see any clear evidence of infection within the urinary tract.  However, she does have persistent pain and nausea.  Recommendations: The plan is to take the patient to the OR this afternoon and remove her right distal ureteral stone. hopefully, this will allow her to have better pain control for her ileitis and make that easier to manage.  We will also minimize her risk for developing urosepsis in an immunocompromised state.    [name]

## 2020-03-26 NOTE — ED Provider Notes (Signed)
Everything was going well until 5:00 [name] Provider Note   CSN: [id number] Arrival date & time: 03/26/20  0110     History Chief Complaint  Patient presents with  . Abdominal Pain    Melinda [name] is a 59 y.o. female.  HPI     59 year old female with pmhx of recurrent anal cancer, presented with abdominal pain.  Patient has been having abdominal pain, mostly in the lower quadrants for the last week.  She was discharged from the hospital on 10/14, admission was for colitis secondary to radiation therapy.  She was also thought to have cystitis and MRSA infection for which she had received antibiotics.  After being discharged her pain has started getting worse again.  Her pain is described as sharp pain, 10 out of 10, nonradiating.  She has had reduced p.o. intake.  She has tried tramadol without any relief.  She spoke with cancer team yesterday and was told that she might have radiation cystitis that is contributing to her pain.  She is on Bactrim now.  She is taking Oxy that was prescribed for pain but typically it does not help her with pain, just makes her sleepy.  She is having about 3 loose bowel movements a day. Past Medical History:  Diagnosis Date  . Diabetes mellitus type 2, controlled ([place])   . Dyslipidemia    untreated  . GERD (gastroesophageal reflux disease)   . Hemorrhoids   . Microcytic anemia 06/22/2012  . Nephrolith   . Palpitation    positive tilt table test. Prozac and Inderal treatment effective.    Patient Active Problem List   Diagnosis Date Noted  . Intractable nausea and vomiting 03/13/2020  . Squamous cell cancer, anus ([place]) 01/16/2020  . Diabetes mellitus ([place]) 01/16/2020  . Goals of care, counseling/discussion 01/16/2020  . Chest pain 06/26/2012  . Iron deficiency anemia 06/22/2012  . Near syncope 06/20/2012  . Heart palpitations 04/28/2011  . Hypertension 04/28/2011  . Dyslipidemia 04/28/2011    Past  Surgical History:  Procedure Laterality Date  . APPENDECTOMY    . BREAST REDUCTION SURGERY    . CARDIAC CATHETERIZATION  2002   normal LV function and no significant coronary obstruction  . CATARACT EXTRACTION, BILATERAL Bilateral november 2020 and dcember 2020  . CYST EXCISION N/A 01/11/2020   Procedure: EXCISION OF ANAL CANAL MASS;  Surgeon: Ileana Roup, MD;  [facility];  Service: General;  Laterality: N/A;  . ESOPHAGEAL MANOMETRY N/A 04/04/2017   Procedure: ESOPHAGEAL MANOMETRY (EM);  Surgeon: Laurence Spates, MD;  [facility];  Service: Endoscopy;  Laterality: N/A;  . HEMORRHOID SURGERY N/A 07/13/2019   Procedure: HEMORRHOIDECTOMY;  Surgeon: Johnathan Hausen, MD;  Location: [facility];  Service: General;  Laterality: N/A;  . KIDNEY SURGERY     kidney stones   . PARTIAL HYSTERECTOMY    . RECTAL EXAM UNDER ANESTHESIA N/A 01/11/2020   Procedure: RECTAL EXAM UNDER ANESTHESIA;  Surgeon: Ileana Roup, MD;  [facility];  Service: General;  Laterality: N/A;  . TONSILLECTOMY       OB History   No obstetric history on file.     Family History  Problem Relation Age of Onset  . Heart disease Mother   . Liver disease Mother   . Heart disease Father   . Heart attack Father   . Cancer Father        prostate CA  . Cancer Maternal Grandmother 69  ovarian cancer  . Coronary artery disease Other        Fx of  . Heart attack Other   . Diabetes Other     Social History   Tobacco Use  . Smoking status: Former Smoker    Types: E-cigarettes    Quit date: 07/08/2017    Years since quitting: 2.7  . Smokeless tobacco: Never Used  . Tobacco comment: uses electronic  Vaping Use  . Vaping Use: Former  . Quit date: 07/03/2016  Substance Use Topics  . Alcohol use: No  . Drug use: No    Home Medications Prior to Admission medications   Medication Sig Start Date End Date Taking? Authorizing Provider  clorazepate (TRANXENE) 7.5 MG tablet  Take 7.5 mg by mouth 2 (two) times daily. 02/12/18  Yes [provider]  CRESTOR 5 MG tablet Take 5 mg by mouth every evening.  08/10/14  Yes [provider]  esomeprazole (NEXIUM) 40 MG capsule Take 40 mg by mouth daily at 12 noon.   Yes [provider]  feeding supplement (ENSURE ENLIVE / ENSURE PLUS) LIQD Take 237 mLs by mouth daily. 03/21/20  Yes Hongalgi, Lenis Dickinson, MD  FLUoxetine (PROZAC) 20 MG capsule Take 20 mg by mouth every morning. 10/23/19  Yes [provider]  Loperamide HCl (IMODIUM PO) Take 2 capsules by mouth every 6 (six) hours as needed (loose stool).    Yes [provider]  methenamine (HIPREX) 1 g tablet Take 1 g by mouth daily.    Yes [provider]  NU-IRON 150 MG capsule Take 150 mg by mouth daily.  02/02/17  Yes [provider]  ondansetron (ZOFRAN) 8 MG tablet Take 1 tablet (8 mg total) by mouth 2 (two) times daily as needed (Nausea or vomiting). Patient taking differently: Take 8 mg by mouth 2 (two) times daily as needed for nausea or vomiting.  01/17/20  Yes Truitt Merle, MD  phenazopyridine (PYRIDIUM) 100 MG tablet Take 1 tablet (100 mg total) by mouth 3 (three) times daily as needed for pain. 03/25/20  Yes Tanner, Lyndon Code., PA-C  prochlorperazine (COMPAZINE) 10 MG tablet Take 1 tablet (10 mg total) by mouth every 6 (six) hours as needed (Nausea or vomiting). Patient taking differently: Take 10 mg by mouth every 6 (six) hours as needed for nausea.  01/17/20  Yes Truitt Merle, MD  propranolol (INDERAL) 10 MG tablet TAKE 1 TABLET BY MOUTH TWICE A DAY Patient taking differently: Take 10 mg by mouth 2 (two) times daily.  07/11/19  Yes Fay Records, MD  protein supplement (RESOURCE BENEPROTEIN) 6 g POWD Take 1 Scoop (6 g total) by mouth 3 (three) times daily with meals. 03/20/20  Yes Hongalgi, Lenis Dickinson, MD  Pumpkin Seed-Soy Germ (AZO BLADDER CONTROL/GO-LESS PO) Take 1 tablet by mouth daily as needed (pain & irritation).   Yes  [provider]  silver sulfADIAZINE (SILVADENE) 1 % cream Apply 1 application topically daily. 02/21/20  Yes Tanner, Lyndon Code., PA-C  sulfamethoxazole-trimethoprim (BACTRIM DS) 800-160 MG tablet Take 1 tablet by mouth 2 (two) times daily. 03/25/20  Yes Tanner, Lyndon Code., PA-C  traMADol (ULTRAM) 50 MG tablet Take 50 mg by mouth every 6 (six) hours as needed for moderate pain.   Yes [provider]  diphenoxylate-atropine (LOMOTIL) 2.5-0.025 MG tablet Take 1-2 tablets by mouth 4 (four) times daily as needed for diarrhea or loose stools. Patient not taking: Reported on 02/25/2020 02/15/20   Truitt Merle, MD  hyoscyamine (ANASPAZ) 0.125 MG TBDP disintergrating tablet Place 1 tablet (0.125 mg total) under the tongue every 6 (six) hours as needed. Patient not taking: Reported on 03/13/2020 02/21/20   Harle Stanford., PA-C  metFORMIN (GLUCOPHAGE) 500 MG tablet Take 500 mg by mouth 2 (two) times daily with a meal.     [provider]  oxyCODONE (OXY IR/ROXICODONE) 5 MG immediate release tablet Take 1 tablet (5 mg total) by mouth every 4 (four) hours as needed for severe pain. Patient not taking: Reported on 03/26/2020 03/10/20   Truitt Merle, MD    Allergies    Cephalexin, Amoxapine and related, Amoxicillin-pot clavulanate, Atorvastatin, Cefaclor, Ciprofloxacin, Codeine, Doxycycline calcium, Fenofibrate micronized, Fish oil, Flagyl [metronidazole hcl], Levofloxacin in d5w, Metronidazole, Ofloxacin, Prednisolone, Promethazine hcl, Tape, Tetracycline hcl, Erythromycin, and Nitrofurantoin  Review of Systems   Review of Systems  Constitutional: Positive for activity change.  Respiratory: Negative for shortness of breath.   Cardiovascular: Negative for chest pain.  Gastrointestinal: Positive for abdominal pain, diarrhea and nausea.  Genitourinary: Negative for dysuria, flank pain and hematuria.  Allergic/Immunologic: Positive for immunocompromised state.  All other systems reviewed and are  negative.   Physical Exam Updated Vital Signs BP 103/69   Pulse 66   Temp 98.4 F (36.9 C) (Oral)   Resp 15   Ht 5\' 6"  (1.676 m)   Wt 66.4 kg   SpO2 95%   BMI 23.61 kg/m   Physical Exam Vitals and nursing note reviewed.  Constitutional:      Appearance: She is well-developed.  HENT:     Head: Normocephalic and atraumatic.  Cardiovascular:     Rate and Rhythm: Normal rate.  Pulmonary:     Effort: Pulmonary effort is normal.  Abdominal:     General: Bowel sounds are normal. There is no distension.     Palpations: Abdomen is soft.     Tenderness: There is abdominal tenderness in the suprapubic area. There is guarding. There is no rebound.  Musculoskeletal:     Cervical back: Normal range of motion and neck supple.  Skin:    General: Skin is warm and dry.  Neurological:     Mental Status: She is alert and oriented to person, place, and time.     ED Results / Procedures / Treatments   Labs (all labs ordered are listed, but only abnormal results are displayed) Labs Reviewed  COMPREHENSIVE METABOLIC PANEL - Abnormal; Notable for the following components:      Result Value   Sodium 134 (*)    CO2 21 (*)    Glucose, Bld 143 (*)    All other components within normal limits  CBC - Abnormal; Notable for the following components:   Hemoglobin 11.8 (*)    RDW 16.0 (*)    All other components within normal limits  URINALYSIS, ROUTINE W REFLEX MICROSCOPIC - Abnormal; Notable for the following components:   APPearance HAZY (*)    Hgb urine dipstick MODERATE (*)    Ketones, ur 5 (*)    Protein, ur 30 (*)    Leukocytes,Ua SMALL (*)    Bacteria, UA RARE (*)    All other components within normal limits  LIPASE, BLOOD    EKG None  Radiology No results found.  Procedures Procedures (including critical care time)  Medications Ordered in ED Medications  morphine (MSIR) tablet 15 mg (15 mg Oral Given 03/26/20 0628)  morphine 4 MG/ML injection 6 mg (6 mg Intravenous  Given 03/26/20 0359)  lactated ringers bolus 1,000 mL (0 mLs Intravenous Stopped 03/26/20 0628)  sodium chloride 0.9 % bolus 1,000 mL (1,000 mLs Intravenous New Bag/Given 03/26/20 8875)    ED Course  I have reviewed the triage vital signs and the nursing notes.  Pertinent labs & imaging results that were available during my care of the patient were reviewed by me and considered in my medical decision making (see chart for details).    MDM Rules/Calculators/A&P                          59 year old female with history of anal squamous cell carcinoma status post chemoradiation comes in a chief complaint of lower quadrant abdominal pain.  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  Patient also had recent bout of UTI and MRSA infection.  She is currently on Bactrim after being discharged post antibiotic treatment on 10-14.  She is coming in primarily for lower quadrant abdominal pain.  Her abdomen is soft, it does not appear that she has any further complication at this time.  It appears that the pain control has been a major issue.  Patient has not received any help with oxycodone that was prescribed to her, therefore she has not taken it more recently.  She tried taking tramadol but it did not help.  She also states that she might be dehydrated.  Labs ordered.  We have started IV fluid hydration and and ordered morphine for pain control.  I will reassess.  At 530: Patient's pain improved with IV morphine.  She informed me that the pain had gone down to 1 out of 10.  We will try oral morphine now.  It is possible that she just needs more tailored pain management, which might prove a bit challenging given that she is in general hesitant on using opioids. We will reassess.  At 7:20: Pt feels better, but nervous about going home.  She is concerned that her pain was severe enough that it required her to go to the ER.  We will get a CT scan to ensure that there is no new  changes.  Dr. Alvino Chapel will follow.   Final Clinical Impression(s) / ED Diagnoses Final diagnoses:  None    Rx / DC Orders ED Discharge Orders    None       Varney Biles, MD 03/26/20 [phone]

## 2020-03-26 NOTE — ED Notes (Signed)
Benadryl 25mg IV given

## 2020-03-26 NOTE — Discharge Instructions (Signed)
DISCHARGE INSTRUCTIONS FOR KIDNEY STONE  MEDICATIONS:  1.  Resume all your other meds from home - except do not take any extra narcotic pain meds that you may have at home.   ACTIVITY:  1. No strenuous activity x 1week  2. No driving while on narcotic pain medications  3. Drink plenty of water  4. Continue to walk at home - you can still get blood clots when you are at home, so keep active, but don't over do it.  5. May return to work/school tomorrow or when you feel ready   BATHING:  1. You can shower and we recommend daily showers   SIGNS/SYMPTOMS TO CALL:  Please call us if you have a fever greater than 101.5, uncontrolled nausea/vomiting, uncontrolled pain, dizziness, unable to urinate, bloody urine, chest pain, shortness of breath, leg swelling, leg pain, redness around wound, drainage from wound, or any other concerns or questions.   You can reach [place] at [phone].   FOLLOW-UP:  1. You have an appointment in 6 weeks with a ultrasound of your kidneys prior.

## 2020-03-26 NOTE — ED Notes (Signed)
Dr. Neysa Bonito said to give Benadryl 25mg  IV x1 and keep her on the Ciprofloxacin since she did not have a reaction last time.

## 2020-03-26 NOTE — Progress Notes (Signed)
Pharmacy Antibiotic Note  Melinda [name] is a 59 y.o. female admitted on 03/26/2020 with UTI/.  Pharmacy has been consulted for vancomycin dosing.  Plan: Vancomycin 1000 mg IV x1, then vancomycin 750 mg IV q12h  Ciprofloxacin 400 mg IV q12h   Height: 5\' 6"  (167.6 cm) Weight: 66.4 kg (146 lb 4.8 oz) IBW/kg (Calculated) : 59.3  Temp (24hrs), Avg:97.6 F (36.4 C), Min:96.9 F (36.1 C), Max:98.4 F (36.9 C)  Recent Labs  Lab 03/20/20 0629 03/25/20 1436 03/26/20 0129 03/26/20 1024  WBC 2.3* 3.3* 4.4  --   CREATININE 0.75 0.84 0.77  --   LATICACIDVEN  --   --   --  1.2    Estimated Creatinine Clearance: 69.1 mL/min (by C-G formula based on SCr of 0.77 mg/dL).    Allergies  Allergen Reactions  . Cephalexin Anaphylaxis and Other (See Comments)    Kef tabs only (maybe dye) Capsules-anaphylxis  . Amoxapine And Related   . Amoxicillin-Pot Clavulanate Other (See Comments)  . Atorvastatin Other (See Comments)    intolernace  . Cefaclor Other (See Comments)  . Ciprofloxacin Nausea And Vomiting  . Codeine Hives  . Doxycycline Calcium Nausea And Vomiting  . Fenofibrate Micronized Other (See Comments)    Cramping  . Fish Oil Other (See Comments)  . Flagyl [Metronidazole Hcl]   . Levofloxacin In D5w Other (See Comments)    Unknown   . Metronidazole Nausea And Vomiting  . Ofloxacin Other (See Comments)  . Prednisolone Other (See Comments)    Anxiety, palpitations  . Promethazine Hcl Other (See Comments)  . Tape Other (See Comments)    Rash and blisters, only plastic tape  . Tetracycline Hcl Other (See Comments)  . Erythromycin Rash  . Nitrofurantoin Itching, Nausea And Vomiting and Rash    Antimicrobials this admission: 10/20 Vancomycin >>  10/20 ciprofloxacin >>   Dose adjustments this admission:    Microbiology results: 10/4 UCx: >100k MRSA, 10k E.coli   10/20 BCx:  10/19 UCx:   10/20 Respiratory panel:      Thank you for allowing pharmacy to be a part of  this patient's care.   Royetta Asal, PharmD, BCPS 03/26/2020 11:56 AM

## 2020-03-26 NOTE — ED Provider Notes (Addendum)
  Physical Exam  BP 93/64   Pulse 65   Temp 97.7 F (36.5 C) (Oral)   Resp 16   Ht 5\' 6"  (1.676 m)   Wt 66.4 kg   SpO2 94%   BMI 23.61 kg/m   Physical Exam  ED Course/Procedures     Procedures  MDM  Patient with abdominal and flank pain.  Received in signout.  Has had some chills at home.  Treated for UTI.  CT scan shows potentially infected obstructed stone.  And still shows some ileitis.  Urine culture from yesterday still pending.  However with potential infected stone will discuss with urology and admit to hospitalist.  Does not appear septic at this time.  Discussed with Dr. Diona Fanti.  Urology will see patient.  Discussed with pharmacist and they recommend Vanco and Cipro.  Have reviewed allergies and cultures.  Will admit to hospitalist.       Davonna Belling, MD 03/26/20 6256    Davonna Belling, MD 03/26/20 1013

## 2020-03-26 NOTE — ED Notes (Signed)
Messaged Dr. Neysa Bonito about patients allergy to Ciprofloxacin. I asked patient about this allergy and what it causes for a reaction and patient said she does not remember being allergic to it. She stated she had Ciprofloxacin antibiotic the last time she was here and did not have a reaction. Patient is on Vancomycin and Ciprofloxacin IV antibiotics. Patient is now complaining of itching on her left arm where the IV was infusing.

## 2020-03-26 NOTE — Op Note (Signed)
Preoperative diagnosis: right ureteral calculus  Postoperative diagnosis: right ureteral calculus  Procedure:  1. Cystoscopy 2. right ureteroscopy and stone removal 3. Ureteroscopic laser lithotripsy 4. right retrograde pyelography with interpretation  Surgeon: [name], MD  Anesthesia: General  Complications: None  Intraoperative findings: right retrograde pyelography demonstrated a filling defect within the right distal ureter consistent with the patient's known calculus, as well as severe proximal hydroureteronephrosis.  There were no other abnormalities.  At the end of the case I repeated the retrograde pyelogram and there was brisk drainage of the contrast.  Given the failure atraumatic stone extraction I opted not to leave a stent.  EBL: Minimal  Specimens: 1. right ureteral calculus  Disposition of specimens: [facility] for stone analysis  Indication: Melinda [name] is a 59 y.o.   patient with a right ureteral stone and associated right symptoms. After reviewing the management options for treatment, the patient elected to proceed with the above surgical procedure(s). We have discussed the potential benefits and risks of the procedure, side effects of the proposed treatment, the likelihood of the patient achieving the goals of the procedure, and any potential problems that might occur during the procedure or recuperation. Informed consent has been obtained.   Description of procedure:  The patient was taken to the operating room and general anesthesia was induced.  The patient was placed in the dorsal lithotomy position, prepped and draped in the usual sterile fashion, and preoperative antibiotics were administered. A preoperative time-out was performed.   Cystourethroscopy was performed.  The patient's urethra was examined and was normal.  The bladder was very full, notably large capacity.  The bladder was then systematically examined in its  entirety. There was no evidence for any bladder tumors, stones, or other mucosal pathology.    Attention then turned to the right ureteral orifice and a ureteral catheter was used to intubate the ureteral orifice.  Omnipaque contrast was injected through the ureteral catheter and a retrograde pyelogram was performed with findings as dictated above.  A 0.38 sensor guidewire was then advanced up the right ureter into the renal pelvis under fluoroscopic guidance. The 6 Fr semirigid ureteroscope was then advanced into the ureter next to the guidewire and the calculus was identified.   The stone was then fragmented with the 365 micron holmium laser fiber on a setting of 0.6 and frequency of 6 Hz.   All stones were then removed from the ureter with an N-gage nitinol basket.  Reinspection of the ureter revealed no remaining visible stones or fragments.   I then instilled another 10 cc of Omnipaque contrast into the patient's right collecting system and remove the wire and the catheter.  There was brisk drainage of contrast at this time.  I opted not to place a stent at this time.  The bladder was then emptied and the procedure ended.  The patient appeared to tolerate the procedure well and without complications.  The patient was able to be awakened and transferred to the recovery unit in satisfactory condition.   Disposition:  The patient has been scheduled for followup in 6 weeks with a renal ultrasound.

## 2020-03-27 ENCOUNTER — Encounter (HOSPITAL_COMMUNITY): Payer: Self-pay | Admitting: Urology

## 2020-03-27 DIAGNOSIS — N1330 Unspecified hydronephrosis: Secondary | ICD-10-CM

## 2020-03-27 DIAGNOSIS — R1031 Right lower quadrant pain: Secondary | ICD-10-CM | POA: Diagnosis not present

## 2020-03-27 LAB — CBC
HCT: 28.8 % — ABNORMAL LOW (ref 36.0–46.0)
Hemoglobin: 9 g/dL — ABNORMAL LOW (ref 12.0–15.0)
MCH: 27.3 pg (ref 26.0–34.0)
MCHC: 31.3 g/dL (ref 30.0–36.0)
MCV: 87.3 fL (ref 80.0–100.0)
Platelets: 131 10*3/uL — ABNORMAL LOW (ref 150–400)
RBC: 3.3 MIL/uL — ABNORMAL LOW (ref 3.87–5.11)
RDW: 16.4 % — ABNORMAL HIGH (ref 11.5–15.5)
WBC: 1.6 10*3/uL — ABNORMAL LOW (ref 4.0–10.5)
nRBC: 0 % (ref 0.0–0.2)

## 2020-03-27 LAB — DIFFERENTIAL
Abs Immature Granulocytes: 0.01 10*3/uL (ref 0.00–0.07)
Basophils Absolute: 0 10*3/uL (ref 0.0–0.1)
Basophils Relative: 1 %
Eosinophils Absolute: 0 10*3/uL (ref 0.0–0.5)
Eosinophils Relative: 1 %
Immature Granulocytes: 1 %
Lymphocytes Relative: 20 %
Lymphs Abs: 0.3 10*3/uL — ABNORMAL LOW (ref 0.7–4.0)
Monocytes Absolute: 0.3 10*3/uL (ref 0.1–1.0)
Monocytes Relative: 19 %
Neutro Abs: 0.9 10*3/uL — ABNORMAL LOW (ref 1.7–7.7)
Neutrophils Relative %: 58 %

## 2020-03-27 LAB — BASIC METABOLIC PANEL
Anion gap: 9 (ref 5–15)
BUN: 9 mg/dL (ref 8–23)
CO2: 26 mmol/L (ref 22–32)
Calcium: 8.6 mg/dL — ABNORMAL LOW (ref 8.9–10.3)
Chloride: 103 mmol/L (ref 98–111)
Creatinine, Ser: 0.91 mg/dL (ref 0.44–1.00)
GFR, Estimated: >60 mL/min (ref >60)
Glucose, Bld: 86 mg/dL (ref 70–99)
Potassium: 3.6 mmol/L (ref 3.5–5.1)
Sodium: 138 mmol/L (ref 135–145)

## 2020-03-27 MED ORDER — ROSUVASTATIN CALCIUM 5 MG PO TABS
5.0000 mg | ORAL_TABLET | Freq: Every evening | ORAL | Status: DC
  Administered 2020-03-27 – 2020-03-31 (×5): 5 mg via ORAL
  Filled 2020-03-27 (×5): qty 1

## 2020-03-27 MED ORDER — ENSURE ENLIVE PO LIQD
237.0000 mL | ORAL | Status: DC
  Administered 2020-03-27 – 2020-03-29 (×3): 237 mL via ORAL

## 2020-03-27 MED ORDER — RESOURCE INSTANT PROTEIN PO PWD PACKET
1.0000 | Freq: Three times a day (TID) | ORAL | Status: DC
  Filled 2020-03-27 (×14): qty 6

## 2020-03-27 MED ORDER — PANTOPRAZOLE SODIUM 40 MG PO TBEC
40.0000 mg | DELAYED_RELEASE_TABLET | Freq: Every day | ORAL | Status: DC
  Administered 2020-03-27 – 2020-03-31 (×5): 40 mg via ORAL
  Filled 2020-03-27 (×7): qty 1

## 2020-03-27 MED ORDER — METRONIDAZOLE IN NACL 5-0.79 MG/ML-% IV SOLN
500.0000 mg | Freq: Three times a day (TID) | INTRAVENOUS | Status: DC
  Filled 2020-03-27: qty 100

## 2020-03-27 MED ORDER — FLUOXETINE HCL 20 MG PO CAPS
20.0000 mg | ORAL_CAPSULE | Freq: Every morning | ORAL | Status: DC
  Administered 2020-03-27 – 2020-03-31 (×5): 20 mg via ORAL
  Filled 2020-03-27 (×6): qty 1

## 2020-03-27 MED ORDER — PHENAZOPYRIDINE HCL 100 MG PO TABS
100.0000 mg | ORAL_TABLET | Freq: Three times a day (TID) | ORAL | Status: DC
  Administered 2020-03-27 – 2020-03-30 (×10): 100 mg via ORAL
  Filled 2020-03-27 (×11): qty 1

## 2020-03-27 MED ORDER — CLORAZEPATE DIPOTASSIUM 3.75 MG PO TABS
7.5000 mg | ORAL_TABLET | Freq: Two times a day (BID) | ORAL | Status: DC
  Administered 2020-03-27 – 2020-03-31 (×9): 7.5 mg via ORAL
  Filled 2020-03-27 (×10): qty 2

## 2020-03-27 MED ORDER — ACETAMINOPHEN 325 MG PO TABS
650.0000 mg | ORAL_TABLET | Freq: Four times a day (QID) | ORAL | Status: DC | PRN
  Administered 2020-03-27: 650 mg via ORAL
  Filled 2020-03-27: qty 2

## 2020-03-27 MED ORDER — BENEPROTEIN PO POWD
1.0000 | Freq: Three times a day (TID) | ORAL | Status: DC
  Filled 2020-03-27: qty 227

## 2020-03-27 MED ORDER — SODIUM CHLORIDE 0.9 % IV SOLN: INTRAVENOUS | Status: DC

## 2020-03-27 NOTE — Progress Notes (Signed)
I have reviewed and agree with students assessment

## 2020-03-27 NOTE — Progress Notes (Signed)
Initial Nutrition Assessment  INTERVENTION:   -Ensure Enlive po daily, each supplement provides 350 kcal and 20 grams of protein -Beneprotein powder with meals, provides 25 kcals and 6g protein  NUTRITION DIAGNOSIS:   Increased nutrient needs related to cancer and cancer related treatments as evidenced by estimated needs.  GOAL:   Patient will meet greater than or equal to 90% of their needs  MONITOR:   PO intake, Supplement acceptance, Labs, Weight trends, I & O's  REASON FOR ASSESSMENT:   Malnutrition Screening Tool    ASSESSMENT:   59 year old female with past medical history of recurrent anal squamous cell carcinoma (most recently treated with chemoradiation with mitomycin and 5-FU, last treatment 03/07/2020), type 2 diabetes, hyperlipidemia who was recently admitted from 10/7-10/14 for radiation colitis and E. coli and MRSA UTI (discharged on Bactrim) who presented to the ED with complaints of worsening lower abdominal pain since her discharge.   10/20: s/p Cystoscopy, right ureteroscopy and stone removal, Ureteroscopic laser lithotripsy  Patient just recently discharged on 10/14. During that admission, pt was receiving Ensure supplements and Beneprotein supplements. Pt was encouraged to increase protein foods and supplements given increased needs from cancer treatments.   Pt now admitted with abdominal pain which has improved today. States scared to eat solid foods as it will cause diarrhea. Mainly consuming liquids at this time. Will resume protein supplements added from last admission. Pt is not a fan of Boost Breeze but will take Ensure.  Per weight records, pt has lost 15 lbs since 9/7 (9% wt loss x 1.5 months, significant for time frame).   Labs reviewed. Medications: IV Zofran  NUTRITION - FOCUSED PHYSICAL EXAM:    Most Recent Value  Orbital Region No depletion  Upper Arm Region No depletion  Thoracic and Lumbar Region Unable to assess  Buccal Region No  depletion  Temple Region No depletion  Clavicle Bone Region No depletion  Clavicle and Acromion Bone Region No depletion  Scapular Bone Region No depletion  Dorsal Hand No depletion  Patellar Region Mild depletion  Anterior Thigh Region No depletion  Posterior Calf Region Mild depletion  Edema (RD Assessment) None       Diet Order:   Diet Order            Diet regular Room service appropriate? Yes; Fluid consistency: Thin  Diet effective now                 EDUCATION NEEDS:   No education needs have been identified at this time  Skin:  Skin Assessment: Reviewed RN Assessment  Last BM:  10/19  Height:   Ht Readings from Last 1 Encounters:  03/26/20 5\' 6"  (1.676 m)    Weight:   Wt Readings from Last 1 Encounters:  03/26/20 66.4 kg    BMI:  Body mass index is 23.61 kg/m.  Estimated Nutritional Needs:   Kcal:  2100-2300  Protein:  100-115g  Fluid:  2.1L/day   Clayton Bibles, MS, RD, LDN Inpatient Clinical Dietitian Contact information available via Amion

## 2020-03-27 NOTE — Progress Notes (Signed)
PROGRESS NOTE    Melinda [name]  YDX:[id number] DOB: 07/05/1960 DOA: 03/26/2020 PCP: Crist Infante, MD    Brief Narrative-59 year old female with past medical history of recurrent anal squamous cell carcinoma (most recently treated with chemoradiation with mitomycin and 5-FU, last treatment 03/07/2020), type 2 diabetes, hyperlipidemia who was recently admitted from 10/7-10/14 for radiation colitis and E. coli and MRSA UTI (discharged on Bactrim) who presented to the ED with complaints of worsening lower abdominal pain since her discharge.  She was supposed to see her oncologist today but could not manage the pain at home with tramadol.  Pain is sharp 10/10, nonradiating and has associated reduced p.o. intake.  Also with 3 loose bowel movements today.  ED Course: Afebrile and hemodynamically stable on room air with an episode of hypotension.  Labs most notable for UTI with normal renal function. She received morphine with some improvement in her pain but not resolved.  ED physician ordered a CT scan. CT abdomen pelvis with contrast: new mild right hydroureteronephrosis secondary to 3 mm distal right ureteral obstructing calculus which was present but moved since her prior study with residual or recurrent infectious/inflammatory distal ileum inflammatory changes.  EDP consulted Dr. Diona Fanti, and urology, and pharmacy who recommended Vanco and Cipro.  Assessment & Plan:   Principal Problem:   Abdominal pain Active Problems:   Dyslipidemia   Squamous cell cancer, anus (HCC)   Ileitis   Hydroureteronephrosis   Acute lower UTI   #1 obstructing right ureteral calculus with hydroureteronephrosis status post right ureteroscopy and stone removal, ureteroscopic laser lithotripsy and cystoscopy, status post right distal ureteral stent removal.  Stent not left in place as kidney noted to be draining well.  Will DC Foley today. Renal function stable at BUN 9 and creatinine 0.91.  #2 ileitis -CT abdomen  shows recurrent infectious/inflammatory changes of the distal ileum decreased cecal wall thickening.  Add Flagyl to Cipro.  #3 recurrent anal cancer status post chemoradiation  #4 hyperlipidemia on statin  #5 recent E. coli and MRSA UTI urine culture 03/10/2020 with E. coli and MRSA.  She was sent home on Bactrim.  Urine culture this admission shows insignificant growth. ?  Will check with urology if we can DC vancomycin.   Estimated body mass index is 23.61 kg/m as calculated from the following:   Height as of this encounter: 5\' 6"  (1.676 m).   Weight as of this encounter: 66.4 kg.  DVT prophylaxis: Lovenox  code Status: Full code Family Communication: None at bedside  disposition Plan:  Status is: Inpatient   Dispo: The patient is from: Home              Anticipated d/c is to: Home              Anticipated d/c date is: 2 days              Patient currently is not medically stable to d/c.    Consultants:   Urology  Procedures: Cystoscopy 03/26/2020 Antimicrobials: Vanco and Cipro and Flagyl Subjective: Patient resting in bed feels better than yesterday anxious to have the catheter out  Objective: Vitals:   03/26/20 1831 03/26/20 1936 03/27/20 0143 03/27/20 0604  BP: 112/67 100/71 (!) 98/59 121/77  Pulse: 70 88 74 83  Resp: 16 16 16 16   Temp:  (!) 97.4 F (36.3 C) 97.8 F (36.6 C) 98.8 F (37.1 C)  TempSrc:  Oral Oral Oral  SpO2: 100% 100% 100% 99%  Weight:  Height:        Intake/Output Summary (Last 24 hours) at 03/27/2020 1002 Last data filed at 03/27/2020 0923 Gross per 24 hour  Intake 2225.51 ml  Output 1700 ml  Net 525.51 ml   Filed Weights   03/26/20 0127  Weight: 66.4 kg    Examination:  General exam: Appears calm and comfortable  Respiratory system: Clear to auscultation. Respiratory effort normal. Cardiovascular system: S1 & S2 heard, RRR. No JVD, murmurs, rubs, gallops or clicks. No pedal edema. Gastrointestinal system: Abdomen is  nondistended, soft and nontender. No organomegaly or masses felt. Normal bowel sounds heard. Central nervous system: Alert and oriented. No focal neurological deficits. Extremities: Symmetric 5 x 5 power. Skin: No rashes, lesions or ulcers Psychiatry: Judgement and insight appear normal. Mood & affect appropriate.     Data Reviewed: I have personally reviewed following labs and imaging studies  CBC: Recent Labs  Lab 03/25/20 1436 03/26/20 0129 03/27/20 0431  WBC 3.3* 4.4 1.6*  NEUTROABS 2.3  --   --   HGB 11.8* 11.8* 9.0*  HCT 37.1 36.4 28.8*  MCV 84.7 83.7 87.3  PLT 201 226 263*   Basic Metabolic Panel: Recent Labs  Lab 03/25/20 1436 03/26/20 0129 03/27/20 0431  NA 136 134* 138  K 4.3 3.9 3.6  CL 103 103 103  CO2 28 21* 26  GLUCOSE 119* 143* 86  BUN 15 18 9   CREATININE 0.84 0.77 0.91  CALCIUM 9.7 9.1 8.6*   GFR: Estimated Creatinine Clearance: 60.8 mL/min (by C-G formula based on SCr of 0.91 mg/dL). Liver Function Tests: Recent Labs  Lab 03/25/20 1436 03/26/20 0129  AST 15 17  ALT 11 13  ALKPHOS 67 59  BILITOT 0.4 0.6  PROT 6.9 7.2  ALBUMIN 3.0* 3.6   Recent Labs  Lab 03/26/20 0129  LIPASE 44   No results for input(s): AMMONIA in the last 168 hours. Coagulation Profile: No results for input(s): INR, PROTIME in the last 168 hours. Cardiac Enzymes: No results for input(s): CKTOTAL, CKMB, CKMBINDEX, TROPONINI in the last 168 hours. BNP (last 3 results) No results for input(s): PROBNP in the last 8760 hours. HbA1C: No results for input(s): HGBA1C in the last 72 hours. CBG: Recent Labs  Lab 03/20/20 1225  GLUCAP 124*   Lipid Profile: No results for input(s): CHOL, HDL, LDLCALC, TRIG, CHOLHDL, LDLDIRECT in the last 72 hours. Thyroid Function Tests: No results for input(s): TSH, T4TOTAL, FREET4, T3FREE, THYROIDAB in the last 72 hours. Anemia Panel: No results for input(s): VITAMINB12, FOLATE, FERRITIN, TIBC, IRON, RETICCTPCT in the last 72  hours. Sepsis Labs: Recent Labs  Lab 03/26/20 1024  LATICACIDVEN 1.2    Recent Results (from the past 240 hour(s))  Urine Culture     Status: Abnormal   Collection Time: 03/25/20  2:36 PM   Specimen: Urine, Clean Catch  Result Value Ref Range Status   Specimen Description   Final    URINE, CLEAN CATCH Performed at [facility], [address], [place], [place] [address]    Special Requests   Final    NONE Performed at [facility], [place] [address] [place], [place] [address]    Culture (A)  Final    <10,000 COLONIES/mL INSIGNIFICANT GROWTH Performed at [place] [address], [place], [place] [address]    Report Status 03/26/2020 FINAL  Final  Respiratory Panel by RT PCR (Flu A&B, Covid) - Nasopharyngeal Swab  Status: None   Collection Time: 03/26/20 10:24 AM   Specimen: Nasopharyngeal Swab  Result Value Ref Range Status   SARS Coronavirus 2 by RT PCR NEGATIVE NEGATIVE Final    Comment: (NOTE) SARS-CoV-2 target nucleic acids are NOT DETECTED.  The SARS-CoV-2 RNA is generally detectable in upper respiratoy specimens during the acute phase of infection. The lowest concentration of SARS-CoV-2 viral copies this assay can detect is 131 copies/mL. A negative result does not preclude SARS-Cov-2 infection and should not be used as the sole basis for treatment or other patient management decisions. A negative result may occur with  improper specimen collection/handling, submission of specimen other than nasopharyngeal swab, presence of viral mutation(s) within the areas targeted by this assay, and inadequate number of viral copies (<131 copies/mL). A negative result must be combined with clinical observations, patient history, and epidemiological information. The expected result is Negative.  Fact Sheet for Patients:  PinkCheek.be  Fact Sheet for Healthcare Providers:   GravelBags.it  This test is no t yet approved or cleared by the [place] FDA and  has been authorized for detection and/or diagnosis of SARS-CoV-2 by FDA under an Emergency Use Authorization (EUA). This EUA will remain  in effect (meaning this test can be used) for the duration of the COVID-19 declaration under Section 564(b)(1) of the Act, 21 U.S.C. section 360bbb-3(b)(1), unless the authorization is terminated or revoked sooner.     Influenza A by PCR NEGATIVE NEGATIVE Final   Influenza B by PCR NEGATIVE NEGATIVE Final    Comment: (NOTE) The Xpert Xpress SARS-CoV-2/FLU/RSV assay is intended as an aid in  the diagnosis of influenza from Nasopharyngeal swab specimens and  should not be used as a sole basis for treatment. Nasal washings and  aspirates are unacceptable for Xpert Xpress SARS-CoV-2/FLU/RSV  testing.  Fact Sheet for Patients: PinkCheek.be  Fact Sheet for Healthcare Providers: GravelBags.it  This test is not yet approved or cleared by the [place] FDA and  has been authorized for detection and/or diagnosis of SARS-CoV-2 by  FDA under an Emergency Use Authorization (EUA). This EUA will remain  in effect (meaning this test can be used) for the duration of the  Covid-19 declaration under Section 564(b)(1) of the Act, 21  U.S.C. section 360bbb-3(b)(1), unless the authorization is  terminated or revoked. Performed at [facility], [place] [address], [place], [place] [address]          Radiology Studies: CT ABDOMEN PELVIS W CONTRAST  Result Date: 03/26/2020 CLINICAL DATA:  Increasing abdominal pain since recent discharge EXAM: CT ABDOMEN AND PELVIS WITH CONTRAST TECHNIQUE: Multidetector CT imaging of the abdomen and pelvis was performed using the standard protocol following bolus administration of intravenous contrast. CONTRAST:  121mL OMNIPAQUE IOHEXOL  300 MG/ML  SOLN COMPARISON:  03/13/2020 FINDINGS: Lower chest: No acute abnormality. Hepatobiliary: Probable hepatic steatosis. No focal liver lesion. Gallbladder is unremarkable. No biliary dilatation. Pancreas: Unremarkable. Spleen: Unremarkable. Adrenals/Urinary Tract: Adrenals unremarkable. Punctate nonobstructing calculus of the upper pole the right kidney. There is right upper pole cortical scarring. Mild right hydroureteronephrosis. This is secondary to an obstructing 3 mm calculus of the distal ureter, which was present on the prior study and has moved slightly inferiorly. Bladder is unremarkable. Stomach/Bowel: Stomach is within normal limits. There is persistent wall thickening involving the terminal ileum and adjacent distal ileum. Decreased cecal wall thickening. No bowel obstruction. Vascular/Lymphatic: Aortic atherosclerosis. No enlarged lymph nodes identified. Reproductive: Status post hysterectomy. No adnexal masses. Other: Small  volume abdominopelvic free fluid. Abdominal wall is unremarkable. Musculoskeletal: No acute osseous abnormality. IMPRESSION: New mild right hydroureteronephrosis. Secondary to an obstructing 3 mm distal right ureteral calculus. This was present on the prior study and has moved slightly inferiorly/distally. Residual or recurrent infectious/inflammatory changes of the distal ileum. Decreased cecal wall thickening. Electronically Signed   By: Macy Mis M.D.   On: 03/26/2020 09:39   DG C-Arm 1-60 Min-No Report  Result Date: 03/26/2020 Fluoroscopy was utilized by the requesting physician.  No radiographic interpretation.        Scheduled Meds: . Chlorhexidine Gluconate Cloth  6 each Topical Daily  . enoxaparin (LOVENOX) injection  40 mg Subcutaneous Q24H  . feeding supplement  237 mL Oral Q24H  . protein supplement  1 Scoop Oral TID WC   Continuous Infusions: . ciprofloxacin 400 mg (03/26/20 2259)  . vancomycin 750 mg (03/26/20 2258)     LOS: 1 day      Georgette Shell, MD  03/27/2020, 10:02 AM

## 2020-03-27 NOTE — Progress Notes (Addendum)
HEMATOLOGY-ONCOLOGY PROGRESS NOTE  SUBJECTIVE: The patient was admitted with worsening abdominal pain.  CT of the abdomen pelvis with contrast in the ER showed new mild right hydroureteronephrosis secondary to 3 mm distal right ureteral obstructing calculus.  Urology was consulted and the patient underwent cystoscopy with stone removal and lithotripsy last evening.  She has also been started on IV antibiotics consisting of vancomycin and Cipro.  Reports abdominal discomfort is much better today.  She still has some mild discomfort however.  She has not had any nausea or vomiting.  Taking in only liquids this morning.  She states that she is afraid to eat regular food because it will trigger her diarrhea and shock to go to the bathroom.  She currently has a Foley catheter in place and does not want to carry her Foley catheter with her.  She is hoping that her Foley will be discontinued today.  No hematuria noted.  Oncology History Overview Note  Cancer Staging No matching staging information was found for the patient.    Squamous cell cancer, anus ([place])  07/13/2019 Procedure    EUA, Excision of posterior internal/external hemorrhoid under the care of Dr. Hassell Done    07/13/2019 Pathology Results   - Invasive moderately differentiated squamous cell carcinoma, 1.4 cm.  See comment  - Carcinoma invades for depth of 0.4 cm  - Deep resection margin is negative for carcinoma (0.2 cm)  - Lateral mucosal margin is positive for high-grade dysplasia  - No evidence of lymphovascular perineural invasion  Procedure: Local excision  Tumor Site: Anal canal  Tumor Size: 1.4 cm  Histologic Type: Invasive squamous cell carcinoma  Histologic Grade: G2: Moderately differentiated  Tumor Extension: Carcinoma invades superficial anal sphincter muscle  Margins: Uninvolved by tumor  Treatment Effect: N/A  Regional Lymph Nodes: No lymph nodes submitted or found  Pathologic Stage Classification (pTNM, AJCC 8th Edition):   pT1, pNx  Representative Tumor Block: A1  Comment(s): Lateral mucosal margin is positive for high-grade squamous  dysplasia      07/13/2019 Initial Diagnosis   Squamous cell cancer, anus ([place])   09/04/2019 Imaging   CT scan Chest, Abdomen, and Pelvis   IMPRESSION: 1. No evidence of metastatic disease in the chest, abdomen or pelvis. 2. No discrete anorectal mass. 3. Chronic findings include: Punctate nonobstructing upper right renal stones and chronic right renal scarring. 4. Aortic Atherosclerosis (ICD10-I70.0).   01/10/2020 Pathology Results   A. ANAL LESION, POSTERIOR MIDLINE, EXCISION:  - Squamous cell carcinoma, moderately differentiated.  Verbally reported by Dr. Saralyn Pilar 1.5 cm, negative margins, depth <1 mm   01/11/2020 Procedure   1. Excision of anal canal lesion (posterior midline) under the care of Dr. Dema Severin     01/28/2020 PET scan   IMPRESSION: 1. Mild focal anal hypermetabolism without discrete mass correlate on the CT images, nonspecific, differential includes postsurgical change or residual anal tumor. 2. No hypermetabolic locoregional or distant metastatic disease. 3. Chronic findings include: Aortic Atherosclerosis (ICD10-I70.0). Diffuse hepatic steatosis. Nonobstructing right nephrolithiasis.     02/04/2020 - 03/07/2020 Radiation Therapy   concurrent chemoRT by Dr Lisbeth Renshaw with Mitomycin and 5FU starting 02/04/20-03/07/20. The last 4 session cancelled due to poor toleration.    02/04/2020 - 03/03/2020 Chemotherapy   Concurrent chemoRT with Mitomycin and 5FU on week 1 and 5 starting 02/04/20-03/03/20   03/13/2020 Imaging   CT AP W contrast  IMPRESSION: Diffuse wall thickening with inflammatory changes involving a long segment of distal ileum, cecum, and sigmoid colon likely represents  enteritis/colitis. This could be due to infectious, or inflammatory nature, not thought to represent ischemic colitis.   Hepatic steatosis   Trace pericardial effusion   Small  free fluid in the deep pelvis      REVIEW OF SYSTEMS:   Constitutional: No fevers or chills documented Eyes: Denies blurriness of vision Ears, nose, mouth, throat, and face: Denies mucositis or sore throat Respiratory: Denies cough, dyspnea or wheezes Cardiovascular: Denies palpitation, chest discomfort Gastrointestinal: Abdominal pain better this morning, denies nausea vomiting, states that she had diarrhea yesterday.  Has not eaten yet today. Skin: Denies abnormal skin rashes Lymphatics: Denies new lymphadenopathy or easy bruising Neurological:Denies numbness, tingling or new weaknesses Behavioral/Psych: Mood is stable, no new changes  Extremities: No lower extremity edema All other systems were reviewed with the patient and are negative.  I have reviewed the past medical history, past surgical history, social history and family history with the patient and they are unchanged from previous note.   PHYSICAL EXAMINATION: ECOG PERFORMANCE STATUS: 3 - Symptomatic, >50% confined to bed  Vitals:   03/27/20 0143 03/27/20 0604  BP: (!) 98/59 121/77  Pulse: 74 83  Resp: 16 16  Temp: 97.8 F (36.6 C) 98.8 F (37.1 C)  SpO2: 100% 99%   Filed Weights   03/26/20 0127  Weight: 66.4 kg    Intake/Output from previous day: 10/20 0701 - 10/21 0700 In: 2105.5 [P.O.:360; I.V.:1350; IV Piggyback:395.5] Out: 1350 [Urine:1350]  GENERAL:alert, no distress and comfortable SKIN: skin color, texture, turgor are normal, no rashes or significant lesions LUNGS: clear to auscultation and percussion with normal breathing effort HEART: regular rate & rhythm and no murmurs and no lower extremity edema ABDOMEN:abdomen soft, non-tender and normal bowel sounds NEURO: alert & oriented x 3 with fluent speech, no focal motor/sensory deficits  LABORATORY DATA:  I have reviewed the data as listed CMP Latest Ref Rng & Units 03/27/2020 03/26/2020 03/25/2020  Glucose 70 - 99 mg/dL 86 143(H) 119(H)  BUN  8 - 23 mg/dL 9 18 15   Creatinine 0.44 - 1.00 mg/dL 0.91 0.77 0.84  Sodium 135 - 145 mmol/L 138 134(L) 136  Potassium 3.5 - 5.1 mmol/L 3.6 3.9 4.3  Chloride 98 - 111 mmol/L 103 103 103  CO2 22 - 32 mmol/L 26 21(L) 28  Calcium 8.9 - 10.3 mg/dL 8.6(L) 9.1 9.7  Total Protein 6.5 - 8.1 g/dL - 7.2 6.9  Total Bilirubin 0.3 - 1.2 mg/dL - 0.6 0.4  Alkaline Phos 38 - 126 U/L - 59 67  AST 15 - 41 U/L - 17 15  ALT 0 - 44 U/L - 13 11    Lab Results  Component Value Date   WBC 1.6 (L) 03/27/2020   HGB 9.0 (L) 03/27/2020   HCT 28.8 (L) 03/27/2020   MCV 87.3 03/27/2020   PLT 131 (L) 03/27/2020   NEUTROABS 2.3 03/25/2020    CT ABDOMEN PELVIS W CONTRAST  Result Date: 03/26/2020 CLINICAL DATA:  Increasing abdominal pain since recent discharge EXAM: CT ABDOMEN AND PELVIS WITH CONTRAST TECHNIQUE: Multidetector CT imaging of the abdomen and pelvis was performed using the standard protocol following bolus administration of intravenous contrast. CONTRAST:  137mL OMNIPAQUE IOHEXOL 300 MG/ML  SOLN COMPARISON:  03/13/2020 FINDINGS: Lower chest: No acute abnormality. Hepatobiliary: Probable hepatic steatosis. No focal liver lesion. Gallbladder is unremarkable. No biliary dilatation. Pancreas: Unremarkable. Spleen: Unremarkable. Adrenals/Urinary Tract: Adrenals unremarkable. Punctate nonobstructing calculus of the upper pole the right kidney. There is right upper pole cortical scarring.  Mild right hydroureteronephrosis. This is secondary to an obstructing 3 mm calculus of the distal ureter, which was present on the prior study and has moved slightly inferiorly. Bladder is unremarkable. Stomach/Bowel: Stomach is within normal limits. There is persistent wall thickening involving the terminal ileum and adjacent distal ileum. Decreased cecal wall thickening. No bowel obstruction. Vascular/Lymphatic: Aortic atherosclerosis. No enlarged lymph nodes identified. Reproductive: Status post hysterectomy. No adnexal masses.  Other: Small volume abdominopelvic free fluid. Abdominal wall is unremarkable. Musculoskeletal: No acute osseous abnormality. IMPRESSION: New mild right hydroureteronephrosis. Secondary to an obstructing 3 mm distal right ureteral calculus. This was present on the prior study and has moved slightly inferiorly/distally. Residual or recurrent infectious/inflammatory changes of the distal ileum. Decreased cecal wall thickening. Electronically Signed   By: Macy Mis M.D.   On: 03/26/2020 09:39   CT ABDOMEN PELVIS W CONTRAST  Result Date: 03/13/2020 CLINICAL DATA:  Abdominal pain and fever, history of squamous cell EXAM: CT ABDOMEN AND PELVIS WITH CONTRAST TECHNIQUE: Multidetector CT imaging of the abdomen and pelvis was performed using the standard protocol following bolus administration of intravenous contrast. CONTRAST:  14mL OMNIPAQUE IOHEXOL 300 MG/ML  SOLN COMPARISON:  September 04, 2019 FINDINGS: Lower chest: The visualized heart size within normal limits. There is a trace pericardial effusion. No hiatal hernia. The visualized portions of the lungs are clear. Hepatobiliary: There is diffuse low density seen throughout the liver parenchyma. No focal hepatic lesion is seen. No intra or extrahepatic biliary ductal dilatation. The main portal vein is patent. No evidence of calcified gallstones, gallbladder wall thickening or biliary dilatation. Pancreas: Unremarkable. No pancreatic ductal dilatation or surrounding inflammatory changes. Spleen: Normal in size without focal abnormality. Adrenals/Urinary Tract: Both adrenal glands appear normal. There is a punctate calcifications seen in the upper pole of the right kidney. There is areas of scarring seen within the upper pole. A 1 cm low-density lesion seen in the lower pole the right kidney. No left-sided renal or collecting system calculi. There is mild diffuse bladder wall thickening. Stomach/Bowel: The stomach and proximal small bowel are unremarkable. There is  a long segment of ileum with diffuse wall thickening surrounding fat stranding changes to the terminal ileum. There is also wall thickening seen around the distal cecal pole. Mild wall thickening with surrounding fat stranding changes are seen around the sigmoid colon extending to the sigmoid rectal junction. No loculated fluid collections or free air however are noted. Vascular/Lymphatic: There are no enlarged mesenteric, retroperitoneal, or pelvic lymph nodes. Scattered aortic atherosclerotic calcifications are seen without aneurysmal dilatation. Reproductive: The patient is status post hysterectomy. No adnexal masses or collections seen. Other: A small amount of free fluid is seen within the deep cul-de-sac. Musculoskeletal: No acute or significant osseous findings. IMPRESSION: Diffuse wall thickening with inflammatory changes involving a long segment of distal ileum, cecum, and sigmoid colon likely represents enteritis/colitis. This could be due to infectious, or inflammatory nature, not thought to represent ischemic colitis. Hepatic steatosis Trace pericardial effusion Small free fluid in the deep pelvis Electronically Signed   By: Prudencio Pair M.D.   On: 03/13/2020 21:30   DG C-Arm 1-60 Min-No Report  Result Date: 03/26/2020 Fluoroscopy was utilized by the requesting physician.  No radiographic interpretation.   IR PICC PLACEMENT RIGHT <5 YRS INC IMG GUIDE  Result Date: 03/03/2020 INDICATION: Patient with history of squamous cell carcinoma of the anus; central venous access requested for chemotherapy. EXAM: RIGHT UPPER EXTREMITY PICC LINE PLACEMENT WITH ULTRASOUND AND FLUOROSCOPIC GUIDANCE  MEDICATIONS: 1% lidocaine to skin and subcutaneous tissue ANESTHESIA/SEDATION: None FLUOROSCOPY TIME:  Fluoroscopy Time: 54  seconds (6 mGy). COMPLICATIONS: None immediate. PROCEDURE: The patient was advised of the possible risks and complications and agreed to undergo the procedure. The patient was then brought to  the angiographic suite for the procedure. The right arm was prepped with chlorhexidine, draped in the usual sterile fashion using maximum barrier technique (cap and mask, sterile gown, sterile gloves, large sterile sheet, hand hygiene and cutaneous antisepsis) and infiltrated locally with 1% Lidocaine. Ultrasound demonstrated patency of the right basilic vein, and this was documented with an image. Under real-time ultrasound guidance, this vein was accessed with a 21 gauge micropuncture needle and image documentation was performed. A 0.018 wire was introduced in to the vein. Over this, a 5 [place] single lumen power-injectable PICC was advanced to the lower SVC/right atrial junction. Fluoroscopy during the procedure and fluoro spot radiograph confirms appropriate catheter position. The catheter was flushed and covered with a sterile dressing. Catheter length: 40 cm IMPRESSION: Successful right arm Power PICC line placement with ultrasound and fluoroscopic guidance. The catheter is ready for use. Read by: Rowe Robert, PA-C Electronically Signed   By: Corrie Mckusick D.O.   On: 03/03/2020 11:20    ASSESSMENT AND PLAN: 1.  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  Obstructing right ureteral calculus with hydroureteronephrosis 3.  Ileitis 4.  Recent E. coli and MRSA UTI 5.  Leukopenia 6.  Anemia 7.  Mild thrombocytopenia  -The patient has completed her planned course of concurrent chemoradiation.  She is still recovering from the side effects of treatment.  Continue supportive care with antidiarrheals and antiemetics as needed. -The patient is feeling much better today following her procedure last evening.  Abdominal pain significantly improved.  Hospitalist planning to discontinue Foley catheter today.  Ongoing management per urology/hospitalist. -The patient has leukopenia this morning.  WBC on admission was completely normal.  She is afebrile.  Recommend close monitoring.  Will add on  differential to labs from today. -Anemia and thrombocytopenia overall stable.  Likely due to recent chemotherapy, UTI, and medication effect.    LOS: 1 day   Mikey Bussing, DNP, AGPCNP-BC, AOCNP 03/27/20  Addendum  I have seen the patient, examined her. I agree with the assessment and and plan and have edited the notes.   I have reviewed her chart including lab and scan finding, and discussed with pt. Her BM has improved, perianal skin has healed well, I do not suspect she has infectious colitis or UTI, or other infections, so I think we can stop her iv antibiotics vanco and cipro. I spoke with Dr. Ivan Croft. Pt is frustrated about her residual pain after her urology procedure last night, I encourage her to drink more water and take pain meds as needed. I will see her as needed before discharge. Will set up her f/u in 1-2 weeks  Truitt Merle  03/27/2020

## 2020-03-28 ENCOUNTER — Telehealth: Payer: Self-pay | Admitting: Hematology

## 2020-03-28 ENCOUNTER — Inpatient Hospital Stay (HOSPITAL_COMMUNITY): Payer: BC Managed Care – PPO

## 2020-03-28 ENCOUNTER — Encounter (HOSPITAL_COMMUNITY): Payer: Self-pay | Admitting: Internal Medicine

## 2020-03-28 DIAGNOSIS — R1031 Right lower quadrant pain: Secondary | ICD-10-CM | POA: Diagnosis not present

## 2020-03-28 MED ORDER — IOHEXOL 9 MG/ML PO SOLN
ORAL | Status: AC
  Administered 2020-03-28: 04:00:00 500 mL
  Filled 2020-03-28: qty 1000

## 2020-03-28 MED ORDER — IOHEXOL 12 MG/ML PO SOLN
1000.0000 mL | ORAL | Status: AC
  Administered 2020-03-28: 1000 mL via ORAL

## 2020-03-28 MED ORDER — HYDROMORPHONE HCL 1 MG/ML IJ SOLN
0.5000 mg | Freq: Once | INTRAMUSCULAR | Status: AC
  Administered 2020-03-28: 03:00:00 0.5 mg via INTRAVENOUS
  Filled 2020-03-28: qty 0.5

## 2020-03-28 MED ORDER — ACETAMINOPHEN 10 MG/ML IV SOLN
1000.0000 mg | Freq: Four times a day (QID) | INTRAVENOUS | Status: AC
  Administered 2020-03-28 – 2020-03-29 (×4): 1000 mg via INTRAVENOUS
  Filled 2020-03-28 (×5): qty 100

## 2020-03-28 MED ORDER — KETOROLAC TROMETHAMINE 30 MG/ML IJ SOLN
30.0000 mg | Freq: Three times a day (TID) | INTRAMUSCULAR | Status: DC
  Administered 2020-03-28 – 2020-03-30 (×6): 30 mg via INTRAVENOUS
  Filled 2020-03-28 (×6): qty 1

## 2020-03-28 MED ORDER — IOHEXOL 300 MG/ML  SOLN
100.0000 mL | Freq: Once | INTRAMUSCULAR | Status: AC | PRN
  Administered 2020-03-28: 07:00:00 100 mL via INTRAVENOUS

## 2020-03-28 MED ORDER — ALUM & MAG HYDROXIDE-SIMETH 200-200-20 MG/5ML PO SUSP
30.0000 mL | ORAL | Status: DC | PRN
  Administered 2020-03-28: 01:00:00 30 mL via ORAL
  Filled 2020-03-28: qty 30

## 2020-03-28 MED ORDER — METHYLPREDNISOLONE SODIUM SUCC 125 MG IJ SOLR
60.0000 mg | Freq: Four times a day (QID) | INTRAMUSCULAR | Status: DC
  Administered 2020-03-28 – 2020-03-30 (×8): 60 mg via INTRAVENOUS
  Filled 2020-03-28 (×8): qty 2

## 2020-03-28 NOTE — Progress Notes (Signed)
PROGRESS NOTE    Melinda [name]  GEX:[id number] DOB: [birth year] DOA: 03/26/2020 PCP: Crist Infante, MD    Brief Narrative-59 year old female with past medical history of recurrent anal squamous cell carcinoma (most recently treated with chemoradiation with mitomycin and 5-FU, last treatment 03/07/2020), type 2 diabetes, hyperlipidemia who was recently admitted from 10/7-10/14 for radiation colitis and E. coli and MRSA UTI (discharged on Bactrim) who presented to the ED with complaints of worsening lower abdominal pain since her discharge.  She was found to have obstructing right ureteral calculus with hydronephrosis now status post ureteroscopic stone removal and lithotripsy.  Assessment & Plan:   Principal Problem:   Abdominal pain Active Problems:   Dyslipidemia   Squamous cell cancer, anus (HCC)   Ileitis   Hydroureteronephrosis   Acute lower UTI   #1 obstructing right ureteral calculus with hydroureteronephrosis status post right ureteroscopy and stone removal, ureteroscopic laser lithotripsy and cystoscopy, status post right distal ureteral stent removal.  Stent not left in place as kidney noted to be draining well.  Foley taken out 03/27/2020.  Patient able to urinate with some bladder spasm.  Overnight patient developed 10 out of 10 abdominal pain relieved with IV Dilaudid.  A repeat CT of the abdomen done today shows progressive right hydroureteronephrosis in the setting of lower right ureteral thickening ongoing distal ileitis. Renal function stable at BUN 9 and creatinine 0.91.  Discussed with Dr. Louis Meckel who reviewed the CT abdomen.  Recommending IV steroids Toradol and Tylenol to decrease inflammation.  Patient also very nauseous and with decreased p.o. intake.  Continue IV fluids.  #2 ileitis -CT abdomen shows recurrent infectious/inflammatory changes of the distal ileum decreased cecal wall thickening.    #3 recurrent anal cancer status post chemoradiation  #4 hyperlipidemia  on statin  #5 recent E. coli and MRSA UTI urine culture 03/10/2020 with E. coli and MRSA.  She was sent home on Bactrim.  Urine culture this admission shows insignificant growth.  All the antibiotics were stopped 03/27/2020.  Estimated body mass index is 23.61 kg/m as calculated from the following:   Height as of this encounter: 5\' 6"  (1.676 m).   Weight as of this encounter: 66.4 kg.  DVT prophylaxis: Lovenox  code Status: Full code Family Communication: None at bedside  disposition Plan:  Status is: Inpatient   Dispo: The patient is from: Home              Anticipated d/c is to: Home              Anticipated d/c date is: 2 days              Patient currently is not medically stable to d/c.    Consultants:   Urology  Procedures: Cystoscopy 03/26/2020 Antimicrobials: Vanco and Cipro and Flagyl Subjective: Patient resting in bed had severe pain overnight 10 out of 10 abdominal pain is stat repeat CT of the abdomen shows progressive hydroureteronephrosis. Objective: Vitals:   03/27/20 1206 03/27/20 1345 03/27/20 2118 03/28/20 0509  BP: 133/71 104/60 117/60 121/60  Pulse: 84 87 77 83  Resp:  17 18 20   Temp:  98.5 F (36.9 C) 98.3 F (36.8 C) 97.7 F (36.5 C)  TempSrc:  Oral Oral Oral  SpO2:  92% 97% 94%  Weight:      Height:        Intake/Output Summary (Last 24 hours) at 03/28/2020 1014 Last data filed at 03/28/2020 0800 Gross per 24 hour  Intake 3043.[place]man  ml  Output 400 ml  Net 2643.22 ml   Filed Weights   03/26/20 0127  Weight: 66.4 kg    Examination:  General exam: Appears calm and comfortable  Respiratory system: Clear to auscultation. Respiratory effort normal. Cardiovascular system: S1 & S2 heard, RRR. No JVD, murmurs, rubs, gallops or clicks. No pedal edema. Gastrointestinal system: Abdomen is nondistended, soft and tender. No organomegaly or masses felt. Normal bowel sounds heard. Central nervous system: Alert and oriented. No focal neurological  deficits. Extremities: Symmetric 5 x 5 power. Skin: No rashes, lesions or ulcers Psychiatry: Judgement and insight appear normal. Mood & affect appropriate.     Data Reviewed: I have personally reviewed following labs and imaging studies  CBC: Recent Labs  Lab 03/25/20 1436 03/26/20 0129 03/27/20 0431  WBC 3.3* 4.4 1.6*  NEUTROABS 2.3  --  0.9*  HGB 11.8* 11.8* 9.0*  HCT 37.1 36.4 28.8*  MCV 84.7 83.7 87.3  PLT 201 226 947*   Basic Metabolic Panel: Recent Labs  Lab 03/25/20 1436 03/26/20 0129 03/27/20 0431  NA 136 134* 138  K 4.3 3.9 3.6  CL 103 103 103  CO2 28 21* 26  GLUCOSE 119* 143* 86  BUN 15 18 9   CREATININE 0.84 0.77 0.91  CALCIUM 9.7 9.1 8.6*   GFR: Estimated Creatinine Clearance: 60.8 mL/min (by C-G formula based on SCr of 0.91 mg/dL). Liver Function Tests: Recent Labs  Lab 03/25/20 1436 03/26/20 0129  AST 15 17  ALT 11 13  ALKPHOS 67 59  BILITOT 0.4 0.6  PROT 6.9 7.2  ALBUMIN 3.0* 3.6   Recent Labs  Lab 03/26/20 0129  LIPASE 44   No results for input(s): AMMONIA in the last 168 hours. Coagulation Profile: No results for input(s): INR, PROTIME in the last 168 hours. Cardiac Enzymes: No results for input(s): CKTOTAL, CKMB, CKMBINDEX, TROPONINI in the last 168 hours. BNP (last 3 results) No results for input(s): PROBNP in the last 8760 hours. HbA1C: No results for input(s): HGBA1C in the last 72 hours. CBG: No results for input(s): GLUCAP in the last 168 hours. Lipid Profile: No results for input(s): CHOL, HDL, LDLCALC, TRIG, CHOLHDL, LDLDIRECT in the last 72 hours. Thyroid Function Tests: No results for input(s): TSH, T4TOTAL, FREET4, T3FREE, THYROIDAB in the last 72 hours. Anemia Panel: No results for input(s): VITAMINB12, FOLATE, FERRITIN, TIBC, IRON, RETICCTPCT in the last 72 hours. Sepsis Labs: Recent Labs  Lab 03/26/20 1024  LATICACIDVEN 1.2    Recent Results (from the past 240 hour(s))  Urine Culture     Status: Abnormal     Collection Time: 03/25/20  2:36 PM   Specimen: Urine, Clean Catch  Result Value Ref Range Status   Specimen Description   Final    URINE, CLEAN CATCH Performed at [facility], [address] [place], [place] [address]    Special Requests   Final    NONE Performed at [facility], [place] [address], [place], [place] [address]    Culture (A)  Final    <10,000 COLONIES/mL INSIGNIFICANT GROWTH Performed at [facility] [address] [place], [place] [address]    Report Status 03/26/2020 FINAL  Final  Culture, blood (routine x 2)     Status: None (Preliminary result)   Collection Time: 03/26/20 10:20 AM   Specimen: BLOOD  Result Value Ref Range Status   Specimen Description   Final    BLOOD BLOOD RIGHT FOREARM Performed at  [facility], [place] [address] [place], [place] [address]    Special Requests   Final    BOTTLES DRAWN AEROBIC AND ANAEROBIC Blood Culture adequate volume Performed at [facility] [address], [place], [place] [address]    Culture   Final    NO GROWTH 2 DAYS Performed at [place] [facility] [address] [place], [place] [address]    Report Status PENDING  Incomplete  Respiratory Panel by RT PCR (Flu A&B, Covid) - Nasopharyngeal Swab     Status: None   Collection Time: 03/26/20 10:24 AM   Specimen: Nasopharyngeal Swab  Result Value Ref Range Status   SARS Coronavirus 2 by RT PCR NEGATIVE NEGATIVE Final    Comment: (NOTE) SARS-CoV-2 target nucleic acids are NOT DETECTED.  The SARS-CoV-2 RNA is generally detectable in upper respiratoy specimens during the acute phase of infection. The lowest concentration of SARS-CoV-2 viral copies this assay can detect is 131 copies/mL. A negative result does not preclude SARS-Cov-2 infection and should not be used as the sole basis for treatment or other patient management decisions. A negative result may occur with   improper specimen collection/handling, submission of specimen other than nasopharyngeal swab, presence of viral mutation(s) within the areas targeted by this assay, and inadequate number of viral copies (<131 copies/mL). A negative result must be combined with clinical observations, patient history, and epidemiological information. The expected result is Negative.  Fact Sheet for Patients:  PinkCheek.be  Fact Sheet for Healthcare Providers:  GravelBags.it  This test is no t yet approved or cleared by the [place] FDA and  has been authorized for detection and/or diagnosis of SARS-CoV-2 by FDA under an Emergency Use Authorization (EUA). This EUA will remain  in effect (meaning this test can be used) for the duration of the COVID-19 declaration under Section 564(b)(1) of the Act, 21 U.S.C. section 360bbb-3(b)(1), unless the authorization is terminated or revoked sooner.     Influenza A by PCR NEGATIVE NEGATIVE Final   Influenza B by PCR NEGATIVE NEGATIVE Final    Comment: (NOTE) The Xpert Xpress SARS-CoV-2/FLU/RSV assay is intended as an aid in  the diagnosis of influenza from Nasopharyngeal swab specimens and  should not be used as a sole basis for treatment. Nasal washings and  aspirates are unacceptable for Xpert Xpress SARS-CoV-2/FLU/RSV  testing.  Fact Sheet for Patients: PinkCheek.be  Fact Sheet for Healthcare Providers: GravelBags.it  This test is not yet approved or cleared by the [place] FDA and  has been authorized for detection and/or diagnosis of SARS-CoV-2 by  FDA under an Emergency Use Authorization (EUA). This EUA will remain  in effect (meaning this test can be used) for the duration of the  Covid-19 declaration under Section 564(b)(1) of the Act, 21  U.S.C. section 360bbb-3(b)(1), unless the authorization is  terminated or  revoked. Performed at [facility], [place] [address], [place], [place] [address]   Culture, blood (routine x 2)     Status: None (Preliminary result)   Collection Time: 03/26/20 10:24 AM   Specimen: BLOOD  Result Value Ref Range Status   Specimen Description   Final    BLOOD BLOOD LEFT FOREARM Performed at [facility] [address], [place], [place] [address]    Special Requests   Final    BOTTLES DRAWN AEROBIC AND ANAEROBIC Blood Culture adequate volume Performed at [facility] [address], [place], [place] [address]  Culture   Final    NO GROWTH 2 DAYS Performed at [facility], [place] [address], [place], [place] [address]    Report Status PENDING  Incomplete         Radiology Studies: CT ABDOMEN PELVIS W CONTRAST  Result Date: 03/28/2020 CLINICAL DATA:  Acute nonlocalized abdominal pain EXAM: CT ABDOMEN AND PELVIS WITH CONTRAST TECHNIQUE: Multidetector CT imaging of the abdomen and pelvis was performed using the standard protocol following bolus administration of intravenous contrast. CONTRAST:  14mL OMNIPAQUE IOHEXOL 300 MG/ML  SOLN COMPARISON:  Two days ago FINDINGS: Lower chest:  Trace pericardial fluid, stable. Hepatobiliary: No focal liver abnormality.No evidence of biliary obstruction or stone. Pancreas: Unremarkable. Spleen: Unremarkable. Adrenals/Urinary Tract: Negative adrenals. Progressive right hydroureteronephrosis despite passage of a stone there is ureteral thickening at the UVJ. No left hydronephrosis. Right renal cortical scarring affecting the upper pole. Punctate upper pole calculus on the right. Gas in the urinary bladder. Stomach/Bowel: Distal ileitis with submucosal low-density edema that is stable if not progressed. Small volume ascites is likely related. Appendectomy. History of anal cancer. No visible in a rectal mass. Vascular/Lymphatic: No acute vascular abnormality. Diffuse atheromatous  plaque involving the aorta and iliacs. No mass or adenopathy. Reproductive:Hysterectomy. Other: Small volume reactive appearing ascites in the pelvis Musculoskeletal: No acute abnormalities. IMPRESSION: 1. Passed right ureteral calculus but progressive right hydroureteronephrosis in the setting of lower right ureteral thickening. 2. Ongoing distal ileitis which could be infectious, inflammatory, or radiation related. 3. Cortical scarring and small calculus of the right kidney. Electronically Signed   By: Monte Fantasia M.D.   On: 03/28/2020 07:14   DG C-Arm 1-60 Min-No Report  Result Date: 03/26/2020 Fluoroscopy was utilized by the requesting physician.  No radiographic interpretation.        Scheduled Meds: . clorazepate  7.5 mg Oral BID  . enoxaparin (LOVENOX) injection  40 mg Subcutaneous Q24H  . feeding supplement  237 mL Oral Q24H  . FLUoxetine  20 mg Oral q morning - 10a  . ketorolac  30 mg Intravenous Q8H  . methylPREDNISolone (SOLU-MEDROL) injection  60 mg Intravenous Q6H  . pantoprazole  40 mg Oral Daily  . phenazopyridine  100 mg Oral TID WC  . protein supplement  1 Scoop Oral TID WC  . rosuvastatin  5 mg Oral QPM   Continuous Infusions: . sodium chloride 150 mL/hr at 03/28/20 0701  . acetaminophen       LOS: 2 days     Georgette Shell, MD  03/28/2020, 10:13 AM

## 2020-03-28 NOTE — Progress Notes (Addendum)
Pt 10/10 pain unrelieved by morphine. MD notified. Waiting for orders. Continuing to monitor.   Dilaudid ordered by provider and given to pt. Pt states pain "feels like a kidney stone and is moving down."

## 2020-03-28 NOTE — Progress Notes (Signed)
HEMATOLOGY-ONCOLOGY PROGRESS NOTE  SUBJECTIVE: The patient requested to see me this morning due to her concerns about some medications.  She also wanted to be transferred to 6 E. oncology floor.  I saw her before noon, she had a severe abdominal pain last night, required IV morphine and Dilaudid.  She is more comfortable this morning.  Urologist Dr. Louis Meckel saw her this morning, and ordered Toradol, IV Tylenol, and Solu-Medrol.  Patient had a poor tolerance to steroids in the past.  Is concerned about side effects.  Oncology History Overview Note  Cancer Staging No matching staging information was found for the patient.    Squamous cell cancer, anus ([place])  07/13/2019 Procedure    EUA, Excision of posterior internal/external hemorrhoid under the care of Dr. Hassell Done    07/13/2019 Pathology Results   - Invasive moderately differentiated squamous cell carcinoma, 1.4 cm.  See comment  - Carcinoma invades for depth of 0.4 cm  - Deep resection margin is negative for carcinoma (0.2 cm)  - Lateral mucosal margin is positive for high-grade dysplasia  - No evidence of lymphovascular perineural invasion  Procedure: Local excision  Tumor Site: Anal canal  Tumor Size: 1.4 cm  Histologic Type: Invasive squamous cell carcinoma  Histologic Grade: G2: Moderately differentiated  Tumor Extension: Carcinoma invades superficial anal sphincter muscle  Margins: Uninvolved by tumor  Treatment Effect: N/A  Regional Lymph Nodes: No lymph nodes submitted or found  Pathologic Stage Classification (pTNM, AJCC 8th Edition):  pT1, pNx  Representative Tumor Block: A1  Comment(s): Lateral mucosal margin is positive for high-grade squamous  dysplasia      07/13/2019 Initial Diagnosis   Squamous cell cancer, anus ([place])   09/04/2019 Imaging   CT scan Chest, Abdomen, and Pelvis   IMPRESSION: 1. No evidence of metastatic disease in the chest, abdomen or pelvis. 2. No discrete anorectal mass. 3. Chronic findings  include: Punctate nonobstructing upper right renal stones and chronic right renal scarring. 4. Aortic Atherosclerosis (ICD10-I70.0).   01/10/2020 Pathology Results   A. ANAL LESION, POSTERIOR MIDLINE, EXCISION:  - Squamous cell carcinoma, moderately differentiated.  Verbally reported by Dr. Saralyn Pilar 1.5 cm, negative margins, depth <1 mm   01/11/2020 Procedure   1. Excision of anal canal lesion (posterior midline) under the care of Dr. Dema Severin     01/28/2020 PET scan   IMPRESSION: 1. Mild focal anal hypermetabolism without discrete mass correlate on the CT images, nonspecific, differential includes postsurgical change or residual anal tumor. 2. No hypermetabolic locoregional or distant metastatic disease. 3. Chronic findings include: Aortic Atherosclerosis (ICD10-I70.0). Diffuse hepatic steatosis. Nonobstructing right nephrolithiasis.     02/04/2020 - 03/07/2020 Radiation Therapy   concurrent chemoRT by Dr Lisbeth Renshaw with Mitomycin and 5FU starting 02/04/20-03/07/20. The last 4 session cancelled due to poor toleration.    02/04/2020 - 03/03/2020 Chemotherapy   Concurrent chemoRT with Mitomycin and 5FU on week 1 and 5 starting 02/04/20-03/03/20   03/13/2020 Imaging   CT AP W contrast  IMPRESSION: Diffuse wall thickening with inflammatory changes involving a long segment of distal ileum, cecum, and sigmoid colon likely represents enteritis/colitis. This could be due to infectious, or inflammatory nature, not thought to represent ischemic colitis.   Hepatic steatosis   Trace pericardial effusion   Small free fluid in the deep pelvis      REVIEW OF SYSTEMS:   Constitutional: No fevers or chills documented Eyes: Denies blurriness of vision Ears, nose, mouth, throat, and face: Denies mucositis or sore throat Respiratory: Denies cough,  dyspnea or wheezes Cardiovascular: Denies palpitation, chest discomfort Gastrointestinal: Abdominal pain better this morning, denies nausea vomiting, states  that she had diarrhea yesterday.  Has not eaten yet today. Skin: Denies abnormal skin rashes Lymphatics: Denies new lymphadenopathy or easy bruising Neurological:Denies numbness, tingling or new weaknesses Behavioral/Psych: Mood is stable, no new changes  Extremities: No lower extremity edema All other systems were reviewed with the patient and are negative.  I have reviewed the past medical history, past surgical history, social history and family history with the patient and they are unchanged from previous note.   PHYSICAL EXAMINATION: ECOG PERFORMANCE STATUS: 3 - Symptomatic, >50% confined to bed  Vitals:   03/28/20 1331 03/28/20 1658  BP: 128/62 (!) 111/59  Pulse: 75 70  Resp: 16 16  Temp: 98.7 F (37.1 C) 98.4 F (36.9 C)  SpO2: 96% 91%   Filed Weights   03/26/20 0127  Weight: 146 lb 4.8 oz (66.4 kg)    Intake/Output from previous day: 10/21 0701 - 10/22 0700 In: 3103.2 [P.O.:630; I.V.:2073.2] Out: 750 [Urine:750]  GENERAL:alert, no distress and comfortable SKIN: skin color, texture, turgor are normal, no rashes or significant lesions LUNGS: clear to auscultation and percussion with normal breathing effort HEART: regular rate & rhythm and no murmurs and no lower extremity edema ABDOMEN:abdomen soft, non-tender and normal bowel sounds NEURO: alert & oriented x 3 with fluent speech, no focal motor/sensory deficits  LABORATORY DATA:  I have reviewed the data as listed CMP Latest Ref Rng & Units 03/27/2020 03/26/2020 03/25/2020  Glucose 70 - 99 mg/dL 86 143(H) 119(H)  BUN 8 - 23 mg/dL 9 18 15   Creatinine 0.44 - 1.00 mg/dL 0.91 0.77 0.84  Sodium 135 - 145 mmol/L 138 134(L) 136  Potassium 3.5 - 5.1 mmol/L 3.6 3.9 4.3  Chloride 98 - 111 mmol/L 103 103 103  CO2 22 - 32 mmol/L 26 21(L) 28  Calcium 8.9 - 10.3 mg/dL 8.6(L) 9.1 9.7  Total Protein 6.5 - 8.1 g/dL - 7.2 6.9  Total Bilirubin 0.3 - 1.2 mg/dL - 0.6 0.4  Alkaline Phos 38 - 126 U/L - 59 67  AST 15 - 41 U/L -  17 15  ALT 0 - 44 U/L - 13 11    Lab Results  Component Value Date   WBC 1.6 (L) 03/27/2020   HGB 9.0 (L) 03/27/2020   HCT 28.8 (L) 03/27/2020   MCV 87.3 03/27/2020   PLT 131 (L) 03/27/2020   NEUTROABS 0.9 (L) 03/27/2020    CT ABDOMEN PELVIS W CONTRAST  Result Date: 03/28/2020 CLINICAL DATA:  Acute nonlocalized abdominal pain EXAM: CT ABDOMEN AND PELVIS WITH CONTRAST TECHNIQUE: Multidetector CT imaging of the abdomen and pelvis was performed using the standard protocol following bolus administration of intravenous contrast. CONTRAST:  129mL OMNIPAQUE IOHEXOL 300 MG/ML  SOLN COMPARISON:  Two days ago FINDINGS: Lower chest:  Trace pericardial fluid, stable. Hepatobiliary: No focal liver abnormality.No evidence of biliary obstruction or stone. Pancreas: Unremarkable. Spleen: Unremarkable. Adrenals/Urinary Tract: Negative adrenals. Progressive right hydroureteronephrosis despite passage of a stone there is ureteral thickening at the UVJ. No left hydronephrosis. Right renal cortical scarring affecting the upper pole. Punctate upper pole calculus on the right. Gas in the urinary bladder. Stomach/Bowel: Distal ileitis with submucosal low-density edema that is stable if not progressed. Small volume ascites is likely related. Appendectomy. History of anal cancer. No visible in a rectal mass. Vascular/Lymphatic: No acute vascular abnormality. Diffuse atheromatous plaque involving the aorta and iliacs. No mass or  adenopathy. Reproductive:Hysterectomy. Other: Small volume reactive appearing ascites in the pelvis Musculoskeletal: No acute abnormalities. IMPRESSION: 1. Passed right ureteral calculus but progressive right hydroureteronephrosis in the setting of lower right ureteral thickening. 2. Ongoing distal ileitis which could be infectious, inflammatory, or radiation related. 3. Cortical scarring and small calculus of the right kidney. Electronically Signed   By: Monte Fantasia M.D.   On: 03/28/2020 07:14    CT ABDOMEN PELVIS W CONTRAST  Result Date: 03/26/2020 CLINICAL DATA:  Increasing abdominal pain since recent discharge EXAM: CT ABDOMEN AND PELVIS WITH CONTRAST TECHNIQUE: Multidetector CT imaging of the abdomen and pelvis was performed using the standard protocol following bolus administration of intravenous contrast. CONTRAST:  161mL OMNIPAQUE IOHEXOL 300 MG/ML  SOLN COMPARISON:  03/13/2020 FINDINGS: Lower chest: No acute abnormality. Hepatobiliary: Probable hepatic steatosis. No focal liver lesion. Gallbladder is unremarkable. No biliary dilatation. Pancreas: Unremarkable. Spleen: Unremarkable. Adrenals/Urinary Tract: Adrenals unremarkable. Punctate nonobstructing calculus of the upper pole the right kidney. There is right upper pole cortical scarring. Mild right hydroureteronephrosis. This is secondary to an obstructing 3 mm calculus of the distal ureter, which was present on the prior study and has moved slightly inferiorly. Bladder is unremarkable. Stomach/Bowel: Stomach is within normal limits. There is persistent wall thickening involving the terminal ileum and adjacent distal ileum. Decreased cecal wall thickening. No bowel obstruction. Vascular/Lymphatic: Aortic atherosclerosis. No enlarged lymph nodes identified. Reproductive: Status post hysterectomy. No adnexal masses. Other: Small volume abdominopelvic free fluid. Abdominal wall is unremarkable. Musculoskeletal: No acute osseous abnormality. IMPRESSION: New mild right hydroureteronephrosis. Secondary to an obstructing 3 mm distal right ureteral calculus. This was present on the prior study and has moved slightly inferiorly/distally. Residual or recurrent infectious/inflammatory changes of the distal ileum. Decreased cecal wall thickening. Electronically Signed   By: Macy Mis M.D.   On: 03/26/2020 09:39   CT ABDOMEN PELVIS W CONTRAST  Result Date: 03/13/2020 CLINICAL DATA:  Abdominal pain and fever, history of squamous cell EXAM: CT  ABDOMEN AND PELVIS WITH CONTRAST TECHNIQUE: Multidetector CT imaging of the abdomen and pelvis was performed using the standard protocol following bolus administration of intravenous contrast. CONTRAST:  133mL OMNIPAQUE IOHEXOL 300 MG/ML  SOLN COMPARISON:  September 04, 2019 FINDINGS: Lower chest: The visualized heart size within normal limits. There is a trace pericardial effusion. No hiatal hernia. The visualized portions of the lungs are clear. Hepatobiliary: There is diffuse low density seen throughout the liver parenchyma. No focal hepatic lesion is seen. No intra or extrahepatic biliary ductal dilatation. The main portal vein is patent. No evidence of calcified gallstones, gallbladder wall thickening or biliary dilatation. Pancreas: Unremarkable. No pancreatic ductal dilatation or surrounding inflammatory changes. Spleen: Normal in size without focal abnormality. Adrenals/Urinary Tract: Both adrenal glands appear normal. There is a punctate calcifications seen in the upper pole of the right kidney. There is areas of scarring seen within the upper pole. A 1 cm low-density lesion seen in the lower pole the right kidney. No left-sided renal or collecting system calculi. There is mild diffuse bladder wall thickening. Stomach/Bowel: The stomach and proximal small bowel are unremarkable. There is a long segment of ileum with diffuse wall thickening surrounding fat stranding changes to the terminal ileum. There is also wall thickening seen around the distal cecal pole. Mild wall thickening with surrounding fat stranding changes are seen around the sigmoid colon extending to the sigmoid rectal junction. No loculated fluid collections or free air however are noted. Vascular/Lymphatic: There are no enlarged mesenteric, retroperitoneal,  or pelvic lymph nodes. Scattered aortic atherosclerotic calcifications are seen without aneurysmal dilatation. Reproductive: The patient is status post hysterectomy. No adnexal masses or  collections seen. Other: A small amount of free fluid is seen within the deep cul-de-sac. Musculoskeletal: No acute or significant osseous findings. IMPRESSION: Diffuse wall thickening with inflammatory changes involving a long segment of distal ileum, cecum, and sigmoid colon likely represents enteritis/colitis. This could be due to infectious, or inflammatory nature, not thought to represent ischemic colitis. Hepatic steatosis Trace pericardial effusion Small free fluid in the deep pelvis Electronically Signed   By: Prudencio Pair M.D.   On: 03/13/2020 21:30   DG C-Arm 1-60 Min-No Report  Result Date: 03/26/2020 Fluoroscopy was utilized by the requesting physician.  No radiographic interpretation.   IR PICC PLACEMENT RIGHT <5 YRS INC IMG GUIDE  Result Date: 03/03/2020 INDICATION: Patient with history of squamous cell carcinoma of the anus; central venous access requested for chemotherapy. EXAM: RIGHT UPPER EXTREMITY PICC LINE PLACEMENT WITH ULTRASOUND AND FLUOROSCOPIC GUIDANCE MEDICATIONS: 1% lidocaine to skin and subcutaneous tissue ANESTHESIA/SEDATION: None FLUOROSCOPY TIME:  Fluoroscopy Time: 54  seconds (6 mGy). COMPLICATIONS: None immediate. PROCEDURE: The patient was advised of the possible risks and complications and agreed to undergo the procedure. The patient was then brought to the angiographic suite for the procedure. The right arm was prepped with chlorhexidine, draped in the usual sterile fashion using maximum barrier technique (cap and mask, sterile gown, sterile gloves, large sterile sheet, hand hygiene and cutaneous antisepsis) and infiltrated locally with 1% Lidocaine. Ultrasound demonstrated patency of the right basilic vein, and this was documented with an image. Under real-time ultrasound guidance, this vein was accessed with a 21 gauge micropuncture needle and image documentation was performed. A 0.018 wire was introduced in to the vein. Over this, a 5 [place] single lumen  power-injectable PICC was advanced to the lower SVC/right atrial junction. Fluoroscopy during the procedure and fluoro spot radiograph confirms appropriate catheter position. The catheter was flushed and covered with a sterile dressing. Catheter length: 40 cm IMPRESSION: Successful right arm Power PICC line placement with ultrasound and fluoroscopic guidance. The catheter is ready for use. Read by: Rowe Robert, PA-C Electronically Signed   By: Corrie Mckusick D.O.   On: 03/03/2020 11:20    ASSESSMENT AND PLAN: 1.  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  Obstructing right ureteral calculus with hydroureteronephrosis 3.  Ileitis 4.  Recent E. coli and MRSA UTI 5.  Leukopenia 6.  Anemia 7.  Mild thrombocytopenia  Plan -I reviewed her CT scan from last night, which showed no new stones, but progressive right hydroureteronephrosis -I agree with urologist opinion, I explained the rationale of steroids, and NSAIDs, Solu-Medrol was ordered at the 60 mg, moderate/I think she should be able to tolerate.  After lengthy discussion, she agreed to proceed. -Okay to use low-dose benzo if she has side effect from steroid -Continue supportive care. -Per patient's request, we will transfer her to 6 E. -Please call us over the weekend if needed. -I communicated with Dr. Zigmund Daniel and her nurse   LOS: 2 days   Truitt Merle, MD 03/28/20  .

## 2020-03-28 NOTE — Progress Notes (Signed)
Attempt to call and give report, nurse unable to receive report at this time and will call back.

## 2020-03-28 NOTE — Telephone Encounter (Signed)
Scheduled appointment per 10/22 sch msg. Spoke to patient who is aware of appointment date and time.  

## 2020-03-28 NOTE — Progress Notes (Signed)
Urology Inpatient Progress Report  Abdominal pain [R10.9]  Procedure(s): CYSTOSCOPY/URETEROSCOPY/HOLMIUM LASER 2 Days Post-Op   Intv/Subj: Patient continues to have profound abdominal pain.  This has been hard to manage.  She has had repeat CT scan this morning that demonstrated edema at the right UVJ and persistent hydronephrosis, despite the kidney stone being removed. Afebrile.  Vital signs been stable.  Principal Problem:   Abdominal pain Active Problems:   Dyslipidemia   Squamous cell cancer, anus (HCC)   Ileitis   Hydroureteronephrosis   Acute lower UTI  Current Facility-Administered Medications  Medication Dose Route Frequency Provider Last Rate Last Admin  . 0.9 %  sodium chloride infusion   Intravenous Continuous Georgette Shell, MD 150 mL/hr at 03/28/20 0701 New Bag at 03/28/20 0701  . acetaminophen (TYLENOL) tablet 650 mg  650 mg Oral Q6H PRN Georgette Shell, MD   650 mg at 03/27/20 1154  . alum & mag hydroxide-simeth (MAALOX/MYLANTA) 200-200-20 MG/5ML suspension 30 mL  30 mL Oral Q4H PRN Georgette Shell, MD   30 mL at 03/28/20 0034  . clorazepate (TRANXENE) tablet 7.5 mg  7.5 mg Oral BID Georgette Shell, MD   7.5 mg at 03/28/20 0931  . diphenhydrAMINE (BENADRYL) injection 12.5-25 mg  12.5-25 mg Intravenous Q6H PRN Lang Snow, FNP   12.5 mg at 03/27/20 1001  . enoxaparin (LOVENOX) injection 40 mg  40 mg Subcutaneous Q24H Marva Panda E, MD      . feeding supplement (ENSURE ENLIVE / ENSURE PLUS) liquid 237 mL  237 mL Oral Q24H Georgette Shell, MD   237 mL at 03/28/20 0931  . FLUoxetine (PROZAC) capsule 20 mg  20 mg Oral q morning - 10a Georgette Shell, MD   20 mg at 03/28/20 0931  . metoprolol tartrate (LOPRESSOR) injection 5 mg  5 mg Intravenous Q8H PRN Harold Hedge, MD      . morphine (MSIR) tablet 15 mg  15 mg Oral Q4H PRN Harold Hedge, MD   15 mg at 03/28/20 0126  . morphine 2 MG/ML injection 2 mg  2 mg Intravenous Q2H PRN Harold Hedge, MD   2 mg at 03/28/20 0034  . ondansetron (ZOFRAN) tablet 4 mg  4 mg Oral Q6H PRN Harold Hedge, MD       Or  . ondansetron [facility]) injection 4 mg  4 mg Intravenous Q6H PRN Harold Hedge, MD   4 mg at 03/27/20 1000  . pantoprazole (PROTONIX) EC tablet 40 mg  40 mg Oral Daily Georgette Shell, MD   40 mg at 03/28/20 0931  . phenazopyridine (PYRIDIUM) tablet 100 mg  100 mg Oral TID WC Georgette Shell, MD   100 mg at 03/28/20 0755  . protein supplement (RESOURCE BENEPROTEIN) powder packet 6 g  1 Scoop Oral TID WC Georgette Shell, MD      . rosuvastatin (CRESTOR) tablet 5 mg  5 mg Oral QPM Georgette Shell, MD   5 mg at 03/27/20 1800     Objective: Vital: Vitals:   03/27/20 1206 03/27/20 1345 03/27/20 2118 03/28/20 0509  BP: 133/71 104/60 117/60 121/60  Pulse: 84 87 77 83  Resp:  17 18 20   Temp:  98.5 F (36.9 C) 98.3 F (36.8 C) 97.7 F (36.5 C)  TempSrc:  Oral Oral Oral  SpO2:  92% 97% 94%  Weight:      Height:       I/Os: I/O last  3 completed shifts: In: 3103.2 [P.O.:630; I.V.:2073.2; Other:400] Out: 1850 [Urine:1850]  Physical Exam:  General: Patient is in no apparent distress Lungs: Normal respiratory effort, chest expands symmetrically. GI: The abdomen is soft and diffusely tender - mild right CVA tenderness. Ext: lower extremities symmetric  Lab Results: Recent Labs    03/25/20 1436 03/26/20 0129 03/27/20 0431  WBC 3.3* 4.4 1.6*  HGB 11.8* 11.8* 9.0*  HCT 37.1 36.4 28.8*   Recent Labs    03/25/20 1436 03/26/20 0129 03/27/20 0431  NA 136 134* 138  K 4.3 3.9 3.6  CL 103 103 103  CO2 28 21* 26  GLUCOSE 119* 143* 86  BUN 15 18 9   CREATININE 0.84 0.77 0.91  CALCIUM 9.7 9.1 8.6*   No results for input(s): LABPT, INR in the last 72 hours. No results for input(s): LABURIN in the last 72 hours. Results for orders placed or performed during the hospital encounter of 03/26/20  Culture, blood (routine x 2)     Status: None  (Preliminary result)   Collection Time: 03/26/20 10:20 AM   Specimen: BLOOD  Result Value Ref Range Status   Specimen Description   Final    BLOOD BLOOD RIGHT FOREARM Performed at [facility] [address], [place], [place] [address]    Special Requests   Final    BOTTLES DRAWN AEROBIC AND ANAEROBIC Blood Culture adequate volume Performed at [facility] [address], [place], [place] [address]    Culture   Final    NO GROWTH 2 DAYS Performed at [facility] [address], [place], [place] [address]    Report Status PENDING  Incomplete  Respiratory Panel by RT PCR (Flu A&B, Covid) - Nasopharyngeal Swab     Status: None   Collection Time: 03/26/20 10:24 AM   Specimen: Nasopharyngeal Swab  Result Value Ref Range Status   SARS Coronavirus 2 by RT PCR NEGATIVE NEGATIVE Final    Comment: (NOTE) SARS-CoV-2 target nucleic acids are NOT DETECTED.  The SARS-CoV-2 RNA is generally detectable in upper respiratoy specimens during the acute phase of infection. The lowest concentration of SARS-CoV-2 viral copies this assay can detect is 131 copies/mL. A negative result does not preclude SARS-Cov-2 infection and should not be used as the sole basis for treatment or other patient management decisions. A negative result may occur with  improper specimen collection/handling, submission of specimen other than nasopharyngeal swab, presence of viral mutation(s) within the areas targeted by this assay, and inadequate number of viral copies (<131 copies/mL). A negative result must be combined with clinical observations, patient history, and epidemiological information. The expected result is Negative.  Fact Sheet for Patients:  PinkCheek.be  Fact Sheet for Healthcare Providers:  GravelBags.it  This test is no t yet approved or cleared by the [place] FDA and  has been authorized for  detection and/or diagnosis of SARS-CoV-2 by FDA under an Emergency Use Authorization (EUA). This EUA will remain  in effect (meaning this test can be used) for the duration of the COVID-19 declaration under Section 564(b)(1) of the Act, 21 U.S.C. section 360bbb-3(b)(1), unless the authorization is terminated or revoked sooner.     Influenza A by PCR NEGATIVE NEGATIVE Final   Influenza B by PCR NEGATIVE NEGATIVE Final    Comment: (NOTE) The Xpert Xpress SARS-CoV-2/FLU/RSV assay is intended as an aid in  the diagnosis of influenza from Nasopharyngeal swab specimens and  should not be used as a  sole basis for treatment. Nasal washings and  aspirates are unacceptable for Xpert Xpress SARS-CoV-2/FLU/RSV  testing.  Fact Sheet for Patients: PinkCheek.be  Fact Sheet for Healthcare Providers: GravelBags.it  This test is not yet approved or cleared by the [place] FDA and  has been authorized for detection and/or diagnosis of SARS-CoV-2 by  FDA under an Emergency Use Authorization (EUA). This EUA will remain  in effect (meaning this test can be used) for the duration of the  Covid-19 declaration under Section 564(b)(1) of the Act, 21  U.S.C. section 360bbb-3(b)(1), unless the authorization is  terminated or revoked. Performed at [facility], [place] [address] [place], [place] [address]   Culture, blood (routine x 2)     Status: None (Preliminary result)   Collection Time: 03/26/20 10:24 AM   Specimen: BLOOD  Result Value Ref Range Status   Specimen Description   Final    BLOOD BLOOD LEFT FOREARM Performed at [facility] [address] [place], [place] [address]    Special Requests   Final    BOTTLES DRAWN AEROBIC AND ANAEROBIC Blood Culture adequate volume Performed at [facility] [address], [place], [place] [address]    Culture   Final    NO GROWTH 2  DAYS Performed at [facility] [address] [place], [place] [address]    Report Status PENDING  Incomplete    Studies/Results: CT ABDOMEN PELVIS W CONTRAST  Result Date: 03/28/2020 CLINICAL DATA:  Acute nonlocalized abdominal pain EXAM: CT ABDOMEN AND PELVIS WITH CONTRAST TECHNIQUE: Multidetector CT imaging of the abdomen and pelvis was performed using the standard protocol following bolus administration of intravenous contrast. CONTRAST:  113mL OMNIPAQUE IOHEXOL 300 MG/ML  SOLN COMPARISON:  Two days ago FINDINGS: Lower chest:  Trace pericardial fluid, stable. Hepatobiliary: No focal liver abnormality.No evidence of biliary obstruction or stone. Pancreas: Unremarkable. Spleen: Unremarkable. Adrenals/Urinary Tract: Negative adrenals. Progressive right hydroureteronephrosis despite passage of a stone there is ureteral thickening at the UVJ. No left hydronephrosis. Right renal cortical scarring affecting the upper pole. Punctate upper pole calculus on the right. Gas in the urinary bladder. Stomach/Bowel: Distal ileitis with submucosal low-density edema that is stable if not progressed. Small volume ascites is likely related. Appendectomy. History of anal cancer. No visible in a rectal mass. Vascular/Lymphatic: No acute vascular abnormality. Diffuse atheromatous plaque involving the aorta and iliacs. No mass or adenopathy. Reproductive:Hysterectomy. Other: Small volume reactive appearing ascites in the pelvis Musculoskeletal: No acute abnormalities. IMPRESSION: 1. Passed right ureteral calculus but progressive right hydroureteronephrosis in the setting of lower right ureteral thickening. 2. Ongoing distal ileitis which could be infectious, inflammatory, or radiation related. 3. Cortical scarring and small calculus of the right kidney. Electronically Signed   By: Monte Fantasia M.D.   On: 03/28/2020 07:14   DG C-Arm 1-60 Min-No Report  Result Date: 03/26/2020 Fluoroscopy was utilized by the  requesting physician.  No radiographic interpretation.    Assessment: Procedure(s): CYSTOSCOPY/URETEROSCOPY/HOLMIUM LASER/STENT PLACEMENT, 2 Days Post-Op  -persistent abdominal pain and right flank pain.  CT scan shows stable to worsening ileitis as well as thickening of the right UVJ and slightly worsening hydronephrosis.  This is most consistent with inflammation, less likely infectious given her normal laboratory values.  This AM the patient's pain is mostly abdominal - flank/back pain less bothersome.  Plan: Recommend aggressive pain management with Toradol, IV Tylenol, and a steroid burst.  I suspect that this will help with  the inflammation and the pain in the kidney will resolve.  If she has persistent pain over the next 48 hours she may be best served with placement of a right ureteral stent.  Please page Urology oncall over the weekend for any further issues or concerns.   Louis Meckel, MD Urology 03/28/2020, 10:10 AM

## 2020-03-29 LAB — COMPREHENSIVE METABOLIC PANEL
ALT: 13 U/L (ref 0–44)
AST: 14 U/L — ABNORMAL LOW (ref 15–41)
Albumin: 2.7 g/dL — ABNORMAL LOW (ref 3.5–5.0)
Alkaline Phosphatase: 53 U/L (ref 38–126)
Anion gap: 6 (ref 5–15)
BUN: 6 mg/dL — ABNORMAL LOW (ref 8–23)
CO2: 23 mmol/L (ref 22–32)
Calcium: 8.8 mg/dL — ABNORMAL LOW (ref 8.9–10.3)
Chloride: 111 mmol/L (ref 98–111)
Creatinine, Ser: 0.68 mg/dL (ref 0.44–1.00)
GFR, Estimated: >60 mL/min (ref >60)
Glucose, Bld: 253 mg/dL — ABNORMAL HIGH (ref 70–99)
Potassium: 3.9 mmol/L (ref 3.5–5.1)
Sodium: 140 mmol/L (ref 135–145)
Total Bilirubin: 0.5 mg/dL (ref 0.3–1.2)
Total Protein: 5.7 g/dL — ABNORMAL LOW (ref 6.5–8.1)

## 2020-03-29 LAB — CBC
HCT: 31.3 % — ABNORMAL LOW (ref 36.0–46.0)
Hemoglobin: 10.1 g/dL — ABNORMAL LOW (ref 12.0–15.0)
MCH: 28.1 pg (ref 26.0–34.0)
MCHC: 32.3 g/dL (ref 30.0–36.0)
MCV: 87.2 fL (ref 80.0–100.0)
Platelets: 163 10*3/uL (ref 150–400)
RBC: 3.59 MIL/uL — ABNORMAL LOW (ref 3.87–5.11)
RDW: 15.8 % — ABNORMAL HIGH (ref 11.5–15.5)
WBC: 2.5 10*3/uL — ABNORMAL LOW (ref 4.0–10.5)
nRBC: 0 % (ref 0.0–0.2)

## 2020-03-29 MED ORDER — DOCUSATE SODIUM 100 MG PO CAPS
200.0000 mg | ORAL_CAPSULE | Freq: Every day | ORAL | Status: DC
  Administered 2020-03-29 – 2020-03-31 (×3): 200 mg via ORAL
  Filled 2020-03-29 (×3): qty 2

## 2020-03-29 NOTE — Progress Notes (Signed)
PROGRESS NOTE    ANGINETTE ESPEJO  HLK:[id number] DOB: 04-04-1961 DOA: 03/26/2020 PCP: Crist Infante, MD    Brief Narrative-59 year old female with past medical history of recurrent anal squamous cell carcinoma (most recently treated with chemoradiation with mitomycin and 5-FU, last treatment 03/07/2020), type 2 diabetes, hyperlipidemia who was recently admitted from 10/7-10/14 for radiation colitis and E. coli and MRSA UTI (discharged on Bactrim) who presented to the ED with complaints of worsening lower abdominal pain since her discharge.  She was found to have obstructing right ureteral calculus with hydronephrosis now status post ureteroscopic stone removal and lithotripsy.  Assessment & Plan:   Principal Problem:   Abdominal pain Active Problems:   Dyslipidemia   Squamous cell cancer, anus (HCC)   Ileitis   Hydroureteronephrosis   Acute lower UTI   #1 obstructing right ureteral calculus with hydroureteronephrosis status post right ureteroscopy and stone removal, ureteroscopic laser lithotripsy and cystoscopy, status post right distal ureteral stent removal.  Stent not left in place as kidney noted to be draining well.  Foley taken out 03/27/2020.  Patient able to urinate with some bladder spasm.  Pain is better compared to yesterday on IV steroids and Toradol.  Continue the same.  A repeat CT of the abdomen done today shows progressive right hydroureteronephrosis in the setting of lower right ureteral thickening ongoing distal ileitis.  Renal function stable creatinine 0.68.Discussed with Dr. Louis Meckel who reviewed the CT abdomen.  Recommending IV steroids Toradol and Tylenol to decrease inflammation.  Patient also very nauseous and with decreased p.o. intake.  Continue IV fluids.  #2 ileitis -CT abdomen shows recurrent infectious/inflammatory changes of the distal ileum decreased cecal wall thickening.    #3 recurrent anal cancer status post chemoradiation  #4 hyperlipidemia on  statin  #5 recent E. coli and MRSA UTI urine culture 03/10/2020 with E. coli and MRSA.  She was sent home on Bactrim.  Urine culture this admission shows insignificant growth.  All the antibiotics were stopped 03/27/2020.  #6 constipation Colace ordered  Estimated body mass index is 23.61 kg/m as calculated from the following:   Height as of this encounter: 5\' 6"  (1.676 m).   Weight as of this encounter: 66.4 kg.  DVT prophylaxis: Lovenox  code Status: Full code Family Communication: None at bedside  disposition Plan:  Status is: Inpatient   Dispo: The patient is from: Home              Anticipated d/c is to: Home              Anticipated d/c date is: 2 days              Patient currently is not medically stable to d/c.    Consultants:   Urology  Procedures: Cystoscopy 03/26/2020 Antimicrobials: Vanco and Cipro and Flagyl Subjective: She is resting in bed feels better than yesterday on IV steroids Toradol and Tylenol.  However she still has the pain in the right lower quadrant. Objective: Vitals:   03/28/20 1658 03/28/20 2137 03/29/20 0113 03/29/20 0622  BP: (!) 111/59 (!) 111/58 124/61 119/62  Pulse: 70 76 80 79  Resp: 16 18 20 18   Temp: 98.4 F (36.9 C) 98.5 F (36.9 C) 97.9 F (36.6 C) 97.8 F (36.6 C)  TempSrc: Oral Oral Oral Oral  SpO2: 91% 93% 93% 93%  Weight:      Height:        Intake/Output Summary (Last 24 hours) at 03/29/2020 1159 Last data filed  at 03/29/2020 0300 Gross per 24 hour  Intake 3751.86 ml  Output --  Net 3751.86 ml   Filed Weights   03/26/20 0127  Weight: 66.4 kg    Examination:  General exam: Appears calm and comfortable  Respiratory system: Clear to auscultation. Respiratory effort normal. Cardiovascular system: S1 & S2 heard, RRR. No JVD, murmurs, rubs, gallops or clicks. No pedal edema. Gastrointestinal system: Abdomen is nondistended, soft and RLQtender. No organomegaly or masses felt. Normal bowel sounds heard. Central  nervous system: Alert and oriented. No focal neurological deficits. Extremities: Symmetric 5 x 5 power. Skin: No rashes, lesions or ulcers Psychiatry: Judgement and insight appear normal. Mood & affect appropriate.     Data Reviewed: I have personally reviewed following labs and imaging studies  CBC: Recent Labs  Lab 03/25/20 1436 03/26/20 0129 03/27/20 0431 03/29/20 0555  WBC 3.3* 4.4 1.6* 2.5*  NEUTROABS 2.3  --  0.9*  --   HGB 11.8* 11.8* 9.0* 10.1*  HCT 37.1 36.4 28.8* 31.3*  MCV 84.7 83.7 87.3 87.2  PLT 201 226 131* 962   Basic Metabolic Panel: Recent Labs  Lab 03/25/20 1436 03/26/20 0129 03/27/20 0431 03/29/20 0555  NA 136 134* 138 140  K 4.3 3.9 3.6 3.9  CL 103 103 103 111  CO2 28 21* 26 23  GLUCOSE 119* 143* 86 253*  BUN 15 18 9  6*  CREATININE 0.84 0.77 0.91 0.68  CALCIUM 9.7 9.1 8.6* 8.8*   GFR: Estimated Creatinine Clearance: 69.1 mL/min (by C-G formula based on SCr of 0.68 mg/dL). Liver Function Tests: Recent Labs  Lab 03/25/20 1436 03/26/20 0129 03/29/20 0555  AST 15 17 14*  ALT 11 13 13   ALKPHOS 67 59 53  BILITOT 0.4 0.6 0.5  PROT 6.9 7.2 5.7*  ALBUMIN 3.0* 3.6 2.7*   Recent Labs  Lab 03/26/20 0129  LIPASE 44   No results for input(s): AMMONIA in the last 168 hours. Coagulation Profile: No results for input(s): INR, PROTIME in the last 168 hours. Cardiac Enzymes: No results for input(s): CKTOTAL, CKMB, CKMBINDEX, TROPONINI in the last 168 hours. BNP (last 3 results) No results for input(s): PROBNP in the last 8760 hours. HbA1C: No results for input(s): HGBA1C in the last 72 hours. CBG: No results for input(s): GLUCAP in the last 168 hours. Lipid Profile: No results for input(s): CHOL, HDL, LDLCALC, TRIG, CHOLHDL, LDLDIRECT in the last 72 hours. Thyroid Function Tests: No results for input(s): TSH, T4TOTAL, FREET4, T3FREE, THYROIDAB in the last 72 hours. Anemia Panel: No results for input(s): VITAMINB12, FOLATE, FERRITIN, TIBC,  IRON, RETICCTPCT in the last 72 hours. Sepsis Labs: Recent Labs  Lab 03/26/20 1024  LATICACIDVEN 1.2    Recent Results (from the past 240 hour(s))  Urine Culture     Status: Abnormal   Collection Time: 03/25/20  2:36 PM   Specimen: Urine, Clean Catch  Result Value Ref Range Status   Specimen Description   Final    URINE, CLEAN CATCH Performed at [facility], [address], [place], [place] [address]    Special Requests   Final    NONE Performed at [facility], [place] [address] [place], [place] [address]    Culture (A)  Final    <10,000 COLONIES/mL INSIGNIFICANT GROWTH Performed at [facility] [address] [place], [place] [address]    Report Status 03/26/2020 FINAL  Final  Culture, blood (routine x 2)     Status:  None (Preliminary result)   Collection Time: 03/26/20 10:20 AM   Specimen: BLOOD  Result Value Ref Range Status   Specimen Description   Final    BLOOD BLOOD RIGHT FOREARM Performed at [facility] [address] [place], [place] [address]    Special Requests   Final    BOTTLES DRAWN AEROBIC AND ANAEROBIC Blood Culture adequate volume Performed at [facility] [address], [place], [place] [address]    Culture   Final    NO GROWTH 3 DAYS Performed at [facility], [place] [address], [place], [place] [address]    Report Status PENDING  Incomplete  Respiratory Panel by RT PCR (Flu A&B, Covid) - Nasopharyngeal Swab     Status: None   Collection Time: 03/26/20 10:24 AM   Specimen: Nasopharyngeal Swab  Result Value Ref Range Status   SARS Coronavirus 2 by RT PCR NEGATIVE NEGATIVE Final    Comment: (NOTE) SARS-CoV-2 target nucleic acids are NOT DETECTED.  The SARS-CoV-2 RNA is generally detectable in upper respiratoy specimens during the acute phase of infection. The lowest concentration of SARS-CoV-2 viral copies this assay can detect is 131  copies/mL. A negative result does not preclude SARS-Cov-2 infection and should not be used as the sole basis for treatment or other patient management decisions. A negative result may occur with  improper specimen collection/handling, submission of specimen other than nasopharyngeal swab, presence of viral mutation(s) within the areas targeted by this assay, and inadequate number of viral copies (<131 copies/mL). A negative result must be combined with clinical observations, patient history, and epidemiological information. The expected result is Negative.  Fact Sheet for Patients:  PinkCheek.be  Fact Sheet for Healthcare Providers:  GravelBags.it  This test is no t yet approved or cleared by the [place] FDA and  has been authorized for detection and/or diagnosis of SARS-CoV-2 by FDA under an Emergency Use Authorization (EUA). This EUA will remain  in effect (meaning this test can be used) for the duration of the COVID-19 declaration under Section 564(b)(1) of the Act, 21 U.S.C. section 360bbb-3(b)(1), unless the authorization is terminated or revoked sooner.     Influenza A by PCR NEGATIVE NEGATIVE Final   Influenza B by PCR NEGATIVE NEGATIVE Final    Comment: (NOTE) The Xpert Xpress SARS-CoV-2/FLU/RSV assay is intended as an aid in  the diagnosis of influenza from Nasopharyngeal swab specimens and  should not be used as a sole basis for treatment. Nasal washings and  aspirates are unacceptable for Xpert Xpress SARS-CoV-2/FLU/RSV  testing.  Fact Sheet for Patients: PinkCheek.be  Fact Sheet for Healthcare Providers: GravelBags.it  This test is not yet approved or cleared by the [place] FDA and  has been authorized for detection and/or diagnosis of SARS-CoV-2 by  FDA under an Emergency Use Authorization (EUA). This EUA will remain  in effect (meaning  this test can be used) for the duration of the  Covid-19 declaration under Section 564(b)(1) of the Act, 21  U.S.C. section 360bbb-3(b)(1), unless the authorization is  terminated or revoked. Performed at [facility], [place] [address] [place],  [address]   Culture, blood (routine x 2)     Status: None (Preliminary result)   Collection Time: 03/26/20 10:24 AM   Specimen: BLOOD  Result Value Ref Range Status   Specimen Description   Final    BLOOD BLOOD LEFT FOREARM Performed at [facility] [address] [place],  [place] [address]    Special Requests   Final    BOTTLES DRAWN AEROBIC AND ANAEROBIC Blood Culture adequate volume Performed at [facility] [address] [place], [place] [address]    Culture   Final    NO GROWTH 3 DAYS Performed at [facility], [place] [address], [place], [place] [address]    Report Status PENDING  Incomplete         Radiology Studies: CT ABDOMEN PELVIS W CONTRAST  Result Date: 03/28/2020 CLINICAL DATA:  Acute nonlocalized abdominal pain EXAM: CT ABDOMEN AND PELVIS WITH CONTRAST TECHNIQUE: Multidetector CT imaging of the abdomen and pelvis was performed using the standard protocol following bolus administration of intravenous contrast. CONTRAST:  177mL OMNIPAQUE IOHEXOL 300 MG/ML  SOLN COMPARISON:  Two days ago FINDINGS: Lower chest:  Trace pericardial fluid, stable. Hepatobiliary: No focal liver abnormality.No evidence of biliary obstruction or stone. Pancreas: Unremarkable. Spleen: Unremarkable. Adrenals/Urinary Tract: Negative adrenals. Progressive right hydroureteronephrosis despite passage of a stone there is ureteral thickening at the UVJ. No left hydronephrosis. Right renal cortical scarring affecting the upper pole. Punctate upper pole calculus on the right. Gas in the urinary bladder. Stomach/Bowel: Distal ileitis with submucosal low-density edema that is stable if not  progressed. Small volume ascites is likely related. Appendectomy. History of anal cancer. No visible in a rectal mass. Vascular/Lymphatic: No acute vascular abnormality. Diffuse atheromatous plaque involving the aorta and iliacs. No mass or adenopathy. Reproductive:Hysterectomy. Other: Small volume reactive appearing ascites in the pelvis Musculoskeletal: No acute abnormalities. IMPRESSION: 1. Passed right ureteral calculus but progressive right hydroureteronephrosis in the setting of lower right ureteral thickening. 2. Ongoing distal ileitis which could be infectious, inflammatory, or radiation related. 3. Cortical scarring and small calculus of the right kidney. Electronically Signed   By: Monte Fantasia M.D.   On: 03/28/2020 07:14        Scheduled Meds: . clorazepate  7.5 mg Oral BID  . docusate sodium  200 mg Oral Daily  . enoxaparin (LOVENOX) injection  40 mg Subcutaneous Q24H  . feeding supplement  237 mL Oral Q24H  . FLUoxetine  20 mg Oral q morning - 10a  . ketorolac  30 mg Intravenous Q8H  . methylPREDNISolone (SOLU-MEDROL) injection  60 mg Intravenous Q6H  . pantoprazole  40 mg Oral Daily  . phenazopyridine  100 mg Oral TID WC  . protein supplement  1 Scoop Oral TID WC  . rosuvastatin  5 mg Oral QPM   Continuous Infusions: . sodium chloride 150 mL/hr at 03/29/20 0300     LOS: 3 days     Georgette Shell, MD  03/29/2020, 11:59 AM

## 2020-03-30 MED ORDER — METHYLPREDNISOLONE SODIUM SUCC 40 MG IJ SOLR
40.0000 mg | Freq: Four times a day (QID) | INTRAMUSCULAR | Status: DC
  Administered 2020-03-30 – 2020-03-31 (×5): 40 mg via INTRAVENOUS
  Filled 2020-03-30 (×6): qty 1

## 2020-03-30 MED ORDER — KETOROLAC TROMETHAMINE 30 MG/ML IJ SOLN
15.0000 mg | Freq: Three times a day (TID) | INTRAMUSCULAR | Status: DC
  Administered 2020-03-30 – 2020-03-31 (×4): 15 mg via INTRAVENOUS
  Filled 2020-03-30 (×4): qty 1

## 2020-03-30 NOTE — Progress Notes (Signed)
PROGRESS NOTE    Melinda [name]  TML:[id number] DOB: Apr 08, 1961 DOA: 03/26/2020 PCP: Crist Infante, MD    Brief Narrative-59 year old female with past medical history of recurrent anal squamous cell carcinoma (most recently treated with chemoradiation with mitomycin and 5-FU, last treatment 03/07/2020), type 2 diabetes, hyperlipidemia who was recently admitted from 10/7-10/14 for radiation colitis and E. coli and MRSA UTI (discharged on Bactrim) who presented to the ED with complaints of worsening lower abdominal pain since her discharge.  She was found to have obstructing right ureteral calculus with hydronephrosis now status post ureteroscopic stone removal and lithotripsy.  Assessment & Plan:   Principal Problem:   Abdominal pain Active Problems:   Dyslipidemia   Squamous cell cancer, anus (HCC)   Ileitis   Hydroureteronephrosis   Acute lower UTI   #1 obstructing right ureteral calculus with hydroureteronephrosis status post right ureteroscopy and stone removal, ureteroscopic laser lithotripsy and cystoscopy, status post right distal ureteral stent removal.  Stent not left in place as kidney noted to be draining well.  Foley taken out 03/27/2020.  Patient able to urinate with some bladder spasm.  Encourage out of bed ambulate as tolerated. Continue IV steroids and Toradol.  Continue IV fluids.  Patient is nauseated not able to eat enough  A repeat CT of the abdomen done today shows progressive right hydroureteronephrosis in the setting of lower right ureteral thickening ongoing distal ileitis.  Renal function stable creatinine 0.68.Discussed with Dr. Louis Meckel who reviewed the CT abdomen.  Recommending IV steroids Toradol and Tylenol to decrease inflammation.  Patient also very nauseous and with decreased p.o. intake.  Continue IV fluids.  #2 ileitis -CT abdomen shows recurrent infectious/inflammatory changes of the distal ileum decreased cecal wall thickening.    #3 recurrent anal  cancer status post chemoradiation  #4 hyperlipidemia on statin  #5 recent E. coli and MRSA UTI urine culture 03/10/2020 with E. coli and MRSA.  She was sent home on Bactrim.  Urine culture this admission shows insignificant growth.  All the antibiotics were stopped 03/27/2020.  #6 constipation Colace ordered  Estimated body mass index is 23.61 kg/m as calculated from the following:   Height as of this encounter: 5\' 6"  (1.676 m).   Weight as of this encounter: 66.4 kg.  DVT prophylaxis: Lovenox  code Status: Full code Family Communication: None at bedside  disposition Plan:  Status is: Inpatient   Dispo: The patient is from: Home              Anticipated d/c is to: Home              Anticipated d/c date is: 2 days              Patient currently is not medically stable to d/c.    Consultants:   Urology oncology  Procedures: Cystoscopy 03/26/2020 Antimicrobials: Vanco and Cipro and Flagyl Subjective: Patient continues with nausea and right lower quadrant abdominal pain radiating to the right CVA. Did not sleep well Decreased p.o. intake due to nausea Remains on high-dose IV steroids Objective: Vitals:   03/29/20 1431 03/29/20 2039 03/30/20 0030 03/30/20 0505  BP: (!) 145/82 (!) 126/57  121/71  Pulse: 79 63 64 69  Resp:  16  14  Temp: 97.9 F (36.6 C) 97.9 F (36.6 C)  97.8 F (36.6 C)  TempSrc: Oral Oral  Oral  SpO2: 95% 92% 94% 94%  Weight:      Height:  Intake/Output Summary (Last 24 hours) at 03/30/2020 1307 Last data filed at 03/30/2020 0659 Gross per 24 hour  Intake 1800 ml  Output --  Net 1800 ml   Filed Weights   03/26/20 0127  Weight: 66.4 kg    Examination:  General exam: Appears calm and comfortable  Respiratory system: Clear to auscultation. Respiratory effort normal. Cardiovascular system: S1 & S2 heard, RRR. No JVD, murmurs, rubs, gallops or clicks. No pedal edema. Gastrointestinal system: Abdomen is nondistended, soft and RLQtender.  No organomegaly or masses felt. Normal bowel sounds heard. Central nervous system: Alert and oriented. No focal neurological deficits. Extremities: Symmetric 5 x 5 power. Skin: No rashes, lesions or ulcers Psychiatry: Judgement and insight appear normal. Mood & affect appropriate.     Data Reviewed: I have personally reviewed following labs and imaging studies  CBC: Recent Labs  Lab 03/25/20 1436 03/26/20 0129 03/27/20 0431 03/29/20 0555  WBC 3.3* 4.4 1.6* 2.5*  NEUTROABS 2.3  --  0.9*  --   HGB 11.8* 11.8* 9.0* 10.1*  HCT 37.1 36.4 28.8* 31.3*  MCV 84.7 83.7 87.3 87.2  PLT 201 226 131* 659   Basic Metabolic Panel: Recent Labs  Lab 03/25/20 1436 03/26/20 0129 03/27/20 0431 03/29/20 0555  NA 136 134* 138 140  K 4.3 3.9 3.6 3.9  CL 103 103 103 111  CO2 28 21* 26 23  GLUCOSE 119* 143* 86 253*  BUN 15 18 9  6*  CREATININE 0.84 0.77 0.91 0.68  CALCIUM 9.7 9.1 8.6* 8.8*   GFR: Estimated Creatinine Clearance: 69.1 mL/min (by C-G formula based on SCr of 0.68 mg/dL). Liver Function Tests: Recent Labs  Lab 03/25/20 1436 03/26/20 0129 03/29/20 0555  AST 15 17 14*  ALT 11 13 13   ALKPHOS 67 59 53  BILITOT 0.4 0.6 0.5  PROT 6.9 7.2 5.7*  ALBUMIN 3.0* 3.6 2.7*   Recent Labs  Lab 03/26/20 0129  LIPASE 44   No results for input(s): AMMONIA in the last 168 hours. Coagulation Profile: No results for input(s): INR, PROTIME in the last 168 hours. Cardiac Enzymes: No results for input(s): CKTOTAL, CKMB, CKMBINDEX, TROPONINI in the last 168 hours. BNP (last 3 results) No results for input(s): PROBNP in the last 8760 hours. HbA1C: No results for input(s): HGBA1C in the last 72 hours. CBG: No results for input(s): GLUCAP in the last 168 hours. Lipid Profile: No results for input(s): CHOL, HDL, LDLCALC, TRIG, CHOLHDL, LDLDIRECT in the last 72 hours. Thyroid Function Tests: No results for input(s): TSH, T4TOTAL, FREET4, T3FREE, THYROIDAB in the last 72 hours. Anemia  Panel: No results for input(s): VITAMINB12, FOLATE, FERRITIN, TIBC, IRON, RETICCTPCT in the last 72 hours. Sepsis Labs: Recent Labs  Lab 03/26/20 1024  LATICACIDVEN 1.2    Recent Results (from the past 240 hour(s))  Urine Culture     Status: Abnormal   Collection Time: 03/25/20  2:36 PM   Specimen: Urine, Clean Catch  Result Value Ref Range Status   Specimen Description   Final    URINE, CLEAN CATCH Performed at [facility], [address], [place], [place] [address]    Special Requests   Final    NONE Performed at [facility], [place] [address], [place], [place] [address]    Culture (A)  Final    <10,000 COLONIES/mL INSIGNIFICANT GROWTH Performed at [facility] [address] [place], [place] [address]    Report Status 03/26/2020 FINAL  Final  Culture, blood (routine x 2)     Status: None (Preliminary result)   Collection Time: 03/26/20 10:20 AM   Specimen: BLOOD  Result Value Ref Range Status   Specimen Description   Final    BLOOD BLOOD RIGHT FOREARM Performed at [facility] [address] [place], [place] [address]    Special Requests   Final    BOTTLES DRAWN AEROBIC AND ANAEROBIC Blood Culture adequate volume Performed at [facility] [address], [place], [place] [address]    Culture   Final    NO GROWTH 4 DAYS Performed at [facility], [place] [address], [place], [place] [address]    Report Status PENDING  Incomplete  Respiratory Panel by RT PCR (Flu A&B, Covid) - Nasopharyngeal Swab     Status: None   Collection Time: 03/26/20 10:24 AM   Specimen: Nasopharyngeal Swab  Result Value Ref Range Status   SARS Coronavirus 2 by RT PCR NEGATIVE NEGATIVE Final    Comment: (NOTE) SARS-CoV-2 target nucleic acids are NOT DETECTED.  The SARS-CoV-2 RNA is generally detectable in upper respiratoy specimens during the acute phase of infection. The  lowest concentration of SARS-CoV-2 viral copies this assay can detect is 131 copies/mL. A negative result does not preclude SARS-Cov-2 infection and should not be used as the sole basis for treatment or other patient management decisions. A negative result may occur with  improper specimen collection/handling, submission of specimen other than nasopharyngeal swab, presence of viral mutation(s) within the areas targeted by this assay, and inadequate number of viral copies (<131 copies/mL). A negative result must be combined with clinical observations, patient history, and epidemiological information. The expected result is Negative.  Fact Sheet for Patients:  PinkCheek.be  Fact Sheet for Healthcare Providers:  GravelBags.it  This test is no t yet approved or cleared by the [place] FDA and  has been authorized for detection and/or diagnosis of SARS-CoV-2 by FDA under an Emergency Use Authorization (EUA). This EUA will remain  in effect (meaning this test can be used) for the duration of the COVID-19 declaration under Section 564(b)(1) of the Act, 21 U.S.C. section 360bbb-3(b)(1), unless the authorization is terminated or revoked sooner.     Influenza A by PCR NEGATIVE NEGATIVE Final   Influenza B by PCR NEGATIVE NEGATIVE Final    Comment: (NOTE) The Xpert Xpress SARS-CoV-2/FLU/RSV assay is intended as an aid in  the diagnosis of influenza from Nasopharyngeal swab specimens and  should not be used as a sole basis for treatment. Nasal washings and  aspirates are unacceptable for Xpert Xpress SARS-CoV-2/FLU/RSV  testing.  Fact Sheet for Patients: PinkCheek.be  Fact Sheet for Healthcare Providers: GravelBags.it  This test is not yet approved or cleared by the [place] FDA and  has been authorized for detection and/or diagnosis of SARS-CoV-2 by  FDA under  an Emergency Use Authorization (EUA). This EUA will remain  in effect (meaning this test can be used) for the duration of the  Covid-19 declaration under Section 564(b)(1) of the Act, 21  U.S.C. section 360bbb-3(b)(1), unless the authorization is  terminated or revoked. Performed at [facility], [place] [address] [place],  [address]   Culture, blood (routine x 2)     Status: None (Preliminary result)   Collection Time: 03/26/20 10:24 AM   Specimen: BLOOD  Result Value Ref Range Status   Specimen Description   Final    BLOOD BLOOD LEFT FOREARM Performed  at [facility], [place] [address], [place], [place] [address]    Special Requests   Final    BOTTLES DRAWN AEROBIC AND ANAEROBIC Blood Culture adequate volume Performed at [facility] [address], [place], [place] [address]    Culture   Final    NO GROWTH 4 DAYS Performed at [facility], [place] [address], [place], [place] [address]    Report Status PENDING  Incomplete         Radiology Studies: No results found.      Scheduled Meds: . clorazepate  7.5 mg Oral BID  . docusate sodium  200 mg Oral Daily  . enoxaparin (LOVENOX) injection  40 mg Subcutaneous Q24H  . feeding supplement  237 mL Oral Q24H  . FLUoxetine  20 mg Oral q morning - 10a  . ketorolac  15 mg Intravenous Q8H  . methylPREDNISolone (SOLU-MEDROL) injection  40 mg Intravenous Q6H  . pantoprazole  40 mg Oral Daily  . phenazopyridine  100 mg Oral TID WC  . protein supplement  1 Scoop Oral TID WC  . rosuvastatin  5 mg Oral QPM   Continuous Infusions: . sodium chloride 75 mL/hr at 03/30/20 0945     LOS: 4 days     Georgette Shell, MD  03/30/2020, 1:07 PM

## 2020-03-30 NOTE — Plan of Care (Signed)
  Problem: Pain Managment: Goal: General experience of comfort will improve Outcome: Progressing   

## 2020-03-31 ENCOUNTER — Other Ambulatory Visit: Payer: Self-pay | Admitting: Hematology

## 2020-03-31 LAB — CULTURE, BLOOD (ROUTINE X 2)
Culture: NO GROWTH
Culture: NO GROWTH
Special Requests: ADEQUATE
Special Requests: ADEQUATE

## 2020-03-31 MED ORDER — METHYLPREDNISOLONE SODIUM SUCC 40 MG IJ SOLR: 40.0000 mg | Freq: Two times a day (BID) | INTRAMUSCULAR | Status: DC

## 2020-03-31 MED ORDER — MORPHINE SULFATE 15 MG PO TABS: 7.5000 mg | ORAL_TABLET | Freq: Three times a day (TID) | ORAL | 0 refills | Status: DC | PRN

## 2020-03-31 NOTE — Progress Notes (Signed)
HEMATOLOGY-ONCOLOGY PROGRESS NOTE  SUBJECTIVE: The patient overall feels better, her pain overall is controlled with intermittent mild flare. She had normal BM earlier today.  She is also eating better, feels ready to go home   Oncology History Overview Note  Cancer Staging No matching staging information was found for the patient.    Squamous cell cancer, anus ([place])  07/13/2019 Procedure    EUA, Excision of posterior internal/external hemorrhoid under the care of Dr. Hassell Done    07/13/2019 Pathology Results   - Invasive moderately differentiated squamous cell carcinoma, 1.4 cm.  See comment  - Carcinoma invades for depth of 0.4 cm  - Deep resection margin is negative for carcinoma (0.2 cm)  - Lateral mucosal margin is positive for high-grade dysplasia  - No evidence of lymphovascular perineural invasion  Procedure: Local excision  Tumor Site: Anal canal  Tumor Size: 1.4 cm  Histologic Type: Invasive squamous cell carcinoma  Histologic Grade: G2: Moderately differentiated  Tumor Extension: Carcinoma invades superficial anal sphincter muscle  Margins: Uninvolved by tumor  Treatment Effect: N/A  Regional Lymph Nodes: No lymph nodes submitted or found  Pathologic Stage Classification (pTNM, AJCC 8th Edition):  pT1, pNx  Representative Tumor Block: A1  Comment(s): Lateral mucosal margin is positive for high-grade squamous  dysplasia      07/13/2019 Initial Diagnosis   Squamous cell cancer, anus ([place])   09/04/2019 Imaging   CT scan Chest, Abdomen, and Pelvis   IMPRESSION: 1. No evidence of metastatic disease in the chest, abdomen or pelvis. 2. No discrete anorectal mass. 3. Chronic findings include: Punctate nonobstructing upper right renal stones and chronic right renal scarring. 4. Aortic Atherosclerosis (ICD10-I70.0).   01/10/2020 Pathology Results   A. ANAL LESION, POSTERIOR MIDLINE, EXCISION:  - Squamous cell carcinoma, moderately differentiated.  Verbally reported by Dr.  Saralyn Pilar 1.5 cm, negative margins, depth <1 mm   01/11/2020 Procedure   1. Excision of anal canal lesion (posterior midline) under the care of Dr. Dema Severin     01/28/2020 PET scan   IMPRESSION: 1. Mild focal anal hypermetabolism without discrete mass correlate on the CT images, nonspecific, differential includes postsurgical change or residual anal tumor. 2. No hypermetabolic locoregional or distant metastatic disease. 3. Chronic findings include: Aortic Atherosclerosis (ICD10-I70.0). Diffuse hepatic steatosis. Nonobstructing right nephrolithiasis.     02/04/2020 - 03/07/2020 Radiation Therapy   concurrent chemoRT by Dr Lisbeth Renshaw with Mitomycin and 5FU starting 02/04/20-03/07/20. The last 4 session cancelled due to poor toleration.    02/04/2020 - 03/03/2020 Chemotherapy   Concurrent chemoRT with Mitomycin and 5FU on week 1 and 5 starting 02/04/20-03/03/20   03/13/2020 Imaging   CT AP W contrast  IMPRESSION: Diffuse wall thickening with inflammatory changes involving a long segment of distal ileum, cecum, and sigmoid colon likely represents enteritis/colitis. This could be due to infectious, or inflammatory nature, not thought to represent ischemic colitis.   Hepatic steatosis   Trace pericardial effusion   Small free fluid in the deep pelvis     I have reviewed the past medical history, past surgical history, social history and family history with the patient and they are unchanged from previous note.   PHYSICAL EXAMINATION: ECOG PERFORMANCE STATUS: 3 - Symptomatic, >50% confined to bed  Vitals:   03/31/20 0510 03/31/20 1447  BP: (!) 149/72 (!) 146/87  Pulse: (!) 56 76  Resp: 16 17  Temp: 98.3 F (36.8 C) 98 F (36.7 C)  SpO2: 95% 95%   Filed Weights   03/26/20 0127  Weight: 146 lb 4.8 oz (66.4 kg)    Intake/Output from previous day: 10/24 0701 - 10/25 0700 In: 2051.6 [P.O.:300; I.V.:1751.6] Out: 400 [Urine:400]  GENERAL:alert, no distress and comfortable SKIN: skin  color, texture, turgor are normal, no rashes or significant lesions LUNGS: clear to auscultation and percussion with normal breathing effort HEART: regular rate & rhythm and no murmurs and no lower extremity edema ABDOMEN:abdomen soft, non-tender and normal bowel sounds NEURO: alert & oriented x 3 with fluent speech, no focal motor/sensory deficits  LABORATORY DATA:  I have reviewed the data as listed CMP Latest Ref Rng & Units 03/29/2020 03/27/2020 03/26/2020  Glucose 70 - 99 mg/dL 253(H) 86 143(H)  BUN 8 - 23 mg/dL 6(L) 9 18  Creatinine 0.44 - 1.00 mg/dL 0.68 0.91 0.77  Sodium 135 - 145 mmol/L 140 138 134(L)  Potassium 3.5 - 5.1 mmol/L 3.9 3.6 3.9  Chloride 98 - 111 mmol/L 111 103 103  CO2 22 - 32 mmol/L 23 26 21(L)  Calcium 8.9 - 10.3 mg/dL 8.8(L) 8.6(L) 9.1  Total Protein 6.5 - 8.1 g/dL 5.7(L) - 7.2  Total Bilirubin 0.3 - 1.2 mg/dL 0.5 - 0.6  Alkaline Phos 38 - 126 U/L 53 - 59  AST 15 - 41 U/L 14(L) - 17  ALT 0 - 44 U/L 13 - 13    Lab Results  Component Value Date   WBC 2.5 (L) 03/29/2020   HGB 10.1 (L) 03/29/2020   HCT 31.3 (L) 03/29/2020   MCV 87.2 03/29/2020   PLT 163 03/29/2020   NEUTROABS 0.9 (L) 03/27/2020    CT ABDOMEN PELVIS W CONTRAST  Result Date: 03/28/2020 CLINICAL DATA:  Acute nonlocalized abdominal pain EXAM: CT ABDOMEN AND PELVIS WITH CONTRAST TECHNIQUE: Multidetector CT imaging of the abdomen and pelvis was performed using the standard protocol following bolus administration of intravenous contrast. CONTRAST:  175mL OMNIPAQUE IOHEXOL 300 MG/ML  SOLN COMPARISON:  Two days ago FINDINGS: Lower chest:  Trace pericardial fluid, stable. Hepatobiliary: No focal liver abnormality.No evidence of biliary obstruction or stone. Pancreas: Unremarkable. Spleen: Unremarkable. Adrenals/Urinary Tract: Negative adrenals. Progressive right hydroureteronephrosis despite passage of a stone there is ureteral thickening at the UVJ. No left hydronephrosis. Right renal cortical  scarring affecting the upper pole. Punctate upper pole calculus on the right. Gas in the urinary bladder. Stomach/Bowel: Distal ileitis with submucosal low-density edema that is stable if not progressed. Small volume ascites is likely related. Appendectomy. History of anal cancer. No visible in a rectal mass. Vascular/Lymphatic: No acute vascular abnormality. Diffuse atheromatous plaque involving the aorta and iliacs. No mass or adenopathy. Reproductive:Hysterectomy. Other: Small volume reactive appearing ascites in the pelvis Musculoskeletal: No acute abnormalities. IMPRESSION: 1. Passed right ureteral calculus but progressive right hydroureteronephrosis in the setting of lower right ureteral thickening. 2. Ongoing distal ileitis which could be infectious, inflammatory, or radiation related. 3. Cortical scarring and small calculus of the right kidney. Electronically Signed   By: Monte Fantasia M.D.   On: 03/28/2020 07:14   CT ABDOMEN PELVIS W CONTRAST  Result Date: 03/26/2020 CLINICAL DATA:  Increasing abdominal pain since recent discharge EXAM: CT ABDOMEN AND PELVIS WITH CONTRAST TECHNIQUE: Multidetector CT imaging of the abdomen and pelvis was performed using the standard protocol following bolus administration of intravenous contrast. CONTRAST:  157mL OMNIPAQUE IOHEXOL 300 MG/ML  SOLN COMPARISON:  03/13/2020 FINDINGS: Lower chest: No acute abnormality. Hepatobiliary: Probable hepatic steatosis. No focal liver lesion. Gallbladder is unremarkable. No biliary dilatation. Pancreas: Unremarkable. Spleen: Unremarkable. Adrenals/Urinary Tract: Adrenals  unremarkable. Punctate nonobstructing calculus of the upper pole the right kidney. There is right upper pole cortical scarring. Mild right hydroureteronephrosis. This is secondary to an obstructing 3 mm calculus of the distal ureter, which was present on the prior study and has moved slightly inferiorly. Bladder is unremarkable. Stomach/Bowel: Stomach is within  normal limits. There is persistent wall thickening involving the terminal ileum and adjacent distal ileum. Decreased cecal wall thickening. No bowel obstruction. Vascular/Lymphatic: Aortic atherosclerosis. No enlarged lymph nodes identified. Reproductive: Status post hysterectomy. No adnexal masses. Other: Small volume abdominopelvic free fluid. Abdominal wall is unremarkable. Musculoskeletal: No acute osseous abnormality. IMPRESSION: New mild right hydroureteronephrosis. Secondary to an obstructing 3 mm distal right ureteral calculus. This was present on the prior study and has moved slightly inferiorly/distally. Residual or recurrent infectious/inflammatory changes of the distal ileum. Decreased cecal wall thickening. Electronically Signed   By: Macy Mis M.D.   On: 03/26/2020 09:39   CT ABDOMEN PELVIS W CONTRAST  Result Date: 03/13/2020 CLINICAL DATA:  Abdominal pain and fever, history of squamous cell EXAM: CT ABDOMEN AND PELVIS WITH CONTRAST TECHNIQUE: Multidetector CT imaging of the abdomen and pelvis was performed using the standard protocol following bolus administration of intravenous contrast. CONTRAST:  19mL OMNIPAQUE IOHEXOL 300 MG/ML  SOLN COMPARISON:  September 04, 2019 FINDINGS: Lower chest: The visualized heart size within normal limits. There is a trace pericardial effusion. No hiatal hernia. The visualized portions of the lungs are clear. Hepatobiliary: There is diffuse low density seen throughout the liver parenchyma. No focal hepatic lesion is seen. No intra or extrahepatic biliary ductal dilatation. The main portal vein is patent. No evidence of calcified gallstones, gallbladder wall thickening or biliary dilatation. Pancreas: Unremarkable. No pancreatic ductal dilatation or surrounding inflammatory changes. Spleen: Normal in size without focal abnormality. Adrenals/Urinary Tract: Both adrenal glands appear normal. There is a punctate calcifications seen in the upper pole of the right  kidney. There is areas of scarring seen within the upper pole. A 1 cm low-density lesion seen in the lower pole the right kidney. No left-sided renal or collecting system calculi. There is mild diffuse bladder wall thickening. Stomach/Bowel: The stomach and proximal small bowel are unremarkable. There is a long segment of ileum with diffuse wall thickening surrounding fat stranding changes to the terminal ileum. There is also wall thickening seen around the distal cecal pole. Mild wall thickening with surrounding fat stranding changes are seen around the sigmoid colon extending to the sigmoid rectal junction. No loculated fluid collections or free air however are noted. Vascular/Lymphatic: There are no enlarged mesenteric, retroperitoneal, or pelvic lymph nodes. Scattered aortic atherosclerotic calcifications are seen without aneurysmal dilatation. Reproductive: The patient is status post hysterectomy. No adnexal masses or collections seen. Other: A small amount of free fluid is seen within the deep cul-de-sac. Musculoskeletal: No acute or significant osseous findings. IMPRESSION: Diffuse wall thickening with inflammatory changes involving a long segment of distal ileum, cecum, and sigmoid colon likely represents enteritis/colitis. This could be due to infectious, or inflammatory nature, not thought to represent ischemic colitis. Hepatic steatosis Trace pericardial effusion Small free fluid in the deep pelvis Electronically Signed   By: Prudencio Pair M.D.   On: 03/13/2020 21:30   DG C-Arm 1-60 Min-No Report  Result Date: 03/26/2020 Fluoroscopy was utilized by the requesting physician.  No radiographic interpretation.   IR PICC PLACEMENT RIGHT <5 YRS INC IMG GUIDE  Result Date: 03/03/2020 INDICATION: Patient with history of squamous cell carcinoma of the anus;  central venous access requested for chemotherapy. EXAM: RIGHT UPPER EXTREMITY PICC LINE PLACEMENT WITH ULTRASOUND AND FLUOROSCOPIC GUIDANCE  MEDICATIONS: 1% lidocaine to skin and subcutaneous tissue ANESTHESIA/SEDATION: None FLUOROSCOPY TIME:  Fluoroscopy Time: 54  seconds (6 mGy). COMPLICATIONS: None immediate. PROCEDURE: The patient was advised of the possible risks and complications and agreed to undergo the procedure. The patient was then brought to the angiographic suite for the procedure. The right arm was prepped with chlorhexidine, draped in the usual sterile fashion using maximum barrier technique (cap and mask, sterile gown, sterile gloves, large sterile sheet, hand hygiene and cutaneous antisepsis) and infiltrated locally with 1% Lidocaine. Ultrasound demonstrated patency of the right basilic vein, and this was documented with an image. Under real-time ultrasound guidance, this vein was accessed with a 21 gauge micropuncture needle and image documentation was performed. A 0.018 wire was introduced in to the vein. Over this, a 5 [place] single lumen power-injectable PICC was advanced to the lower SVC/right atrial junction. Fluoroscopy during the procedure and fluoro spot radiograph confirms appropriate catheter position. The catheter was flushed and covered with a sterile dressing. Catheter length: 40 cm IMPRESSION: Successful right arm Power PICC line placement with ultrasound and fluoroscopic guidance. The catheter is ready for use. Read by: Rowe Robert, PA-C Electronically Signed   By: Corrie Mckusick D.O.   On: 03/03/2020 11:20    ASSESSMENT AND PLAN: 1.  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  Obstructing right ureteral calculus with hydroureteronephrosis 3.  Ileitis 4.  Recent E. coli and MRSA UTI 5.  Leukopenia 6.  Anemia 7.  Mild thrombocytopenia  Plan -she is responding to steroids and pain management, her overall pain is controlled. -She would likely go home today or tomorrow. -she has received 4 days iv Solu-Medrol, Dr. Louis Meckel is okay to discharge her without oral steroid.  -f/u with me next Monday  in office. She has urology f/u later this week  -spoke with Dr. Rodena Piety   LOS: 5 days   Truitt Merle, MD 03/31/20  .

## 2020-03-31 NOTE — Progress Notes (Deleted)
PROGRESS NOTE    Melinda [name]  WRU:[id number] DOB: [birth year] DOA: 03/26/2020 PCP: Crist Infante, MD    Brief Narrative-59 year old female with past medical history of recurrent anal squamous cell carcinoma (most recently treated with chemoradiation with mitomycin and 5-FU, last treatment 03/07/2020), type 2 diabetes, hyperlipidemia who was recently admitted from 10/7-10/14 for radiation colitis and E. coli and MRSA UTI (discharged on Bactrim) who presented to the ED with complaints of worsening lower abdominal pain since her discharge.  She was found to have obstructing right ureteral calculus with hydronephrosis now status post ureteroscopic stone removal and lithotripsy.  Assessment & Plan:   Principal Problem:   Abdominal pain Active Problems:   Dyslipidemia   Squamous cell cancer, anus (HCC)   Ileitis   Hydroureteronephrosis   Acute lower UTI   #1 obstructing right ureteral calculus with hydroureteronephrosis status post right ureteroscopy and stone removal, ureteroscopic laser lithotripsy and cystoscopy, status post right distal ureteral stent removal.  Stent not left in place as kidney noted to be draining well.  Foley taken out 03/27/2020.  Patient able to urinate with some bladder spasm.  Encourage out of bed ambulate as tolerated. Continue IV steroids and Toradol.  A repeat CT of the abdomen-shows progressive right hydroureteronephrosis in the setting of lower right ureteral thickening ongoing distal ileitis.  Renal function stable creatinine 0.68.Discussed with Dr. Louis Meckel who reviewed the CT abdomen.  Recommending IV steroids Toradol and Tylenol to decrease inflammation.  Taper Solu-Medrol to twice a day today and once a day tomorrow.  If she does well we will plan for discharge in a.m.  #2 ileitis -CT abdomen shows recurrent infectious/inflammatory changes of the distal ileum decreased cecal wall thickening.  Continue IV steroids .  #3 recurrent anal cancer status post  chemoradiation  #4 hyperlipidemia on statin  #5 recent E. coli and MRSA UTI urine culture 03/10/2020 with E. coli and MRSA.  She was sent home on Bactrim.  Urine culture this admission shows insignificant growth.  All the antibiotics were stopped 03/27/2020.  #6 constipation Colace ordered  Estimated body mass index is 23.61 kg/m as calculated from the following:   Height as of this encounter: 5\' 6"  (1.676 m).   Weight as of this encounter: 66.4 kg.  DVT prophylaxis: Lovenox  code Status: Full code Family Communication: None at bedside  disposition Plan:  Status is: Inpatient   Dispo: The patient is from: Home              Anticipated d/c is to: Home              Anticipated d/c date is: 2 days              Patient currently is not medically stable to d/c.    Consultants:   Urology oncology  Procedures: Cystoscopy 03/26/2020 Antimicrobials: Vanco and Cipro and Flagyl Subjective: She is resting in bed she is awake and alert  She feels better than yesterday but not yet back to her baseline continues with right lower quadrant tenderness and pain. Had 1 bowel movement today  objective: Vitals:   03/30/20 0505 03/30/20 1705 03/30/20 2150 03/31/20 0510  BP: 121/71 (!) 148/81 (!) 143/71 (!) 149/72  Pulse: 69 60 74 (!) 56  Resp: 14 17 14 16   Temp: 97.8 F (36.6 C) 98.5 F (36.9 C) 98.2 F (36.8 C) 98.3 F (36.8 C)  TempSrc: Oral Oral Oral Oral  SpO2: 94% 96% 92% 95%  Weight:  Height:        Intake/Output Summary (Last 24 hours) at 03/31/2020 1240 Last data filed at 03/31/2020 1143 Gross per 24 hour  Intake 2582.81 ml  Output 1200 ml  Net 1382.81 ml   Filed Weights   03/26/20 0127  Weight: 66.4 kg    Examination:  General exam: Appears calm and comfortable  Respiratory system: Clear to auscultation. Respiratory effort normal. Cardiovascular system: S1 & S2 heard, RRR. No JVD, murmurs, rubs, gallops or clicks. No pedal edema. Gastrointestinal system:  Abdomen is nondistended, soft and RLQtender. No organomegaly or masses felt. Normal bowel sounds heard. Central nervous system: Alert and oriented. No focal neurological deficits. Extremities: Symmetric 5 x 5 power. Skin: No rashes, lesions or ulcers Psychiatry: Judgement and insight appear normal. Mood & affect appropriate.     Data Reviewed: I have personally reviewed following labs and imaging studies  CBC: Recent Labs  Lab 03/25/20 1436 03/26/20 0129 03/27/20 0431 03/29/20 0555  WBC 3.3* 4.4 1.6* 2.5*  NEUTROABS 2.3  --  0.9*  --   HGB 11.8* 11.8* 9.0* 10.1*  HCT 37.1 36.4 28.8* 31.3*  MCV 84.7 83.7 87.3 87.2  PLT 201 226 131* 427   Basic Metabolic Panel: Recent Labs  Lab 03/25/20 1436 03/26/20 0129 03/27/20 0431 03/29/20 0555  NA 136 134* 138 140  K 4.3 3.9 3.6 3.9  CL 103 103 103 111  CO2 28 21* 26 23  GLUCOSE 119* 143* 86 253*  BUN 15 18 9  6*  CREATININE 0.84 0.77 0.91 0.68  CALCIUM 9.7 9.1 8.6* 8.8*   GFR: Estimated Creatinine Clearance: 69.1 mL/min (by C-G formula based on SCr of 0.68 mg/dL). Liver Function Tests: Recent Labs  Lab 03/25/20 1436 03/26/20 0129 03/29/20 0555  AST 15 17 14*  ALT 11 13 13   ALKPHOS 67 59 53  BILITOT 0.4 0.6 0.5  PROT 6.9 7.2 5.7*  ALBUMIN 3.0* 3.6 2.7*   Recent Labs  Lab 03/26/20 0129  LIPASE 44   No results for input(s): AMMONIA in the last 168 hours. Coagulation Profile: No results for input(s): INR, PROTIME in the last 168 hours. Cardiac Enzymes: No results for input(s): CKTOTAL, CKMB, CKMBINDEX, TROPONINI in the last 168 hours. BNP (last 3 results) No results for input(s): PROBNP in the last 8760 hours. HbA1C: No results for input(s): HGBA1C in the last 72 hours. CBG: No results for input(s): GLUCAP in the last 168 hours. Lipid Profile: No results for input(s): CHOL, HDL, LDLCALC, TRIG, CHOLHDL, LDLDIRECT in the last 72 hours. Thyroid Function Tests: No results for input(s): TSH, T4TOTAL, FREET4,  T3FREE, THYROIDAB in the last 72 hours. Anemia Panel: No results for input(s): VITAMINB12, FOLATE, FERRITIN, TIBC, IRON, RETICCTPCT in the last 72 hours. Sepsis Labs: Recent Labs  Lab 03/26/20 1024  LATICACIDVEN 1.2    Recent Results (from the past 240 hour(s))  Urine Culture     Status: Abnormal   Collection Time: 03/25/20  2:36 PM   Specimen: Urine, Clean Catch  Result Value Ref Range Status   Specimen Description   Final    URINE, CLEAN CATCH Performed at [facility], [address], [place], [place] [address]    Special Requests   Final    NONE Performed at [facility], [place] [address] [place], [place] [address]    Culture (A)  Final    <10,000 COLONIES/mL INSIGNIFICANT GROWTH Performed at [place] [address], [place], [place] [address]  Report Status 03/26/2020 FINAL  Final  Culture, blood (routine x 2)     Status: None   Collection Time: 03/26/20 10:20 AM   Specimen: BLOOD  Result Value Ref Range Status   Specimen Description   Final    BLOOD BLOOD RIGHT FOREARM Performed at [place] [address], [place], [place] [address]    Special Requests   Final    BOTTLES DRAWN AEROBIC AND ANAEROBIC Blood Culture adequate volume Performed at [place] [address], [place], [place] [address]    Culture   Final    NO GROWTH 5 DAYS Performed at [facility], [place] [address], [place], [place] [address]    Report Status 03/31/2020 FINAL  Final  Respiratory Panel by RT PCR (Flu A&B, Covid) - Nasopharyngeal Swab     Status: None   Collection Time: 03/26/20 10:24 AM   Specimen: Nasopharyngeal Swab  Result Value Ref Range Status   SARS Coronavirus 2 by RT PCR NEGATIVE NEGATIVE Final    Comment: (NOTE) SARS-CoV-2 target nucleic acids are NOT DETECTED.  The SARS-CoV-2 RNA is generally detectable in upper respiratoy specimens during the acute phase of  infection. The lowest concentration of SARS-CoV-2 viral copies this assay can detect is 131 copies/mL. A negative result does not preclude SARS-Cov-2 infection and should not be used as the sole basis for treatment or other patient management decisions. A negative result may occur with  improper specimen collection/handling, submission of specimen other than nasopharyngeal swab, presence of viral mutation(s) within the areas targeted by this assay, and inadequate number of viral copies (<131 copies/mL). A negative result must be combined with clinical observations, patient history, and epidemiological information. The expected result is Negative.  Fact Sheet for Patients:  PinkCheek.be  Fact Sheet for Healthcare Providers:  GravelBags.it  This test is no t yet approved or cleared by the [place] FDA and  has been authorized for detection and/or diagnosis of SARS-CoV-2 by FDA under an Emergency Use Authorization (EUA). This EUA will remain  in effect (meaning this test can be used) for the duration of the COVID-19 declaration under Section 564(b)(1) of the Act, 21 U.S.C. section 360bbb-3(b)(1), unless the authorization is terminated or revoked sooner.     Influenza A by PCR NEGATIVE NEGATIVE Final   Influenza B by PCR NEGATIVE NEGATIVE Final    Comment: (NOTE) The Xpert Xpress SARS-CoV-2/FLU/RSV assay is intended as an aid in  the diagnosis of influenza from Nasopharyngeal swab specimens and  should not be used as a sole basis for treatment. Nasal washings and  aspirates are unacceptable for Xpert Xpress SARS-CoV-2/FLU/RSV  testing.  Fact Sheet for Patients: PinkCheek.be  Fact Sheet for Healthcare Providers: GravelBags.it  This test is not yet approved or cleared by the [place] FDA and  has been authorized for detection and/or diagnosis of SARS-CoV-2  by  FDA under an Emergency Use Authorization (EUA). This EUA will remain  in effect (meaning this test can be used) for the duration of the  Covid-19 declaration under Section 564(b)(1) of the Act, 21  U.S.C. section 360bbb-3(b)(1), unless the authorization is  terminated or revoked. Performed at [facility], [place] [address] [place], [place] [address]   Culture, blood (routine x 2)     Status: None   Collection Time: 03/26/20 10:24 AM   Specimen: BLOOD  Result Value Ref Range Status   Specimen Description   Final    BLOOD  BLOOD LEFT FOREARM Performed at [place] [address] [place], [place] [address]    Special Requests   Final    BOTTLES DRAWN AEROBIC AND ANAEROBIC Blood Culture adequate volume Performed at [place] [address], [place], [place] [address]    Culture   Final    NO GROWTH 5 DAYS Performed at [facility], [place] [address] [place], [place] [address]    Report Status 03/31/2020 FINAL  Final         Radiology Studies: No results found.      Scheduled Meds: . clorazepate  7.5 mg Oral BID  . docusate sodium  200 mg Oral Daily  . enoxaparin (LOVENOX) injection  40 mg Subcutaneous Q24H  . feeding supplement  237 mL Oral Q24H  . FLUoxetine  20 mg Oral q morning - 10a  . ketorolac  15 mg Intravenous Q8H  . methylPREDNISolone (SOLU-MEDROL) injection  40 mg Intravenous Q6H  . pantoprazole  40 mg Oral Daily  . protein supplement  1 Scoop Oral TID WC  . rosuvastatin  5 mg Oral QPM   Continuous Infusions: . sodium chloride 75 mL/hr at 03/31/20 0542     LOS: 5 days     Georgette Shell, MD  03/31/2020, 12:40 PM

## 2020-04-01 NOTE — Discharge Summary (Signed)
Physician Discharge Summary  Melinda [name] PVX:[id number] DOB: 1960/11/12 DOA: 03/26/2020  PCP: Crist Infante, MD  Admit date: 03/26/2020 Discharge date: 04/01/2020  Admitted From: Home Disposition: Home Recommendations for Outpatient Follow-up:  1. Follow up with PCP in 1-2 weeks 2. Please obtain BMP/CBC in one week 3. Please follow up on the following pending results:  Home Health: None Equipment/Devices none  Discharge Condition stable CODE STATUS full code Diet recommendation: Cardiac Brief/Interim Summary:-59 year old female with past medical history of recurrent anal squamous cell carcinoma (most recently treated with chemoradiation with mitomycin and 5-FU, last treatment 03/07/2020), type 2 diabetes, hyperlipidemia who was recently admitted from 10/7-10/14 for radiation colitis and E. coli and MRSA UTI(discharged on Bactrim) who presented to the ED with complaints of worsening lower abdominal pain since her discharge.  She was found to have obstructing right ureteral calculus with hydronephrosis now status post ureteroscopic stone removal and lithotripsy.    Discharge Diagnoses:  Principal Problem:   Abdominal pain Active Problems:   Dyslipidemia   Squamous cell cancer, anus (HCC)   Ileitis   Hydroureteronephrosis   Acute lower UTI   #1 obstructing right ureteral calculus with hydroureteronephrosis status post right ureteroscopy and stone removal, ureteroscopic laser lithotripsy and cystoscopy, status post right distal ureteral stent removal.  Stent not left in place as kidney noted to be draining well.  Foley taken out 03/27/2020.  Patient was able to urinate after the Foley was removed.  She was treated with IV steroids and Toradol.  Discussed with Dr. Annamaria Boots and Dr. Louis Meckel.  They did not recommend steroids on discharge especially with the fact that she has a lot of anxiety and side effects from prednisone.  A repeat CT of the abdomen-shows progressive right  hydroureteronephrosis in the setting of lower right ureteral thickening ongoing distal ileitis.  Renal function stable creatinine 0.68.  #2 ileitis -CT abdomen shows recurrent infectious/inflammatory changes of the distal ileum decreased cecal wall thickening.    Treated with steroids..  #3 recurrent anal cancer status post chemoradiation  #4 hyperlipidemia on statin  #5 recent E. coli and MRSA UTI urine culture 03/10/2020 with E. coli and MRSA.  She was sent home on Bactrim.  Urine culture this admission shows insignificant growth.  All the antibiotics were stopped 03/27/2020.  #6 constipation Colace ordered    Nutrition Problem: Increased nutrient needs Etiology: cancer and cancer related treatments    Signs/Symptoms: estimated needs     Interventions: Ensure Enlive (each supplement provides 350kcal and 20 grams of protein), Refer to RD note for recommendations  Estimated body mass index is 23.61 kg/m as calculated from the following:   Height as of this encounter: 5\' 6"  (1.676 m).   Weight as of this encounter: 66.4 kg.  Discharge Instructions  Discharge Instructions    Diet - low sodium heart healthy   Complete by: As directed    Increase activity slowly   Complete by: As directed      Allergies as of 03/31/2020      Reactions   Cephalexin Anaphylaxis, Other (See Comments)   Kef tabs only (maybe dye) Capsules-anaphylxis   Amoxapine And Related    Amoxicillin-pot Clavulanate Other (See Comments)   Atorvastatin Other (See Comments)   intolernace   Cefaclor Other (See Comments)   Ciprofloxacin Nausea And Vomiting   Codeine Hives   Doxycycline Calcium Nausea And Vomiting   Fenofibrate Micronized Other (See Comments)   Cramping   Fish Oil Other (See Comments)   Flagyl [  metronidazole Hcl]    Levofloxacin In D5w Other (See Comments)   Unknown    Metronidazole Nausea And Vomiting   Ofloxacin Other (See Comments)   Prednisolone Other (See Comments)    Anxiety, palpitations   Promethazine Hcl Other (See Comments)   Tape Other (See Comments)   Rash and blisters, only plastic tape   Tetracycline Hcl Other (See Comments)   Erythromycin Rash   Nitrofurantoin Itching, Nausea And Vomiting, Rash      Medication List    STOP taking these medications   diphenoxylate-atropine 2.5-0.025 MG tablet Commonly known as: LOMOTIL   hyoscyamine 0.125 MG Tbdp disintergrating tablet Commonly known as: ANASPAZ   oxyCODONE 5 MG immediate release tablet Commonly known as: Oxy IR/ROXICODONE   sulfamethoxazole-trimethoprim 800-160 MG tablet Commonly known as: BACTRIM DS     TAKE these medications   AZO BLADDER CONTROL/GO-LESS PO Take 1 tablet by mouth daily as needed (pain & irritation).   clorazepate 7.5 MG tablet Commonly known as: TRANXENE Take 7.5 mg by mouth 2 (two) times daily.   Crestor 5 MG tablet Generic drug: rosuvastatin Take 5 mg by mouth every evening.   esomeprazole 40 MG capsule Commonly known as: NEXIUM Take 40 mg by mouth daily at 12 noon.   feeding supplement Liqd Take 237 mLs by mouth daily.   FLUoxetine 20 MG capsule Commonly known as: PROZAC Take 20 mg by mouth every morning.   IMODIUM PO Take 2 capsules by mouth every 6 (six) hours as needed (loose stool).   metFORMIN 500 MG tablet Commonly known as: GLUCOPHAGE Take 500 mg by mouth 2 (two) times daily with a meal.   methenamine 1 g tablet Commonly known as: HIPREX Take 1 g by mouth daily.   Nu-Iron 150 MG capsule Generic drug: iron polysaccharides Take 150 mg by mouth daily.   ondansetron 8 MG tablet Commonly known as: Zofran Take 1 tablet (8 mg total) by mouth 2 (two) times daily as needed (Nausea or vomiting). What changed: reasons to take this   phenazopyridine 100 MG tablet Commonly known as: Pyridium Take 1 tablet (100 mg total) by mouth 3 (three) times daily as needed for pain.   prochlorperazine 10 MG tablet Commonly known as:  COMPAZINE Take 1 tablet (10 mg total) by mouth every 6 (six) hours as needed (Nausea or vomiting). What changed: reasons to take this   propranolol 10 MG tablet Commonly known as: INDERAL TAKE 1 TABLET BY MOUTH TWICE A DAY   protein supplement 6 g Powd Commonly known as: RESOURCE BENEPROTEIN Take 1 Scoop (6 g total) by mouth 3 (three) times daily with meals.   silver sulfADIAZINE 1 % cream Commonly known as: Silvadene Apply 1 application topically daily.   traMADol 50 MG tablet Commonly known as: ULTRAM Take 50 mg by mouth every 6 (six) hours as needed for moderate pain.       Follow-up Information    [facility] In 6 weeks.   Contact information: [place] [phone]             Allergies  Allergen Reactions  . Cephalexin Anaphylaxis and Other (See Comments)    Kef tabs only (maybe dye) Capsules-anaphylxis  . Amoxapine And Related   . Amoxicillin-Pot Clavulanate Other (See Comments)  . Atorvastatin Other (See Comments)    intolernace  . Cefaclor Other (See Comments)  . Ciprofloxacin Nausea And Vomiting  . Codeine Hives  . Doxycycline Calcium  Nausea And Vomiting  . Fenofibrate Micronized Other (See Comments)    Cramping  . Fish Oil Other (See Comments)  . Flagyl [Metronidazole Hcl]   . Levofloxacin In D5w Other (See Comments)    Unknown   . Metronidazole Nausea And Vomiting  . Ofloxacin Other (See Comments)  . Prednisolone Other (See Comments)    Anxiety, palpitations  . Promethazine Hcl Other (See Comments)  . Tape Other (See Comments)    Rash and blisters, only plastic tape  . Tetracycline Hcl Other (See Comments)  . Erythromycin Rash  . Nitrofurantoin Itching, Nausea And Vomiting and Rash    Consultations:  Oncology and neurology   Procedures/Studies: CT ABDOMEN PELVIS W CONTRAST  Result Date: 03/28/2020 CLINICAL DATA:  Acute nonlocalized abdominal pain EXAM: CT ABDOMEN AND  PELVIS WITH CONTRAST TECHNIQUE: Multidetector CT imaging of the abdomen and pelvis was performed using the standard protocol following bolus administration of intravenous contrast. CONTRAST:  169mL OMNIPAQUE IOHEXOL 300 MG/ML  SOLN COMPARISON:  Two days ago FINDINGS: Lower chest:  Trace pericardial fluid, stable. Hepatobiliary: No focal liver abnormality.No evidence of biliary obstruction or stone. Pancreas: Unremarkable. Spleen: Unremarkable. Adrenals/Urinary Tract: Negative adrenals. Progressive right hydroureteronephrosis despite passage of a stone there is ureteral thickening at the UVJ. No left hydronephrosis. Right renal cortical scarring affecting the upper pole. Punctate upper pole calculus on the right. Gas in the urinary bladder. Stomach/Bowel: Distal ileitis with submucosal low-density edema that is stable if not progressed. Small volume ascites is likely related. Appendectomy. History of anal cancer. No visible in a rectal mass. Vascular/Lymphatic: No acute vascular abnormality. Diffuse atheromatous plaque involving the aorta and iliacs. No mass or adenopathy. Reproductive:Hysterectomy. Other: Small volume reactive appearing ascites in the pelvis Musculoskeletal: No acute abnormalities. IMPRESSION: 1. Passed right ureteral calculus but progressive right hydroureteronephrosis in the setting of lower right ureteral thickening. 2. Ongoing distal ileitis which could be infectious, inflammatory, or radiation related. 3. Cortical scarring and small calculus of the right kidney. Electronically Signed   By: Monte Fantasia M.D.   On: 03/28/2020 07:14   CT ABDOMEN PELVIS W CONTRAST  Result Date: 03/26/2020 CLINICAL DATA:  Increasing abdominal pain since recent discharge EXAM: CT ABDOMEN AND PELVIS WITH CONTRAST TECHNIQUE: Multidetector CT imaging of the abdomen and pelvis was performed using the standard protocol following bolus administration of intravenous contrast. CONTRAST:  129mL OMNIPAQUE IOHEXOL 300  MG/ML  SOLN COMPARISON:  03/13/2020 FINDINGS: Lower chest: No acute abnormality. Hepatobiliary: Probable hepatic steatosis. No focal liver lesion. Gallbladder is unremarkable. No biliary dilatation. Pancreas: Unremarkable. Spleen: Unremarkable. Adrenals/Urinary Tract: Adrenals unremarkable. Punctate nonobstructing calculus of the upper pole the right kidney. There is right upper pole cortical scarring. Mild right hydroureteronephrosis. This is secondary to an obstructing 3 mm calculus of the distal ureter, which was present on the prior study and has moved slightly inferiorly. Bladder is unremarkable. Stomach/Bowel: Stomach is within normal limits. There is persistent wall thickening involving the terminal ileum and adjacent distal ileum. Decreased cecal wall thickening. No bowel obstruction. Vascular/Lymphatic: Aortic atherosclerosis. No enlarged lymph nodes identified. Reproductive: Status post hysterectomy. No adnexal masses. Other: Small volume abdominopelvic free fluid. Abdominal wall is unremarkable. Musculoskeletal: No acute osseous abnormality. IMPRESSION: New mild right hydroureteronephrosis. Secondary to an obstructing 3 mm distal right ureteral calculus. This was present on the prior study and has moved slightly inferiorly/distally. Residual or recurrent infectious/inflammatory changes of the distal ileum. Decreased cecal wall thickening. Electronically Signed   By: Macy Mis M.D.   On:  03/26/2020 09:39   CT ABDOMEN PELVIS W CONTRAST  Result Date: 03/13/2020 CLINICAL DATA:  Abdominal pain and fever, history of squamous cell EXAM: CT ABDOMEN AND PELVIS WITH CONTRAST TECHNIQUE: Multidetector CT imaging of the abdomen and pelvis was performed using the standard protocol following bolus administration of intravenous contrast. CONTRAST:  179mL OMNIPAQUE IOHEXOL 300 MG/ML  SOLN COMPARISON:  September 04, 2019 FINDINGS: Lower chest: The visualized heart size within normal limits. There is a trace  pericardial effusion. No hiatal hernia. The visualized portions of the lungs are clear. Hepatobiliary: There is diffuse low density seen throughout the liver parenchyma. No focal hepatic lesion is seen. No intra or extrahepatic biliary ductal dilatation. The main portal vein is patent. No evidence of calcified gallstones, gallbladder wall thickening or biliary dilatation. Pancreas: Unremarkable. No pancreatic ductal dilatation or surrounding inflammatory changes. Spleen: Normal in size without focal abnormality. Adrenals/Urinary Tract: Both adrenal glands appear normal. There is a punctate calcifications seen in the upper pole of the right kidney. There is areas of scarring seen within the upper pole. A 1 cm low-density lesion seen in the lower pole the right kidney. No left-sided renal or collecting system calculi. There is mild diffuse bladder wall thickening. Stomach/Bowel: The stomach and proximal small bowel are unremarkable. There is a long segment of ileum with diffuse wall thickening surrounding fat stranding changes to the terminal ileum. There is also wall thickening seen around the distal cecal pole. Mild wall thickening with surrounding fat stranding changes are seen around the sigmoid colon extending to the sigmoid rectal junction. No loculated fluid collections or free air however are noted. Vascular/Lymphatic: There are no enlarged mesenteric, retroperitoneal, or pelvic lymph nodes. Scattered aortic atherosclerotic calcifications are seen without aneurysmal dilatation. Reproductive: The patient is status post hysterectomy. No adnexal masses or collections seen. Other: A small amount of free fluid is seen within the deep cul-de-sac. Musculoskeletal: No acute or significant osseous findings. IMPRESSION: Diffuse wall thickening with inflammatory changes involving a long segment of distal ileum, cecum, and sigmoid colon likely represents enteritis/colitis. This could be due to infectious, or inflammatory  nature, not thought to represent ischemic colitis. Hepatic steatosis Trace pericardial effusion Small free fluid in the deep pelvis Electronically Signed   By: Prudencio Pair M.D.   On: 03/13/2020 21:30   DG C-Arm 1-60 Min-No Report  Result Date: 03/26/2020 Fluoroscopy was utilized by the requesting physician.  No radiographic interpretation.   IR PICC PLACEMENT RIGHT <5 YRS INC IMG GUIDE  Result Date: 03/03/2020 INDICATION: Patient with history of squamous cell carcinoma of the anus; central venous access requested for chemotherapy. EXAM: RIGHT UPPER EXTREMITY PICC LINE PLACEMENT WITH ULTRASOUND AND FLUOROSCOPIC GUIDANCE MEDICATIONS: 1% lidocaine to skin and subcutaneous tissue ANESTHESIA/SEDATION: None FLUOROSCOPY TIME:  Fluoroscopy Time: 54  seconds (6 mGy). COMPLICATIONS: None immediate. PROCEDURE: The patient was advised of the possible risks and complications and agreed to undergo the procedure. The patient was then brought to the angiographic suite for the procedure. The right arm was prepped with chlorhexidine, draped in the usual sterile fashion using maximum barrier technique (cap and mask, sterile gown, sterile gloves, large sterile sheet, hand hygiene and cutaneous antisepsis) and infiltrated locally with 1% Lidocaine. Ultrasound demonstrated patency of the right basilic vein, and this was documented with an image. Under real-time ultrasound guidance, this vein was accessed with a 21 gauge micropuncture needle and image documentation was performed. A 0.018 wire was introduced in to the vein. Over this, a 5 [place] single  lumen power-injectable PICC was advanced to the lower SVC/right atrial junction. Fluoroscopy during the procedure and fluoro spot radiograph confirms appropriate catheter position. The catheter was flushed and covered with a sterile dressing. Catheter length: 40 cm IMPRESSION: Successful right arm Power PICC line placement with ultrasound and fluoroscopic guidance. The catheter is  ready for use. Read by: Rowe Robert, PA-C Electronically Signed   By: Corrie Mckusick D.O.   On: 03/03/2020 11:20    (Echo, Carotid, EGD, Colonoscopy, ERCP)    Subjective: Patient resting in bed awake alert anxious to go home  Discharge Exam: Vitals:   03/31/20 0510 03/31/20 1447  BP: (!) 149/72 (!) 146/87  Pulse: (!) 56 76  Resp: 16 17  Temp: 98.3 F (36.8 C) 98 F (36.7 C)  SpO2: 95% 95%   Vitals:   03/30/20 1705 03/30/20 2150 03/31/20 0510 03/31/20 1447  BP: (!) 148/81 (!) 143/71 (!) 149/72 (!) 146/87  Pulse: 60 74 (!) 56 76  Resp: 17 14 16 17   Temp: 98.5 F (36.9 C) 98.2 F (36.8 C) 98.3 F (36.8 C) 98 F (36.7 C)  TempSrc: Oral Oral Oral Oral  SpO2: 96% 92% 95% 95%  Weight:      Height:        General: Pt is alert, awake, not in acute distress Cardiovascular: RRR, S1/S2 +, no rubs, no gallops Respiratory: CTA bilaterally, no wheezing, no rhonchi Abdominal: Soft, NT, ND, bowel sounds + Extremities: no edema, no cyanosis    The results of significant diagnostics from this hospitalization (including imaging, microbiology, ancillary and laboratory) are listed below for reference.     Microbiology: Recent Results (from the past 240 hour(s))  Urine Culture     Status: Abnormal   Collection Time: 03/25/20  2:36 PM   Specimen: Urine, Clean Catch  Result Value Ref Range Status   Specimen Description   Final    URINE, CLEAN CATCH Performed at [facility], [address], [place], [place] [address]    Special Requests   Final    NONE Performed at [facility], [place] [address], [place], [place] [address]    Culture (A)  Final    <10,000 COLONIES/mL INSIGNIFICANT GROWTH Performed at [place] [address], [place], [place] [address]    Report Status 03/26/2020 FINAL  Final  Culture, blood (routine x 2)     Status: None   Collection Time: 03/26/20 10:20 AM   Specimen: BLOOD  Result Value Ref Range  Status   Specimen Description   Final    BLOOD BLOOD RIGHT FOREARM Performed at [facility] [address], [place], [place] [address]    Special Requests   Final    BOTTLES DRAWN AEROBIC AND ANAEROBIC Blood Culture adequate volume Performed at [facility] [address], [place], [place] [address]    Culture   Final    NO GROWTH 5 DAYS Performed at [facility], [place] [address], [place], [place] [address]    Report Status 03/31/2020 FINAL  Final  Respiratory Panel by RT PCR (Flu A&B, Covid) - Nasopharyngeal Swab     Status: None   Collection Time: 03/26/20 10:24 AM   Specimen: Nasopharyngeal Swab  Result Value Ref Range Status   SARS Coronavirus 2 by RT PCR NEGATIVE NEGATIVE Final    Comment: (NOTE) SARS-CoV-2 target nucleic acids are NOT DETECTED.  The SARS-CoV-2 RNA is generally detectable in upper  respiratoy specimens during the acute phase of infection. The lowest concentration of SARS-CoV-2 viral copies this assay can detect is 131 copies/mL. A negative result does not preclude SARS-Cov-2 infection and should not be used as the sole basis for treatment or other patient management decisions. A negative result may occur with  improper specimen collection/handling, submission of specimen other than nasopharyngeal swab, presence of viral mutation(s) within the areas targeted by this assay, and inadequate number of viral copies (<131 copies/mL). A negative result must be combined with clinical observations, patient history, and epidemiological information. The expected result is Negative.  Fact Sheet for Patients:  PinkCheek.be  Fact Sheet for Healthcare Providers:  GravelBags.it  This test is no t yet approved or cleared by the [place] FDA and  has been authorized for detection and/or diagnosis of SARS-CoV-2 by FDA under an Emergency Use Authorization (EUA). This  EUA will remain  in effect (meaning this test can be used) for the duration of the COVID-19 declaration under Section 564(b)(1) of the Act, 21 U.S.C. section 360bbb-3(b)(1), unless the authorization is terminated or revoked sooner.     Influenza A by PCR NEGATIVE NEGATIVE Final   Influenza B by PCR NEGATIVE NEGATIVE Final    Comment: (NOTE) The Xpert Xpress SARS-CoV-2/FLU/RSV assay is intended as an aid in  the diagnosis of influenza from Nasopharyngeal swab specimens and  should not be used as a sole basis for treatment. Nasal washings and  aspirates are unacceptable for Xpert Xpress SARS-CoV-2/FLU/RSV  testing.  Fact Sheet for Patients: PinkCheek.be  Fact Sheet for Healthcare Providers: GravelBags.it  This test is not yet approved or cleared by the [place] FDA and  has been authorized for detection and/or diagnosis of SARS-CoV-2 by  FDA under an Emergency Use Authorization (EUA). This EUA will remain  in effect (meaning this test can be used) for the duration of the  Covid-19 declaration under Section 564(b)(1) of the Act, 21  U.S.C. section 360bbb-3(b)(1), unless the authorization is  terminated or revoked. Performed at [facility], [place] [address], [place], [place] [address]   Culture, blood (routine x 2)     Status: None   Collection Time: 03/26/20 10:24 AM   Specimen: BLOOD  Result Value Ref Range Status   Specimen Description   Final    BLOOD BLOOD LEFT FOREARM Performed at [facility] [address] [place], [place] [address]    Special Requests   Final    BOTTLES DRAWN AEROBIC AND ANAEROBIC Blood Culture adequate volume Performed at [facility] [address], [place], [place] [address]    Culture   Final    NO GROWTH 5 DAYS Performed at [facility], [place] [address] [place], [place] [address]    Report Status 03/31/2020 FINAL   Final     Labs: BNP (last 3 results) No results for input(s): BNP in the last 8760 hours. Basic Metabolic Panel: Recent Labs  Lab 03/26/20 0129 03/27/20 0431 03/29/20 0555  NA 134* 138 140  K 3.9 3.6 3.9  CL 103 103 111  CO2 21* 26 23  GLUCOSE 143* 86 253*  BUN 18 9 6*  CREATININE 0.77 0.91 0.68  CALCIUM 9.1 8.6* 8.8*   Liver Function Tests: Recent Labs  Lab 03/26/20 0129 03/29/20 0555  AST 17 14*  ALT 13 13  ALKPHOS 59 53  BILITOT 0.6 0.5  PROT 7.2 5.7*  ALBUMIN 3.6 2.7*   Recent Labs  Lab 03/26/20 0129  LIPASE 44   No results for input(s): AMMONIA in the last 168 hours. CBC: Recent Labs  Lab 03/26/20 0129 03/27/20 0431 03/29/20 0555  WBC 4.4 1.6* 2.5*  NEUTROABS  --  0.9*  --   HGB 11.8* 9.0* 10.1*  HCT 36.4 28.8* 31.3*  MCV 83.7 87.3 87.2  PLT 226 131* 163   Cardiac Enzymes: No results for input(s): CKTOTAL, CKMB, CKMBINDEX, TROPONINI in the last 168 hours. BNP: Invalid input(s): POCBNP CBG: No results for input(s): GLUCAP in the last 168 hours. D-Dimer No results for input(s): DDIMER in the last 72 hours. Hgb A1c No results for input(s): HGBA1C in the last 72 hours. Lipid Profile No results for input(s): CHOL, HDL, LDLCALC, TRIG, CHOLHDL, LDLDIRECT in the last 72 hours. Thyroid function studies No results for input(s): TSH, T4TOTAL, T3FREE, THYROIDAB in the last 72 hours.  Invalid input(s): FREET3 Anemia work up No results for input(s): VITAMINB12, FOLATE, FERRITIN, TIBC, IRON, RETICCTPCT in the last 72 hours. Urinalysis    Component Value Date/Time   COLORURINE YELLOW 03/26/2020 0340   APPEARANCEUR HAZY (A) 03/26/2020 0340   LABSPEC 1.021 03/26/2020 0340   PHURINE 5.0 03/26/2020 0340   GLUCOSEU NEGATIVE 03/26/2020 0340   GLUCOSEU NEGATIVE 01/16/2013 0903   HGBUR MODERATE (A) 03/26/2020 0340   BILIRUBINUR NEGATIVE 03/26/2020 0340   KETONESUR 5 (A) 03/26/2020 0340   PROTEINUR 30 (A) 03/26/2020 0340   UROBILINOGEN 0.2 01/16/2013  0903   NITRITE NEGATIVE 03/26/2020 0340   LEUKOCYTESUR SMALL (A) 03/26/2020 0340   Sepsis Labs Invalid input(s): PROCALCITONIN,  WBC,  LACTICIDVEN Microbiology Recent Results (from the past 240 hour(s))  Urine Culture     Status: Abnormal   Collection Time: 03/25/20  2:36 PM   Specimen: Urine, Clean Catch  Result Value Ref Range Status   Specimen Description   Final    URINE, CLEAN CATCH Performed at [facility], [place] [address], [place], [place] [address]    Special Requests   Final    NONE Performed at [facility], [place] [address], [place], [place] [address]    Culture (A)  Final    <10,000 COLONIES/mL INSIGNIFICANT GROWTH Performed at [facility] [address], [place], [place] [address]    Report Status 03/26/2020 FINAL  Final  Culture, blood (routine x 2)     Status: None   Collection Time: 03/26/20 10:20 AM   Specimen: BLOOD  Result Value Ref Range Status   Specimen Description   Final    BLOOD BLOOD RIGHT FOREARM Performed at [facility] [address], [place], [place] [address]    Special Requests   Final    BOTTLES DRAWN AEROBIC AND ANAEROBIC Blood Culture adequate volume Performed at [facility] [address] [place], [place] [address]    Culture   Final    NO GROWTH 5 DAYS Performed at [facility], [place] [address] [place], [place] [address]    Report Status 03/31/2020 FINAL  Final  Respiratory Panel by RT PCR (Flu A&B, Covid) - Nasopharyngeal Swab     Status: None   Collection Time: 03/26/20 10:24 AM   Specimen: Nasopharyngeal Swab  Result Value Ref Range Status   SARS Coronavirus 2 by RT PCR NEGATIVE NEGATIVE Final    Comment: (NOTE) SARS-CoV-2 target nucleic acids are NOT DETECTED.  The SARS-CoV-2 RNA is generally detectable in upper respiratoy specimens during the acute phase  of infection. The lowest concentration of SARS-CoV-2 viral copies  this assay can detect is 131 copies/mL. A negative result does not preclude SARS-Cov-2 infection and should not be used as the sole basis for treatment or other patient management decisions. A negative result may occur with  improper specimen collection/handling, submission of specimen other than nasopharyngeal swab, presence of viral mutation(s) within the areas targeted by this assay, and inadequate number of viral copies (<131 copies/mL). A negative result must be combined with clinical observations, patient history, and epidemiological information. The expected result is Negative.  Fact Sheet for Patients:  PinkCheek.be  Fact Sheet for Healthcare Providers:  GravelBags.it  This test is no t yet approved or cleared by the [place] FDA and  has been authorized for detection and/or diagnosis of SARS-CoV-2 by FDA under an Emergency Use Authorization (EUA). This EUA will remain  in effect (meaning this test can be used) for the duration of the COVID-19 declaration under Section 564(b)(1) of the Act, 21 U.S.C. section 360bbb-3(b)(1), unless the authorization is terminated or revoked sooner.     Influenza A by PCR NEGATIVE NEGATIVE Final   Influenza B by PCR NEGATIVE NEGATIVE Final    Comment: (NOTE) The Xpert Xpress SARS-CoV-2/FLU/RSV assay is intended as an aid in  the diagnosis of influenza from Nasopharyngeal swab specimens and  should not be used as a sole basis for treatment. Nasal washings and  aspirates are unacceptable for Xpert Xpress SARS-CoV-2/FLU/RSV  testing.  Fact Sheet for Patients: PinkCheek.be  Fact Sheet for Healthcare Providers: GravelBags.it  This test is not yet approved or cleared by the [place] FDA and  has been authorized for detection and/or diagnosis of SARS-CoV-2 by  FDA under an Emergency Use Authorization (EUA). This EUA  will remain  in effect (meaning this test can be used) for the duration of the  Covid-19 declaration under Section 564(b)(1) of the Act, 21  U.S.C. section 360bbb-3(b)(1), unless the authorization is  terminated or revoked. Performed at [facility], [place] [address] [place], [place] [address]   Culture, blood (routine x 2)     Status: None   Collection Time: 03/26/20 10:24 AM   Specimen: BLOOD  Result Value Ref Range Status   Specimen Description   Final    BLOOD BLOOD LEFT FOREARM Performed at [place] [address], [place], [place] [address]    Special Requests   Final    BOTTLES DRAWN AEROBIC AND ANAEROBIC Blood Culture adequate volume Performed at [place] [address] [place], [place] [address]    Culture   Final    NO GROWTH 5 DAYS Performed at [facility], [place] [address], [place], [place] [address]    Report Status 03/31/2020 FINAL  Final     Time coordinating discharge: 39 minutes  SIGNED:   Georgette Shell, MD  [facility] 04/01/2020, 4:24 PM

## 2020-04-02 ENCOUNTER — Telehealth: Payer: Self-pay

## 2020-04-02 ENCOUNTER — Emergency Department (HOSPITAL_COMMUNITY): Payer: BC Managed Care – PPO

## 2020-04-02 ENCOUNTER — Inpatient Hospital Stay (HOSPITAL_COMMUNITY)
Admission: EM | Admit: 2020-04-02 | Discharge: 2020-04-09 | DRG: 394 | Disposition: A | Discharge status: Home / Self Care | Payer: BC Managed Care – PPO | Attending: Internal Medicine | Admitting: Internal Medicine

## 2020-04-02 ENCOUNTER — Other Ambulatory Visit: Payer: Self-pay

## 2020-04-02 ENCOUNTER — Encounter (HOSPITAL_COMMUNITY): Payer: Self-pay

## 2020-04-02 DIAGNOSIS — Z8249 Family history of ischemic heart disease and other diseases of the circulatory system: Secondary | ICD-10-CM | POA: Diagnosis not present

## 2020-04-02 DIAGNOSIS — Z8041 Family history of malignant neoplasm of ovary: Secondary | ICD-10-CM | POA: Diagnosis not present

## 2020-04-02 DIAGNOSIS — F419 Anxiety disorder, unspecified: Secondary | ICD-10-CM | POA: Diagnosis present

## 2020-04-02 DIAGNOSIS — C210 Malignant neoplasm of anus, unspecified: Secondary | ICD-10-CM | POA: Diagnosis present

## 2020-04-02 DIAGNOSIS — R1030 Lower abdominal pain, unspecified: Secondary | ICD-10-CM

## 2020-04-02 DIAGNOSIS — R1031 Right lower quadrant pain: Secondary | ICD-10-CM

## 2020-04-02 DIAGNOSIS — R109 Unspecified abdominal pain: Secondary | ICD-10-CM | POA: Diagnosis not present

## 2020-04-02 DIAGNOSIS — Z20822 Contact with and (suspected) exposure to covid-19: Secondary | ICD-10-CM | POA: Diagnosis present

## 2020-04-02 DIAGNOSIS — K219 Gastro-esophageal reflux disease without esophagitis: Secondary | ICD-10-CM | POA: Diagnosis present

## 2020-04-02 DIAGNOSIS — K520 Gastroenteritis and colitis due to radiation: Secondary | ICD-10-CM | POA: Diagnosis not present

## 2020-04-02 DIAGNOSIS — N136 Pyonephrosis: Secondary | ICD-10-CM | POA: Diagnosis not present

## 2020-04-02 DIAGNOSIS — N1330 Unspecified hydronephrosis: Secondary | ICD-10-CM | POA: Diagnosis present

## 2020-04-02 DIAGNOSIS — E785 Hyperlipidemia, unspecified: Secondary | ICD-10-CM | POA: Diagnosis present

## 2020-04-02 DIAGNOSIS — Z7984 Long term (current) use of oral hypoglycemic drugs: Secondary | ICD-10-CM | POA: Diagnosis not present

## 2020-04-02 DIAGNOSIS — Y842 Radiological procedure and radiotherapy as the cause of abnormal reaction of the patient, or of later complication, without mention of misadventure at the time of the procedure: Secondary | ICD-10-CM | POA: Diagnosis present

## 2020-04-02 DIAGNOSIS — B965 Pseudomonas (aeruginosa) (mallei) (pseudomallei) as the cause of diseases classified elsewhere: Secondary | ICD-10-CM | POA: Diagnosis not present

## 2020-04-02 DIAGNOSIS — K529 Noninfective gastroenteritis and colitis, unspecified: Secondary | ICD-10-CM

## 2020-04-02 DIAGNOSIS — E119 Type 2 diabetes mellitus without complications: Secondary | ICD-10-CM | POA: Diagnosis present

## 2020-04-02 DIAGNOSIS — D6959 Other secondary thrombocytopenia: Secondary | ICD-10-CM | POA: Diagnosis present

## 2020-04-02 DIAGNOSIS — D696 Thrombocytopenia, unspecified: Secondary | ICD-10-CM | POA: Diagnosis present

## 2020-04-02 DIAGNOSIS — A498 Other bacterial infections of unspecified site: Secondary | ICD-10-CM | POA: Diagnosis present

## 2020-04-02 DIAGNOSIS — T451X5A Adverse effect of antineoplastic and immunosuppressive drugs, initial encounter: Secondary | ICD-10-CM | POA: Diagnosis not present

## 2020-04-02 DIAGNOSIS — F32A Depression, unspecified: Secondary | ICD-10-CM | POA: Diagnosis present

## 2020-04-02 DIAGNOSIS — R1110 Vomiting, unspecified: Secondary | ICD-10-CM | POA: Diagnosis not present

## 2020-04-02 DIAGNOSIS — N390 Urinary tract infection, site not specified: Secondary | ICD-10-CM

## 2020-04-02 DIAGNOSIS — Z79899 Other long term (current) drug therapy: Secondary | ICD-10-CM

## 2020-04-02 DIAGNOSIS — N3000 Acute cystitis without hematuria: Secondary | ICD-10-CM | POA: Diagnosis not present

## 2020-04-02 LAB — BASIC METABOLIC PANEL
Anion gap: 13 (ref 5–15)
BUN: 24 mg/dL — ABNORMAL HIGH (ref 8–23)
CO2: 25 mmol/L (ref 22–32)
Calcium: 9.1 mg/dL (ref 8.9–10.3)
Chloride: 101 mmol/L (ref 98–111)
Creatinine, Ser: 0.77 mg/dL (ref 0.44–1.00)
GFR, Estimated: >60 mL/min (ref >60)
Glucose, Bld: 153 mg/dL — ABNORMAL HIGH (ref 70–99)
Potassium: 3.1 mmol/L — ABNORMAL LOW (ref 3.5–5.1)
Sodium: 139 mmol/L (ref 135–145)

## 2020-04-02 LAB — CBC WITH DIFFERENTIAL/PLATELET
Abs Immature Granulocytes: 0.15 10*3/uL — ABNORMAL HIGH (ref 0.00–0.07)
Basophils Absolute: 0 10*3/uL (ref 0.0–0.1)
Basophils Relative: 0 %
Eosinophils Absolute: 0 10*3/uL (ref 0.0–0.5)
Eosinophils Relative: 0 %
HCT: 38.6 % (ref 36.0–46.0)
Hemoglobin: 12.7 g/dL (ref 12.0–15.0)
Immature Granulocytes: 2 %
Lymphocytes Relative: 5 %
Lymphs Abs: 0.3 10*3/uL — ABNORMAL LOW (ref 0.7–4.0)
MCH: 27.7 pg (ref 26.0–34.0)
MCHC: 32.9 g/dL (ref 30.0–36.0)
MCV: 84.1 fL (ref 80.0–100.0)
Monocytes Absolute: 0.4 10*3/uL (ref 0.1–1.0)
Monocytes Relative: 5 %
Neutro Abs: 5.8 10*3/uL (ref 1.7–7.7)
Neutrophils Relative %: 88 %
Platelets: 205 10*3/uL (ref 150–400)
RBC: 4.59 MIL/uL (ref 3.87–5.11)
RDW: 16.3 % — ABNORMAL HIGH (ref 11.5–15.5)
WBC: 6.6 10*3/uL (ref 4.0–10.5)
nRBC: 0.5 % — ABNORMAL HIGH (ref 0.0–0.2)

## 2020-04-02 LAB — GLUCOSE, CAPILLARY: Glucose-Capillary: 98 mg/dL (ref 70–99)

## 2020-04-02 LAB — URINALYSIS, ROUTINE W REFLEX MICROSCOPIC
Bacteria, UA: NONE SEEN
Bilirubin Urine: NEGATIVE
Glucose, UA: NEGATIVE mg/dL
Ketones, ur: NEGATIVE mg/dL
Leukocytes,Ua: NEGATIVE
Nitrite: NEGATIVE
Protein, ur: NEGATIVE mg/dL
Specific Gravity, Urine: 1.044 — ABNORMAL HIGH (ref 1.005–1.030)
pH: 6 (ref 5.0–8.0)

## 2020-04-02 LAB — RESPIRATORY PANEL BY RT PCR (FLU A&B, COVID)
Influenza A by PCR: NEGATIVE
Influenza B by PCR: NEGATIVE
SARS Coronavirus 2 by RT PCR: NEGATIVE

## 2020-04-02 MED ORDER — METHYLPREDNISOLONE SODIUM SUCC 125 MG IJ SOLR
60.0000 mg | Freq: Three times a day (TID) | INTRAMUSCULAR | Status: DC
  Administered 2020-04-02 – 2020-04-04 (×6): 60 mg via INTRAVENOUS
  Filled 2020-04-02 (×6): qty 2

## 2020-04-02 MED ORDER — SODIUM CHLORIDE 0.9 % IV SOLN: INTRAVENOUS | Status: DC

## 2020-04-02 MED ORDER — ONDANSETRON HCL 4 MG/2ML IJ SOLN: 4.0000 mg | Freq: Four times a day (QID) | INTRAMUSCULAR | Status: DC | PRN

## 2020-04-02 MED ORDER — ONDANSETRON HCL 4 MG/2ML IJ SOLN
4.0000 mg | Freq: Once | INTRAMUSCULAR | Status: AC
  Administered 2020-04-02: 4 mg via INTRAVENOUS
  Filled 2020-04-02: qty 2

## 2020-04-02 MED ORDER — IOHEXOL 300 MG/ML  SOLN
100.0000 mL | Freq: Once | INTRAMUSCULAR | Status: AC | PRN
  Administered 2020-04-02: 100 mL via INTRAVENOUS

## 2020-04-02 MED ORDER — CLORAZEPATE DIPOTASSIUM 3.75 MG PO TABS
7.5000 mg | ORAL_TABLET | Freq: Two times a day (BID) | ORAL | Status: DC
  Administered 2020-04-02 – 2020-04-09 (×14): 7.5 mg via ORAL
  Filled 2020-04-02 (×15): qty 2

## 2020-04-02 MED ORDER — FLUOXETINE HCL 20 MG PO CAPS
20.0000 mg | ORAL_CAPSULE | Freq: Every morning | ORAL | Status: DC
  Administered 2020-04-02 – 2020-04-09 (×8): 20 mg via ORAL
  Filled 2020-04-02 (×8): qty 1

## 2020-04-02 MED ORDER — HYDROMORPHONE HCL 1 MG/ML IJ SOLN
1.0000 mg | Freq: Once | INTRAMUSCULAR | Status: AC
  Administered 2020-04-02: 1 mg via INTRAVENOUS
  Filled 2020-04-02: qty 1

## 2020-04-02 MED ORDER — ACETAMINOPHEN 325 MG PO TABS
650.0000 mg | ORAL_TABLET | Freq: Four times a day (QID) | ORAL | Status: DC | PRN
  Administered 2020-04-02 – 2020-04-09 (×2): 650 mg via ORAL
  Filled 2020-04-02 (×2): qty 2

## 2020-04-02 MED ORDER — ENOXAPARIN SODIUM 40 MG/0.4ML ~~LOC~~ SOLN: 40.0000 mg | Freq: Every day | SUBCUTANEOUS | Status: DC

## 2020-04-02 MED ORDER — ONDANSETRON HCL 4 MG PO TABS: 4.0000 mg | ORAL_TABLET | Freq: Four times a day (QID) | ORAL | Status: DC | PRN

## 2020-04-02 MED ORDER — SODIUM CHLORIDE 0.9% FLUSH
3.0000 mL | Freq: Two times a day (BID) | INTRAVENOUS | Status: DC
  Administered 2020-04-02 – 2020-04-09 (×12): 3 mL via INTRAVENOUS

## 2020-04-02 MED ORDER — ACETAMINOPHEN 650 MG RE SUPP: 650.0000 mg | Freq: Four times a day (QID) | RECTAL | Status: DC | PRN

## 2020-04-02 MED ORDER — PROPRANOLOL HCL 10 MG PO TABS
10.0000 mg | ORAL_TABLET | Freq: Two times a day (BID) | ORAL | Status: DC
  Administered 2020-04-03 – 2020-04-08 (×10): 10 mg via ORAL
  Filled 2020-04-02 (×12): qty 1

## 2020-04-02 MED ORDER — INSULIN ASPART 100 UNIT/ML ~~LOC~~ SOLN
0.0000 [IU] | SUBCUTANEOUS | Status: DC
  Administered 2020-04-03: 4 [IU] via SUBCUTANEOUS
  Administered 2020-04-03: 3 [IU] via SUBCUTANEOUS
  Administered 2020-04-03 – 2020-04-04 (×4): 4 [IU] via SUBCUTANEOUS
  Administered 2020-04-04: 3 [IU] via SUBCUTANEOUS

## 2020-04-02 MED ORDER — MORPHINE SULFATE (PF) 2 MG/ML IV SOLN
2.0000 mg | INTRAVENOUS | Status: DC | PRN
  Administered 2020-04-04: 2 mg via INTRAVENOUS
  Filled 2020-04-02: qty 1

## 2020-04-02 MED ORDER — LACTATED RINGERS IV BOLUS
1000.0000 mL | Freq: Once | INTRAVENOUS | Status: AC
  Administered 2020-04-02: 1000 mL via INTRAVENOUS

## 2020-04-02 NOTE — Assessment & Plan Note (Addendum)
-  unclear if contributing to abdominal pain; recent stone and stent removal; CT on admission shows nonspecific urothelial wall thickening and hyperenhancement of the distal 2 to 3 cm of the right pelvic ureter - repeat CT on 10/31 does not show any worsening; patient okay with monitoring further and outpatient follow up

## 2020-04-02 NOTE — ED Notes (Addendum)
Patient was recently admitted for abdominal pain, kidney stone with lithotripsy/stent placement.  Stent was not left in place due to kidney draining well and foley was removed 03/27/2020. She was discharge from this facility yesterday.

## 2020-04-02 NOTE — Telephone Encounter (Signed)
Melinda [name] called she states she is having severe right sided back pain and abd pain, she is crying.  She states she is dizzy.  She took morphine last night. Later she had an emesis.  She has an appt with [facility] at 1445.  I reviewed with Dr Burr Medico Melinda [name] was d/c from hospital on Monday after having a renal calculus and ileitis.  Per Dr Burr Medico we are seeing if we can give her IVF and pain medication here in infusion otherwise she will need to go to the ED or wait for her appt with Urology. There is no availability in the infusion center this am to give IVF.  Per Dr. Burr Medico I told Melinda [name] that if she can not wait until her urology appt this afternoon she needs to go to the ED.  She verbalized understanding.

## 2020-04-02 NOTE — Assessment & Plan Note (Addendum)
-   differential for etiology includes terminal ileitis (?Radiation-associated) versus right ureteral thickening with associated hydroureteronephrosis -Appreciate urology consult.  Her abdominal pain was more diffuse on admission and now is localizing to RLQ; throughout hospitalization this pain seems to have slowly but consistently improved little by little each day; this has been mainly due to supportive care with minimal use of opiates; also vaginal valium/toradol did not provide much relief or difference she says - in addition, started her on cefepime on 04/05/2020 after she began describing symptoms and urine culture grew Pseudomonas and she has continued to feel better since (see UTI) - lastly, we repeated CT on 10/31. This overall shows improvement of ileitis and stable/slight improvement in urinary issues so patient is okay with continuing conservative measures for now - outpatient follow up with urology - steroids stopped on 10/31

## 2020-04-02 NOTE — Assessment & Plan Note (Signed)
-   most recently treated with chemoradiation with mitomycin and 5-FU, last treatment 03/07/2020 -Follows with Dr. Burr Medico; appreciate assistance as well in the management

## 2020-04-02 NOTE — ED Provider Notes (Signed)
[facility] DEPT Provider Note   CSN: [id number] Arrival date & time: 04/02/20  1225     History Chief Complaint  Patient presents with   Emesis   Abdominal Pain    Melinda [name] is a 59 y.o. female.  HPI Patient presents with nausea vomiting abdominal pain.  Recent admission to the hospital with discharge couple days ago.  Had large kidney stone.  Also had active ileitis.  Was discharged without a stent since kidney was draining freely.  Had been on prednisone thinking that the ileitis was secondary to radiation for her anal cancer.  Over the last couple days has worsened.  Decreased oral intake.  Discussed with urology who thinks this is not a urologic issue thinks it is more likely the bowel infection.  Likely require admission in the hospital.  They will be following patient.  Patient has been vomiting for 2 days.  Last chemo and radiation was around 26 days ago.    Past Medical History:  Diagnosis Date   anal ca dx'd 07/2019   Diabetes mellitus type 2, controlled ([place])    Dyslipidemia    untreated   GERD (gastroesophageal reflux disease)    Hemorrhoids    Microcytic anemia 06/22/2012   Nephrolith    Palpitation    positive tilt table test. Prozac and Inderal treatment effective.    Patient Active Problem List   Diagnosis Date Noted   Abdominal pain 03/26/2020   Ileitis 03/26/2020   Hydroureteronephrosis 03/26/2020   Acute lower UTI 03/26/2020   Intractable nausea and vomiting 03/13/2020   Squamous cell cancer, anus ([place]) 01/16/2020   Diabetes mellitus ([place]) 01/16/2020   Goals of care, counseling/discussion 01/16/2020   Chest pain 06/26/2012   Iron deficiency anemia 06/22/2012   Near syncope 06/20/2012   Heart palpitations 04/28/2011   Hypertension 04/28/2011   Dyslipidemia 04/28/2011    Past Surgical History:  Procedure Laterality Date   APPENDECTOMY     BREAST REDUCTION SURGERY     CARDIAC  CATHETERIZATION  2002   normal LV function and no significant coronary obstruction   CATARACT EXTRACTION, BILATERAL Bilateral november 2020 and dcember 2020   CYST EXCISION N/A 01/11/2020   Procedure: EXCISION OF ANAL CANAL MASS;  Surgeon: Ileana Roup, MD;  Location: [facility];  Service: General;  Laterality: N/A;   CYSTOSCOPY/URETEROSCOPY/HOLMIUM LASER/STENT PLACEMENT Right 03/26/2020   Procedure: CYSTOSCOPY/URETEROSCOPY/HOLMIUM LASER/STENT PLACEMENT;  Surgeon: [name], MD;  Location: [facility];  Service: Urology;  Laterality: Right;   ESOPHAGEAL MANOMETRY N/A 04/04/2017   Procedure: ESOPHAGEAL MANOMETRY (EM);  Surgeon: Laurence Spates, MD;  Location: [facility];  Service: Endoscopy;  Laterality: N/A;   HEMORRHOID SURGERY N/A 07/13/2019   Procedure: HEMORRHOIDECTOMY;  Surgeon: Johnathan Hausen, MD;  Location: [facility];  Service: General;  Laterality: N/A;   KIDNEY SURGERY     kidney stones    PARTIAL HYSTERECTOMY     RECTAL EXAM UNDER ANESTHESIA N/A 01/11/2020   Procedure: RECTAL EXAM UNDER ANESTHESIA;  Surgeon: Ileana Roup, MD;  Location: [facility];  Service: General;  Laterality: N/A;   TONSILLECTOMY       OB History   No obstetric history on file.     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 Authorizing Provider  clorazepate (TRANXENE) 7.5 MG tablet Take 7.5 mg by mouth 2 (two) times daily. 02/12/18   [provider]  CRESTOR 5 MG tablet Take 5 mg by mouth every evening.  08/10/14   [provider]  esomeprazole (NEXIUM) 40 MG capsule Take 40 mg by mouth daily at 12 noon.    [provider]  feeding supplement (ENSURE ENLIVE / ENSURE PLUS) LIQD Take 237 mLs by mouth daily. 03/21/20   Hongalgi, Lenis Dickinson, MD  FLUoxetine (PROZAC) 20 MG capsule Take 20 mg by mouth every morning. 10/23/19   [provider]  Loperamide HCl (IMODIUM PO) Take 2 capsules by mouth every 6 (six) hours as needed (loose stool).     [provider]  metFORMIN (GLUCOPHAGE) 500 MG tablet Take 500 mg by mouth 2 (two) times daily with a meal.     [provider]  methenamine (HIPREX) 1 g tablet Take 1 g by mouth daily.     [provider]  morphine (MSIR) 15 MG tablet Take 0.5-1 tablets (7.5-15 mg total) by mouth every 8 (eight) hours as needed for severe pain. 03/31/20   Truitt Merle, MD  NU-IRON 150 MG capsule Take 150 mg by mouth daily.  02/02/17   [provider]  ondansetron (ZOFRAN) 8 MG tablet Take 1 tablet (8 mg total) by mouth 2 (two) times daily as needed (Nausea or vomiting). Patient taking differently: Take 8 mg by mouth 2 (two) times daily as needed for nausea or vomiting.  01/17/20   Truitt Merle, MD  phenazopyridine (PYRIDIUM) 100 MG tablet Take 1 tablet (100 mg total) by mouth 3 (three) times daily as needed for pain. 03/25/20   Tanner, Lyndon Code., PA-C  prochlorperazine (COMPAZINE) 10 MG tablet Take 1 tablet (10 mg total) by mouth every 6 (six) hours as needed (Nausea or vomiting). Patient taking differently: Take 10 mg by mouth every 6 (six) hours as needed for nausea.  01/17/20   Truitt Merle, MD  propranolol (INDERAL) 10 MG tablet TAKE 1 TABLET BY MOUTH TWICE A DAY Patient taking  differently: Take 10 mg by mouth 2 (two) times daily.  07/11/19   Fay Records, MD  protein supplement (RESOURCE BENEPROTEIN) 6 g POWD Take 1 Scoop (6 g total) by mouth 3 (three) times daily with meals. 03/20/20   Hongalgi, Lenis Dickinson, MD  Pumpkin Seed-Soy Germ (AZO BLADDER CONTROL/GO-LESS PO) Take 1 tablet by mouth daily as needed (pain & irritation).    [provider]  silver sulfADIAZINE (SILVADENE) 1 % cream Apply 1 application topically daily. 02/21/20   Tanner, Lyndon Code., PA-C  traMADol (ULTRAM) 50 MG tablet Take 50 mg by mouth every 6 (six) hours as needed for moderate pain.  [provider]    Allergies    Cephalexin, Amoxapine and related, Amoxicillin-pot clavulanate, Atorvastatin, Cefaclor, Ciprofloxacin, Codeine, Doxycycline calcium, Fenofibrate micronized, Fish oil, Flagyl [metronidazole hcl], Levofloxacin in d5w, Metronidazole, Ofloxacin, Prednisolone, Promethazine hcl, Tape, Tetracycline hcl, Erythromycin, and Nitrofurantoin  Review of Systems   Review of Systems  Constitutional: Positive for appetite change and fatigue.  HENT: Negative for congestion.   Respiratory: Negative for shortness of breath.   Cardiovascular: Negative for chest pain.  Gastrointestinal: Positive for abdominal pain, nausea and vomiting.  Genitourinary: Negative for dysuria and flank pain.  Musculoskeletal: Negative for back pain.  Skin: Negative for rash.  Neurological: Negative for weakness.  Psychiatric/Behavioral: Negative for confusion.    Physical Exam Updated Vital Signs BP 98/63    Pulse 86    Temp 97.6 F (36.4 C) (Oral)    Resp 13    SpO2 95%   Physical Exam Vitals and nursing note reviewed.  HENT:     Head: Normocephalic.  Eyes:     General: No scleral icterus. Cardiovascular:     Rate and Rhythm: Normal rate and regular rhythm.  Pulmonary:     Effort: Pulmonary effort is normal.     Breath sounds: Normal breath sounds.  Abdominal:     Comments: Lower abdominal  tenderness without hernia palpated.  Skin:    General: Skin is warm.     Capillary Refill: Capillary refill takes less than 2 seconds.  Neurological:     Mental Status: She is alert and oriented to person, place, and time.     ED Results / Procedures / Treatments   Labs (all labs ordered are listed, but only abnormal results are displayed) Labs Reviewed  CBC WITH DIFFERENTIAL/PLATELET - Abnormal; Notable for the following components:      Result Value   RDW 16.3 (*)    nRBC 0.5 (*)    Lymphs Abs 0.3 (*)    Abs Immature Granulocytes 0.15 (*)    All other components within normal limits  BASIC METABOLIC PANEL - Abnormal; Notable for the following components:   Potassium 3.1 (*)    Glucose, Bld 153 (*)    BUN 24 (*)    All other components within normal limits  URINE CULTURE  URINALYSIS, ROUTINE W REFLEX MICROSCOPIC    EKG None  Radiology No results found.  Procedures Procedures (including critical care time)  Medications Ordered in ED Medications  lactated ringers bolus 1,000 mL (0 mLs Intravenous Stopped 04/02/20 1550)  HYDROmorphone (DILAUDID) injection 1 mg (1 mg Intravenous Given 04/02/20 1407)  ondansetron (ZOFRAN) injection 4 mg (4 mg Intravenous Given 04/02/20 1406)  iohexol (OMNIPAQUE) 300 MG/ML solution 100 mL (100 mLs Intravenous Contrast Given 04/02/20 1506)    ED Course  I have reviewed the triage vital signs and the nursing notes.  Pertinent labs & imaging results that were available during my care of the patient were reviewed by me and considered in my medical decision making (see chart for details).    MDM Rules/Calculators/A&P                          Patient with abdominal pain.  Recent symptoms similar with kidney issues and ileitis.  Had been on steroids.  Worsened since she went home.  Discussed with urology who thinks this is more infectious versus ileum is the cause.  Not tolerating orals at home and pain not well controlled.  Will require  admission to the  hospital.  CT scan done and reviewed by me.  Will admit to hospitalist.  Urology will be following CT scan and following patient. Final Clinical Impression(s) / ED Diagnoses Final diagnoses:  Ileitis  Lower abdominal pain    Rx / DC Orders ED Discharge Orders    None       Davonna Belling, MD 04/02/20 1636

## 2020-04-02 NOTE — H&P (Signed)
History and Physical    Melinda [name]  MWU:[id number]  DOB: June 09, 1960  DOA: 04/02/2020  PCP: Crist Infante, MD Patient coming from: home  Chief Complaint: abdominal pain, N/V  HPI:  Melinda [name] is a 59 yo CF with PMH recurrent anal SCC (most recently treated with chemoradiation with mitomycin and 5-FU, last treatment 03/07/2020), DMII, HLD who presents to the ER with recurrent abdominal pain and nausea/vomiting at home. [name].  She was previously treated for radiation induced colitis earlier in October and a UTI.  Her most recent hospitalization was from 03/26/2020 to 04/01/2020 for obstructed right ureteral calculus with hydroureteronephrosis.  She underwent ureteroscopy with stone removal and laser lithotripsy and cystoscopy.  Distal ureteral stent was removed as kidney was noted to be draining well on the right side.  During that hospitalization she was treated with IV steroids, fluids, and pain medication.  She was not discharged with oral steroids due to anxiety associated with them.  She was also noted to have ileitis in the distal ileum.  She again presents to the ER with recurrent abdominal pain.  She states that on Tuesday she took some Colace then has 6 very soft/diarrheal bowel movements.  She then took some Imodium afterwards.  She states that she ate chocolate mousse at night which upset her stomach causing her to vomit.  Due to her recurrent abdominal pain she presented to the ER for evaluation once again.  She underwent repeat CT abdomen/pelvis on evaluation in the ER.  There was again noted to be distal and terminal ileitis, potentially treatment related.  There was moderate right hydroureteronephrosis to the level of the distal right pelvic ureter "similar to slightly worsened".  "Nonspecific urothelial wall thickening and hyperenhancement in the distal 2 to 3 cm of the right pelvic ureter, similar, probably inflammatory."  She was treated  with Dilaudid, LR bolus, and Zofran in the ER.  She is admitted for further evaluation and repeat consultation with urology.   I have personally briefly reviewed patient's old medical records in [facility] and discussed patient with the ER provider when appropriate/indicated.  Assessment/Plan: * Abdominal pain - differential for etiology includes terminal ileitis (?Radiation-associated) versus right ureteral thickening with associated hydroureteronephrosis -Appreciate urology consult.  Agree that abdominal pain is more diffuse at this time which may be due to discontinuation of steroid at recent discharge; patient may need prolonged taper -keep n.p.o. -Start IV fluids -Treat pain with morphine; nausea with Zofran -Restart IV Solu-Medrol, 60 mg every 8 hours and will taper.  Discussed with her that she may need oral outpatient course if recommended (could possibly do trial inpatient to see what oral regimen she could tolerate going home on)  Hydroureteronephrosis -unclear if contributing to abdominal pain; recent stone and stent removal; CT shows nonspecific urothelial wall thickening and hyperenhancement of the distal 2 to 3 cm of the right pelvic ureter -Follow-up urology recommendations in the morning  Recurrent anal squamous cell carcinoma ([place]) - most recently treated with chemoradiation with mitomycin and 5-FU, last treatment 03/07/2020 -Follows with Dr. Burr Medico; appreciate assistance as well in the management   Diabetes mellitus ([place]) - continue SSI and CBG monitoring - q4h while NPO     Code Status: Full DVT Prophylaxis: Lovenox Anticipated disposition is to: home  History: Past Medical History:  Diagnosis Date  . anal ca dx'd 07/2019  . Diabetes mellitus type 2, controlled ([place])   . Dyslipidemia    untreated  .  GERD (gastroesophageal reflux disease)   . Hemorrhoids   . Microcytic anemia 06/22/2012  . Nephrolith   . Palpitation    positive tilt table test. Prozac  and Inderal treatment effective.    Past Surgical History:  Procedure Laterality Date  . APPENDECTOMY    . BREAST REDUCTION SURGERY    . CARDIAC CATHETERIZATION  2002   normal LV function and no significant coronary obstruction  . CATARACT EXTRACTION, BILATERAL Bilateral november 2020 and dcember 2020  . CYST EXCISION N/A 01/11/2020   Procedure: EXCISION OF ANAL CANAL MASS;  Surgeon: Ileana Roup, MD;  Location: [facility];  Service: General;  Laterality: N/A;  . CYSTOSCOPY/URETEROSCOPY/HOLMIUM LASER/STENT PLACEMENT Right 03/26/2020   Procedure: CYSTOSCOPY/URETEROSCOPY/HOLMIUM LASER/STENT PLACEMENT;  Surgeon: [name], MD;  Location: [facility];  Service: Urology;  Laterality: Right;  . ESOPHAGEAL MANOMETRY N/A 04/04/2017   Procedure: ESOPHAGEAL MANOMETRY (EM);  Surgeon: Laurence Spates, MD;  Location: [facility];  Service: Endoscopy;  Laterality: N/A;  . HEMORRHOID SURGERY N/A 07/13/2019   Procedure: HEMORRHOIDECTOMY;  Surgeon: Johnathan Hausen, MD;  Location: [place];  Service: General;  Laterality: N/A;  . KIDNEY SURGERY     kidney stones   . PARTIAL HYSTERECTOMY    . RECTAL EXAM UNDER ANESTHESIA N/A 01/11/2020   Procedure: RECTAL EXAM UNDER ANESTHESIA;  Surgeon: Ileana Roup, MD;  Location: [facility];  Service: General;  Laterality: N/A;  . TONSILLECTOMY       reports that she quit smoking about 2 years ago. Her smoking use included e-cigarettes. She has never used smokeless tobacco. She reports that she does not drink alcohol and does not use drugs.  Allergies  Allergen Reactions  . Cephalexin Anaphylaxis and Other (See Comments)    Kef tabs only (maybe dye) Capsules-anaphylxis  . Amoxapine And Related   . Amoxicillin-Pot Clavulanate Other (See Comments)  . Atorvastatin Other (See Comments)    intolernace  . Cefaclor Other (See Comments)  . Ciprofloxacin Nausea And Vomiting  . Codeine Hives  . Doxycycline Calcium Nausea And Vomiting  .  Fenofibrate Micronized Other (See Comments)    Cramping  . Fish Oil Other (See Comments)  . Flagyl [Metronidazole Hcl]   . Levofloxacin In D5w Other (See Comments)    Unknown   . Metronidazole Nausea And Vomiting  . Ofloxacin Other (See Comments)  . Prednisolone Other (See Comments)    Anxiety, palpitations  . Promethazine Hcl Other (See Comments)  . Tape Other (See Comments)    Rash and blisters, only plastic tape  . Tetracycline Hcl Other (See Comments)  . Erythromycin Rash  . Nitrofurantoin Itching, Nausea And Vomiting and Rash    Family History  Problem Relation Age of Onset  . Heart disease Mother   . Liver disease Mother   . Heart disease Father   . Heart attack Father   . Cancer Father        prostate CA  . Cancer Maternal Grandmother 60       ovarian cancer  . Coronary artery disease Other        Fx of  . Heart attack Other   . Diabetes Other    Home Medications: Prior to Admission medications   Medication Sig Start Date End Date Taking? Authorizing Provider  clorazepate (TRANXENE) 7.5 MG tablet Take 7.5 mg by mouth 2 (two) times daily. 02/12/18   [provider]  CRESTOR 5 MG tablet Take 5 mg by mouth every evening.  08/10/14  [provider]  esomeprazole (NEXIUM) 40 MG capsule Take 40 mg by mouth daily at 12 noon.    [provider]  feeding supplement (ENSURE ENLIVE / ENSURE PLUS) LIQD Take 237 mLs by mouth daily. 03/21/20   Hongalgi, Lenis Dickinson, MD  FLUoxetine (PROZAC) 20 MG capsule Take 20 mg by mouth every morning. 10/23/19   [provider]  Loperamide HCl (IMODIUM PO) Take 2 capsules by mouth every 6 (six) hours as needed (loose stool).     [provider]  metFORMIN (GLUCOPHAGE) 500 MG tablet Take 500 mg by mouth 2 (two) times daily with a meal.     [provider]  methenamine (HIPREX) 1 g tablet Take 1 g by mouth daily.     [provider]  morphine (MSIR) 15 MG tablet Take 0.5-1 tablets (7.5-15 mg  total) by mouth every 8 (eight) hours as needed for severe pain. 03/31/20   Truitt Merle, MD  NU-IRON 150 MG capsule Take 150 mg by mouth daily.  02/02/17   [provider]  ondansetron (ZOFRAN) 8 MG tablet Take 1 tablet (8 mg total) by mouth 2 (two) times daily as needed (Nausea or vomiting). Patient taking differently: Take 8 mg by mouth 2 (two) times daily as needed for nausea or vomiting.  01/17/20   Truitt Merle, MD  phenazopyridine (PYRIDIUM) 100 MG tablet Take 1 tablet (100 mg total) by mouth 3 (three) times daily as needed for pain. 03/25/20   Tanner, Lyndon Code., PA-C  prochlorperazine (COMPAZINE) 10 MG tablet Take 1 tablet (10 mg total) by mouth every 6 (six) hours as needed (Nausea or vomiting). Patient taking differently: Take 10 mg by mouth every 6 (six) hours as needed for nausea.  01/17/20   Truitt Merle, MD  propranolol (INDERAL) 10 MG tablet TAKE 1 TABLET BY MOUTH TWICE A DAY Patient taking differently: Take 10 mg by mouth 2 (two) times daily.  07/11/19   Fay Records, MD  protein supplement (RESOURCE BENEPROTEIN) 6 g POWD Take 1 Scoop (6 g total) by mouth 3 (three) times daily with meals. 03/20/20   Hongalgi, Lenis Dickinson, MD  Pumpkin Seed-Soy Germ (AZO BLADDER CONTROL/GO-LESS PO) Take 1 tablet by mouth daily as needed (pain & irritation).    [provider]  silver sulfADIAZINE (SILVADENE) 1 % cream Apply 1 application topically daily. 02/21/20   Tanner, Lyndon Code., PA-C  traMADol (ULTRAM) 50 MG tablet Take 50 mg by mouth every 6 (six) hours as needed for moderate pain.    [provider]    Review of Systems:  Pertinent items noted in HPI and remainder of comprehensive ROS otherwise negative.  Physical Exam: Vitals:   04/02/20 1700 04/02/20 1715 04/02/20 1730 04/02/20 1745  BP: 104/66 112/78 118/87 140/77  Pulse: 83 86 80 81  Resp: 14 17 15 14   Temp:      TempSrc:      SpO2: 95% 98% 98% 97%   General appearance: Pleasant adult woman lying in bed appearing uncomfortable  but in no obvious distress Head: Normocephalic, without obvious abnormality, atraumatic Eyes: EOMI Lungs: clear to auscultation bilaterally Heart: regular rate and rhythm and S1, S2 normal Abdomen: Soft.  Worst tenderness to palpation is in right lower quadrant however also tender in left lower quadrant as well as right upper quadrant.  No obvious rebound or guarding.  Bowel sounds hypoactive in the upper quadrants compared to lower quadrants Extremities: No edema Skin: mobility and turgor normal Neurologic: Grossly  normal  Labs on Admission:  I have personally reviewed following labs and imaging studies Results for orders placed or performed during the hospital encounter of 04/02/20 (from the past 24 hour(s))  CBC with Differential     Status: Abnormal   Collection Time: 04/02/20  1:25 PM  Result Value Ref Range   WBC 6.6 4.0 - 10.5 K/uL   RBC 4.59 3.87 - 5.11 MIL/uL   Hemoglobin 12.7 12.0 - 15.0 g/dL   HCT 38.6 36 - 46 %   MCV 84.1 80.0 - 100.0 fL   MCH 27.7 26.0 - 34.0 pg   MCHC 32.9 30.0 - 36.0 g/dL   RDW 16.3 (H) 11.5 - 15.5 %   Platelets 205 150 - 400 K/uL   nRBC 0.5 (H) 0.0 - 0.2 %   Neutrophils Relative % 88 %   Neutro Abs 5.8 1.7 - 7.7 K/uL   Lymphocytes Relative 5 %   Lymphs Abs 0.3 (L) 0.7 - 4.0 K/uL   Monocytes Relative 5 %   Monocytes Absolute 0.4 0.1 - 1.0 K/uL   Eosinophils Relative 0 %   Eosinophils Absolute 0.0 0.0 - 0.5 K/uL   Basophils Relative 0 %   Basophils Absolute 0.0 0.0 - 0.1 K/uL   Immature Granulocytes 2 %   Abs Immature Granulocytes 0.15 (H) 0.00 - 0.07 K/uL  Basic metabolic panel     Status: Abnormal   Collection Time: 04/02/20  1:25 PM  Result Value Ref Range   Sodium 139 135 - 145 mmol/L   Potassium 3.1 (L) 3.5 - 5.1 mmol/L   Chloride 101 98 - 111 mmol/L   CO2 25 22 - 32 mmol/L   Glucose, Bld 153 (H) 70 - 99 mg/dL   BUN 24 (H) 8 - 23 mg/dL   Creatinine, Ser 0.77 0.44 - 1.00 mg/dL   Calcium 9.1 8.9 - 10.3 mg/dL   GFR, Estimated >60 >60  mL/min   Anion gap 13 5 - 15     Radiological Exams on Admission: CT ABDOMEN PELVIS W CONTRAST  Result Date: 04/02/2020 CLINICAL DATA:  Lower abdominal pain for almost 2 months with worsening since last night. Nausea, vomiting and diarrhea. 25 pound weight loss. History of anal cancer status post radiation therapy in February 2021 with completion of chemotherapy 03/07/2020. EXAM: CT ABDOMEN AND PELVIS WITH CONTRAST TECHNIQUE: Multidetector CT imaging of the abdomen and pelvis was performed using the standard protocol following bolus administration of intravenous contrast. CONTRAST:  146mL OMNIPAQUE IOHEXOL 300 MG/ML  SOLN COMPARISON:  03/28/2020 CT abdomen/pelvis. FINDINGS: Lower chest: Posterior right lower lobe 3 mm solid pulmonary nodule (series 6/image 11), unchanged from 03/28/2020 CT abdomen study. Hepatobiliary: Normal liver size. No liver mass. Normal gallbladder with no radiopaque cholelithiasis. No biliary ductal dilatation. Pancreas: Normal, with no mass or duct dilation. Spleen: Normal size. No mass. Adrenals/Urinary Tract: Normal adrenals. Moderate right hydroureteronephrosis to the level of distal right pelvic ureter, similar to slightly worsened since 03/28/2020 CT. Urothelial wall thickening and hyperenhancement in the distal 2-3 cm of the right pelvic ureter, similar. No left hydronephrosis. Normal caliber left ureter. Moderate renal cortical scarring throughout the upper right kidney is unchanged. Subcentimeter hypodense renal cortical lesion in the anterior interpolar right kidney is too small to characterize and unchanged. No new renal lesions. Tiny focus of gas in the nondependent bladder. No definite bladder wall thickening. Small calcification in the anterior midline bladder wall is unchanged. Stomach/Bowel: Small hiatal hernia. Otherwise normal nondistended stomach. Long segment of  moderate contiguous wall thickening in the distal and terminal ileum with associated mucosal  hyperenhancement and surrounding mesenteric fat stranding and ill-defined fluid, similar to slightly improved in the interval. No new sites of small bowel wall thickening. No small bowel dilatation. Appendectomy. No definite large bowel wall thickening. No colonic diverticulosis. No acute pericolonic fat stranding. Large bowel is largely collapsed. Vascular/Lymphatic: Atherosclerotic nonaneurysmal abdominal aorta. Patent portal, splenic, hepatic and renal veins. Retroaortic left renal vein. No pathologically enlarged lymph nodes in the abdomen or pelvis. Reproductive: Status post hysterectomy, with no mass at the vaginal cuff. No adnexal mass. Other: No pneumoperitoneum. No focal fluid collections. Small volume free fluid in the pelvic cul-de-sac is similar. Musculoskeletal: No aggressive appearing focal osseous lesions. Moderate thoracolumbar spondylosis. IMPRESSION: 1. CT study is fairly similar to the CT study performed 5 days prior. 2. Nonspecific distal and terminal ileitis, potentially treatment related, similar to slightly improved. No bowel obstruction. No free air. 3. Moderate right hydroureteronephrosis to the level of the distal right pelvic ureter, similar to slightly worsened. Nonspecific urothelial wall thickening and hyperenhancement in the distal 2-3 cm of the right pelvic ureter, similar, probably inflammatory. Consider urology consultation. 4. Tiny focus of gas in the nondependent bladder, presumably due to recent bladder instrumentation. Correlate with urinalysis as clinically warranted to exclude acute cystitis. 5. Stable small volume free fluid in the pelvic cul-de-sac. 6. No lymphadenopathy or other findings suspicious for metastatic disease in the abdomen or pelvis. Right lung base 3 mm solid pulmonary nodule is stable and warrants continued chest CT follow-up. 7. Small hiatal hernia. 8. Aortic Atherosclerosis (ICD10-I70.0). Electronically Signed   By: Ilona Sorrel M.D.   On: 04/02/2020 17:21     CT ABDOMEN PELVIS W CONTRAST  Final Result      Consults called:  Urology Oncology    Dwyane Dee, MD [facility] 04/02/2020, 5:59 PM

## 2020-04-02 NOTE — Progress Notes (Signed)
I reviewed the CT scan and the report.  She has inflammation of the terminal ileum.  Hydroureter on the right approximately the same with inflammation distal right ureter or pelvic ureter.  I am not convinced the stent will help and even a percutaneous tube could be considered.  The location of her pain and overall clinical presentation was in keeping with the inflamed bowel treated with prednisone.  I suggest to keep her n.p.o. at midnight and will see her again in the morning.  Daily a decision for stent or percutaneous tube can be entertained.  Her primary pain is very periumbilical and diffuse in the lower abdomen and almost in a straight line up and down her spine.  Serum creatinine is normal and improved.  No elevated white blood count.  Afebrile  Spoke to Dr Alinda Money and agreed with plan

## 2020-04-02 NOTE — Progress Notes (Signed)
HEMATOLOGY-ONCOLOGY PROGRESS NOTE  SUBJECTIVE: The patient was discharged to home yesterday from hospital, developed worsening abdominal pain, nausea last night.  She did take morphine but did not help.  She did not sleep last night.  She called my office this morning, and saw urologist in the office, who felt her abdominal pain was related to colitis.  She was sent to emergency room for further evaluation.   Oncology History Overview Note  Cancer Staging No matching staging information was found for the patient.    Squamous cell cancer, anus ([place])  07/13/2019 Procedure    EUA, Excision of posterior internal/external hemorrhoid under the care of Dr. Hassell Done    07/13/2019 Pathology Results   - Invasive moderately differentiated squamous cell carcinoma, 1.4 cm.  See comment  - Carcinoma invades for depth of 0.4 cm  - Deep resection margin is negative for carcinoma (0.2 cm)  - Lateral mucosal margin is positive for high-grade dysplasia  - No evidence of lymphovascular perineural invasion  Procedure: Local excision  Tumor Site: Anal canal  Tumor Size: 1.4 cm  Histologic Type: Invasive squamous cell carcinoma  Histologic Grade: G2: Moderately differentiated  Tumor Extension: Carcinoma invades superficial anal sphincter muscle  Margins: Uninvolved by tumor  Treatment Effect: N/A  Regional Lymph Nodes: No lymph nodes submitted or found  Pathologic Stage Classification (pTNM, AJCC 8th Edition):  pT1, pNx  Representative Tumor Block: A1  Comment(s): Lateral mucosal margin is positive for high-grade squamous  dysplasia      07/13/2019 Initial Diagnosis   Squamous cell cancer, anus ([place])   09/04/2019 Imaging   CT scan Chest, Abdomen, and Pelvis   IMPRESSION: 1. No evidence of metastatic disease in the chest, abdomen or pelvis. 2. No discrete anorectal mass. 3. Chronic findings include: Punctate nonobstructing upper right renal stones and chronic right renal scarring. 4. Aortic  Atherosclerosis (ICD10-I70.0).   01/10/2020 Pathology Results   A. ANAL LESION, POSTERIOR MIDLINE, EXCISION:  - Squamous cell carcinoma, moderately differentiated.  Verbally reported by Dr. Saralyn Pilar 1.5 cm, negative margins, depth <1 mm   01/11/2020 Procedure   1. Excision of anal canal lesion (posterior midline) under the care of Dr. Dema Severin     01/28/2020 PET scan   IMPRESSION: 1. Mild focal anal hypermetabolism without discrete mass correlate on the CT images, nonspecific, differential includes postsurgical change or residual anal tumor. 2. No hypermetabolic locoregional or distant metastatic disease. 3. Chronic findings include: Aortic Atherosclerosis (ICD10-I70.0). Diffuse hepatic steatosis. Nonobstructing right nephrolithiasis.     02/04/2020 - 03/07/2020 Radiation Therapy   concurrent chemoRT by Dr Lisbeth Renshaw with Mitomycin and 5FU starting 02/04/20-03/07/20. The last 4 session cancelled due to poor toleration.    02/04/2020 - 03/03/2020 Chemotherapy   Concurrent chemoRT with Mitomycin and 5FU on week 1 and 5 starting 02/04/20-03/03/20   03/13/2020 Imaging   CT AP W contrast  IMPRESSION: Diffuse wall thickening with inflammatory changes involving a long segment of distal ileum, cecum, and sigmoid colon likely represents enteritis/colitis. This could be due to infectious, or inflammatory nature, not thought to represent ischemic colitis.   Hepatic steatosis   Trace pericardial effusion   Small free fluid in the deep pelvis     I have reviewed the past medical history, past surgical history, social history and family history with the patient and they are unchanged from previous note.   PHYSICAL EXAMINATION: ECOG PERFORMANCE STATUS: 3 - Symptomatic, >50% confined to bed  Vitals:   04/02/20 1800 04/02/20 1814  BP: 135/80 135/80  Pulse: 77 81  Resp: 15 13  Temp:  97.7 F (36.5 C)  SpO2: 95% 98%   There were no vitals filed for this visit.  Intake/Output from previous  day: No intake/output data recorded.  GENERAL:alert, no distress and comfortable SKIN: skin color, texture, turgor are normal, no rashes or significant lesions LUNGS: clear to auscultation and percussion with normal breathing effort HEART: regular rate & rhythm and no murmurs and no lower extremity edema ABDOMEN:abdomen soft, non-tender and normal bowel sounds NEURO: alert & oriented x 3 with fluent speech, no focal motor/sensory deficits  LABORATORY DATA:  I have reviewed the data as listed CMP Latest Ref Rng & Units 04/02/2020 03/29/2020 03/27/2020  Glucose 70 - 99 mg/dL 153(H) 253(H) 86  BUN 8 - 23 mg/dL 24(H) 6(L) 9  Creatinine 0.44 - 1.00 mg/dL 0.77 0.68 0.91  Sodium 135 - 145 mmol/L 139 140 138  Potassium 3.5 - 5.1 mmol/L 3.1(L) 3.9 3.6  Chloride 98 - 111 mmol/L 101 111 103  CO2 22 - 32 mmol/L 25 23 26   Calcium 8.9 - 10.3 mg/dL 9.1 8.8(L) 8.6(L)  Total Protein 6.5 - 8.1 g/dL - 5.7(L) -  Total Bilirubin 0.3 - 1.2 mg/dL - 0.5 -  Alkaline Phos 38 - 126 U/L - 53 -  AST 15 - 41 U/L - 14(L) -  ALT 0 - 44 U/L - 13 -    Lab Results  Component Value Date   WBC 6.6 04/02/2020   HGB 12.7 04/02/2020   HCT 38.6 04/02/2020   MCV 84.1 04/02/2020   PLT 205 04/02/2020   NEUTROABS 5.8 04/02/2020    CT ABDOMEN PELVIS W CONTRAST  Result Date: 04/02/2020 CLINICAL DATA:  Lower abdominal pain for almost 2 months with worsening since last night. Nausea, vomiting and diarrhea. 25 pound weight loss. History of anal cancer status post radiation therapy in February 2021 with completion of chemotherapy 03/07/2020. EXAM: CT ABDOMEN AND PELVIS WITH CONTRAST TECHNIQUE: Multidetector CT imaging of the abdomen and pelvis was performed using the standard protocol following bolus administration of intravenous contrast. CONTRAST:  183mL OMNIPAQUE IOHEXOL 300 MG/ML  SOLN COMPARISON:  03/28/2020 CT abdomen/pelvis. FINDINGS: Lower chest: Posterior right lower lobe 3 mm solid pulmonary nodule (series 6/image  11), unchanged from 03/28/2020 CT abdomen study. Hepatobiliary: Normal liver size. No liver mass. Normal gallbladder with no radiopaque cholelithiasis. No biliary ductal dilatation. Pancreas: Normal, with no mass or duct dilation. Spleen: Normal size. No mass. Adrenals/Urinary Tract: Normal adrenals. Moderate right hydroureteronephrosis to the level of distal right pelvic ureter, similar to slightly worsened since 03/28/2020 CT. Urothelial wall thickening and hyperenhancement in the distal 2-3 cm of the right pelvic ureter, similar. No left hydronephrosis. Normal caliber left ureter. Moderate renal cortical scarring throughout the upper right kidney is unchanged. Subcentimeter hypodense renal cortical lesion in the anterior interpolar right kidney is too small to characterize and unchanged. No new renal lesions. Tiny focus of gas in the nondependent bladder. No definite bladder wall thickening. Small calcification in the anterior midline bladder wall is unchanged. Stomach/Bowel: Small hiatal hernia. Otherwise normal nondistended stomach. Long segment of moderate contiguous wall thickening in the distal and terminal ileum with associated mucosal hyperenhancement and surrounding mesenteric fat stranding and ill-defined fluid, similar to slightly improved in the interval. No new sites of small bowel wall thickening. No small bowel dilatation. Appendectomy. No definite large bowel wall thickening. No colonic diverticulosis. No acute pericolonic fat stranding. Large bowel is largely collapsed. Vascular/Lymphatic: Atherosclerotic nonaneurysmal abdominal  aorta. Patent portal, splenic, hepatic and renal veins. Retroaortic left renal vein. No pathologically enlarged lymph nodes in the abdomen or pelvis. Reproductive: Status post hysterectomy, with no mass at the vaginal cuff. No adnexal mass. Other: No pneumoperitoneum. No focal fluid collections. Small volume free fluid in the pelvic cul-de-sac is similar. Musculoskeletal:  No aggressive appearing focal osseous lesions. Moderate thoracolumbar spondylosis. IMPRESSION: 1. CT study is fairly similar to the CT study performed 5 days prior. 2. Nonspecific distal and terminal ileitis, potentially treatment related, similar to slightly improved. No bowel obstruction. No free air. 3. Moderate right hydroureteronephrosis to the level of the distal right pelvic ureter, similar to slightly worsened. Nonspecific urothelial wall thickening and hyperenhancement in the distal 2-3 cm of the right pelvic ureter, similar, probably inflammatory. Consider urology consultation. 4. Tiny focus of gas in the nondependent bladder, presumably due to recent bladder instrumentation. Correlate with urinalysis as clinically warranted to exclude acute cystitis. 5. Stable small volume free fluid in the pelvic cul-de-sac. 6. No lymphadenopathy or other findings suspicious for metastatic disease in the abdomen or pelvis. Right lung base 3 mm solid pulmonary nodule is stable and warrants continued chest CT follow-up. 7. Small hiatal hernia. 8. Aortic Atherosclerosis (ICD10-I70.0). Electronically Signed   By: Ilona Sorrel M.D.   On: 04/02/2020 17:21   CT ABDOMEN PELVIS W CONTRAST  Result Date: 03/28/2020 CLINICAL DATA:  Acute nonlocalized abdominal pain EXAM: CT ABDOMEN AND PELVIS WITH CONTRAST TECHNIQUE: Multidetector CT imaging of the abdomen and pelvis was performed using the standard protocol following bolus administration of intravenous contrast. CONTRAST:  173mL OMNIPAQUE IOHEXOL 300 MG/ML  SOLN COMPARISON:  Two days ago FINDINGS: Lower chest:  Trace pericardial fluid, stable. Hepatobiliary: No focal liver abnormality.No evidence of biliary obstruction or stone. Pancreas: Unremarkable. Spleen: Unremarkable. Adrenals/Urinary Tract: Negative adrenals. Progressive right hydroureteronephrosis despite passage of a stone there is ureteral thickening at the UVJ. No left hydronephrosis. Right renal cortical scarring  affecting the upper pole. Punctate upper pole calculus on the right. Gas in the urinary bladder. Stomach/Bowel: Distal ileitis with submucosal low-density edema that is stable if not progressed. Small volume ascites is likely related. Appendectomy. History of anal cancer. No visible in a rectal mass. Vascular/Lymphatic: No acute vascular abnormality. Diffuse atheromatous plaque involving the aorta and iliacs. No mass or adenopathy. Reproductive:Hysterectomy. Other: Small volume reactive appearing ascites in the pelvis Musculoskeletal: No acute abnormalities. IMPRESSION: 1. Passed right ureteral calculus but progressive right hydroureteronephrosis in the setting of lower right ureteral thickening. 2. Ongoing distal ileitis which could be infectious, inflammatory, or radiation related. 3. Cortical scarring and small calculus of the right kidney. Electronically Signed   By: Monte Fantasia M.D.   On: 03/28/2020 07:14   CT ABDOMEN PELVIS W CONTRAST  Result Date: 03/26/2020 CLINICAL DATA:  Increasing abdominal pain since recent discharge EXAM: CT ABDOMEN AND PELVIS WITH CONTRAST TECHNIQUE: Multidetector CT imaging of the abdomen and pelvis was performed using the standard protocol following bolus administration of intravenous contrast. CONTRAST:  167mL OMNIPAQUE IOHEXOL 300 MG/ML  SOLN COMPARISON:  03/13/2020 FINDINGS: Lower chest: No acute abnormality. Hepatobiliary: Probable hepatic steatosis. No focal liver lesion. Gallbladder is unremarkable. No biliary dilatation. Pancreas: Unremarkable. Spleen: Unremarkable. Adrenals/Urinary Tract: Adrenals unremarkable. Punctate nonobstructing calculus of the upper pole the right kidney. There is right upper pole cortical scarring. Mild right hydroureteronephrosis. This is secondary to an obstructing 3 mm calculus of the distal ureter, which was present on the prior study and has moved slightly inferiorly. Bladder  is unremarkable. Stomach/Bowel: Stomach is within normal  limits. There is persistent wall thickening involving the terminal ileum and adjacent distal ileum. Decreased cecal wall thickening. No bowel obstruction. Vascular/Lymphatic: Aortic atherosclerosis. No enlarged lymph nodes identified. Reproductive: Status post hysterectomy. No adnexal masses. Other: Small volume abdominopelvic free fluid. Abdominal wall is unremarkable. Musculoskeletal: No acute osseous abnormality. IMPRESSION: New mild right hydroureteronephrosis. Secondary to an obstructing 3 mm distal right ureteral calculus. This was present on the prior study and has moved slightly inferiorly/distally. Residual or recurrent infectious/inflammatory changes of the distal ileum. Decreased cecal wall thickening. Electronically Signed   By: Macy Mis M.D.   On: 03/26/2020 09:39   CT ABDOMEN PELVIS W CONTRAST  Result Date: 03/13/2020 CLINICAL DATA:  Abdominal pain and fever, history of squamous cell EXAM: CT ABDOMEN AND PELVIS WITH CONTRAST TECHNIQUE: Multidetector CT imaging of the abdomen and pelvis was performed using the standard protocol following bolus administration of intravenous contrast. CONTRAST:  174mL OMNIPAQUE IOHEXOL 300 MG/ML  SOLN COMPARISON:  September 04, 2019 FINDINGS: Lower chest: The visualized heart size within normal limits. There is a trace pericardial effusion. No hiatal hernia. The visualized portions of the lungs are clear. Hepatobiliary: There is diffuse low density seen throughout the liver parenchyma. No focal hepatic lesion is seen. No intra or extrahepatic biliary ductal dilatation. The main portal vein is patent. No evidence of calcified gallstones, gallbladder wall thickening or biliary dilatation. Pancreas: Unremarkable. No pancreatic ductal dilatation or surrounding inflammatory changes. Spleen: Normal in size without focal abnormality. Adrenals/Urinary Tract: Both adrenal glands appear normal. There is a punctate calcifications seen in the upper pole of the right kidney.  There is areas of scarring seen within the upper pole. A 1 cm low-density lesion seen in the lower pole the right kidney. No left-sided renal or collecting system calculi. There is mild diffuse bladder wall thickening. Stomach/Bowel: The stomach and proximal small bowel are unremarkable. There is a long segment of ileum with diffuse wall thickening surrounding fat stranding changes to the terminal ileum. There is also wall thickening seen around the distal cecal pole. Mild wall thickening with surrounding fat stranding changes are seen around the sigmoid colon extending to the sigmoid rectal junction. No loculated fluid collections or free air however are noted. Vascular/Lymphatic: There are no enlarged mesenteric, retroperitoneal, or pelvic lymph nodes. Scattered aortic atherosclerotic calcifications are seen without aneurysmal dilatation. Reproductive: The patient is status post hysterectomy. No adnexal masses or collections seen. Other: A small amount of free fluid is seen within the deep cul-de-sac. Musculoskeletal: No acute or significant osseous findings. IMPRESSION: Diffuse wall thickening with inflammatory changes involving a long segment of distal ileum, cecum, and sigmoid colon likely represents enteritis/colitis. This could be due to infectious, or inflammatory nature, not thought to represent ischemic colitis. Hepatic steatosis Trace pericardial effusion Small free fluid in the deep pelvis Electronically Signed   By: Prudencio Pair M.D.   On: 03/13/2020 21:30   DG C-Arm 1-60 Min-No Report  Result Date: 03/26/2020 Fluoroscopy was utilized by the requesting physician.  No radiographic interpretation.    ASSESSMENT AND PLAN: 1.  Recurrent abdominal pain and nausea 2. Recurrent squamous cell carcinoma of the anus, pT1 N0 M0 3.  Obstructing right ureteral calculus with hydroureteronephrosis 4.  Ileitis and colitis  5. Anxiety and depression    Plan -I reviewed her CT abdomen pelvis with  contrast which was obtained in the ED today, which showed stable/slightly improved distal ileitis.  Moderate hydroureteronephrosis, stable to  slightly worse  -It's hard to say if her abdominal pain was related to distal ileitis versus vs recent right kidney stone related hydroureteronephrosis, will wait urology input -I agree with supportive care, IV fluids, pain management, and low-dose IV Solu-Medrol (60-80mg )  -I will f/u    LOS: 0 days   Truitt Merle, MD 04/02/20  .

## 2020-04-02 NOTE — Hospital Course (Addendum)
Ms. Melinda [name] is a 59 yo CF with PMH recurrent anal SCC (most recently treated with chemoradiation with mitomycin and 5-FU, last treatment 03/07/2020), DMII, HLD who presents to the ER with recurrent abdominal pain and nausea/vomiting at home. [name].  She was previously treated for radiation induced colitis earlier in October and a UTI.  Her most recent hospitalization was from 03/26/2020 to 04/01/2020 for obstructed right ureteral calculus with hydroureteronephrosis.  She underwent ureteroscopy with stone removal and laser lithotripsy and cystoscopy.  Distal ureteral stent was removed as kidney was noted to be draining well on the right side.  During that hospitalization she was treated with IV steroids, fluids, and pain medication.  She was not discharged with oral steroids due to anxiety associated with them.  She was also noted to have ileitis in the distal ileum.  She again presented to the ER with recurrent abdominal pain.  She states that on Tuesday she took some Colace then has 6 very soft/diarrheal bowel movements.  She then took some Imodium afterwards.  She states that she ate chocolate mousse at night which upset her stomach causing her to vomit.  Due to her recurrent abdominal pain she presented to the ER for evaluation once again.  She underwent repeat CT abdomen/pelvis on evaluation in the ER.  There was again noted to be distal and terminal ileitis, potentially treatment related.  There was moderate right hydroureteronephrosis to the level of the distal right pelvic ureter "similar to slightly worsened".  "Nonspecific urothelial wall thickening and hyperenhancement in the distal 2 to 3 cm of the right pelvic ureter, similar, probably inflammatory."  She was treated with Dilaudid, LR bolus, and Zofran in the ER.  She is admitted for further evaluation and repeat consultation with urology.  Her pain seemed to localize to her RLQ as hospitalization continued.  She had many reservations about nephrostomy tube placement.  Despite ongoing steroids and conservative measures her pain persisted. She underwent a trial of vaginal valium and toradol on 10/30.   She had a repeat CT performed on 10/31 which showed improvement in ileitis and stable/slight improvement in the right ureter/collecting system. Due to this patient was comfortable with ongoing conservative and monitoring treatment.   She had also been started on cefepime for Pseudomonas UTI treatment given significant symptoms of dysuria when culture began growing.  Plan is for 7-day treatment course per urology recommendations as well.  She completed approximately 3 days IV cefepime inpatient and due to her allergy profile and resistance pattern noted on culture data (after discussion with ID pharmacist) she was deescalated to ciprofloxacin for monitoring over 24 hour period, and if tolerates, would be considered stable for discharging home to finish completing course.

## 2020-04-02 NOTE — Progress Notes (Signed)
Patient arrives to room 1611 at this time via stretcher from ED.  Patient independent from stretcher to bed.  Oriented to callbell use and bed controls with stated understanding

## 2020-04-02 NOTE — Assessment & Plan Note (Addendum)
-   continue SSI and CBG monitoring  

## 2020-04-02 NOTE — ED Triage Notes (Signed)
Pt arrived via walk in, c/o bilateral lower abd pain and vomitting x2 days, states this has been ongoing, worsening this morning. States she has colitis, CA pt, last radiation/chemo tx 10/1

## 2020-04-03 LAB — CBC WITH DIFFERENTIAL/PLATELET
Abs Immature Granulocytes: 0.11 10*3/uL — ABNORMAL HIGH (ref 0.00–0.07)
Basophils Absolute: 0 10*3/uL (ref 0.0–0.1)
Basophils Relative: 0 %
Eosinophils Absolute: 0 10*3/uL (ref 0.0–0.5)
Eosinophils Relative: 1 %
HCT: 31.4 % — ABNORMAL LOW (ref 36.0–46.0)
Hemoglobin: 10 g/dL — ABNORMAL LOW (ref 12.0–15.0)
Immature Granulocytes: 4 %
Lymphocytes Relative: 8 %
Lymphs Abs: 0.2 10*3/uL — ABNORMAL LOW (ref 0.7–4.0)
MCH: 27.2 pg (ref 26.0–34.0)
MCHC: 31.8 g/dL (ref 30.0–36.0)
MCV: 85.6 fL (ref 80.0–100.0)
Monocytes Absolute: 0.1 10*3/uL (ref 0.1–1.0)
Monocytes Relative: 4 %
Neutro Abs: 2.4 10*3/uL (ref 1.7–7.7)
Neutrophils Relative %: 83 %
Platelets: 154 10*3/uL (ref 150–400)
RBC: 3.67 MIL/uL — ABNORMAL LOW (ref 3.87–5.11)
RDW: 16.3 % — ABNORMAL HIGH (ref 11.5–15.5)
WBC: 2.9 10*3/uL — ABNORMAL LOW (ref 4.0–10.5)
nRBC: 0 % (ref 0.0–0.2)

## 2020-04-03 LAB — BASIC METABOLIC PANEL
Anion gap: 8 (ref 5–15)
BUN: 16 mg/dL (ref 8–23)
CO2: 24 mmol/L (ref 22–32)
Calcium: 8.4 mg/dL — ABNORMAL LOW (ref 8.9–10.3)
Chloride: 105 mmol/L (ref 98–111)
Creatinine, Ser: 0.56 mg/dL (ref 0.44–1.00)
GFR, Estimated: >60 mL/min (ref >60)
Glucose, Bld: 200 mg/dL — ABNORMAL HIGH (ref 70–99)
Potassium: 3.7 mmol/L (ref 3.5–5.1)
Sodium: 137 mmol/L (ref 135–145)

## 2020-04-03 LAB — GLUCOSE, CAPILLARY
Glucose-Capillary: 135 mg/dL — ABNORMAL HIGH (ref 70–99)
Glucose-Capillary: 183 mg/dL — ABNORMAL HIGH (ref 70–99)
Glucose-Capillary: 151 mg/dL — ABNORMAL HIGH (ref 70–99)
Glucose-Capillary: 135 mg/dL — ABNORMAL HIGH (ref 70–99)
Glucose-Capillary: 117 mg/dL — ABNORMAL HIGH (ref 70–99)
Glucose-Capillary: 188 mg/dL — ABNORMAL HIGH (ref 70–99)

## 2020-04-03 LAB — MAGNESIUM: Magnesium: 2.2 mg/dL (ref 1.7–2.4)

## 2020-04-03 MED ORDER — PHENAZOPYRIDINE HCL 100 MG PO TABS
100.0000 mg | ORAL_TABLET | Freq: Three times a day (TID) | ORAL | Status: DC | PRN
  Administered 2020-04-03 – 2020-04-05 (×4): 100 mg via ORAL
  Filled 2020-04-03 (×5): qty 1

## 2020-04-03 MED ORDER — SODIUM CHLORIDE 0.9 % IV SOLN: INTRAVENOUS | Status: DC

## 2020-04-03 MED ORDER — ROSUVASTATIN CALCIUM 5 MG PO TABS
5.0000 mg | ORAL_TABLET | Freq: Every day | ORAL | Status: DC
  Administered 2020-04-03 – 2020-04-08 (×6): 5 mg via ORAL
  Filled 2020-04-03 (×7): qty 1

## 2020-04-03 NOTE — Progress Notes (Signed)
PROGRESS NOTE    Melinda [name]   ULA:[id number]  DOB: 03-20-1961  DOA: 04/02/2020     1  PCP: Crist Infante, MD  CC: abdominal pain  Hospital Course: Ms. Melinda [name] is a 59 yo CF with PMH recurrent anal SCC (most recently treated with chemoradiation with mitomycin and 5-FU, last treatment 03/07/2020), DMII, HLD who presents to the ER with recurrent abdominal pain and nausea/vomiting at home. [name].  She was previously treated for radiation induced colitis earlier in October and a UTI.  Her most recent hospitalization was from 03/26/2020 to 04/01/2020 for obstructed right ureteral calculus with hydroureteronephrosis.  She underwent ureteroscopy with stone removal and laser lithotripsy and cystoscopy.  Distal ureteral stent was removed as kidney was noted to be draining well on the right side.  During that hospitalization she was treated with IV steroids, fluids, and pain medication.  She was not discharged with oral steroids due to anxiety associated with them.  She was also noted to have ileitis in the distal ileum.  She again presents to the ER with recurrent abdominal pain.  She states that on Tuesday she took some Colace then has 6 very soft/diarrheal bowel movements.  She then took some Imodium afterwards.  She states that she ate chocolate mousse at night which upset her stomach causing her to vomit.  Due to her recurrent abdominal pain she presented to the ER for evaluation once again.  She underwent repeat CT abdomen/pelvis on evaluation in the ER.  There was again noted to be distal and terminal ileitis, potentially treatment related.  There was moderate right hydroureteronephrosis to the level of the distal right pelvic ureter "similar to slightly worsened".  "Nonspecific urothelial wall thickening and hyperenhancement in the distal 2 to 3 cm of the right pelvic ureter, similar, probably inflammatory."  She was treated with Dilaudid, LR bolus, and Zofran  in the ER.  She is admitted for further evaluation and repeat consultation with urology.   Interval History:  Abdominal pain seems to be a little better today. Still located in RLQ at the worst area.  No vomiting, slight nausea but overall better.  Pain medication working she says. Voiding well also and no diarrhea.   Old records reviewed in assessment of this patient  ROS: Constitutional: negative for chills and fevers, Respiratory: negative for cough, Cardiovascular: negative for chest pain and Gastrointestinal: positive for abdominal pain  Assessment & Plan: * Abdominal pain - differential for etiology includes terminal ileitis (?Radiation-associated) versus right ureteral thickening with associated hydroureteronephrosis -Appreciate urology consult.  Agree that abdominal pain is more diffuse at this time which may be due to discontinuation of steroid at recent discharge; patient may need prolonged taper - so far her abdominal pain is improved today, 10/28 but still present most in RLQ. Steroid may have helped on admission - if worsens may consider surgical consult -keep n.p.o. until seen by urology - continue IVF  -Treat pain with morphine; nausea with Zofran -Restart IV Solu-Medrol, 60 mg every 8 hours and will taper.  Discussed with her that she may need oral outpatient course if recommended (could possibly do trial inpatient to see what oral regimen she could tolerate going home on)  Hydroureteronephrosis -unclear if contributing to abdominal pain; recent stone and stent removal; CT shows nonspecific urothelial wall thickening and hyperenhancement of the distal 2 to 3 cm of the right pelvic ureter -Follow-up urology recommendations in the morning  Recurrent anal squamous cell  carcinoma [facility]) - most recently treated with chemoradiation with mitomycin and 5-FU, last treatment 03/07/2020 -Follows with Dr. Burr Medico; appreciate assistance as well in the management   Diabetes mellitus  ([place]) - continue SSI and CBG monitoring - q4h while NPO    Antimicrobials: none  DVT prophylaxis: Lovenox  Code Status: Full Family Communication: none present Disposition Plan: Status is: Inpatient  Remains inpatient appropriate because:Ongoing diagnostic testing needed not appropriate for outpatient work up, Unsafe d/c plan, IV treatments appropriate due to intensity of illness or inability to take PO and Inpatient level of care appropriate due to severity of illness   Dispo: The patient is from: Home              Anticipated d/c is to: Home              Anticipated d/c date is: 2 days              Patient currently is not medically stable to d/c.       Objective: Blood pressure 122/67, pulse 79, temperature 98.2 F (36.8 C), temperature source Oral, resp. rate 18, SpO2 93 %.  Examination: General appearance: Pleasant adult woman lying in bed appearing uncomfortable but in no obvious distress Head: Normocephalic, without obvious abnormality, atraumatic Eyes: EOMI Lungs: clear to auscultation bilaterally Heart: regular rate and rhythm and S1, S2 normal Abdomen: Soft.  Still TTP in RLQ but improved from previous exam. No R/G. BS present more Extremities: No edema Skin: mobility and turgor normal Neurologic: Grossly normal  Consultants:   Urology  Oncology   Procedures:   none  Data Reviewed: I have personally reviewed following labs and imaging studies Results for orders placed or performed during the hospital encounter of 04/02/20 (from the past 24 hour(s))  Respiratory Panel by RT PCR (Flu A&B, Covid) - Nasopharyngeal Swab     Status: None   Collection Time: 04/02/20  4:51 PM   Specimen: Nasopharyngeal Swab  Result Value Ref Range   SARS Coronavirus 2 by RT PCR NEGATIVE NEGATIVE   Influenza A by PCR NEGATIVE NEGATIVE   Influenza B by PCR NEGATIVE NEGATIVE  Urinalysis, Routine w reflex microscopic Urine, Clean Catch     Status: Abnormal   Collection  Time: 04/02/20  6:58 PM  Result Value Ref Range   Color, Urine YELLOW YELLOW   APPearance CLEAR CLEAR   Specific Gravity, Urine 1.044 (H) 1.005 - 1.030   pH 6.0 5.0 - 8.0   Glucose, UA NEGATIVE NEGATIVE mg/dL   Hgb urine dipstick MODERATE (A) NEGATIVE   Bilirubin Urine NEGATIVE NEGATIVE   Ketones, ur NEGATIVE NEGATIVE mg/dL   Protein, ur NEGATIVE NEGATIVE mg/dL   Nitrite NEGATIVE NEGATIVE   Leukocytes,Ua NEGATIVE NEGATIVE   RBC / HPF 21-50 0 - 5 RBC/hpf   WBC, UA 6-10 0 - 5 WBC/hpf   Bacteria, UA NONE SEEN NONE SEEN   Squamous Epithelial / LPF 0-5 0 - 5   Mucus PRESENT   Glucose, capillary     Status: None   Collection Time: 04/02/20  8:51 PM  Result Value Ref Range   Glucose-Capillary 98 70 - 99 mg/dL  Glucose, capillary     Status: Abnormal   Collection Time: 04/02/20 11:54 PM  Result Value Ref Range   Glucose-Capillary 135 (H) 70 - 99 mg/dL  Glucose, capillary     Status: Abnormal   Collection Time: 04/03/20  4:03 AM  Result Value Ref Range   Glucose-Capillary 183 (H) 70 -  99 mg/dL  Basic metabolic panel     Status: Abnormal   Collection Time: 04/03/20  4:17 AM  Result Value Ref Range   Sodium 137 135 - 145 mmol/L   Potassium 3.7 3.5 - 5.1 mmol/L   Chloride 105 98 - 111 mmol/L   CO2 24 22 - 32 mmol/L   Glucose, Bld 200 (H) 70 - 99 mg/dL   BUN 16 8 - 23 mg/dL   Creatinine, Ser 0.56 0.44 - 1.00 mg/dL   Calcium 8.4 (L) 8.9 - 10.3 mg/dL   GFR, Estimated >60 >60 mL/min   Anion gap 8 5 - 15  CBC with Differential/Platelet     Status: Abnormal   Collection Time: 04/03/20  4:17 AM  Result Value Ref Range   WBC 2.9 (L) 4.0 - 10.5 K/uL   RBC 3.67 (L) 3.87 - 5.11 MIL/uL   Hemoglobin 10.0 (L) 12.0 - 15.0 g/dL   HCT 31.4 (L) 36 - 46 %   MCV 85.6 80.0 - 100.0 fL   MCH 27.2 26.0 - 34.0 pg   MCHC 31.8 30.0 - 36.0 g/dL   RDW 16.3 (H) 11.5 - 15.5 %   Platelets 154 150 - 400 K/uL   nRBC 0.0 0.0 - 0.2 %   Neutrophils Relative % 83 %   Neutro Abs 2.4 1.7 - 7.7 K/uL    Lymphocytes Relative 8 %   Lymphs Abs 0.2 (L) 0.7 - 4.0 K/uL   Monocytes Relative 4 %   Monocytes Absolute 0.1 0.1 - 1.0 K/uL   Eosinophils Relative 1 %   Eosinophils Absolute 0.0 0.0 - 0.5 K/uL   Basophils Relative 0 %   Basophils Absolute 0.0 0.0 - 0.1 K/uL   Immature Granulocytes 4 %   Abs Immature Granulocytes 0.11 (H) 0.00 - 0.07 K/uL  Magnesium     Status: None   Collection Time: 04/03/20  4:17 AM  Result Value Ref Range   Magnesium 2.2 1.7 - 2.4 mg/dL  Glucose, capillary     Status: Abnormal   Collection Time: 04/03/20  7:46 AM  Result Value Ref Range   Glucose-Capillary 151 (H) 70 - 99 mg/dL   Comment 1 Notify RN    Comment 2 Document in Chart   Glucose, capillary     Status: Abnormal   Collection Time: 04/03/20 12:17 PM  Result Value Ref Range   Glucose-Capillary 135 (H) 70 - 99 mg/dL   Comment 1 Notify RN    Comment 2 Document in Chart     Recent Results (from the past 240 hour(s))  Urine Culture     Status: Abnormal   Collection Time: 03/25/20  2:36 PM   Specimen: Urine, Clean Catch  Result Value Ref Range Status   Specimen Description   Final    URINE, CLEAN CATCH Performed at [facility], [address], [place], [place] [address]    Special Requests   Final    NONE Performed at [facility], [place] [address], [place], [place] [address]    Culture (A)  Final    <10,000 COLONIES/mL INSIGNIFICANT GROWTH Performed at [place] [address], [place], [place] [address]    Report Status 03/26/2020 FINAL  Final  Culture, blood (routine x 2)     Status: None   Collection Time: 03/26/20 10:20 AM   Specimen: BLOOD  Result Value Ref Range Status   Specimen Description   Final    BLOOD BLOOD  RIGHT FOREARM Performed at [facility], [place] [address], [place], [place] [address]    Special Requests   Final    BOTTLES DRAWN AEROBIC AND ANAEROBIC Blood Culture adequate volume Performed at  [place] [address] [place], [place] [address]    Culture   Final    NO GROWTH 5 DAYS Performed at [facility], [place] [address] [place], [place] [address]    Report Status 03/31/2020 FINAL  Final  Respiratory Panel by RT PCR (Flu A&B, Covid) - Nasopharyngeal Swab     Status: None   Collection Time: 03/26/20 10:24 AM   Specimen: Nasopharyngeal Swab  Result Value Ref Range Status   SARS Coronavirus 2 by RT PCR NEGATIVE NEGATIVE Final    Comment: (NOTE) SARS-CoV-2 target nucleic acids are NOT DETECTED.  The SARS-CoV-2 RNA is generally detectable in upper respiratoy specimens during the acute phase of infection. The lowest concentration of SARS-CoV-2 viral copies this assay can detect is 131 copies/mL. A negative result does not preclude SARS-Cov-2 infection and should not be used as the sole basis for treatment or other patient management decisions. A negative result may occur with  improper specimen collection/handling, submission of specimen other than nasopharyngeal swab, presence of viral mutation(s) within the areas targeted by this assay, and inadequate number of viral copies (<131 copies/mL). A negative result must be combined with clinical observations, patient history, and epidemiological information. The expected result is Negative.  Fact Sheet for Patients:  PinkCheek.be  Fact Sheet for Healthcare Providers:  GravelBags.it  This test is no t yet approved or cleared by the [place] FDA and  has been authorized for detection and/or diagnosis of SARS-CoV-2 by FDA under an Emergency Use Authorization (EUA). This EUA will remain  in effect (meaning this test can be used) for the duration of the COVID-19 declaration under Section 564(b)(1) of the Act, 21 U.S.C. section 360bbb-3(b)(1), unless the authorization is terminated or revoked sooner.     Influenza A by PCR NEGATIVE  NEGATIVE Final   Influenza B by PCR NEGATIVE NEGATIVE Final    Comment: (NOTE) The Xpert Xpress SARS-CoV-2/FLU/RSV assay is intended as an aid in  the diagnosis of influenza from Nasopharyngeal swab specimens and  should not be used as a sole basis for treatment. Nasal washings and  aspirates are unacceptable for Xpert Xpress SARS-CoV-2/FLU/RSV  testing.  Fact Sheet for Patients: PinkCheek.be  Fact Sheet for Healthcare Providers: GravelBags.it  This test is not yet approved or cleared by the [place] FDA and  has been authorized for detection and/or diagnosis of SARS-CoV-2 by  FDA under an Emergency Use Authorization (EUA). This EUA will remain  in effect (meaning this test can be used) for the duration of the  Covid-19 declaration under Section 564(b)(1) of the Act, 21  U.S.C. section 360bbb-3(b)(1), unless the authorization is  terminated or revoked. Performed at [facility], [place] [address], [place], [place] [address]   Culture, blood (routine x 2)     Status: None   Collection Time: 03/26/20 10:24 AM   Specimen: BLOOD  Result Value Ref Range Status   Specimen Description   Final    BLOOD BLOOD LEFT FOREARM Performed at [place] [address], [place], [place] [address]    Special Requests   Final    BOTTLES DRAWN AEROBIC AND ANAEROBIC Blood Culture adequate volume Performed at [place] [address] [place],  [place] [address]    Culture   Final    NO GROWTH 5 DAYS Performed at [facility], [place] [address], [place], [place] [address]    Report Status 03/31/2020 FINAL  Final  Respiratory Panel by RT PCR (Flu A&B, Covid) - Nasopharyngeal Swab     Status: None   Collection Time: 04/02/20  4:51 PM   Specimen: Nasopharyngeal Swab  Result Value Ref Range Status   SARS Coronavirus 2 by RT PCR NEGATIVE NEGATIVE Final    Comment:  (NOTE) SARS-CoV-2 target nucleic acids are NOT DETECTED.  The SARS-CoV-2 RNA is generally detectable in upper respiratoy specimens during the acute phase of infection. The lowest concentration of SARS-CoV-2 viral copies this assay can detect is 131 copies/mL. A negative result does not preclude SARS-Cov-2 infection and should not be used as the sole basis for treatment or other patient management decisions. A negative result may occur with  improper specimen collection/handling, submission of specimen other than nasopharyngeal swab, presence of viral mutation(s) within the areas targeted by this assay, and inadequate number of viral copies (<131 copies/mL). A negative result must be combined with clinical observations, patient history, and epidemiological information. The expected result is Negative.  Fact Sheet for Patients:  PinkCheek.be  Fact Sheet for Healthcare Providers:  GravelBags.it  This test is no t yet approved or cleared by the [place] FDA and  has been authorized for detection and/or diagnosis of SARS-CoV-2 by FDA under an Emergency Use Authorization (EUA). This EUA will remain  in effect (meaning this test can be used) for the duration of the COVID-19 declaration under Section 564(b)(1) of the Act, 21 U.S.C. section 360bbb-3(b)(1), unless the authorization is terminated or revoked sooner.     Influenza A by PCR NEGATIVE NEGATIVE Final   Influenza B by PCR NEGATIVE NEGATIVE Final    Comment: (NOTE) The Xpert Xpress SARS-CoV-2/FLU/RSV assay is intended as an aid in  the diagnosis of influenza from Nasopharyngeal swab specimens and  should not be used as a sole basis for treatment. Nasal washings and  aspirates are unacceptable for Xpert Xpress SARS-CoV-2/FLU/RSV  testing.  Fact Sheet for Patients: PinkCheek.be  Fact Sheet for Healthcare  Providers: GravelBags.it  This test is not yet approved or cleared by the [place] FDA and  has been authorized for detection and/or diagnosis of SARS-CoV-2 by  FDA under an Emergency Use Authorization (EUA). This EUA will remain  in effect (meaning this test can be used) for the duration of the  Covid-19 declaration under Section 564(b)(1) of the Act, 21  U.S.C. section 360bbb-3(b)(1), unless the authorization is  terminated or revoked. Performed at [facility], [place] [address] [place], [place] [address]      Radiology Studies: CT ABDOMEN PELVIS W CONTRAST  Result Date: 04/02/2020 CLINICAL DATA:  Lower abdominal pain for almost 2 months with worsening since last night. Nausea, vomiting and diarrhea. 25 pound weight loss. History of anal cancer status post radiation therapy in February 2021 with completion of chemotherapy 03/07/2020. EXAM: CT ABDOMEN AND PELVIS WITH CONTRAST TECHNIQUE: Multidetector CT imaging of the abdomen and pelvis was performed using the standard protocol following bolus administration of intravenous contrast. CONTRAST:  149mL OMNIPAQUE IOHEXOL 300 MG/ML  SOLN COMPARISON:  03/28/2020 CT abdomen/pelvis. FINDINGS: Lower chest: Posterior right lower lobe 3 mm solid pulmonary nodule (series 6/image 11), unchanged from 03/28/2020 CT abdomen study. Hepatobiliary: Normal liver size. No liver mass. Normal gallbladder with no radiopaque cholelithiasis. No biliary  ductal dilatation. Pancreas: Normal, with no mass or duct dilation. Spleen: Normal size. No mass. Adrenals/Urinary Tract: Normal adrenals. Moderate right hydroureteronephrosis to the level of distal right pelvic ureter, similar to slightly worsened since 03/28/2020 CT. Urothelial wall thickening and hyperenhancement in the distal 2-3 cm of the right pelvic ureter, similar. No left hydronephrosis. Normal caliber left ureter. Moderate renal cortical scarring throughout the  upper right kidney is unchanged. Subcentimeter hypodense renal cortical lesion in the anterior interpolar right kidney is too small to characterize and unchanged. No new renal lesions. Tiny focus of gas in the nondependent bladder. No definite bladder wall thickening. Small calcification in the anterior midline bladder wall is unchanged. Stomach/Bowel: Small hiatal hernia. Otherwise normal nondistended stomach. Long segment of moderate contiguous wall thickening in the distal and terminal ileum with associated mucosal hyperenhancement and surrounding mesenteric fat stranding and ill-defined fluid, similar to slightly improved in the interval. No new sites of small bowel wall thickening. No small bowel dilatation. Appendectomy. No definite large bowel wall thickening. No colonic diverticulosis. No acute pericolonic fat stranding. Large bowel is largely collapsed. Vascular/Lymphatic: Atherosclerotic nonaneurysmal abdominal aorta. Patent portal, splenic, hepatic and renal veins. Retroaortic left renal vein. No pathologically enlarged lymph nodes in the abdomen or pelvis. Reproductive: Status post hysterectomy, with no mass at the vaginal cuff. No adnexal mass. Other: No pneumoperitoneum. No focal fluid collections. Small volume free fluid in the pelvic cul-de-sac is similar. Musculoskeletal: No aggressive appearing focal osseous lesions. Moderate thoracolumbar spondylosis. IMPRESSION: 1. CT study is fairly similar to the CT study performed 5 days prior. 2. Nonspecific distal and terminal ileitis, potentially treatment related, similar to slightly improved. No bowel obstruction. No free air. 3. Moderate right hydroureteronephrosis to the level of the distal right pelvic ureter, similar to slightly worsened. Nonspecific urothelial wall thickening and hyperenhancement in the distal 2-3 cm of the right pelvic ureter, similar, probably inflammatory. Consider urology consultation. 4. Tiny focus of gas in the nondependent  bladder, presumably due to recent bladder instrumentation. Correlate with urinalysis as clinically warranted to exclude acute cystitis. 5. Stable small volume free fluid in the pelvic cul-de-sac. 6. No lymphadenopathy or other findings suspicious for metastatic disease in the abdomen or pelvis. Right lung base 3 mm solid pulmonary nodule is stable and warrants continued chest CT follow-up. 7. Small hiatal hernia. 8. Aortic Atherosclerosis (ICD10-I70.0). Electronically Signed   By: Ilona Sorrel M.D.   On: 04/02/2020 17:21   CT ABDOMEN PELVIS W CONTRAST  Final Result      Scheduled Meds: . clorazepate  7.5 mg Oral BID  . FLUoxetine  20 mg Oral q morning - 10a  . insulin aspart  0-20 Units Subcutaneous Q4H  . methylPREDNISolone (SOLU-MEDROL) injection  60 mg Intravenous Q8H  . propranolol  10 mg Oral BID  . sodium chloride flush  3 mL Intravenous Q12H   PRN Meds: acetaminophen **OR** acetaminophen, morphine injection, ondansetron **OR** ondansetron (ZOFRAN) IV, phenazopyridine Continuous Infusions: . sodium chloride 125 mL/hr at 04/03/20 1113     LOS: 1 day  Time spent: Greater than 50% of the 35 minute visit was spent in counseling/coordination of care for the patient as laid out in the A&P.   Dwyane Dee, MD [facility] 04/03/2020, 2:26 PM

## 2020-04-03 NOTE — Progress Notes (Signed)
Initial Nutrition Assessment   INTERVENTION:    Once diet advanced: -Ensure Enlive po BID, each supplement provides 350 kcal and 20 grams of protein  NUTRITION DIAGNOSIS:   Increased nutrient needs related to cancer and cancer related treatments as evidenced by estimated needs.  GOAL:   Patient will meet greater than or equal to 90% of their needs  MONITOR:   PO intake, Supplement acceptance, Weight trends, I & O's, Labs  REASON FOR ASSESSMENT:   Malnutrition Screening Tool    ASSESSMENT:   59 yo CF with PMH recurrent anal SCC (most recently treated with chemoradiation with mitomycin and 5-FU, last treatment 03/07/2020), DMII, HLD who presents to the ER with recurrent abdominal pain and nausea/vomiting at home.[name].  She was previously treated for radiation induced colitis earlier in October and a UTI.  Her most recent hospitalization was from 03/26/2020 to 04/01/2020 for obstructed right ureteral calculus with hydroureteronephrosis. Admitted for recurrent abdominal pain.  10/20: s/p Cystoscopy, rightureteroscopy and stone removal, Ureteroscopic laser lithotripsy  Patient readmitted x 2 now. Pt discharged 10/26.  Currently has continued abdominal pain but has improved today. Currently NPO. Will resume protein supplements once diet is advanced.  No weight measured for this new admission. Last recorded weight is from 10/20. At that time pt had 9% wt loss over 1.5 months, significant for time frame.   Medications reviewed.  Labs reviewed: CBGs: 135-151  Diet Order:   Diet Order            Diet NPO time specified Except for: Sips with Meds, Ice Chips  Diet effective now                 EDUCATION NEEDS:   No education needs have been identified at this time  Skin:  Skin Assessment: Reviewed RN Assessment  Last BM:  10/26  Height:   Ht Readings from Last 1 Encounters:  03/26/20 5\' 6"  (1.676 m)    Weight:   Wt Readings  from Last 1 Encounters:  03/26/20 66.4 kg    BMI:  There is no height or weight on file to calculate BMI.  Estimated Nutritional Needs:   Kcal:  2100-2300  Protein:  100-115g  Fluid:  2.1L/day  Clayton Bibles, MS, RD, LDN Inpatient Clinical Dietitian Contact information available via Amion

## 2020-04-04 ENCOUNTER — Other Ambulatory Visit: Payer: BC Managed Care – PPO

## 2020-04-04 ENCOUNTER — Ambulatory Visit: Payer: BC Managed Care – PPO | Admitting: Hematology

## 2020-04-04 DIAGNOSIS — R1031 Right lower quadrant pain: Secondary | ICD-10-CM | POA: Diagnosis not present

## 2020-04-04 LAB — CBC WITH DIFFERENTIAL/PLATELET
Abs Immature Granulocytes: 0.16 10*3/uL — ABNORMAL HIGH (ref 0.00–0.07)
Basophils Absolute: 0 10*3/uL (ref 0.0–0.1)
Basophils Relative: 0 %
Eosinophils Absolute: 0 10*3/uL (ref 0.0–0.5)
Eosinophils Relative: 0 %
HCT: 31.4 % — ABNORMAL LOW (ref 36.0–46.0)
Hemoglobin: 10.1 g/dL — ABNORMAL LOW (ref 12.0–15.0)
Immature Granulocytes: 4 %
Lymphocytes Relative: 7 %
Lymphs Abs: 0.3 10*3/uL — ABNORMAL LOW (ref 0.7–4.0)
MCH: 27.4 pg (ref 26.0–34.0)
MCHC: 32.2 g/dL (ref 30.0–36.0)
MCV: 85.3 fL (ref 80.0–100.0)
Monocytes Absolute: 0.1 10*3/uL (ref 0.1–1.0)
Monocytes Relative: 2 %
Neutro Abs: 3.3 10*3/uL (ref 1.7–7.7)
Neutrophils Relative %: 87 %
Platelets: 170 10*3/uL (ref 150–400)
RBC: 3.68 MIL/uL — ABNORMAL LOW (ref 3.87–5.11)
RDW: 16.1 % — ABNORMAL HIGH (ref 11.5–15.5)
WBC: 3.8 10*3/uL — ABNORMAL LOW (ref 4.0–10.5)
nRBC: 0 % (ref 0.0–0.2)

## 2020-04-04 LAB — GLUCOSE, CAPILLARY
Glucose-Capillary: 144 mg/dL — ABNORMAL HIGH (ref 70–99)
Glucose-Capillary: 145 mg/dL — ABNORMAL HIGH (ref 70–99)
Glucose-Capillary: 146 mg/dL — ABNORMAL HIGH (ref 70–99)
Glucose-Capillary: 157 mg/dL — ABNORMAL HIGH (ref 70–99)
Glucose-Capillary: 166 mg/dL — ABNORMAL HIGH (ref 70–99)
Glucose-Capillary: 170 mg/dL — ABNORMAL HIGH (ref 70–99)

## 2020-04-04 LAB — BASIC METABOLIC PANEL
Anion gap: 8 (ref 5–15)
BUN: 14 mg/dL (ref 8–23)
CO2: 23 mmol/L (ref 22–32)
Calcium: 8.7 mg/dL — ABNORMAL LOW (ref 8.9–10.3)
Chloride: 106 mmol/L (ref 98–111)
Creatinine, Ser: 0.63 mg/dL (ref 0.44–1.00)
GFR, Estimated: >60 mL/min (ref >60)
Glucose, Bld: 193 mg/dL — ABNORMAL HIGH (ref 70–99)
Potassium: 3.7 mmol/L (ref 3.5–5.1)
Sodium: 137 mmol/L (ref 135–145)

## 2020-04-04 LAB — MAGNESIUM: Magnesium: 2.4 mg/dL (ref 1.7–2.4)

## 2020-04-04 MED ORDER — INSULIN ASPART 100 UNIT/ML ~~LOC~~ SOLN
0.0000 [IU] | Freq: Three times a day (TID) | SUBCUTANEOUS | Status: DC
  Administered 2020-04-04: 3 [IU] via SUBCUTANEOUS
  Administered 2020-04-05: 7 [IU] via SUBCUTANEOUS
  Administered 2020-04-05 – 2020-04-08 (×6): 4 [IU] via SUBCUTANEOUS
  Administered 2020-04-09: 3 [IU] via SUBCUTANEOUS

## 2020-04-04 MED ORDER — TAMSULOSIN HCL 0.4 MG PO CAPS
0.4000 mg | ORAL_CAPSULE | Freq: Every day | ORAL | Status: DC
  Administered 2020-04-04 – 2020-04-09 (×6): 0.4 mg via ORAL
  Filled 2020-04-04 (×6): qty 1

## 2020-04-04 MED ORDER — METHYLPREDNISOLONE SODIUM SUCC 40 MG IJ SOLR
40.0000 mg | Freq: Two times a day (BID) | INTRAMUSCULAR | Status: DC
  Administered 2020-04-04 – 2020-04-06 (×4): 40 mg via INTRAVENOUS
  Filled 2020-04-04 (×4): qty 1

## 2020-04-04 MED ORDER — HYDROMORPHONE HCL 1 MG/ML IJ SOLN
0.8000 mg | Freq: Once | INTRAMUSCULAR | Status: AC
  Administered 2020-04-04: 0.8 mg via INTRAVENOUS
  Filled 2020-04-04: qty 1

## 2020-04-04 MED ORDER — KETOROLAC TROMETHAMINE 15 MG/ML IJ SOLN
15.0000 mg | Freq: Four times a day (QID) | INTRAMUSCULAR | Status: DC | PRN
  Administered 2020-04-04 – 2020-04-08 (×7): 15 mg via INTRAVENOUS
  Filled 2020-04-04 (×7): qty 1

## 2020-04-04 NOTE — Progress Notes (Signed)
PROGRESS NOTE    Melinda [name]   QPY:[id number]  DOB: [birth year]  DOA: 04/02/2020     2  PCP: Crist Infante, MD  CC: abdominal pain  Hospital Course: Melinda [name] is a 59 yo CF with PMH recurrent anal SCC (most recently treated with chemoradiation with mitomycin and 5-FU, last treatment 03/07/2020), DMII, HLD who presents to the ER with recurrent abdominal pain and nausea/vomiting at home. [name].  She was previously treated for radiation induced colitis earlier in October and a UTI.  Her most recent hospitalization was from 03/26/2020 to 04/01/2020 for obstructed right ureteral calculus with hydroureteronephrosis.  She underwent ureteroscopy with stone removal and laser lithotripsy and cystoscopy.  Distal ureteral stent was removed as kidney was noted to be draining well on the right side.  During that hospitalization she was treated with IV steroids, fluids, and pain medication.  She was not discharged with oral steroids due to anxiety associated with them.  She was also noted to have ileitis in the distal ileum.  She again presented to the ER with recurrent abdominal pain.  She states that on Tuesday she took some Colace then has 6 very soft/diarrheal bowel movements.  She then took some Imodium afterwards.  She states that she ate chocolate mousse at night which upset her stomach causing her to vomit.  Due to her recurrent abdominal pain she presented to the ER for evaluation once again.  She underwent repeat CT abdomen/pelvis on evaluation in the ER.  There was again noted to be distal and terminal ileitis, potentially treatment related.  There was moderate right hydroureteronephrosis to the level of the distal right pelvic ureter "similar to slightly worsened".  "Nonspecific urothelial wall thickening and hyperenhancement in the distal 2 to 3 cm of the right pelvic ureter, similar, probably inflammatory."  She was treated with Dilaudid, LR bolus, and  Zofran in the ER.  She is admitted for further evaluation and repeat consultation with urology.  Her pain seemed to localize to her RLQ as hospitalization continued. She had many reservations about nephrostomy tube placement. She was continued on serial exams, IVF, and solu-medrol which was slowly tapered every 2 days. Further plan with urology to be determined.    Interval History:  Abdominal pain more localized now to RLQ.  Discussed possible need for nephrostomy tube today but she says she would not want it unless absolutely necessary and wishes to discuss further with urology.  She seems to hold off on asking for pain med until too late; has not asked for much then this afternoon I was called by nursing that her pain was 10/10.   Old records reviewed in assessment of this patient  ROS: Constitutional: negative for chills and fevers, Respiratory: negative for cough, Cardiovascular: negative for chest pain and Gastrointestinal: positive for abdominal pain  Assessment & Plan: * Abdominal pain - differential for etiology includes terminal ileitis (?Radiation-associated) versus right ureteral thickening with associated hydroureteronephrosis -Appreciate urology consult.  Her abdominal pain was more diffuse on admission and now is localizing to RLQ - will see how flomax and toradol help with pain as per urology; may still need repeat CT and/or JJ stent placement depending on response; she does seem to be very apprehensive and against a nephrostomy tube for now  - continue CLD; IVF stopped  Hydroureteronephrosis -unclear if contributing to abdominal pain; recent stone and stent removal; CT shows nonspecific urothelial wall thickening and hyperenhancement of the distal 2  to 3 cm of the right pelvic ureter - appreciate urology following along; see abd pain as well   Recurrent anal squamous cell carcinoma ([place]) - most recently treated with chemoradiation with mitomycin and 5-FU, last treatment  03/07/2020 -Follows with Dr. Burr Medico; appreciate assistance as well in the management   Diabetes mellitus ([place]) - continue SSI and CBG monitoring - now on CLD; change to ACHS   Antimicrobials: none  DVT prophylaxis: SCD (patient declined lovenox) Code Status: Full Family Communication: none present Disposition Plan: Status is: Inpatient  Remains inpatient appropriate because:Ongoing diagnostic testing needed not appropriate for outpatient work up, Unsafe d/c plan, IV treatments appropriate due to intensity of illness or inability to take PO and Inpatient level of care appropriate due to severity of illness   Dispo: The patient is from: Home              Anticipated d/c is to: Home              Anticipated d/c date is: 2 days              Patient currently is not medically stable to d/c.   Objective: Blood pressure 140/68, pulse 71, temperature (!) 97.4 F (36.3 C), temperature source Oral, resp. rate 14, height 5\' 6"  (1.676 m), weight 65 kg, SpO2 94 %.  Examination: General appearance: Pleasant adult woman lying in bed appearing uncomfortable but in no obvious distress Head: Normocephalic, without obvious abnormality, atraumatic Eyes: EOMI Lungs: clear to auscultation bilaterally Heart: regular rate and rhythm and S1, S2 normal Abdomen: Soft.  More focal TTP in RLQ now; no R/G. BS present  Extremities: No edema Skin: mobility and turgor normal Neurologic: Grossly normal  Consultants:   Urology  Oncology   Procedures:   none  Data Reviewed: I have personally reviewed following labs and imaging studies Results for orders placed or performed during the hospital encounter of 04/02/20 (from the past 24 hour(s))  Glucose, capillary     Status: Abnormal   Collection Time: 04/03/20  4:31 PM  Result Value Ref Range   Glucose-Capillary 117 (H) 70 - 99 mg/dL   Comment 1 Notify RN    Comment 2 Document in Chart   Glucose, capillary     Status: Abnormal   Collection Time:  04/03/20  9:17 PM  Result Value Ref Range   Glucose-Capillary 188 (H) 70 - 99 mg/dL  Glucose, capillary     Status: Abnormal   Collection Time: 04/04/20 12:05 AM  Result Value Ref Range   Glucose-Capillary 157 (H) 70 - 99 mg/dL  Glucose, capillary     Status: Abnormal   Collection Time: 04/04/20  3:46 AM  Result Value Ref Range   Glucose-Capillary 166 (H) 70 - 99 mg/dL  Basic metabolic panel     Status: Abnormal   Collection Time: 04/04/20  4:46 AM  Result Value Ref Range   Sodium 137 135 - 145 mmol/L   Potassium 3.7 3.5 - 5.1 mmol/L   Chloride 106 98 - 111 mmol/L   CO2 23 22 - 32 mmol/L   Glucose, Bld 193 (H) 70 - 99 mg/dL   BUN 14 8 - 23 mg/dL   Creatinine, Ser 0.63 0.44 - 1.00 mg/dL   Calcium 8.7 (L) 8.9 - 10.3 mg/dL   GFR, Estimated >60 >60 mL/min   Anion gap 8 5 - 15  CBC with Differential/Platelet     Status: Abnormal   Collection Time: 04/04/20  4:46 AM  Result Value Ref Range   WBC 3.8 (L) 4.0 - 10.5 K/uL   RBC 3.68 (L) 3.87 - 5.11 MIL/uL   Hemoglobin 10.1 (L) 12.0 - 15.0 g/dL   HCT 31.4 (L) 36 - 46 %   MCV 85.3 80.0 - 100.0 fL   MCH 27.4 26.0 - 34.0 pg   MCHC 32.2 30.0 - 36.0 g/dL   RDW 16.1 (H) 11.5 - 15.5 %   Platelets 170 150 - 400 K/uL   nRBC 0.0 0.0 - 0.2 %   Neutrophils Relative % 87 %   Neutro Abs 3.3 1.7 - 7.7 K/uL   Lymphocytes Relative 7 %   Lymphs Abs 0.3 (L) 0.7 - 4.0 K/uL   Monocytes Relative 2 %   Monocytes Absolute 0.1 0.1 - 1.0 K/uL   Eosinophils Relative 0 %   Eosinophils Absolute 0.0 0.0 - 0.5 K/uL   Basophils Relative 0 %   Basophils Absolute 0.0 0.0 - 0.1 K/uL   Immature Granulocytes 4 %   Abs Immature Granulocytes 0.16 (H) 0.00 - 0.07 K/uL  Magnesium     Status: None   Collection Time: 04/04/20  4:46 AM  Result Value Ref Range   Magnesium 2.4 1.7 - 2.4 mg/dL  Glucose, capillary     Status: Abnormal   Collection Time: 04/04/20  7:48 AM  Result Value Ref Range   Glucose-Capillary 170 (H) 70 - 99 mg/dL   Comment 1 Notify RN     Comment 2 Document in Chart   Glucose, capillary     Status: Abnormal   Collection Time: 04/04/20 12:35 PM  Result Value Ref Range   Glucose-Capillary 144 (H) 70 - 99 mg/dL    Recent Results (from the past 240 hour(s))  Culture, blood (routine x 2)     Status: None   Collection Time: 03/26/20 10:20 AM   Specimen: BLOOD  Result Value Ref Range Status   Specimen Description   Final    BLOOD BLOOD RIGHT FOREARM Performed at [facility], [address], [place], [place] [address]    Special Requests   Final    BOTTLES DRAWN AEROBIC AND ANAEROBIC Blood Culture adequate volume Performed at [place] [address] [place], [place] [address]    Culture   Final    NO GROWTH 5 DAYS Performed at [facility], [place] [address] [place], [place] [address]    Report Status 03/31/2020 FINAL  Final  Respiratory Panel by RT PCR (Flu A&B, Covid) - Nasopharyngeal Swab     Status: None   Collection Time: 03/26/20 10:24 AM   Specimen: Nasopharyngeal Swab  Result Value Ref Range Status   SARS Coronavirus 2 by RT PCR NEGATIVE NEGATIVE Final    Comment: (NOTE) SARS-CoV-2 target nucleic acids are NOT DETECTED.  The SARS-CoV-2 RNA is generally detectable in upper respiratoy specimens during the acute phase of infection. The lowest concentration of SARS-CoV-2 viral copies this assay can detect is 131 copies/mL. A negative result does not preclude SARS-Cov-2 infection and should not be used as the sole basis for treatment or other patient management decisions. A negative result may occur with  improper specimen collection/handling, submission of specimen other than nasopharyngeal swab, presence of viral mutation(s) within the areas targeted by this assay, and inadequate number of viral copies (<131 copies/mL). A negative result must be combined with clinical observations, patient history, and epidemiological information. The expected result is  Negative.  Fact Sheet for Patients:  PinkCheek.be  Fact Sheet for Healthcare Providers:  GravelBags.it  This test is no t yet approved or cleared by the [place] FDA and  has been authorized for detection and/or diagnosis of SARS-CoV-2 by FDA under an Emergency Use Authorization (EUA). This EUA will remain  in effect (meaning this test can be used) for the duration of the COVID-19 declaration under Section 564(b)(1) of the Act, 21 U.S.C. section 360bbb-3(b)(1), unless the authorization is terminated or revoked sooner.     Influenza A by PCR NEGATIVE NEGATIVE Final   Influenza B by PCR NEGATIVE NEGATIVE Final    Comment: (NOTE) The Xpert Xpress SARS-CoV-2/FLU/RSV assay is intended as an aid in  the diagnosis of influenza from Nasopharyngeal swab specimens and  should not be used as a sole basis for treatment. Nasal washings and  aspirates are unacceptable for Xpert Xpress SARS-CoV-2/FLU/RSV  testing.  Fact Sheet for Patients: PinkCheek.be  Fact Sheet for Healthcare Providers: GravelBags.it  This test is not yet approved or cleared by the [place] FDA and  has been authorized for detection and/or diagnosis of SARS-CoV-2 by  FDA under an Emergency Use Authorization (EUA). This EUA will remain  in effect (meaning this test can be used) for the duration of the  Covid-19 declaration under Section 564(b)(1) of the Act, 21  U.S.C. section 360bbb-3(b)(1), unless the authorization is  terminated or revoked. Performed at [facility], [place] [address] [place], [place] [address]   Culture, blood (routine x 2)     Status: None   Collection Time: 03/26/20 10:24 AM   Specimen: BLOOD  Result Value Ref Range Status   Specimen Description   Final    BLOOD BLOOD LEFT FOREARM Performed at [facility] [address],  [place], [place] [address]    Special Requests   Final    BOTTLES DRAWN AEROBIC AND ANAEROBIC Blood Culture adequate volume Performed at [facility] [address], [place], [place] [address]    Culture   Final    NO GROWTH 5 DAYS Performed at [facility], [place] [address], [place], [place] [address]    Report Status 03/31/2020 FINAL  Final  Respiratory Panel by RT PCR (Flu A&B, Covid) - Nasopharyngeal Swab     Status: None   Collection Time: 04/02/20  4:51 PM   Specimen: Nasopharyngeal Swab  Result Value Ref Range Status   SARS Coronavirus 2 by RT PCR NEGATIVE NEGATIVE Final    Comment: (NOTE) SARS-CoV-2 target nucleic acids are NOT DETECTED.  The SARS-CoV-2 RNA is generally detectable in upper respiratoy specimens during the acute phase of infection. The lowest concentration of SARS-CoV-2 viral copies this assay can detect is 131 copies/mL. A negative result does not preclude SARS-Cov-2 infection and should not be used as the sole basis for treatment or other patient management decisions. A negative result may occur with  improper specimen collection/handling, submission of specimen other than nasopharyngeal swab, presence of viral mutation(s) within the areas targeted by this assay, and inadequate number of viral copies (<131 copies/mL). A negative result must be combined with clinical observations, patient history, and epidemiological information. The expected result is Negative.  Fact Sheet for Patients:  PinkCheek.be  Fact Sheet for Healthcare Providers:  GravelBags.it  This test is no t yet approved or cleared by the [place] FDA and  has been authorized for detection and/or diagnosis of SARS-CoV-2 by FDA under an Emergency Use Authorization (EUA). This EUA will  remain  in effect (meaning this test can be used) for the duration of the COVID-19 declaration under Section 564(b)(1) of the  Act, 21 U.S.C. section 360bbb-3(b)(1), unless the authorization is terminated or revoked sooner.     Influenza A by PCR NEGATIVE NEGATIVE Final   Influenza B by PCR NEGATIVE NEGATIVE Final    Comment: (NOTE) The Xpert Xpress SARS-CoV-2/FLU/RSV assay is intended as an aid in  the diagnosis of influenza from Nasopharyngeal swab specimens and  should not be used as a sole basis for treatment. Nasal washings and  aspirates are unacceptable for Xpert Xpress SARS-CoV-2/FLU/RSV  testing.  Fact Sheet for Patients: PinkCheek.be  Fact Sheet for Healthcare Providers: GravelBags.it  This test is not yet approved or cleared by the [place] FDA and  has been authorized for detection and/or diagnosis of SARS-CoV-2 by  FDA under an Emergency Use Authorization (EUA). This EUA will remain  in effect (meaning this test can be used) for the duration of the  Covid-19 declaration under Section 564(b)(1) of the Act, 21  U.S.C. section 360bbb-3(b)(1), unless the authorization is  terminated or revoked. Performed at [facility], [place] [address] [place], [place] [address]   Urine culture     Status: Abnormal (Preliminary result)   Collection Time: 04/02/20  6:58 PM   Specimen: Urine, Random  Result Value Ref Range Status   Specimen Description   Final    URINE, RANDOM Performed at [place] [address] [place], [place] [address]    Special Requests   Final    NONE Performed at [facility], [place] [address] [place], [place] [address]    Culture (A)  Final    20,000 COLONIES/mL PSEUDOMONAS AERUGINOSA SUSCEPTIBILITIES TO FOLLOW Performed at [facility], [place] [address], [place], [place] [address]    Report Status PENDING  Incomplete     Radiology Studies: No results found. CT ABDOMEN PELVIS W CONTRAST  Final Result      Scheduled Meds: . clorazepate  7.5  mg Oral BID  . FLUoxetine  20 mg Oral q morning - 10a  . insulin aspart  0-20 Units Subcutaneous TID AC & HS  . methylPREDNISolone (SOLU-MEDROL) injection  40 mg Intravenous BID  . propranolol  10 mg Oral BID  . rosuvastatin  5 mg Oral Daily  . sodium chloride flush  3 mL Intravenous Q12H  . tamsulosin  0.4 mg Oral Daily   PRN Meds: acetaminophen **OR** acetaminophen, ketorolac, morphine injection, ondansetron **OR** ondansetron (ZOFRAN) IV, phenazopyridine Continuous Infusions:    LOS: 2 days  Time spent: Greater than 50% of the 35 minute visit was spent in counseling/coordination of care for the patient as laid out in the A&P.   Dwyane Dee, MD [facility] 04/04/2020, 3:40 PM

## 2020-04-04 NOTE — Progress Notes (Signed)
Subjective: Patient reports .  Patient complaints of right lower abdominal discomfort which seems to be more point tender today.  No nausea vomiting or flank pain.  Has not required a large amount of narcotics.  Is not taking any Toradol.  Objective: Vital signs in last 24 hours: Temp:  [97.4 F (36.3 C)-98 F (36.7 C)] 97.4 F (36.3 C) (10/29 0546) Pulse Rate:  [71-74] 71 (10/29 0546) Resp:  [14-16] 14 (10/29 0546) BP: (134-140)/(68-73) 140/68 (10/29 0546) SpO2:  [94 %-95 %] 94 % (10/29 0546)  Intake/Output from previous day: 10/28 0701 - 10/29 0700 In: 391.1 [I.V.:391.1] Out: 2675 [Urine:2675] Intake/Output this shift: No intake/output data recorded.  Physical Exam:  General:alert, cooperative and no distress GI: not done, soft and tenderness: RLQ, no rebound tenderness no CVA tenderness Female genitalia: not done   Lab Results: Recent Labs    04/02/20 1325 04/03/20 0417 04/04/20 0446  HGB 12.7 10.0* 10.1*  HCT 38.6 31.4* 31.4*   BMET Recent Labs    04/03/20 0417 04/04/20 0446  NA 137 137  K 3.7 3.7  CL 105 106  CO2 24 23  GLUCOSE 200* 193*  BUN 16 14  CREATININE 0.56 0.63  CALCIUM 8.4* 8.7*   No results for input(s): LABPT, INR in the last 72 hours. No results for input(s): LABURIN in the last 72 hours. Results for orders placed or performed during the hospital encounter of 04/02/20  Respiratory Panel by RT PCR (Flu A&B, Covid) - Nasopharyngeal Swab     Status: None   Collection Time: 04/02/20  4:51 PM   Specimen: Nasopharyngeal Swab  Result Value Ref Range Status   SARS Coronavirus 2 by RT PCR NEGATIVE NEGATIVE Final    Comment: (NOTE) SARS-CoV-2 target nucleic acids are NOT DETECTED.  The SARS-CoV-2 RNA is generally detectable in upper respiratoy specimens during the acute phase of infection. The lowest concentration of SARS-CoV-2 viral copies this assay can detect is 131 copies/mL. A negative result does not preclude SARS-Cov-2 infection and  should not be used as the sole basis for treatment or other patient management decisions. A negative result may occur with  improper specimen collection/handling, submission of specimen other than nasopharyngeal swab, presence of viral mutation(s) within the areas targeted by this assay, and inadequate number of viral copies (<131 copies/mL). A negative result must be combined with clinical observations, patient history, and epidemiological information. The expected result is Negative.  Fact Sheet for Patients:  PinkCheek.be  Fact Sheet for Healthcare Providers:  GravelBags.it  This test is no t yet approved or cleared by the [place] FDA and  has been authorized for detection and/or diagnosis of SARS-CoV-2 by FDA under an Emergency Use Authorization (EUA). This EUA will remain  in effect (meaning this test can be used) for the duration of the COVID-19 declaration under Section 564(b)(1) of the Act, 21 U.S.C. section 360bbb-3(b)(1), unless the authorization is terminated or revoked sooner.     Influenza A by PCR NEGATIVE NEGATIVE Final   Influenza B by PCR NEGATIVE NEGATIVE Final    Comment: (NOTE) The Xpert Xpress SARS-CoV-2/FLU/RSV assay is intended as an aid in  the diagnosis of influenza from Nasopharyngeal swab specimens and  should not be used as a sole basis for treatment. Nasal washings and  aspirates are unacceptable for Xpert Xpress SARS-CoV-2/FLU/RSV  testing.  Fact Sheet for Patients: PinkCheek.be  Fact Sheet for Healthcare Providers: GravelBags.it  This test is not yet approved or cleared by the [place] FDA  and  has been authorized for detection and/or diagnosis of SARS-CoV-2 by  FDA under an Emergency Use Authorization (EUA). This EUA will remain  in effect (meaning this test can be used) for the duration of the  Covid-19 declaration  under Section 564(b)(1) of the Act, 21  U.S.C. section 360bbb-3(b)(1), unless the authorization is  terminated or revoked. Performed at [facility], [place] [address], [place], [place] [address]   Urine culture     Status: Abnormal (Preliminary result)   Collection Time: 04/02/20  6:58 PM   Specimen: Urine, Random  Result Value Ref Range Status   Specimen Description   Final    URINE, RANDOM Performed at [facility] [address], [place], [place] [address]    Special Requests   Final    NONE Performed at [facility], [place] [address] [place], [place] [address]    Culture 20,000 COLONIES/mL GRAM NEGATIVE RODS (A)  Final   Report Status PENDING  Incomplete    Studies/Results: CT ABDOMEN PELVIS W CONTRAST  Result Date: 04/02/2020 CLINICAL DATA:  Lower abdominal pain for almost 2 months with worsening since last night. Nausea, vomiting and diarrhea. 25 pound weight loss. History of anal cancer status post radiation therapy in February 2021 with completion of chemotherapy 03/07/2020. EXAM: CT ABDOMEN AND PELVIS WITH CONTRAST TECHNIQUE: Multidetector CT imaging of the abdomen and pelvis was performed using the standard protocol following bolus administration of intravenous contrast. CONTRAST:  168mL OMNIPAQUE IOHEXOL 300 MG/ML  SOLN COMPARISON:  03/28/2020 CT abdomen/pelvis. FINDINGS: Lower chest: Posterior right lower lobe 3 mm solid pulmonary nodule (series 6/image 11), unchanged from 03/28/2020 CT abdomen study. Hepatobiliary: Normal liver size. No liver mass. Normal gallbladder with no radiopaque cholelithiasis. No biliary ductal dilatation. Pancreas: Normal, with no mass or duct dilation. Spleen: Normal size. No mass. Adrenals/Urinary Tract: Normal adrenals. Moderate right hydroureteronephrosis to the level of distal right pelvic ureter, similar to slightly worsened since 03/28/2020 CT. Urothelial wall thickening and hyperenhancement  in the distal 2-3 cm of the right pelvic ureter, similar. No left hydronephrosis. Normal caliber left ureter. Moderate renal cortical scarring throughout the upper right kidney is unchanged. Subcentimeter hypodense renal cortical lesion in the anterior interpolar right kidney is too small to characterize and unchanged. No new renal lesions. Tiny focus of gas in the nondependent bladder. No definite bladder wall thickening. Small calcification in the anterior midline bladder wall is unchanged. Stomach/Bowel: Small hiatal hernia. Otherwise normal nondistended stomach. Long segment of moderate contiguous wall thickening in the distal and terminal ileum with associated mucosal hyperenhancement and surrounding mesenteric fat stranding and ill-defined fluid, similar to slightly improved in the interval. No new sites of small bowel wall thickening. No small bowel dilatation. Appendectomy. No definite large bowel wall thickening. No colonic diverticulosis. No acute pericolonic fat stranding. Large bowel is largely collapsed. Vascular/Lymphatic: Atherosclerotic nonaneurysmal abdominal aorta. Patent portal, splenic, hepatic and renal veins. Retroaortic left renal vein. No pathologically enlarged lymph nodes in the abdomen or pelvis. Reproductive: Status post hysterectomy, with no mass at the vaginal cuff. No adnexal mass. Other: No pneumoperitoneum. No focal fluid collections. Small volume free fluid in the pelvic cul-de-sac is similar. Musculoskeletal: No aggressive appearing focal osseous lesions. Moderate thoracolumbar spondylosis. IMPRESSION: 1. CT study is fairly similar to the CT study performed 5 days prior. 2. Nonspecific distal and terminal ileitis, potentially treatment related, similar to slightly improved. No bowel obstruction. No free air. 3. Moderate right hydroureteronephrosis to the  level of the distal right pelvic ureter, similar to slightly worsened. Nonspecific urothelial wall thickening and  hyperenhancement in the distal 2-3 cm of the right pelvic ureter, similar, probably inflammatory. Consider urology consultation. 4. Tiny focus of gas in the nondependent bladder, presumably due to recent bladder instrumentation. Correlate with urinalysis as clinically warranted to exclude acute cystitis. 5. Stable small volume free fluid in the pelvic cul-de-sac. 6. No lymphadenopathy or other findings suspicious for metastatic disease in the abdomen or pelvis. Right lung base 3 mm solid pulmonary nodule is stable and warrants continued chest CT follow-up. 7. Small hiatal hernia. 8. Aortic Atherosclerosis (ICD10-I70.0). Electronically Signed   By: Ilona Sorrel M.D.   On: 04/02/2020 17:21    Assessment/Plan: Right lower quadrant discomfort with history of recent ureteroscopy and findings of some ureteral edema and persistent hydronephrosis on CT scan 2 days ago.  Plan recommendation: Still not convinced this is renal colic however there are some objective findings on the CT scan.  I am going to place her on tamsulosin 0.4 mg daily in hopes of causing some ureteral dilatation and see if this helps with symptomatic relief.  Also add Toradol as needed for pain control.  If she continues to have significant right-sided pain will likely need repeat CT and then consider placement of right JJ stent.  Patient agreeable with plan.   LOS: 2 days   Melinda [name] 04/04/2020, 11:33 AM

## 2020-04-04 NOTE — Progress Notes (Addendum)
HEMATOLOGY-ONCOLOGY PROGRESS NOTE  SUBJECTIVE: Abdominal pain is less diffuse this morning.  Pain is primarily on the right side of her abdomen and radiates down towards her bladder.  Denies hematuria.  She states that she is not currently taking pain medication. She is not having any nausea or vomiting.  Had one episode of loose stools yesterday.  Oncology History Overview Note  Cancer Staging No matching staging information was found for the patient.    Squamous cell cancer, anus ([place])  07/13/2019 Procedure    EUA, Excision of posterior internal/external hemorrhoid under the care of Dr. Hassell Done    07/13/2019 Pathology Results   - Invasive moderately differentiated squamous cell carcinoma, 1.4 cm.  See comment  - Carcinoma invades for depth of 0.4 cm  - Deep resection margin is negative for carcinoma (0.2 cm)  - Lateral mucosal margin is positive for high-grade dysplasia  - No evidence of lymphovascular perineural invasion  Procedure: Local excision  Tumor Site: Anal canal  Tumor Size: 1.4 cm  Histologic Type: Invasive squamous cell carcinoma  Histologic Grade: G2: Moderately differentiated  Tumor Extension: Carcinoma invades superficial anal sphincter muscle  Margins: Uninvolved by tumor  Treatment Effect: N/A  Regional Lymph Nodes: No lymph nodes submitted or found  Pathologic Stage Classification (pTNM, AJCC 8th Edition):  pT1, pNx  Representative Tumor Block: A1  Comment(s): Lateral mucosal margin is positive for high-grade squamous  dysplasia      07/13/2019 Initial Diagnosis   Squamous cell cancer, anus ([place])   09/04/2019 Imaging   CT scan Chest, Abdomen, and Pelvis   IMPRESSION: 1. No evidence of metastatic disease in the chest, abdomen or pelvis. 2. No discrete anorectal mass. 3. Chronic findings include: Punctate nonobstructing upper right renal stones and chronic right renal scarring. 4. Aortic Atherosclerosis (ICD10-I70.0).   01/10/2020 Pathology Results   A. ANAL  LESION, POSTERIOR MIDLINE, EXCISION:  - Squamous cell carcinoma, moderately differentiated.  Verbally reported by Dr. Saralyn Pilar 1.5 cm, negative margins, depth <1 mm   01/11/2020 Procedure   1. Excision of anal canal lesion (posterior midline) under the care of Dr. Dema Severin     01/28/2020 PET scan   IMPRESSION: 1. Mild focal anal hypermetabolism without discrete mass correlate on the CT images, nonspecific, differential includes postsurgical change or residual anal tumor. 2. No hypermetabolic locoregional or distant metastatic disease. 3. Chronic findings include: Aortic Atherosclerosis (ICD10-I70.0). Diffuse hepatic steatosis. Nonobstructing right nephrolithiasis.     02/04/2020 - 03/07/2020 Radiation Therapy   concurrent chemoRT by Dr Lisbeth Renshaw with Mitomycin and 5FU starting 02/04/20-03/07/20. The last 4 session cancelled due to poor toleration.    02/04/2020 - 03/03/2020 Chemotherapy   Concurrent chemoRT with Mitomycin and 5FU on week 1 and 5 starting 02/04/20-03/03/20   03/13/2020 Imaging   CT AP W contrast  IMPRESSION: Diffuse wall thickening with inflammatory changes involving a long segment of distal ileum, cecum, and sigmoid colon likely represents enteritis/colitis. This could be due to infectious, or inflammatory nature, not thought to represent ischemic colitis.   Hepatic steatosis   Trace pericardial effusion   Small free fluid in the deep pelvis     I have reviewed the past medical history, past surgical history, social history and family history with the patient and they are unchanged from previous note.   PHYSICAL EXAMINATION: ECOG PERFORMANCE STATUS: 3 - Symptomatic, >50% confined to bed  Vitals:   04/03/20 2119 04/04/20 0546  BP: 134/73 140/68  Pulse: 74 71  Resp: 16 14  Temp: 98  F (36.7 C) (!) 97.4 F (36.3 C)  SpO2: 95% 94%   There were no vitals filed for this visit.  Intake/Output from previous day: 10/28 0701 - 10/29 0700 In: 391.1 [I.V.:391.1] Out:  2675 [Urine:2675]  GENERAL:alert, no distress and comfortable SKIN: skin color, texture, turgor are normal, no rashes or significant lesions LUNGS: clear to auscultation and percussion with normal breathing effort HEART: regular rate & rhythm and no murmurs and no lower extremity edema ABDOMEN: Soft, tenderness to palpation to the right side of her abdomen and over the bladder NEURO: alert & oriented x 3 with fluent speech, no focal motor/sensory deficits  LABORATORY DATA:  I have reviewed the data as listed CMP Latest Ref Rng & Units 04/04/2020 04/03/2020 04/02/2020  Glucose 70 - 99 mg/dL 193(H) 200(H) 153(H)  BUN 8 - 23 mg/dL 14 16 24(H)  Creatinine 0.44 - 1.00 mg/dL 0.63 0.56 0.77  Sodium 135 - 145 mmol/L 137 137 139  Potassium 3.5 - 5.1 mmol/L 3.7 3.7 3.1(L)  Chloride 98 - 111 mmol/L 106 105 101  CO2 22 - 32 mmol/L 23 24 25   Calcium 8.9 - 10.3 mg/dL 8.7(L) 8.4(L) 9.1  Total Protein 6.5 - 8.1 g/dL - - -  Total Bilirubin 0.3 - 1.2 mg/dL - - -  Alkaline Phos 38 - 126 U/L - - -  AST 15 - 41 U/L - - -  ALT 0 - 44 U/L - - -    Lab Results  Component Value Date   WBC 3.8 (L) 04/04/2020   HGB 10.1 (L) 04/04/2020   HCT 31.4 (L) 04/04/2020   MCV 85.3 04/04/2020   PLT 170 04/04/2020   NEUTROABS 3.3 04/04/2020    CT ABDOMEN PELVIS W CONTRAST  Result Date: 04/02/2020 CLINICAL DATA:  Lower abdominal pain for almost 2 months with worsening since last night. Nausea, vomiting and diarrhea. 25 pound weight loss. History of anal cancer status post radiation therapy in February 2021 with completion of chemotherapy 03/07/2020. EXAM: CT ABDOMEN AND PELVIS WITH CONTRAST TECHNIQUE: Multidetector CT imaging of the abdomen and pelvis was performed using the standard protocol following bolus administration of intravenous contrast. CONTRAST:  154mL OMNIPAQUE IOHEXOL 300 MG/ML  SOLN COMPARISON:  03/28/2020 CT abdomen/pelvis. FINDINGS: Lower chest: Posterior right lower lobe 3 mm solid pulmonary nodule  (series 6/image 11), unchanged from 03/28/2020 CT abdomen study. Hepatobiliary: Normal liver size. No liver mass. Normal gallbladder with no radiopaque cholelithiasis. No biliary ductal dilatation. Pancreas: Normal, with no mass or duct dilation. Spleen: Normal size. No mass. Adrenals/Urinary Tract: Normal adrenals. Moderate right hydroureteronephrosis to the level of distal right pelvic ureter, similar to slightly worsened since 03/28/2020 CT. Urothelial wall thickening and hyperenhancement in the distal 2-3 cm of the right pelvic ureter, similar. No left hydronephrosis. Normal caliber left ureter. Moderate renal cortical scarring throughout the upper right kidney is unchanged. Subcentimeter hypodense renal cortical lesion in the anterior interpolar right kidney is too small to characterize and unchanged. No new renal lesions. Tiny focus of gas in the nondependent bladder. No definite bladder wall thickening. Small calcification in the anterior midline bladder wall is unchanged. Stomach/Bowel: Small hiatal hernia. Otherwise normal nondistended stomach. Long segment of moderate contiguous wall thickening in the distal and terminal ileum with associated mucosal hyperenhancement and surrounding mesenteric fat stranding and ill-defined fluid, similar to slightly improved in the interval. No new sites of small bowel wall thickening. No small bowel dilatation. Appendectomy. No definite large bowel wall thickening. No colonic diverticulosis. No  acute pericolonic fat stranding. Large bowel is largely collapsed. Vascular/Lymphatic: Atherosclerotic nonaneurysmal abdominal aorta. Patent portal, splenic, hepatic and renal veins. Retroaortic left renal vein. No pathologically enlarged lymph nodes in the abdomen or pelvis. Reproductive: Status post hysterectomy, with no mass at the vaginal cuff. No adnexal mass. Other: No pneumoperitoneum. No focal fluid collections. Small volume free fluid in the pelvic cul-de-sac is similar.  Musculoskeletal: No aggressive appearing focal osseous lesions. Moderate thoracolumbar spondylosis. IMPRESSION: 1. CT study is fairly similar to the CT study performed 5 days prior. 2. Nonspecific distal and terminal ileitis, potentially treatment related, similar to slightly improved. No bowel obstruction. No free air. 3. Moderate right hydroureteronephrosis to the level of the distal right pelvic ureter, similar to slightly worsened. Nonspecific urothelial wall thickening and hyperenhancement in the distal 2-3 cm of the right pelvic ureter, similar, probably inflammatory. Consider urology consultation. 4. Tiny focus of gas in the nondependent bladder, presumably due to recent bladder instrumentation. Correlate with urinalysis as clinically warranted to exclude acute cystitis. 5. Stable small volume free fluid in the pelvic cul-de-sac. 6. No lymphadenopathy or other findings suspicious for metastatic disease in the abdomen or pelvis. Right lung base 3 mm solid pulmonary nodule is stable and warrants continued chest CT follow-up. 7. Small hiatal hernia. 8. Aortic Atherosclerosis (ICD10-I70.0). Electronically Signed   By: Ilona Sorrel M.D.   On: 04/02/2020 17:21   CT ABDOMEN PELVIS W CONTRAST  Result Date: 03/28/2020 CLINICAL DATA:  Acute nonlocalized abdominal pain EXAM: CT ABDOMEN AND PELVIS WITH CONTRAST TECHNIQUE: Multidetector CT imaging of the abdomen and pelvis was performed using the standard protocol following bolus administration of intravenous contrast. CONTRAST:  133mL OMNIPAQUE IOHEXOL 300 MG/ML  SOLN COMPARISON:  Two days ago FINDINGS: Lower chest:  Trace pericardial fluid, stable. Hepatobiliary: No focal liver abnormality.No evidence of biliary obstruction or stone. Pancreas: Unremarkable. Spleen: Unremarkable. Adrenals/Urinary Tract: Negative adrenals. Progressive right hydroureteronephrosis despite passage of a stone there is ureteral thickening at the UVJ. No left hydronephrosis. Right renal  cortical scarring affecting the upper pole. Punctate upper pole calculus on the right. Gas in the urinary bladder. Stomach/Bowel: Distal ileitis with submucosal low-density edema that is stable if not progressed. Small volume ascites is likely related. Appendectomy. History of anal cancer. No visible in a rectal mass. Vascular/Lymphatic: No acute vascular abnormality. Diffuse atheromatous plaque involving the aorta and iliacs. No mass or adenopathy. Reproductive:Hysterectomy. Other: Small volume reactive appearing ascites in the pelvis Musculoskeletal: No acute abnormalities. IMPRESSION: 1. Passed right ureteral calculus but progressive right hydroureteronephrosis in the setting of lower right ureteral thickening. 2. Ongoing distal ileitis which could be infectious, inflammatory, or radiation related. 3. Cortical scarring and small calculus of the right kidney. Electronically Signed   By: Monte Fantasia M.D.   On: 03/28/2020 07:14   CT ABDOMEN PELVIS W CONTRAST  Result Date: 03/26/2020 CLINICAL DATA:  Increasing abdominal pain since recent discharge EXAM: CT ABDOMEN AND PELVIS WITH CONTRAST TECHNIQUE: Multidetector CT imaging of the abdomen and pelvis was performed using the standard protocol following bolus administration of intravenous contrast. CONTRAST:  13mL OMNIPAQUE IOHEXOL 300 MG/ML  SOLN COMPARISON:  03/13/2020 FINDINGS: Lower chest: No acute abnormality. Hepatobiliary: Probable hepatic steatosis. No focal liver lesion. Gallbladder is unremarkable. No biliary dilatation. Pancreas: Unremarkable. Spleen: Unremarkable. Adrenals/Urinary Tract: Adrenals unremarkable. Punctate nonobstructing calculus of the upper pole the right kidney. There is right upper pole cortical scarring. Mild right hydroureteronephrosis. This is secondary to an obstructing 3 mm calculus of the distal ureter,  which was present on the prior study and has moved slightly inferiorly. Bladder is unremarkable. Stomach/Bowel: Stomach is  within normal limits. There is persistent wall thickening involving the terminal ileum and adjacent distal ileum. Decreased cecal wall thickening. No bowel obstruction. Vascular/Lymphatic: Aortic atherosclerosis. No enlarged lymph nodes identified. Reproductive: Status post hysterectomy. No adnexal masses. Other: Small volume abdominopelvic free fluid. Abdominal wall is unremarkable. Musculoskeletal: No acute osseous abnormality. IMPRESSION: New mild right hydroureteronephrosis. Secondary to an obstructing 3 mm distal right ureteral calculus. This was present on the prior study and has moved slightly inferiorly/distally. Residual or recurrent infectious/inflammatory changes of the distal ileum. Decreased cecal wall thickening. Electronically Signed   By: Macy Mis M.D.   On: 03/26/2020 09:39   CT ABDOMEN PELVIS W CONTRAST  Result Date: 03/13/2020 CLINICAL DATA:  Abdominal pain and fever, history of squamous cell EXAM: CT ABDOMEN AND PELVIS WITH CONTRAST TECHNIQUE: Multidetector CT imaging of the abdomen and pelvis was performed using the standard protocol following bolus administration of intravenous contrast. CONTRAST:  140mL OMNIPAQUE IOHEXOL 300 MG/ML  SOLN COMPARISON:  September 04, 2019 FINDINGS: Lower chest: The visualized heart size within normal limits. There is a trace pericardial effusion. No hiatal hernia. The visualized portions of the lungs are clear. Hepatobiliary: There is diffuse low density seen throughout the liver parenchyma. No focal hepatic lesion is seen. No intra or extrahepatic biliary ductal dilatation. The main portal vein is patent. No evidence of calcified gallstones, gallbladder wall thickening or biliary dilatation. Pancreas: Unremarkable. No pancreatic ductal dilatation or surrounding inflammatory changes. Spleen: Normal in size without focal abnormality. Adrenals/Urinary Tract: Both adrenal glands appear normal. There is a punctate calcifications seen in the upper pole of the  right kidney. There is areas of scarring seen within the upper pole. A 1 cm low-density lesion seen in the lower pole the right kidney. No left-sided renal or collecting system calculi. There is mild diffuse bladder wall thickening. Stomach/Bowel: The stomach and proximal small bowel are unremarkable. There is a long segment of ileum with diffuse wall thickening surrounding fat stranding changes to the terminal ileum. There is also wall thickening seen around the distal cecal pole. Mild wall thickening with surrounding fat stranding changes are seen around the sigmoid colon extending to the sigmoid rectal junction. No loculated fluid collections or free air however are noted. Vascular/Lymphatic: There are no enlarged mesenteric, retroperitoneal, or pelvic lymph nodes. Scattered aortic atherosclerotic calcifications are seen without aneurysmal dilatation. Reproductive: The patient is status post hysterectomy. No adnexal masses or collections seen. Other: A small amount of free fluid is seen within the deep cul-de-sac. Musculoskeletal: No acute or significant osseous findings. IMPRESSION: Diffuse wall thickening with inflammatory changes involving a long segment of distal ileum, cecum, and sigmoid colon likely represents enteritis/colitis. This could be due to infectious, or inflammatory nature, not thought to represent ischemic colitis. Hepatic steatosis Trace pericardial effusion Small free fluid in the deep pelvis Electronically Signed   By: Prudencio Pair M.D.   On: 03/13/2020 21:30   DG C-Arm 1-60 Min-No Report  Result Date: 03/26/2020 Fluoroscopy was utilized by the requesting physician.  No radiographic interpretation.    ASSESSMENT AND PLAN: 1.  Recurrent abdominal pain and nausea 2.  Recurrent squamous cell carcinoma of the anus, pT1 N0 M0 3.  Obstructing right ureteral calculus with hydroureteronephrosis 4.  Ileitis and colitis  5. Anxiety and depression    Plan -It's hard to say if her  abdominal pain was related to distal  ileitis versus vs recent right kidney stone related hydroureteronephrosis.  Awaiting urology input. -I agree with supportive care, IV fluids, pain management, and low-dose IV Solu-Medrol  -I will f/u    LOS: 2 days   Mikey Bussing 04/04/20  . Addendum  I have seen the patient, examined her. I agree with the assessment and and plan and have edited the notes.   Labria overall is doing better,although she is still concerned about her abdominal pain,whihc is localized in RLQ now. She was tearful when she talked about her severe pain at home. Urologist Dr. Milford Cage saw pt and we reviewed the plan together. I will defer this to urology and primary team for now, I am not convienced that her pain is mainly from ileitis. I will f/u as needed.   Truitt Merle  04/04/2020

## 2020-04-05 DIAGNOSIS — N390 Urinary tract infection, site not specified: Secondary | ICD-10-CM

## 2020-04-05 DIAGNOSIS — N3000 Acute cystitis without hematuria: Secondary | ICD-10-CM

## 2020-04-05 LAB — CBC WITH DIFFERENTIAL/PLATELET
Abs Immature Granulocytes: 0.21 10*3/uL — ABNORMAL HIGH (ref 0.00–0.07)
Basophils Absolute: 0 10*3/uL (ref 0.0–0.1)
Basophils Relative: 0 %
Eosinophils Absolute: 0 10*3/uL (ref 0.0–0.5)
Eosinophils Relative: 0 %
HCT: 30.6 % — ABNORMAL LOW (ref 36.0–46.0)
Hemoglobin: 9.9 g/dL — ABNORMAL LOW (ref 12.0–15.0)
Immature Granulocytes: 5 %
Lymphocytes Relative: 9 %
Lymphs Abs: 0.4 10*3/uL — ABNORMAL LOW (ref 0.7–4.0)
MCH: 27.8 pg (ref 26.0–34.0)
MCHC: 32.4 g/dL (ref 30.0–36.0)
MCV: 86 fL (ref 80.0–100.0)
Monocytes Absolute: 0.2 10*3/uL (ref 0.1–1.0)
Monocytes Relative: 4 %
Neutro Abs: 3.5 10*3/uL (ref 1.7–7.7)
Neutrophils Relative %: 82 %
Platelets: 166 10*3/uL (ref 150–400)
RBC: 3.56 MIL/uL — ABNORMAL LOW (ref 3.87–5.11)
RDW: 16 % — ABNORMAL HIGH (ref 11.5–15.5)
WBC: 4.3 10*3/uL (ref 4.0–10.5)
nRBC: 0 % (ref 0.0–0.2)

## 2020-04-05 LAB — BASIC METABOLIC PANEL
Anion gap: 9 (ref 5–15)
BUN: 24 mg/dL — ABNORMAL HIGH (ref 8–23)
CO2: 23 mmol/L (ref 22–32)
Calcium: 8.8 mg/dL — ABNORMAL LOW (ref 8.9–10.3)
Chloride: 106 mmol/L (ref 98–111)
Creatinine, Ser: 0.68 mg/dL (ref 0.44–1.00)
GFR, Estimated: >60 mL/min (ref >60)
Glucose, Bld: 158 mg/dL — ABNORMAL HIGH (ref 70–99)
Potassium: 3.6 mmol/L (ref 3.5–5.1)
Sodium: 138 mmol/L (ref 135–145)

## 2020-04-05 LAB — GLUCOSE, CAPILLARY
Glucose-Capillary: 107 mg/dL — ABNORMAL HIGH (ref 70–99)
Glucose-Capillary: 119 mg/dL — ABNORMAL HIGH (ref 70–99)
Glucose-Capillary: 151 mg/dL — ABNORMAL HIGH (ref 70–99)
Glucose-Capillary: 159 mg/dL — ABNORMAL HIGH (ref 70–99)
Glucose-Capillary: 161 mg/dL — ABNORMAL HIGH (ref 70–99)
Glucose-Capillary: 238 mg/dL — ABNORMAL HIGH (ref 70–99)

## 2020-04-05 LAB — URINE CULTURE: Culture: 20000 — AB

## 2020-04-05 LAB — MAGNESIUM: Magnesium: 2.5 mg/dL — ABNORMAL HIGH (ref 1.7–2.4)

## 2020-04-05 MED ORDER — SODIUM CHLORIDE 0.9 % IV SOLN
1.0000 g | Freq: Two times a day (BID) | INTRAVENOUS | Status: DC
  Administered 2020-04-05 – 2020-04-06 (×2): 1 g via INTRAVENOUS
  Filled 2020-04-05 (×3): qty 1

## 2020-04-05 MED ORDER — DIAZEPAM 5 MG PO TABS
10.0000 mg | ORAL_TABLET | Freq: Three times a day (TID) | ORAL | Status: DC | PRN
  Administered 2020-04-05: 10 mg via ORAL
  Filled 2020-04-05: qty 2

## 2020-04-05 NOTE — Progress Notes (Signed)
PROGRESS NOTE    Melinda [name]   KNL:[id number]  DOB: 01/04/1961  DOA: 04/02/2020     3  PCP: Crist Infante, MD  CC: abdominal pain  Hospital Course: Ms. Delawder is a 59 yo CF with PMH recurrent anal SCC (most recently treated with chemoradiation with mitomycin and 5-FU, last treatment 03/07/2020), DMII, HLD who presents to the ER with recurrent abdominal pain and nausea/vomiting at home. [name].  She was previously treated for radiation induced colitis earlier in October and a UTI.  Her most recent hospitalization was from 03/26/2020 to 04/01/2020 for obstructed right ureteral calculus with hydroureteronephrosis.  She underwent ureteroscopy with stone removal and laser lithotripsy and cystoscopy.  Distal ureteral stent was removed as kidney was noted to be draining well on the right side.  During that hospitalization she was treated with IV steroids, fluids, and pain medication.  She was not discharged with oral steroids due to anxiety associated with them.  She was also noted to have ileitis in the distal ileum.  She again presented to the ER with recurrent abdominal pain.  She states that on Tuesday she took some Colace then has 6 very soft/diarrheal bowel movements.  She then took some Imodium afterwards.  She states that she ate chocolate mousse at night which upset her stomach causing her to vomit.  Due to her recurrent abdominal pain she presented to the ER for evaluation once again.  She underwent repeat CT abdomen/pelvis on evaluation in the ER.  There was again noted to be distal and terminal ileitis, potentially treatment related.  There was moderate right hydroureteronephrosis to the level of the distal right pelvic ureter "similar to slightly worsened".  "Nonspecific urothelial wall thickening and hyperenhancement in the distal 2 to 3 cm of the right pelvic ureter, similar, probably inflammatory."  She was treated with Dilaudid, LR bolus, and  Zofran in the ER.  She is admitted for further evaluation and repeat consultation with urology.  Her pain seemed to localize to her RLQ as hospitalization continued. She had many reservations about nephrostomy tube placement.  Despite ongoing steroids and conservative measures her pain persisted. She underwent a trial of vaginal valium and toradol on 10/30.    Interval History:  No events overnight.  Still complaining of right lower quadrant abdominal pain.  She is also now complaining of worsening burning with urination since yesterday.  This appears to be a new symptom for her amongst her multiple other pelvic and abdominal pains.  In addition to this, her urine culture also just speciated out Pseudomonas, 20,000 colonies.  Given her symptoms and culture data, we discussed that it was not unreasonable to initiate antibiotics and monitor any response or improvement of her symptoms.  Old records reviewed in assessment of this patient  ROS: Constitutional: negative for chills and fevers, Respiratory: negative for cough, Cardiovascular: negative for chest pain and Gastrointestinal: positive for abdominal pain  Assessment & Plan: * Abdominal pain - differential for etiology includes terminal ileitis (?Radiation-associated) versus right ureteral thickening with associated hydroureteronephrosis -Appreciate urology consult.  Her abdominal pain was more diffuse on admission and now is localizing to RLQ - patient now started on trial of vaginal valium and toradol on 10/30; if no significant relief a stent may still be pursued; discussed with nursing staff to avoid opioids unless necessary - continue diet and NPO at [place] per urology  UTI (urinary tract infection) -Urine culture updated today with Pseudomonas, 20k colonies.  Despite colony count, in conjunction with her significant dysuria she is describing this morning that started overnight, reasonable to treat with at least a short course -Allergy list  and sensitivities limit options. Will discuss with pharmacy regarding best agent for use at this time; ideally prefer cefepime if able  Hydroureteronephrosis -unclear if contributing to abdominal pain; recent stone and stent removal; CT shows nonspecific urothelial wall thickening and hyperenhancement of the distal 2 to 3 cm of the right pelvic ureter - appreciate urology following along; see abd pain as well   Recurrent anal squamous cell carcinoma ([place]) - most recently treated with chemoradiation with mitomycin and 5-FU, last treatment 03/07/2020 -Follows with Dr. Burr Medico; appreciate assistance as well in the management   Diabetes mellitus ([place]) - continue SSI and CBG monitoring   Antimicrobials: none  DVT prophylaxis: SCD (patient declined lovenox) Code Status: Full Family Communication: none present Disposition Plan: Status is: Inpatient  Remains inpatient appropriate because:Ongoing diagnostic testing needed not appropriate for outpatient work up, Unsafe d/c plan, IV treatments appropriate due to intensity of illness or inability to take PO and Inpatient level of care appropriate due to severity of illness   Dispo: The patient is from: Home              Anticipated d/c is to: Home              Anticipated d/c date is: 2 days              Patient currently is not medically stable to d/c.   Objective: Blood pressure (!) 110/53, pulse 70, temperature 97.6 F (36.4 C), temperature source Oral, resp. rate 19, height 5\' 6"  (1.676 m), weight 65 kg, SpO2 91 %.  Examination: General appearance: Pleasant adult woman lying in bed appearing uncomfortable but in no obvious distress Head: Normocephalic, without obvious abnormality, atraumatic Eyes: EOMI Lungs: clear to auscultation bilaterally Heart: regular rate and rhythm and S1, S2 normal Abdomen: Soft. The TTP in RLQ is improved today (much less). No R/G. BS present  Extremities: No edema Skin: mobility and turgor  normal Neurologic: Grossly normal  Consultants:   Urology  Oncology   Procedures:   none  Data Reviewed: I have personally reviewed following labs and imaging studies Results for orders placed or performed during the hospital encounter of 04/02/20 (from the past 24 hour(s))  Glucose, capillary     Status: Abnormal   Collection Time: 04/04/20  4:50 PM  Result Value Ref Range   Glucose-Capillary 145 (H) 70 - 99 mg/dL   Comment 1 Notify RN    Comment 2 Document in Chart   Glucose, capillary     Status: Abnormal   Collection Time: 04/04/20  8:16 PM  Result Value Ref Range   Glucose-Capillary 146 (H) 70 - 99 mg/dL   Comment 1 Notify RN    Comment 2 Document in Chart   Glucose, capillary     Status: Abnormal   Collection Time: 04/05/20 12:02 AM  Result Value Ref Range   Glucose-Capillary 159 (H) 70 - 99 mg/dL   Comment 1 Notify RN    Comment 2 Document in Chart   Glucose, capillary     Status: Abnormal   Collection Time: 04/05/20  4:19 AM  Result Value Ref Range   Glucose-Capillary 161 (H) 70 - 99 mg/dL   Comment 1 Notify RN    Comment 2 Document in Chart   Basic metabolic panel     Status: Abnormal  Collection Time: 04/05/20  5:32 AM  Result Value Ref Range   Sodium 138 135 - 145 mmol/L   Potassium 3.6 3.5 - 5.1 mmol/L   Chloride 106 98 - 111 mmol/L   CO2 23 22 - 32 mmol/L   Glucose, Bld 158 (H) 70 - 99 mg/dL   BUN 24 (H) 8 - 23 mg/dL   Creatinine, Ser 0.68 0.44 - 1.00 mg/dL   Calcium 8.8 (L) 8.9 - 10.3 mg/dL   GFR, Estimated >60 >60 mL/min   Anion gap 9 5 - 15  CBC with Differential/Platelet     Status: Abnormal   Collection Time: 04/05/20  5:32 AM  Result Value Ref Range   WBC 4.3 4.0 - 10.5 K/uL   RBC 3.56 (L) 3.87 - 5.11 MIL/uL   Hemoglobin 9.9 (L) 12.0 - 15.0 g/dL   HCT 30.6 (L) 36 - 46 %   MCV 86.0 80.0 - 100.0 fL   MCH 27.8 26.0 - 34.0 pg   MCHC 32.4 30.0 - 36.0 g/dL   RDW 16.0 (H) 11.5 - 15.5 %   Platelets 166 150 - 400 K/uL   nRBC 0.0 0.0 - 0.2 %    Neutrophils Relative % 82 %   Neutro Abs 3.5 1.7 - 7.7 K/uL   Lymphocytes Relative 9 %   Lymphs Abs 0.4 (L) 0.7 - 4.0 K/uL   Monocytes Relative 4 %   Monocytes Absolute 0.2 0.1 - 1.0 K/uL   Eosinophils Relative 0 %   Eosinophils Absolute 0.0 0.0 - 0.5 K/uL   Basophils Relative 0 %   Basophils Absolute 0.0 0.0 - 0.1 K/uL   Immature Granulocytes 5 %   Abs Immature Granulocytes 0.21 (H) 0.00 - 0.07 K/uL  Magnesium     Status: Abnormal   Collection Time: 04/05/20  5:32 AM  Result Value Ref Range   Magnesium 2.5 (H) 1.7 - 2.4 mg/dL  Glucose, capillary     Status: Abnormal   Collection Time: 04/05/20  7:37 AM  Result Value Ref Range   Glucose-Capillary 151 (H) 70 - 99 mg/dL  Glucose, capillary     Status: Abnormal   Collection Time: 04/05/20 11:43 AM  Result Value Ref Range   Glucose-Capillary 107 (H) 70 - 99 mg/dL    Recent Results (from the past 240 hour(s))  Respiratory Panel by RT PCR (Flu A&B, Covid) - Nasopharyngeal Swab     Status: None   Collection Time: 04/02/20  4:51 PM   Specimen: Nasopharyngeal Swab  Result Value Ref Range Status   SARS Coronavirus 2 by RT PCR NEGATIVE NEGATIVE Final    Comment: (NOTE) SARS-CoV-2 target nucleic acids are NOT DETECTED.  The SARS-CoV-2 RNA is generally detectable in upper respiratoy specimens during the acute phase of infection. The lowest concentration of SARS-CoV-2 viral copies this assay can detect is 131 copies/mL. A negative result does not preclude SARS-Cov-2 infection and should not be used as the sole basis for treatment or other patient management decisions. A negative result may occur with  improper specimen collection/handling, submission of specimen other than nasopharyngeal swab, presence of viral mutation(s) within the areas targeted by this assay, and inadequate number of viral copies (<131 copies/mL). A negative result must be combined with clinical observations, patient history, and epidemiological information.  The expected result is Negative.  Fact Sheet for Patients:  PinkCheek.be  Fact Sheet for Healthcare Providers:  GravelBags.it  This test is no t yet approved or cleared by the [place] and  has been authorized for detection and/or diagnosis of SARS-CoV-2 by FDA under an Emergency Use Authorization (EUA). This EUA will remain  in effect (meaning this test can be used) for the duration of the COVID-19 declaration under Section 564(b)(1) of the Act, 21 U.S.C. section 360bbb-3(b)(1), unless the authorization is terminated or revoked sooner.     Influenza A by PCR NEGATIVE NEGATIVE Final   Influenza B by PCR NEGATIVE NEGATIVE Final    Comment: (NOTE) The Xpert Xpress SARS-CoV-2/FLU/RSV assay is intended as an aid in  the diagnosis of influenza from Nasopharyngeal swab specimens and  should not be used as a sole basis for treatment. Nasal washings and  aspirates are unacceptable for Xpert Xpress SARS-CoV-2/FLU/RSV  testing.  Fact Sheet for Patients: PinkCheek.be  Fact Sheet for Healthcare Providers: GravelBags.it  This test is not yet approved or cleared by the [place] FDA and  has been authorized for detection and/or diagnosis of SARS-CoV-2 by  FDA under an Emergency Use Authorization (EUA). This EUA will remain  in effect (meaning this test can be used) for the duration of the  Covid-19 declaration under Section 564(b)(1) of the Act, 21  U.S.C. section 360bbb-3(b)(1), unless the authorization is  terminated or revoked. Performed at [facility], [place] [address], [place], [place] [address]   Urine culture     Status: Abnormal   Collection Time: 04/02/20  6:58 PM   Specimen: Urine, Random  Result Value Ref Range Status   Specimen Description   Final    URINE, RANDOM Performed at [facility] [address], [place], [place] [address]    Special Requests   Final    NONE Performed at [facility], [place] [address] [place], [place] [address]    Culture 20,000 COLONIES/mL PSEUDOMONAS AERUGINOSA (A)  Final   Report Status 04/05/2020 FINAL  Final   Organism ID, Bacteria PSEUDOMONAS AERUGINOSA (A)  Final      Susceptibility   Pseudomonas aeruginosa - MIC*    CEFTAZIDIME <=1 SENSITIVE Sensitive     CIPROFLOXACIN 2 INTERMEDIATE Intermediate     GENTAMICIN <=1 SENSITIVE Sensitive     IMIPENEM >=16 RESISTANT Resistant     PIP/TAZO <=4 SENSITIVE Sensitive     CEFEPIME 0.5 SENSITIVE Sensitive     * 20,000 COLONIES/mL PSEUDOMONAS AERUGINOSA     Radiology Studies: No results found. CT ABDOMEN PELVIS W CONTRAST  Final Result      Scheduled Meds: . clorazepate  7.5 mg Oral BID  . FLUoxetine  20 mg Oral q morning - 10a  . insulin aspart  0-20 Units Subcutaneous TID AC & HS  . methylPREDNISolone (SOLU-MEDROL) injection  40 mg Intravenous BID  . propranolol  10 mg Oral BID  . rosuvastatin  5 mg Oral Daily  . sodium chloride flush  3 mL Intravenous Q12H  . tamsulosin  0.4 mg Oral Daily   PRN Meds: acetaminophen **OR** acetaminophen, diazepam, ketorolac, morphine injection, ondansetron **OR** ondansetron (ZOFRAN) IV, phenazopyridine Continuous Infusions:    LOS: 3 days  Time spent: Greater than 50% of the 35 minute visit was spent in counseling/coordination of care for the patient as laid out in the A&P.   Dwyane Dee, MD [facility] 04/05/2020, 2:12 PM

## 2020-04-05 NOTE — Progress Notes (Signed)
Subjective: The patient had several episodes yesterday of searing right lower quadrant pain.  It seemed to be focal and tender to palpation.  She then complained of a terrible cramping spasm-like pain with voiding.  She has had several rounds of IV narcotics.  She was also started on Toradol.  This plus a heating pad overnight seem to be a good combination for pain.  She has not had any pain medication since last night.  And she is not complaining of any real significant pain now.  She has very little complaints of flank pain or nausea. Objective: Vital signs in last 24 hours: Temp:  [98 F (36.7 C)-98.4 F (36.9 C)] 98.4 F (36.9 C) (10/30 0630) Pulse Rate:  [58-76] 76 (10/30 0630) Resp:  [18] 18 (10/30 0630) BP: (141-161)/(72-84) 161/84 (10/30 0630) SpO2:  [94 %-96 %] 95 % (10/30 0630) Weight:  [65 kg] 65 kg (10/29 1300)  Intake/Output from previous day: 10/29 0701 - 10/30 0700 In: 450  Out: 800 [Urine:800] Intake/Output this shift: No intake/output data recorded.  Physical Exam:  General:alert, cooperative and no distress GI: soft and tenderness: RLQ, no rebound tenderness no CVA tenderness Female genitalia: not done   Lab Results: Recent Labs    04/03/20 0417 04/04/20 0446 04/05/20 0532  HGB 10.0* 10.1* 9.9*  HCT 31.4* 31.4* 30.6*   BMET Recent Labs    04/04/20 0446 04/05/20 0532  NA 137 138  K 3.7 3.6  CL 106 106  CO2 23 23  GLUCOSE 193* 158*  BUN 14 24*  CREATININE 0.63 0.68  CALCIUM 8.7* 8.8*   No results for input(s): LABPT, INR in the last 72 hours. No results for input(s): LABURIN in the last 72 hours. Results for orders placed or performed during the hospital encounter of 04/02/20  Respiratory Panel by RT PCR (Flu A&B, Covid) - Nasopharyngeal Swab     Status: None   Collection Time: 04/02/20  4:51 PM   Specimen: Nasopharyngeal Swab  Result Value Ref Range Status   SARS Coronavirus 2 by RT PCR NEGATIVE NEGATIVE Final    Comment:  (NOTE) SARS-CoV-2 target nucleic acids are NOT DETECTED.  The SARS-CoV-2 RNA is generally detectable in upper respiratoy specimens during the acute phase of infection. The lowest concentration of SARS-CoV-2 viral copies this assay can detect is 131 copies/mL. A negative result does not preclude SARS-Cov-2 infection and should not be used as the sole basis for treatment or other patient management decisions. A negative result may occur with  improper specimen collection/handling, submission of specimen other than nasopharyngeal swab, presence of viral mutation(s) within the areas targeted by this assay, and inadequate number of viral copies (<131 copies/mL). A negative result must be combined with clinical observations, patient history, and epidemiological information. The expected result is Negative.  Fact Sheet for Patients:  PinkCheek.be  Fact Sheet for Healthcare Providers:  GravelBags.it  This test is no t yet approved or cleared by the [place] FDA and  has been authorized for detection and/or diagnosis of SARS-CoV-2 by FDA under an Emergency Use Authorization (EUA). This EUA will remain  in effect (meaning this test can be used) for the duration of the COVID-19 declaration under Section 564(b)(1) of the Act, 21 U.S.C. section 360bbb-3(b)(1), unless the authorization is terminated or revoked sooner.     Influenza A by PCR NEGATIVE NEGATIVE Final   Influenza B by PCR NEGATIVE NEGATIVE Final    Comment: (NOTE) The Xpert Xpress SARS-CoV-2/FLU/RSV assay is intended as an  aid in  the diagnosis of influenza from Nasopharyngeal swab specimens and  should not be used as a sole basis for treatment. Nasal washings and  aspirates are unacceptable for Xpert Xpress SARS-CoV-2/FLU/RSV  testing.  Fact Sheet for Patients: PinkCheek.be  Fact Sheet for Healthcare  Providers: GravelBags.it  This test is not yet approved or cleared by the [place] FDA and  has been authorized for detection and/or diagnosis of SARS-CoV-2 by  FDA under an Emergency Use Authorization (EUA). This EUA will remain  in effect (meaning this test can be used) for the duration of the  Covid-19 declaration under Section 564(b)(1) of the Act, 21  U.S.C. section 360bbb-3(b)(1), unless the authorization is  terminated or revoked. Performed at [facility], [name] [address], [place], [place] [address]   Urine culture     Status: Abnormal   Collection Time: 04/02/20  6:58 PM   Specimen: Urine, Random  Result Value Ref Range Status   Specimen Description   Final    URINE, RANDOM Performed at [facility] [address], [place], [place] [address]    Special Requests   Final    NONE Performed at [facility], [place] [address], [place], [place] [address]    Culture 20,000 COLONIES/mL PSEUDOMONAS AERUGINOSA (A)  Final   Report Status 04/05/2020 FINAL  Final   Organism ID, Bacteria PSEUDOMONAS AERUGINOSA (A)  Final      Susceptibility   Pseudomonas aeruginosa - MIC*    CEFTAZIDIME <=1 SENSITIVE Sensitive     CIPROFLOXACIN 2 INTERMEDIATE Intermediate     GENTAMICIN <=1 SENSITIVE Sensitive     IMIPENEM >=16 RESISTANT Resistant     PIP/TAZO <=4 SENSITIVE Sensitive     CEFEPIME 0.5 SENSITIVE Sensitive     * 20,000 COLONIES/mL PSEUDOMONAS AERUGINOSA    Studies/Results: No results found.  Assessment/Plan: Right lower quadrant discomfort with history of recent ureteroscopy and findings of some ureteral edema and persistent hydronephrosis on CT scan 2 days ago.  Again, she has very little CVA tenderness, and most of her pain is in the right lower quadrant which this morning has greatly subsided.  She does still have some pain in spasm when voiding.  This is more consistent with pelvic pain and  pelvic floor muscle soreness.  Plan recommendation:  I discussed treatment options with the patient including a stent in the right ureter, nephrostomy tube, or ongoing conservative medical management.  The patient is very reluctant to proceed with any sort of intervention like a stent or nephrostomy tube.  I have recommended that today for the next 24 hours that we try managing her pain as if it were pelvic floor muscle pain and see how she does.  This is typically done in clinic with intravaginal Valium 10 mg every 8 hours as needed.  In addition, I would continue the Toradol and use these 2 medications as first-line for her pain.  The patient should continue to receive tamsulosin as well.  I would use the IV and p.o. narcotics as needed for breakthrough pain.  If the patient fails this regimen, tomorrow we will plan to place a stent.  I will check on her again tomorrow morning, and have given her diet this morning with plans of making her n.p.o. past midnight.   LOS: 3 days   [name] 04/05/2020, 10:04 AM

## 2020-04-05 NOTE — Assessment & Plan Note (Addendum)
-  Urine culture updated 10/30 with Pseudomonas, 20k colonies.  Despite colony count, in conjunction with her significant dysuria she is describing on 10/30, reasonable to treat - Update, on 10/31 she notes good improvement in her dysuria, therefore this may also have been playing a role in some of her pain - pain continued to improve by 11/1 - discussed with ID pharmacist; s/p 3 days cefepime and cipro concentrates well in urine (patient has multiple ambiguous allergies that she does not remember reactions).....therefore we will start cipro inpatient on 11/1, monitor overnight and if tolerates, then d/c home 11/2 to finish completing course ( 7 day total, to finish on 04/11/20)

## 2020-04-06 ENCOUNTER — Inpatient Hospital Stay (HOSPITAL_COMMUNITY): Payer: BC Managed Care – PPO

## 2020-04-06 LAB — BASIC METABOLIC PANEL
Anion gap: 8 (ref 5–15)
BUN: 26 mg/dL — ABNORMAL HIGH (ref 8–23)
CO2: 26 mmol/L (ref 22–32)
Calcium: 8.8 mg/dL — ABNORMAL LOW (ref 8.9–10.3)
Chloride: 104 mmol/L (ref 98–111)
Creatinine, Ser: 0.77 mg/dL (ref 0.44–1.00)
GFR, Estimated: >60 mL/min (ref >60)
Glucose, Bld: 189 mg/dL — ABNORMAL HIGH (ref 70–99)
Potassium: 4.1 mmol/L (ref 3.5–5.1)
Sodium: 138 mmol/L (ref 135–145)

## 2020-04-06 LAB — GLUCOSE, CAPILLARY
Glucose-Capillary: 141 mg/dL — ABNORMAL HIGH (ref 70–99)
Glucose-Capillary: 155 mg/dL — ABNORMAL HIGH (ref 70–99)
Glucose-Capillary: 158 mg/dL — ABNORMAL HIGH (ref 70–99)
Glucose-Capillary: 163 mg/dL — ABNORMAL HIGH (ref 70–99)
Glucose-Capillary: 163 mg/dL — ABNORMAL HIGH (ref 70–99)
Glucose-Capillary: 190 mg/dL — ABNORMAL HIGH (ref 70–99)
Glucose-Capillary: 98 mg/dL (ref 70–99)

## 2020-04-06 LAB — CBC WITH DIFFERENTIAL/PLATELET
Abs Immature Granulocytes: 0.19 10*3/uL — ABNORMAL HIGH (ref 0.00–0.07)
Basophils Absolute: 0 10*3/uL (ref 0.0–0.1)
Basophils Relative: 0 %
Eosinophils Absolute: 0 10*3/uL (ref 0.0–0.5)
Eosinophils Relative: 0 %
HCT: 32.8 % — ABNORMAL LOW (ref 36.0–46.0)
Hemoglobin: 10.4 g/dL — ABNORMAL LOW (ref 12.0–15.0)
Immature Granulocytes: 6 %
Lymphocytes Relative: 10 %
Lymphs Abs: 0.4 10*3/uL — ABNORMAL LOW (ref 0.7–4.0)
MCH: 27.5 pg (ref 26.0–34.0)
MCHC: 31.7 g/dL (ref 30.0–36.0)
MCV: 86.8 fL (ref 80.0–100.0)
Monocytes Absolute: 0.2 10*3/uL (ref 0.1–1.0)
Monocytes Relative: 5 %
Neutro Abs: 2.7 10*3/uL (ref 1.7–7.7)
Neutrophils Relative %: 79 %
Platelets: 156 10*3/uL (ref 150–400)
RBC: 3.78 MIL/uL — ABNORMAL LOW (ref 3.87–5.11)
RDW: 15.7 % — ABNORMAL HIGH (ref 11.5–15.5)
WBC: 3.5 10*3/uL — ABNORMAL LOW (ref 4.0–10.5)
nRBC: 0 % (ref 0.0–0.2)

## 2020-04-06 LAB — MAGNESIUM: Magnesium: 2.7 mg/dL — ABNORMAL HIGH (ref 1.7–2.4)

## 2020-04-06 MED ORDER — IOHEXOL 9 MG/ML PO SOLN
500.0000 mL | ORAL | Status: AC
  Administered 2020-04-06: 500 mL via ORAL

## 2020-04-06 MED ORDER — IOHEXOL 12 MG/ML PO SOLN
500.0000 mL | ORAL | Status: DC
  Administered 2020-04-06: 300 mL via ORAL

## 2020-04-06 MED ORDER — SODIUM CHLORIDE 0.9 % IV SOLN
2.0000 g | Freq: Two times a day (BID) | INTRAVENOUS | Status: DC
  Administered 2020-04-06 – 2020-04-07 (×3): 2 g via INTRAVENOUS
  Filled 2020-04-06 (×2): qty 2

## 2020-04-06 MED ORDER — IOHEXOL 300 MG/ML  SOLN
100.0000 mL | Freq: Once | INTRAMUSCULAR | Status: AC | PRN
  Administered 2020-04-06: 100 mL via INTRAVENOUS

## 2020-04-06 NOTE — Progress Notes (Signed)
Subjective: Overnight the patient had some mild hematuria with some mild dysuria, this seemed to resolve over the course of the day.  We trialed intravaginal Valium, the patient was underwhelmed by its impact on her pain.  However, throughout the day her pain is been 3/10.   Objective: Vital signs in last 24 hours: Temp:  [97.4 F (36.3 C)-97.6 F (36.4 C)] 97.4 F (36.3 C) (10/30 2009) Pulse Rate:  [62-70] 62 (10/30 2009) Resp:  [16-19] 16 (10/30 2009) BP: (110-143)/(53-73) 143/73 (10/30 2009) SpO2:  [91 %-96 %] 96 % (10/30 2009)  Intake/Output from previous day: 10/30 0701 - 10/31 0700 In: 440 [P.O.:440] Out: 1000 [Urine:1000] Intake/Output this shift: No intake/output data recorded.  Physical Exam:  General:alert, cooperative and no distress GI: soft and tenderness: RLQ, no rebound tenderness no CVA tenderness Female genitalia: not done   Lab Results: Recent Labs    04/04/20 0446 04/05/20 0532 04/06/20 0622  HGB 10.1* 9.9* 10.4*  HCT 31.4* 30.6* 32.8*   BMET Recent Labs    04/05/20 0532 04/06/20 0622  NA 138 138  K 3.6 4.1  CL 106 104  CO2 23 26  GLUCOSE 158* 189*  BUN 24* 26*  CREATININE 0.68 0.77  CALCIUM 8.8* 8.8*   No results for input(s): LABPT, INR in the last 72 hours. No results for input(s): LABURIN in the last 72 hours. Results for orders placed or performed during the hospital encounter of 04/02/20  Respiratory Panel by RT PCR (Flu A&B, Covid) - Nasopharyngeal Swab     Status: None   Collection Time: 04/02/20  4:51 PM   Specimen: Nasopharyngeal Swab  Result Value Ref Range Status   SARS Coronavirus 2 by RT PCR NEGATIVE NEGATIVE Final    Comment: (NOTE) SARS-CoV-2 target nucleic acids are NOT DETECTED.  The SARS-CoV-2 RNA is generally detectable in upper respiratoy specimens during the acute phase of infection. The lowest concentration of SARS-CoV-2 viral copies this assay can detect is 131 copies/mL. A negative result does not preclude  SARS-Cov-2 infection and should not be used as the sole basis for treatment or other patient management decisions. A negative result may occur with  improper specimen collection/handling, submission of specimen other than nasopharyngeal swab, presence of viral mutation(s) within the areas targeted by this assay, and inadequate number of viral copies (<131 copies/mL). A negative result must be combined with clinical observations, patient history, and epidemiological information. The expected result is Negative.  Fact Sheet for Patients:  PinkCheek.be  Fact Sheet for Healthcare Providers:  GravelBags.it  This test is no t yet approved or cleared by the [place] FDA and  has been authorized for detection and/or diagnosis of SARS-CoV-2 by FDA under an Emergency Use Authorization (EUA). This EUA will remain  in effect (meaning this test can be used) for the duration of the COVID-19 declaration under Section 564(b)(1) of the Act, 21 U.S.C. section 360bbb-3(b)(1), unless the authorization is terminated or revoked sooner.     Influenza A by PCR NEGATIVE NEGATIVE Final   Influenza B by PCR NEGATIVE NEGATIVE Final    Comment: (NOTE) The Xpert Xpress SARS-CoV-2/FLU/RSV assay is intended as an aid in  the diagnosis of influenza from Nasopharyngeal swab specimens and  should not be used as a sole basis for treatment. Nasal washings and  aspirates are unacceptable for Xpert Xpress SARS-CoV-2/FLU/RSV  testing.  Fact Sheet for Patients: PinkCheek.be  Fact Sheet for Healthcare Providers: GravelBags.it  This test is not yet approved or cleared by  the [employer] and  has been authorized for detection and/or diagnosis of SARS-CoV-2 by  FDA under an Emergency Use Authorization (EUA). This EUA will remain  in effect (meaning this test can be used) for the duration of the   Covid-19 declaration under Section 564(b)(1) of the Act, 21  U.S.C. section 360bbb-3(b)(1), unless the authorization is  terminated or revoked. Performed at [facility], [place] [address], [place], [place] [address]   Urine culture     Status: Abnormal   Collection Time: 04/02/20  6:58 PM   Specimen: Urine, Random  Result Value Ref Range Status   Specimen Description   Final    URINE, RANDOM Performed at [facility] [address] [place], [place] [address]    Special Requests   Final    NONE Performed at [facility], [place] [address] [place], [place] [address]    Culture 20,000 COLONIES/mL PSEUDOMONAS AERUGINOSA (A)  Final   Report Status 04/05/2020 FINAL  Final   Organism ID, Bacteria PSEUDOMONAS AERUGINOSA (A)  Final      Susceptibility   Pseudomonas aeruginosa - MIC*    CEFTAZIDIME <=1 SENSITIVE Sensitive     CIPROFLOXACIN 2 INTERMEDIATE Intermediate     GENTAMICIN <=1 SENSITIVE Sensitive     IMIPENEM >=16 RESISTANT Resistant     PIP/TAZO <=4 SENSITIVE Sensitive     CEFEPIME 0.5 SENSITIVE Sensitive     * 20,000 COLONIES/mL PSEUDOMONAS AERUGINOSA    Studies/Results: No results found.  Assessment/Plan:  Right lower quadrant discomfort with history of recent ureteroscopy and findings of some ureteral edema and persistent hydronephrosis on CT scan 3 days ago.  She has had little pain over the past 36 hours.  She does have some mild voiding symptoms and she has had some hematuria, but her pain is been well managed.   Plan recommendation:  Given that she has had no pain over the past 36 hours I recommended that we stand down from the operating room and stent placement.  I think the patient can be discharged home.  The patient has morphine at home as well as ibuprofen which she can use if she gets into the pain crisis while at home.  She can also follow-up with either our office or Dr. Ernestina Penna office during the day for  more aggressive pain control.  Otherwise, she will have to return to the emergency department.    The patient will follow up with [place] in couple weeks for repeat ultrasound.  I do note that she had a Pseudomonas urine culture that I would recommend treatment for a total of 7 days.  We will sign off, please contact us with any additional questions or concerns.   LOS: 4 days   [name] 04/06/2020, 8:51 AM

## 2020-04-06 NOTE — Progress Notes (Signed)
PROGRESS NOTE    Melinda [name]   UEK:[id number]  DOB: 09-12-1960  DOA: 04/02/2020     4  PCP: Crist Infante, MD  CC: abdominal pain  Hospital Course: Melinda [name] is a 59 yo CF with PMH recurrent anal SCC (most recently treated with chemoradiation with mitomycin and 5-FU, last treatment 03/07/2020), DMII, HLD who presents to the ER with recurrent abdominal pain and nausea/vomiting at home. [name].  She was previously treated for radiation induced colitis earlier in October and a UTI.  Her most recent hospitalization was from 03/26/2020 to 04/01/2020 for obstructed right ureteral calculus with hydroureteronephrosis.  She underwent ureteroscopy with stone removal and laser lithotripsy and cystoscopy.  Distal ureteral stent was removed as kidney was noted to be draining well on the right side.  During that hospitalization she was treated with IV steroids, fluids, and pain medication.  She was not discharged with oral steroids due to anxiety associated with them.  She was also noted to have ileitis in the distal ileum.  She again presented to the ER with recurrent abdominal pain.  She states that on Tuesday she took some Colace then has 6 very soft/diarrheal bowel movements.  She then took some Imodium afterwards.  She states that she ate chocolate mousse at night which upset her stomach causing her to vomit.  Due to her recurrent abdominal pain she presented to the ER for evaluation once again.  She underwent repeat CT abdomen/pelvis on evaluation in the ER.  There was again noted to be distal and terminal ileitis, potentially treatment related.  There was moderate right hydroureteronephrosis to the level of the distal right pelvic ureter "similar to slightly worsened".  "Nonspecific urothelial wall thickening and hyperenhancement in the distal 2 to 3 cm of the right pelvic ureter, similar, probably inflammatory."  She was treated with Dilaudid, LR bolus, and  Zofran in the ER.  She is admitted for further evaluation and repeat consultation with urology.  Her pain seemed to localize to her RLQ as hospitalization continued. She had many reservations about nephrostomy tube placement.  Despite ongoing steroids and conservative measures her pain persisted. She underwent a trial of vaginal valium and toradol on 10/30.    Interval History:  We had a long discussion again this morning regarding her abdominal pain and next steps.  She was tearful today with frustration regarding what to do.  She seems to be so nervous and against procedures but at the same time also says that she would consider them if truly needed.  Then again, told her that due to the ambiguity of her symptoms with multiple image findings, it has been hard to know the best course of action at this time.  Overall, she does agree that she has been getting better slowly with conservative management. After our long discussion, we essentially agreed that we would pursue repeat CT abdomen/pelvis.  If there was evidence to suggest that she needs a procedure done, she would then be more agreeable to it.  If it shows stable or improvement of the previous findings then she will also be okay with ongoing conservative management and monitoring. She was tearful and thankful at the end of our conversation.   Old records reviewed in assessment of this patient  ROS: Constitutional: negative for chills and fevers, Respiratory: negative for cough, Cardiovascular: negative for chest pain and Gastrointestinal: positive for abdominal pain  Assessment & Plan: * Abdominal pain - differential for etiology  includes terminal ileitis (?Radiation-associated) versus right ureteral thickening with associated hydroureteronephrosis -Appreciate urology consult.  Her abdominal pain was more diffuse on admission and now is localizing to RLQ; throughout hospitalization this pain seems to have slowly but consistently improved  little by little each day; this has been mainly due to supportive care with minimal use of opiates.  Did have trial of vaginal Valium and Toradol on 04/05/2020 with no significant difference in her symptoms she says.  Regardless, she has continued to improve.  Starting her on antibiotics on 04/05/2020 after she began describing symptoms and urine culture grew Pseudomonas also seems to have helped her dysuria and pain; so this also may have been contributing. -She has a lot of anxiety associated with her pain as well as due to multiple recent hospitalizations.  We have had long bedside discussions each day since admission.  She is confused and feels helpless in regards to what she should do (stent versus nephrostomy tube versus further monitoring) -She has a lot of anxiety associated with wanting to know if the ileitis and/or ureteral thickening is improving.  I discussed at length with her asking her what we would do with certain CT findings if we did repeat it.  At this time, she understands that if it shows no change or improvement, then she would be okay with further supportive care outpatient and continuing to monitor.  If it did show any worsening of the known issues, then she would also be okay finally agreeing to a stent or nephrostomy tube if recommended by urology versus any other recommendation; thus, reasonable to repeat CT abd/pelvis at this point after discussing the benefits and tentative plan from re-scanning; this also seems to bring her a lot of reassurance as her biggest concern is returning to the hospital again after discharge if having ongoing pain - I'm going to also stop solu-medrol today and see response; plus follow up CT  UTI (urinary tract infection) -Urine culture updated 10/30 with Pseudomonas, 20k colonies.  Despite colony count, in conjunction with her significant dysuria she is describing on 10/30, reasonable to treat - Update, on 10/31 she notes good improvement in her  dysuria, therefore this may also have been playing a role in some of her pain -Allergy list and sensitivities limit options. Appreciate pharmacy assistance. - continue cefepime. Urology note seen. Will treat for 7 days total (not sure if there is a option for oral agent; cipro intermediate, so may need to stay inpatient until course is completed)  Hydroureteronephrosis -unclear if contributing to abdominal pain; recent stone and stent removal; CT shows nonspecific urothelial wall thickening and hyperenhancement of the distal 2 to 3 cm of the right pelvic ureter - appreciate urology following along; see abd pain as well   Recurrent anal squamous cell carcinoma (HCC) - most recently treated with chemoradiation with mitomycin and 5-FU, last treatment 03/07/2020 -Follows with Dr. Burr Medico; appreciate assistance as well in the management   Diabetes mellitus ([place]) - continue SSI and CBG monitoring   Antimicrobials: none  DVT prophylaxis: SCD (patient declined lovenox) Code Status: Full Family Communication: none present Disposition Plan: Status is: Inpatient  Remains inpatient appropriate because:Ongoing diagnostic testing needed not appropriate for outpatient work up, Unsafe d/c plan, IV treatments appropriate due to intensity of illness or inability to take PO and Inpatient level of care appropriate due to severity of illness   Dispo: The patient is from: Home  Anticipated d/c is to: Home              Anticipated d/c date is: 2 days or longer if needing to complete IV abx in hospital               Patient currently is not medically stable to d/c.   Objective: Blood pressure (!) 143/73, pulse 62, temperature (!) 97.4 F (36.3 C), temperature source Oral, resp. rate 16, height 5\' 6"  (1.676 m), weight 65 kg, SpO2 96 %.  Examination: General appearance: Pleasant but tearful at times, adult woman lying in bed appearing uncomfortable but in no obvious distress Head: Normocephalic,  without obvious abnormality, atraumatic Eyes: EOMI Lungs: clear to auscultation bilaterally Heart: regular rate and rhythm and S1, S2 normal Abdomen: Soft. The TTP in RLQ is improved today (much less). No R/G. BS present  Extremities: No edema Skin: mobility and turgor normal Neurologic: Grossly normal  Consultants:   Urology  Oncology   Procedures:   none  Data Reviewed: I have personally reviewed following labs and imaging studies Results for orders placed or performed during the hospital encounter of 04/02/20 (from the past 24 hour(s))  Glucose, capillary     Status: Abnormal   Collection Time: 04/05/20  3:53 PM  Result Value Ref Range   Glucose-Capillary 238 (H) 70 - 99 mg/dL  Glucose, capillary     Status: Abnormal   Collection Time: 04/05/20  8:07 PM  Result Value Ref Range   Glucose-Capillary 119 (H) 70 - 99 mg/dL  Glucose, capillary     Status: Abnormal   Collection Time: 04/06/20 12:09 AM  Result Value Ref Range   Glucose-Capillary 158 (H) 70 - 99 mg/dL  Glucose, capillary     Status: Abnormal   Collection Time: 04/06/20  4:04 AM  Result Value Ref Range   Glucose-Capillary 190 (H) 70 - 99 mg/dL  Basic metabolic panel     Status: Abnormal   Collection Time: 04/06/20  6:22 AM  Result Value Ref Range   Sodium 138 135 - 145 mmol/L   Potassium 4.1 3.5 - 5.1 mmol/L   Chloride 104 98 - 111 mmol/L   CO2 26 22 - 32 mmol/L   Glucose, Bld 189 (H) 70 - 99 mg/dL   BUN 26 (H) 8 - 23 mg/dL   Creatinine, Ser 0.77 0.44 - 1.00 mg/dL   Calcium 8.8 (L) 8.9 - 10.3 mg/dL   GFR, Estimated >60 >60 mL/min   Anion gap 8 5 - 15  CBC with Differential/Platelet     Status: Abnormal   Collection Time: 04/06/20  6:22 AM  Result Value Ref Range   WBC 3.5 (L) 4.0 - 10.5 K/uL   RBC 3.78 (L) 3.87 - 5.11 MIL/uL   Hemoglobin 10.4 (L) 12.0 - 15.0 g/dL   HCT 32.8 (L) 36 - 46 %   MCV 86.8 80.0 - 100.0 fL   MCH 27.5 26.0 - 34.0 pg   MCHC 31.7 30.0 - 36.0 g/dL   RDW 15.7 (H) 11.5 - 15.5 %     Platelets 156 150 - 400 K/uL   nRBC 0.0 0.0 - 0.2 %   Neutrophils Relative % 79 %   Neutro Abs 2.7 1.7 - 7.7 K/uL   Lymphocytes Relative 10 %   Lymphs Abs 0.4 (L) 0.7 - 4.0 K/uL   Monocytes Relative 5 %   Monocytes Absolute 0.2 0.1 - 1.0 K/uL   Eosinophils Relative 0 %   Eosinophils Absolute 0.0 0.0 -  0.5 K/uL   Basophils Relative 0 %   Basophils Absolute 0.0 0.0 - 0.1 K/uL   Immature Granulocytes 6 %   Abs Immature Granulocytes 0.19 (H) 0.00 - 0.07 K/uL   Polychromasia PRESENT   Magnesium     Status: Abnormal   Collection Time: 04/06/20  6:22 AM  Result Value Ref Range   Magnesium 2.7 (H) 1.7 - 2.4 mg/dL  Glucose, capillary     Status: Abnormal   Collection Time: 04/06/20  7:30 AM  Result Value Ref Range   Glucose-Capillary 163 (H) 70 - 99 mg/dL  Glucose, capillary     Status: Abnormal   Collection Time: 04/06/20 12:44 PM  Result Value Ref Range   Glucose-Capillary 163 (H) 70 - 99 mg/dL    Recent Results (from the past 240 hour(s))  Respiratory Panel by RT PCR (Flu A&B, Covid) - Nasopharyngeal Swab     Status: None   Collection Time: 04/02/20  4:51 PM   Specimen: Nasopharyngeal Swab  Result Value Ref Range Status   SARS Coronavirus 2 by RT PCR NEGATIVE NEGATIVE Final    Comment: (NOTE) SARS-CoV-2 target nucleic acids are NOT DETECTED.  The SARS-CoV-2 RNA is generally detectable in upper respiratoy specimens during the acute phase of infection. The lowest concentration of SARS-CoV-2 viral copies this assay can detect is 131 copies/mL. A negative result does not preclude SARS-Cov-2 infection and should not be used as the sole basis for treatment or other patient management decisions. A negative result may occur with  improper specimen collection/handling, submission of specimen other than nasopharyngeal swab, presence of viral mutation(s) within the areas targeted by this assay, and inadequate number of viral copies (<131 copies/mL). A negative result must be combined  with clinical observations, patient history, and epidemiological information. The expected result is Negative.  Fact Sheet for Patients:  PinkCheek.be  Fact Sheet for Healthcare Providers:  GravelBags.it  This test is no t yet approved or cleared by the [place] FDA and  has been authorized for detection and/or diagnosis of SARS-CoV-2 by FDA under an Emergency Use Authorization (EUA). This EUA will remain  in effect (meaning this test can be used) for the duration of the COVID-19 declaration under Section 564(b)(1) of the Act, 21 U.S.C. section 360bbb-3(b)(1), unless the authorization is terminated or revoked sooner.     Influenza A by PCR NEGATIVE NEGATIVE Final   Influenza B by PCR NEGATIVE NEGATIVE Final    Comment: (NOTE) The Xpert Xpress SARS-CoV-2/FLU/RSV assay is intended as an aid in  the diagnosis of influenza from Nasopharyngeal swab specimens and  should not be used as a sole basis for treatment. Nasal washings and  aspirates are unacceptable for Xpert Xpress SARS-CoV-2/FLU/RSV  testing.  Fact Sheet for Patients: PinkCheek.be  Fact Sheet for Healthcare Providers: GravelBags.it  This test is not yet approved or cleared by the [place] FDA and  has been authorized for detection and/or diagnosis of SARS-CoV-2 by  FDA under an Emergency Use Authorization (EUA). This EUA will remain  in effect (meaning this test can be used) for the duration of the  Covid-19 declaration under Section 564(b)(1) of the Act, 21  U.S.C. section 360bbb-3(b)(1), unless the authorization is  terminated or revoked. Performed at [facility], [place] [address] [place], [place] [address]   Urine culture     Status: Abnormal   Collection Time: 04/02/20  6:58 PM   Specimen: Urine, Random  Result Value Ref Range Status   Specimen  Description   Final     URINE, RANDOM Performed at [facility], [place] [address] [place], [place] [address]    Special Requests   Final    NONE Performed at [facility], [place] [address], [place], [place] [address]    Culture 20,000 COLONIES/mL PSEUDOMONAS AERUGINOSA (A)  Final   Report Status 04/05/2020 FINAL  Final   Organism ID, Bacteria PSEUDOMONAS AERUGINOSA (A)  Final      Susceptibility   Pseudomonas aeruginosa - MIC*    CEFTAZIDIME <=1 SENSITIVE Sensitive     CIPROFLOXACIN 2 INTERMEDIATE Intermediate     GENTAMICIN <=1 SENSITIVE Sensitive     IMIPENEM >=16 RESISTANT Resistant     PIP/TAZO <=4 SENSITIVE Sensitive     CEFEPIME 0.5 SENSITIVE Sensitive     * 20,000 COLONIES/mL PSEUDOMONAS AERUGINOSA     Radiology Studies: No results found. CT ABDOMEN PELVIS W CONTRAST  Final Result    CT ABDOMEN PELVIS W CONTRAST    (Results Pending)    Scheduled Meds: . clorazepate  7.5 mg Oral BID  . FLUoxetine  20 mg Oral q morning - 10a  . insulin aspart  0-20 Units Subcutaneous TID AC & HS  . iohexol  500 mL Oral Q1H  . propranolol  10 mg Oral BID  . rosuvastatin  5 mg Oral Daily  . sodium chloride flush  3 mL Intravenous Q12H  . tamsulosin  0.4 mg Oral Daily   PRN Meds: acetaminophen **OR** acetaminophen, diazepam, ketorolac, morphine injection, ondansetron **OR** ondansetron (ZOFRAN) IV, phenazopyridine Continuous Infusions: . ceFEPime (MAXIPIME) IV 1 g (04/06/20 0342)     LOS: 4 days  Time spent: Greater than 50% of the 35 minute visit was spent in counseling/coordination of care for the patient as laid out in the A&P.   Dwyane Dee, MD [facility] 04/06/2020, 12:51 PM

## 2020-04-07 ENCOUNTER — Inpatient Hospital Stay: Payer: BC Managed Care – PPO | Admitting: Hematology

## 2020-04-07 ENCOUNTER — Inpatient Hospital Stay: Payer: BC Managed Care – PPO

## 2020-04-07 DIAGNOSIS — R1031 Right lower quadrant pain: Secondary | ICD-10-CM | POA: Diagnosis not present

## 2020-04-07 LAB — CBC WITH DIFFERENTIAL/PLATELET
Abs Immature Granulocytes: 0.16 10*3/uL — ABNORMAL HIGH (ref 0.00–0.07)
Basophils Absolute: 0 10*3/uL (ref 0.0–0.1)
Basophils Relative: 0 %
Eosinophils Absolute: 0 10*3/uL (ref 0.0–0.5)
Eosinophils Relative: 1 %
HCT: 32.3 % — ABNORMAL LOW (ref 36.0–46.0)
Hemoglobin: 10.3 g/dL — ABNORMAL LOW (ref 12.0–15.0)
Immature Granulocytes: 6 %
Lymphocytes Relative: 10 %
Lymphs Abs: 0.3 10*3/uL — ABNORMAL LOW (ref 0.7–4.0)
MCH: 27.5 pg (ref 26.0–34.0)
MCHC: 31.9 g/dL (ref 30.0–36.0)
MCV: 86.4 fL (ref 80.0–100.0)
Monocytes Absolute: 0.3 10*3/uL (ref 0.1–1.0)
Monocytes Relative: 12 %
Neutro Abs: 1.9 10*3/uL (ref 1.7–7.7)
Neutrophils Relative %: 71 %
Platelets: 121 10*3/uL — ABNORMAL LOW (ref 150–400)
RBC: 3.74 MIL/uL — ABNORMAL LOW (ref 3.87–5.11)
RDW: 15.8 % — ABNORMAL HIGH (ref 11.5–15.5)
WBC: 2.6 10*3/uL — ABNORMAL LOW (ref 4.0–10.5)
nRBC: 0 % (ref 0.0–0.2)

## 2020-04-07 LAB — GLUCOSE, CAPILLARY
Glucose-Capillary: 102 mg/dL — ABNORMAL HIGH (ref 70–99)
Glucose-Capillary: 105 mg/dL — ABNORMAL HIGH (ref 70–99)
Glucose-Capillary: 199 mg/dL — ABNORMAL HIGH (ref 70–99)
Glucose-Capillary: 82 mg/dL (ref 70–99)
Glucose-Capillary: 92 mg/dL (ref 70–99)
Glucose-Capillary: 95 mg/dL (ref 70–99)

## 2020-04-07 LAB — BASIC METABOLIC PANEL
Anion gap: 8 (ref 5–15)
BUN: 27 mg/dL — ABNORMAL HIGH (ref 8–23)
CO2: 25 mmol/L (ref 22–32)
Calcium: 8.5 mg/dL — ABNORMAL LOW (ref 8.9–10.3)
Chloride: 106 mmol/L (ref 98–111)
Creatinine, Ser: 0.78 mg/dL (ref 0.44–1.00)
GFR, Estimated: >60 mL/min (ref >60)
Glucose, Bld: 95 mg/dL (ref 70–99)
Potassium: 3.3 mmol/L — ABNORMAL LOW (ref 3.5–5.1)
Sodium: 139 mmol/L (ref 135–145)

## 2020-04-07 LAB — MAGNESIUM: Magnesium: 2.4 mg/dL (ref 1.7–2.4)

## 2020-04-07 MED ORDER — CIPROFLOXACIN HCL 500 MG PO TABS
500.0000 mg | ORAL_TABLET | Freq: Two times a day (BID) | ORAL | Status: DC
  Administered 2020-04-07 – 2020-04-09 (×5): 500 mg via ORAL
  Filled 2020-04-07 (×5): qty 1

## 2020-04-07 MED ORDER — POTASSIUM CHLORIDE CRYS ER 20 MEQ PO TBCR
40.0000 meq | EXTENDED_RELEASE_TABLET | Freq: Once | ORAL | Status: AC
  Administered 2020-04-07: 40 meq via ORAL
  Filled 2020-04-07: qty 2

## 2020-04-07 MED ORDER — MORPHINE SULFATE (PF) 2 MG/ML IV SOLN
2.0000 mg | INTRAVENOUS | Status: DC | PRN
  Administered 2020-04-08 (×2): 2 mg via INTRAVENOUS
  Filled 2020-04-07 (×2): qty 1

## 2020-04-07 NOTE — Progress Notes (Signed)
HEMATOLOGY-ONCOLOGY PROGRESS NOTE  SUBJECTIVE: Patient overall is feeling better, has not had to severe pain, tolerating oral intake, and walked in the hallway today.  Oncology History Overview Note  Cancer Staging No matching staging information was found for the patient.    Squamous cell cancer, anus ([place])  07/13/2019 Procedure    EUA, Excision of posterior internal/external hemorrhoid under the care of Dr. Hassell Done    07/13/2019 Pathology Results   - Invasive moderately differentiated squamous cell carcinoma, 1.4 cm.  See comment  - Carcinoma invades for depth of 0.4 cm  - Deep resection margin is negative for carcinoma (0.2 cm)  - Lateral mucosal margin is positive for high-grade dysplasia  - No evidence of lymphovascular perineural invasion  Procedure: Local excision  Tumor Site: Anal canal  Tumor Size: 1.4 cm  Histologic Type: Invasive squamous cell carcinoma  Histologic Grade: G2: Moderately differentiated  Tumor Extension: Carcinoma invades superficial anal sphincter muscle  Margins: Uninvolved by tumor  Treatment Effect: N/A  Regional Lymph Nodes: No lymph nodes submitted or found  Pathologic Stage Classification (pTNM, AJCC 8th Edition):  pT1, pNx  Representative Tumor Block: A1  Comment(s): Lateral mucosal margin is positive for high-grade squamous  dysplasia      07/13/2019 Initial Diagnosis   Squamous cell cancer, anus ([place])   09/04/2019 Imaging   CT scan Chest, Abdomen, and Pelvis   IMPRESSION: 1. No evidence of metastatic disease in the chest, abdomen or pelvis. 2. No discrete anorectal mass. 3. Chronic findings include: Punctate nonobstructing upper right renal stones and chronic right renal scarring. 4. Aortic Atherosclerosis (ICD10-I70.0).   01/10/2020 Pathology Results   A. ANAL LESION, POSTERIOR MIDLINE, EXCISION:  - Squamous cell carcinoma, moderately differentiated.  Verbally reported by Dr. Saralyn Pilar 1.5 cm, negative margins, depth <1 mm   01/11/2020  Procedure   1. Excision of anal canal lesion (posterior midline) under the care of Dr. Dema Severin     01/28/2020 PET scan   IMPRESSION: 1. Mild focal anal hypermetabolism without discrete mass correlate on the CT images, nonspecific, differential includes postsurgical change or residual anal tumor. 2. No hypermetabolic locoregional or distant metastatic disease. 3. Chronic findings include: Aortic Atherosclerosis (ICD10-I70.0). Diffuse hepatic steatosis. Nonobstructing right nephrolithiasis.     02/04/2020 - 03/07/2020 Radiation Therapy   concurrent chemoRT by Dr Lisbeth Renshaw with Mitomycin and 5FU starting 02/04/20-03/07/20. The last 4 session cancelled due to poor toleration.    02/04/2020 - 03/03/2020 Chemotherapy   Concurrent chemoRT with Mitomycin and 5FU on week 1 and 5 starting 02/04/20-03/03/20   03/13/2020 Imaging   CT AP W contrast  IMPRESSION: Diffuse wall thickening with inflammatory changes involving a long segment of distal ileum, cecum, and sigmoid colon likely represents enteritis/colitis. This could be due to infectious, or inflammatory nature, not thought to represent ischemic colitis.   Hepatic steatosis   Trace pericardial effusion   Small free fluid in the deep pelvis     I have reviewed the past medical history, past surgical history, social history and family history with the patient and they are unchanged from previous note.   PHYSICAL EXAMINATION: ECOG PERFORMANCE STATUS: 3 - Symptomatic, >50% confined to bed  Vitals:   04/07/20 0547 04/07/20 1553  BP: (!) 144/80 106/69  Pulse: 65 74  Resp:  16  Temp: 97.7 F (36.5 C) 97.7 F (36.5 C)  SpO2: 94% 97%   Filed Weights   04/04/20 1300  Weight: 143 lb 4.8 oz (65 kg)    Intake/Output from previous  day: 10/31 0701 - 11/01 0700 In: 26 [IV Piggyback:203] Out: 1300 [Urine:1300]  GENERAL:alert, no distress and comfortable SKIN: skin color, texture, turgor are normal, no rashes or significant lesions LUNGS:  clear to auscultation and percussion with normal breathing effort HEART: regular rate & rhythm and no murmurs and no lower extremity edema ABDOMEN: Soft, tenderness to palpation to the right side of her abdomen and over the bladder NEURO: alert & oriented x 3 with fluent speech, no focal motor/sensory deficits  LABORATORY DATA:  I have reviewed the data as listed CMP Latest Ref Rng & Units 04/07/2020 04/06/2020 04/05/2020  Glucose 70 - 99 mg/dL 95 189(H) 158(H)  BUN 8 - 23 mg/dL 27(H) 26(H) 24(H)  Creatinine 0.44 - 1.00 mg/dL 0.78 0.77 0.68  Sodium 135 - 145 mmol/L 139 138 138  Potassium 3.5 - 5.1 mmol/L 3.3(L) 4.1 3.6  Chloride 98 - 111 mmol/L 106 104 106  CO2 22 - 32 mmol/L 25 26 23   Calcium 8.9 - 10.3 mg/dL 8.5(L) 8.8(L) 8.8(L)  Total Protein 6.5 - 8.1 g/dL - - -  Total Bilirubin 0.3 - 1.2 mg/dL - - -  Alkaline Phos 38 - 126 U/L - - -  AST 15 - 41 U/L - - -  ALT 0 - 44 U/L - - -    Lab Results  Component Value Date   WBC 2.6 (L) 04/07/2020   HGB 10.3 (L) 04/07/2020   HCT 32.3 (L) 04/07/2020   MCV 86.4 04/07/2020   PLT 121 (L) 04/07/2020   NEUTROABS 1.9 04/07/2020    CT ABDOMEN PELVIS W CONTRAST  Result Date: 04/06/2020 CLINICAL DATA:  Lower abdominal pain. History of ileitis and kidney stone. Patient has a history of anal cancer status post radiation therapy and chemotherapy. EXAM: CT ABDOMEN AND PELVIS WITH CONTRAST TECHNIQUE: Multidetector CT imaging of the abdomen and pelvis was performed using the standard protocol following bolus administration of intravenous contrast. CONTRAST:  158mL OMNIPAQUE IOHEXOL 300 MG/ML  SOLN COMPARISON:  CT abdomen pelvis dated 04/02/2020. FINDINGS: Lower chest: There are trace bilateral pleural effusions with associated atelectasis. Hepatobiliary: No focal liver abnormality is seen. The gallbladder is decompressed. No gallstones, gallbladder wall thickening, or biliary dilatation. Pancreas: Unremarkable. No pancreatic ductal dilatation or  surrounding inflammatory changes. Spleen: Normal in size without focal abnormality. Adrenals/Urinary Tract: Adrenal glands are unremarkable. Moderate right hydroureteronephrosis is not significantly changed since 03/28/2020, but is increased since 03/26/2020. Enhancement of the urothelial wall has decreased since 03/28/2020, however enhancement is persistent in the distal 2-3 cm of the right ureter as it inserts on the bladder. Chronic right renal cortical scarring is redemonstrated. A 9 mm hypoattenuating mass in the right kidney is not significantly changed since 09/04/2019 and likely represents a cyst, but is too small to characterize. The left kidney appears normal without focal lesion or hydronephrosis. The left ureter appears normal. Stomach/Bowel: Stomach is within normal limits. There is wall thickening and hyperenhancement of the distal ileum and terminal ileum, decreased since 04/02/2020. Surrounding inflammatory changes and free intraperitoneal fluid have also decreased (now small in volume). The patient is status post appendectomy. No new areas of inflammatory changes involving the bowel. No evidence of bowel obstruction. Vascular/Lymphatic: Aortic atherosclerosis. There is a retroaortic left renal vein. No enlarged abdominal or pelvic lymph nodes. Reproductive: Status post hysterectomy. No adnexal masses. Other: No abdominal wall hernia is identified. Musculoskeletal: Degenerative changes are seen in the spine. IMPRESSION: 1. Decreased wall thickening and hyperenhancement of the distal ileum and  terminal ileum with decreased surrounding inflammatory changes and free intraperitoneal fluid. 2. Moderate right hydroureteronephrosis is not significantly changed since 03/28/2020, however enhancement of the urothelial wall has decreased since 03/28/2020. 3. Trace bilateral pleural effusions with associated atelectasis. Aortic Atherosclerosis (ICD10-I70.0). Electronically Signed   By: Zerita Boers M.D.   On:  04/06/2020 15:24   CT ABDOMEN PELVIS W CONTRAST  Result Date: 04/02/2020 CLINICAL DATA:  Lower abdominal pain for almost 2 months with worsening since last night. Nausea, vomiting and diarrhea. 25 pound weight loss. History of anal cancer status post radiation therapy in February 2021 with completion of chemotherapy 03/07/2020. EXAM: CT ABDOMEN AND PELVIS WITH CONTRAST TECHNIQUE: Multidetector CT imaging of the abdomen and pelvis was performed using the standard protocol following bolus administration of intravenous contrast. CONTRAST:  166mL OMNIPAQUE IOHEXOL 300 MG/ML  SOLN COMPARISON:  03/28/2020 CT abdomen/pelvis. FINDINGS: Lower chest: Posterior right lower lobe 3 mm solid pulmonary nodule (series 6/image 11), unchanged from 03/28/2020 CT abdomen study. Hepatobiliary: Normal liver size. No liver mass. Normal gallbladder with no radiopaque cholelithiasis. No biliary ductal dilatation. Pancreas: Normal, with no mass or duct dilation. Spleen: Normal size. No mass. Adrenals/Urinary Tract: Normal adrenals. Moderate right hydroureteronephrosis to the level of distal right pelvic ureter, similar to slightly worsened since 03/28/2020 CT. Urothelial wall thickening and hyperenhancement in the distal 2-3 cm of the right pelvic ureter, similar. No left hydronephrosis. Normal caliber left ureter. Moderate renal cortical scarring throughout the upper right kidney is unchanged. Subcentimeter hypodense renal cortical lesion in the anterior interpolar right kidney is too small to characterize and unchanged. No new renal lesions. Tiny focus of gas in the nondependent bladder. No definite bladder wall thickening. Small calcification in the anterior midline bladder wall is unchanged. Stomach/Bowel: Small hiatal hernia. Otherwise normal nondistended stomach. Long segment of moderate contiguous wall thickening in the distal and terminal ileum with associated mucosal hyperenhancement and surrounding mesenteric fat stranding and  ill-defined fluid, similar to slightly improved in the interval. No new sites of small bowel wall thickening. No small bowel dilatation. Appendectomy. No definite large bowel wall thickening. No colonic diverticulosis. No acute pericolonic fat stranding. Large bowel is largely collapsed. Vascular/Lymphatic: Atherosclerotic nonaneurysmal abdominal aorta. Patent portal, splenic, hepatic and renal veins. Retroaortic left renal vein. No pathologically enlarged lymph nodes in the abdomen or pelvis. Reproductive: Status post hysterectomy, with no mass at the vaginal cuff. No adnexal mass. Other: No pneumoperitoneum. No focal fluid collections. Small volume free fluid in the pelvic cul-de-sac is similar. Musculoskeletal: No aggressive appearing focal osseous lesions. Moderate thoracolumbar spondylosis. IMPRESSION: 1. CT study is fairly similar to the CT study performed 5 days prior. 2. Nonspecific distal and terminal ileitis, potentially treatment related, similar to slightly improved. No bowel obstruction. No free air. 3. Moderate right hydroureteronephrosis to the level of the distal right pelvic ureter, similar to slightly worsened. Nonspecific urothelial wall thickening and hyperenhancement in the distal 2-3 cm of the right pelvic ureter, similar, probably inflammatory. Consider urology consultation. 4. Tiny focus of gas in the nondependent bladder, presumably due to recent bladder instrumentation. Correlate with urinalysis as clinically warranted to exclude acute cystitis. 5. Stable small volume free fluid in the pelvic cul-de-sac. 6. No lymphadenopathy or other findings suspicious for metastatic disease in the abdomen or pelvis. Right lung base 3 mm solid pulmonary nodule is stable and warrants continued chest CT follow-up. 7. Small hiatal hernia. 8. Aortic Atherosclerosis (ICD10-I70.0). Electronically Signed   By: Ilona Sorrel M.D.   On:  04/02/2020 17:21   CT ABDOMEN PELVIS W CONTRAST  Result Date:  03/28/2020 CLINICAL DATA:  Acute nonlocalized abdominal pain EXAM: CT ABDOMEN AND PELVIS WITH CONTRAST TECHNIQUE: Multidetector CT imaging of the abdomen and pelvis was performed using the standard protocol following bolus administration of intravenous contrast. CONTRAST:  140mL OMNIPAQUE IOHEXOL 300 MG/ML  SOLN COMPARISON:  Two days ago FINDINGS: Lower chest:  Trace pericardial fluid, stable. Hepatobiliary: No focal liver abnormality.No evidence of biliary obstruction or stone. Pancreas: Unremarkable. Spleen: Unremarkable. Adrenals/Urinary Tract: Negative adrenals. Progressive right hydroureteronephrosis despite passage of a stone there is ureteral thickening at the UVJ. No left hydronephrosis. Right renal cortical scarring affecting the upper pole. Punctate upper pole calculus on the right. Gas in the urinary bladder. Stomach/Bowel: Distal ileitis with submucosal low-density edema that is stable if not progressed. Small volume ascites is likely related. Appendectomy. History of anal cancer. No visible in a rectal mass. Vascular/Lymphatic: No acute vascular abnormality. Diffuse atheromatous plaque involving the aorta and iliacs. No mass or adenopathy. Reproductive:Hysterectomy. Other: Small volume reactive appearing ascites in the pelvis Musculoskeletal: No acute abnormalities. IMPRESSION: 1. Passed right ureteral calculus but progressive right hydroureteronephrosis in the setting of lower right ureteral thickening. 2. Ongoing distal ileitis which could be infectious, inflammatory, or radiation related. 3. Cortical scarring and small calculus of the right kidney. Electronically Signed   By: Monte Fantasia M.D.   On: 03/28/2020 07:14   CT ABDOMEN PELVIS W CONTRAST  Result Date: 03/26/2020 CLINICAL DATA:  Increasing abdominal pain since recent discharge EXAM: CT ABDOMEN AND PELVIS WITH CONTRAST TECHNIQUE: Multidetector CT imaging of the abdomen and pelvis was performed using the standard protocol following  bolus administration of intravenous contrast. CONTRAST:  172mL OMNIPAQUE IOHEXOL 300 MG/ML  SOLN COMPARISON:  03/13/2020 FINDINGS: Lower chest: No acute abnormality. Hepatobiliary: Probable hepatic steatosis. No focal liver lesion. Gallbladder is unremarkable. No biliary dilatation. Pancreas: Unremarkable. Spleen: Unremarkable. Adrenals/Urinary Tract: Adrenals unremarkable. Punctate nonobstructing calculus of the upper pole the right kidney. There is right upper pole cortical scarring. Mild right hydroureteronephrosis. This is secondary to an obstructing 3 mm calculus of the distal ureter, which was present on the prior study and has moved slightly inferiorly. Bladder is unremarkable. Stomach/Bowel: Stomach is within normal limits. There is persistent wall thickening involving the terminal ileum and adjacent distal ileum. Decreased cecal wall thickening. No bowel obstruction. Vascular/Lymphatic: Aortic atherosclerosis. No enlarged lymph nodes identified. Reproductive: Status post hysterectomy. No adnexal masses. Other: Small volume abdominopelvic free fluid. Abdominal wall is unremarkable. Musculoskeletal: No acute osseous abnormality. IMPRESSION: New mild right hydroureteronephrosis. Secondary to an obstructing 3 mm distal right ureteral calculus. This was present on the prior study and has moved slightly inferiorly/distally. Residual or recurrent infectious/inflammatory changes of the distal ileum. Decreased cecal wall thickening. Electronically Signed   By: Macy Mis M.D.   On: 03/26/2020 09:39   CT ABDOMEN PELVIS W CONTRAST  Result Date: 03/13/2020 CLINICAL DATA:  Abdominal pain and fever, history of squamous cell EXAM: CT ABDOMEN AND PELVIS WITH CONTRAST TECHNIQUE: Multidetector CT imaging of the abdomen and pelvis was performed using the standard protocol following bolus administration of intravenous contrast. CONTRAST:  138mL OMNIPAQUE IOHEXOL 300 MG/ML  SOLN COMPARISON:  September 04, 2019 FINDINGS:  Lower chest: The visualized heart size within normal limits. There is a trace pericardial effusion. No hiatal hernia. The visualized portions of the lungs are clear. Hepatobiliary: There is diffuse low density seen throughout the liver parenchyma. No focal hepatic lesion is seen. No  intra or extrahepatic biliary ductal dilatation. The main portal vein is patent. No evidence of calcified gallstones, gallbladder wall thickening or biliary dilatation. Pancreas: Unremarkable. No pancreatic ductal dilatation or surrounding inflammatory changes. Spleen: Normal in size without focal abnormality. Adrenals/Urinary Tract: Both adrenal glands appear normal. There is a punctate calcifications seen in the upper pole of the right kidney. There is areas of scarring seen within the upper pole. A 1 cm low-density lesion seen in the lower pole the right kidney. No left-sided renal or collecting system calculi. There is mild diffuse bladder wall thickening. Stomach/Bowel: The stomach and proximal small bowel are unremarkable. There is a long segment of ileum with diffuse wall thickening surrounding fat stranding changes to the terminal ileum. There is also wall thickening seen around the distal cecal pole. Mild wall thickening with surrounding fat stranding changes are seen around the sigmoid colon extending to the sigmoid rectal junction. No loculated fluid collections or free air however are noted. Vascular/Lymphatic: There are no enlarged mesenteric, retroperitoneal, or pelvic lymph nodes. Scattered aortic atherosclerotic calcifications are seen without aneurysmal dilatation. Reproductive: The patient is status post hysterectomy. No adnexal masses or collections seen. Other: A small amount of free fluid is seen within the deep cul-de-sac. Musculoskeletal: No acute or significant osseous findings. IMPRESSION: Diffuse wall thickening with inflammatory changes involving a long segment of distal ileum, cecum, and sigmoid colon likely  represents enteritis/colitis. This could be due to infectious, or inflammatory nature, not thought to represent ischemic colitis. Hepatic steatosis Trace pericardial effusion Small free fluid in the deep pelvis Electronically Signed   By: Prudencio Pair M.D.   On: 03/13/2020 21:30   DG C-Arm 1-60 Min-No Report  Result Date: 03/26/2020 Fluoroscopy was utilized by the requesting physician.  No radiographic interpretation.    ASSESSMENT AND PLAN: 1.  Recurrent abdominal pain and nausea 2.  Recurrent squamous cell carcinoma of the anus, pT1 N0 M0 3.  Obstructing right ureteral calculus with hydroureteronephrosis 4.  Ileitis and colitis  5. Anxiety and depression    Plan -she is making progress, off steroids now, but still quite anxious and nervous, concerned about recurrent severe pain at home after discharge. -She is likely going home tomorrow, will use pain medication as needed.  I told her to call me if she has severe abdominal pain and I will try to see her in my office.  It is okay to take morphine 15 to 30 mg for unbearable recurrent pain at home.  -I will see her back in my office later next week.   LOS: 5 days   Melinda [name] 04/07/20

## 2020-04-07 NOTE — Plan of Care (Signed)
Patient was able to tolerate oral antibiotic. Patient has not needed any narcotics for pain management today.

## 2020-04-07 NOTE — Progress Notes (Signed)
PROGRESS NOTE    Melinda [name]   HGD:[id number]  DOB: Mar 26, 1961  DOA: 04/02/2020     5  PCP: Crist Infante, MD  CC: abdominal pain  Hospital Course: Melinda [name] is a 59 yo CF with PMH recurrent anal SCC (most recently treated with chemoradiation with mitomycin and 5-FU, last treatment 03/07/2020), DMII, HLD who presents to the ER with recurrent abdominal pain and nausea/vomiting at home. [name].  She was previously treated for radiation induced colitis earlier in October and a UTI.  Her most recent hospitalization was from 03/26/2020 to 04/01/2020 for obstructed right ureteral calculus with hydroureteronephrosis.  She underwent ureteroscopy with stone removal and laser lithotripsy and cystoscopy.  Distal ureteral stent was removed as kidney was noted to be draining well on the right side.  During that hospitalization she was treated with IV steroids, fluids, and pain medication.  She was not discharged with oral steroids due to anxiety associated with them.  She was also noted to have ileitis in the distal ileum.  She again presented to the ER with recurrent abdominal pain.  She states that on Tuesday she took some Colace then has 6 very soft/diarrheal bowel movements.  She then took some Imodium afterwards.  She states that she ate chocolate mousse at night which upset her stomach causing her to vomit.  Due to her recurrent abdominal pain she presented to the ER for evaluation once again.  She underwent repeat CT abdomen/pelvis on evaluation in the ER.  There was again noted to be distal and terminal ileitis, potentially treatment related.  There was moderate right hydroureteronephrosis to the level of the distal right pelvic ureter "similar to slightly worsened".  "Nonspecific urothelial wall thickening and hyperenhancement in the distal 2 to 3 cm of the right pelvic ureter, similar, probably inflammatory."  She was treated with Dilaudid, LR bolus, and  Zofran in the ER.  She is admitted for further evaluation and repeat consultation with urology.  Her pain seemed to localize to her RLQ as hospitalization continued. She had many reservations about nephrostomy tube placement.  Despite ongoing steroids and conservative measures her pain persisted. She underwent a trial of vaginal valium and toradol on 10/30.   She had a repeat CT performed on 10/31 which showed improvement in ileitis and stable/slight improvement in the right ureter/collecting system. Due to this patient was comfortable with ongoing conservative and monitoring treatment.   She had also been started on cefepime for Pseudomonas UTI treatment given significant symptoms of dysuria when culture began growing.  Plan is for 7-day treatment course per urology recommendations as well.  She completed approximately 3 days IV cefepime inpatient and due to her allergy profile and resistance pattern noted on culture data (after discussion with ID pharmacist) she was deescalated to ciprofloxacin for monitoring over 24 hour period, and if tolerates, would be considered stable for discharging home to finish completing course.   Interval History:  She continues to feel better each day. Still has a lot of anxiety underlying in general which I believe plays a role in her comfort with going home and worrying about coming back if recurrent pain. I have tried reassuring her daily and provided empathetic listening.  Today, we decided to change to cipro PO and watch overnight; if tolerates well then d/c Tuesday to finish course outpatient. She is comfortable with this and as she's been feeling better slowly each day and reassured by the repeat CT, she is  amenable with tentative d/c Tuesday.   Old records reviewed in assessment of this patient  ROS: Constitutional: negative for chills and fevers, Respiratory: negative for cough, Cardiovascular: negative for chest pain and Gastrointestinal: positive for  abdominal pain  Assessment & Plan: * Abdominal pain - differential for etiology includes terminal ileitis (?Radiation-associated) versus right ureteral thickening with associated hydroureteronephrosis -Appreciate urology consult.  Her abdominal pain was more diffuse on admission and now is localizing to RLQ; throughout hospitalization this pain seems to have slowly but consistently improved little by little each day; this has been mainly due to supportive care with minimal use of opiates; also vaginal valium/toradol did not provide much relief or difference she says - in addition, started her on cefepime on 04/05/2020 after she began describing symptoms and urine culture grew Pseudomonas and she has continued to feel better since (see UTI) - lastly, we repeated CT on 10/31. This overall shows improvement of ileitis and stable/slight improvement in urinary issues so patient is okay with continuing conservative measures for now - outpatient follow up with urology - steroids stopped on 10/31  UTI (urinary tract infection) -Urine culture updated 10/30 with Pseudomonas, 20k colonies.  Despite colony count, in conjunction with her significant dysuria she is describing on 10/30, reasonable to treat - Update, on 10/31 she notes good improvement in her dysuria, therefore this may also have been playing a role in some of her pain - pain continued to improve by 11/1 - discussed with ID pharmacist; s/p 3 days cefepime and cipro concentrates well in urine (patient has multiple ambiguous allergies that she does not remember reactions).....therefore we will start cipro inpatient on 11/1, monitor overnight and if tolerates, then d/c home 11/2 to finish completing course ( 7 day total, to finish on 04/11/20)  Hydroureteronephrosis -unclear if contributing to abdominal pain; recent stone and stent removal; CT on admission shows nonspecific urothelial wall thickening and hyperenhancement of the distal 2 to 3 cm of the  right pelvic ureter - repeat CT on 10/31 does not show any worsening; patient okay with monitoring further and outpatient follow up   Recurrent anal squamous cell carcinoma ([place]) - most recently treated with chemoradiation with mitomycin and 5-FU, last treatment 03/07/2020 -Follows with Dr. Burr Medico; appreciate assistance as well in the management   Diabetes mellitus ([place]) - continue SSI and CBG monitoring   Antimicrobials: none  DVT prophylaxis: SCD (patient declined lovenox) Code Status: Full Family Communication: none present Disposition Plan: Status is: Inpatient  Remains inpatient appropriate because:Ongoing diagnostic testing needed not appropriate for outpatient work up, Unsafe d/c plan, IV treatments appropriate due to intensity of illness or inability to take PO and Inpatient level of care appropriate due to severity of illness   Dispo: The patient is from: Home              Anticipated d/c is to: Home              Anticipated d/c date is: Hopeful for Tuesday if tolerates cipro                Patient currently is not medically stable to d/c.   Objective: Blood pressure (!) 144/80, pulse 65, temperature 97.7 F (36.5 C), temperature source Oral, resp. rate 16, height 5\' 6"  (1.676 m), weight 65 kg, SpO2 94 %.  Examination: General appearance: More comfortable; pleasant; pain improved and feeling better than yesterday  Head: Normocephalic, without obvious abnormality, atraumatic Eyes: EOMI Lungs: clear to auscultation bilaterally Heart:  regular rate and rhythm and S1, S2 normal Abdomen: Soft. The TTP in RLQ is improved today (much less). No R/G. BS present  Extremities: No edema Skin: mobility and turgor normal Neurologic: Grossly normal  Consultants:   Urology  Oncology   Procedures:   none  Data Reviewed: I have personally reviewed following labs and imaging studies Results for orders placed or performed during the hospital encounter of 04/02/20 (from the past  24 hour(s))  Glucose, capillary     Status: Abnormal   Collection Time: 04/06/20  4:12 PM  Result Value Ref Range   Glucose-Capillary 155 (H) 70 - 99 mg/dL  Glucose, capillary     Status: Abnormal   Collection Time: 04/06/20  8:26 PM  Result Value Ref Range   Glucose-Capillary 141 (H) 70 - 99 mg/dL  Glucose, capillary     Status: None   Collection Time: 04/06/20 11:58 PM  Result Value Ref Range   Glucose-Capillary 98 70 - 99 mg/dL  Glucose, capillary     Status: None   Collection Time: 04/07/20  4:02 AM  Result Value Ref Range   Glucose-Capillary 95 70 - 99 mg/dL  Basic metabolic panel     Status: Abnormal   Collection Time: 04/07/20  5:02 AM  Result Value Ref Range   Sodium 139 135 - 145 mmol/L   Potassium 3.3 (L) 3.5 - 5.1 mmol/L   Chloride 106 98 - 111 mmol/L   CO2 25 22 - 32 mmol/L   Glucose, Bld 95 70 - 99 mg/dL   BUN 27 (H) 8 - 23 mg/dL   Creatinine, Ser 0.78 0.44 - 1.00 mg/dL   Calcium 8.5 (L) 8.9 - 10.3 mg/dL   GFR, Estimated >60 >60 mL/min   Anion gap 8 5 - 15  CBC with Differential/Platelet     Status: Abnormal   Collection Time: 04/07/20  5:02 AM  Result Value Ref Range   WBC 2.6 (L) 4.0 - 10.5 K/uL   RBC 3.74 (L) 3.87 - 5.11 MIL/uL   Hemoglobin 10.3 (L) 12.0 - 15.0 g/dL   HCT 32.3 (L) 36 - 46 %   MCV 86.4 80.0 - 100.0 fL   MCH 27.5 26.0 - 34.0 pg   MCHC 31.9 30.0 - 36.0 g/dL   RDW 15.8 (H) 11.5 - 15.5 %   Platelets 121 (L) 150 - 400 K/uL   nRBC 0.0 0.0 - 0.2 %   Neutrophils Relative % 71 %   Neutro Abs 1.9 1.7 - 7.7 K/uL   Lymphocytes Relative 10 %   Lymphs Abs 0.3 (L) 0.7 - 4.0 K/uL   Monocytes Relative 12 %   Monocytes Absolute 0.3 0.1 - 1.0 K/uL   Eosinophils Relative 1 %   Eosinophils Absolute 0.0 0.0 - 0.5 K/uL   Basophils Relative 0 %   Basophils Absolute 0.0 0.0 - 0.1 K/uL   WBC Morphology MILD LEFT SHIFT (1-5% METAS, OCC MYELO, OCC BANDS)    Immature Granulocytes 6 %   Abs Immature Granulocytes 0.16 (H) 0.00 - 0.07 K/uL   Polychromasia  PRESENT   Magnesium     Status: None   Collection Time: 04/07/20  5:02 AM  Result Value Ref Range   Magnesium 2.4 1.7 - 2.4 mg/dL  Glucose, capillary     Status: None   Collection Time: 04/07/20  9:29 AM  Result Value Ref Range   Glucose-Capillary 92 70 - 99 mg/dL   Comment 1 Notify RN    Comment 2 Document  in Chart   Glucose, capillary     Status: Abnormal   Collection Time: 04/07/20 12:56 PM  Result Value Ref Range   Glucose-Capillary 105 (H) 70 - 99 mg/dL   Comment 1 Notify RN    Comment 2 Document in Chart     Recent Results (from the past 240 hour(s))  Respiratory Panel by RT PCR (Flu A&B, Covid) - Nasopharyngeal Swab     Status: None   Collection Time: 04/02/20  4:51 PM   Specimen: Nasopharyngeal Swab  Result Value Ref Range Status   SARS Coronavirus 2 by RT PCR NEGATIVE NEGATIVE Final    Comment: (NOTE) SARS-CoV-2 target nucleic acids are NOT DETECTED.  The SARS-CoV-2 RNA is generally detectable in upper respiratoy specimens during the acute phase of infection. The lowest concentration of SARS-CoV-2 viral copies this assay can detect is 131 copies/mL. A negative result does not preclude SARS-Cov-2 infection and should not be used as the sole basis for treatment or other patient management decisions. A negative result may occur with  improper specimen collection/handling, submission of specimen other than nasopharyngeal swab, presence of viral mutation(s) within the areas targeted by this assay, and inadequate number of viral copies (<131 copies/mL). A negative result must be combined with clinical observations, patient history, and epidemiological information. The expected result is Negative.  Fact Sheet for Patients:  PinkCheek.be  Fact Sheet for Healthcare Providers:  GravelBags.it  This test is no t yet approved or cleared by the [place] FDA and  has been authorized for detection and/or diagnosis  of SARS-CoV-2 by FDA under an Emergency Use Authorization (EUA). This EUA will remain  in effect (meaning this test can be used) for the duration of the COVID-19 declaration under Section 564(b)(1) of the Act, 21 U.S.C. section 360bbb-3(b)(1), unless the authorization is terminated or revoked sooner.     Influenza A by PCR NEGATIVE NEGATIVE Final   Influenza B by PCR NEGATIVE NEGATIVE Final    Comment: (NOTE) The Xpert Xpress SARS-CoV-2/FLU/RSV assay is intended as an aid in  the diagnosis of influenza from Nasopharyngeal swab specimens and  should not be used as a sole basis for treatment. Nasal washings and  aspirates are unacceptable for Xpert Xpress SARS-CoV-2/FLU/RSV  testing.  Fact Sheet for Patients: PinkCheek.be  Fact Sheet for Healthcare Providers: GravelBags.it  This test is not yet approved or cleared by the [place] FDA and  has been authorized for detection and/or diagnosis of SARS-CoV-2 by  FDA under an Emergency Use Authorization (EUA). This EUA will remain  in effect (meaning this test can be used) for the duration of the  Covid-19 declaration under Section 564(b)(1) of the Act, 21  U.S.C. section 360bbb-3(b)(1), unless the authorization is  terminated or revoked. Performed at [facility], [place] [address] [place], [place] [address]   Urine culture     Status: Abnormal   Collection Time: 04/02/20  6:58 PM   Specimen: Urine, Random  Result Value Ref Range Status   Specimen Description   Final    URINE, RANDOM Performed at [facility] [address] [place], [place] [address]    Special Requests   Final    NONE Performed at [facility], [place] [address] [place], [place] [address]    Culture 20,000 COLONIES/mL PSEUDOMONAS AERUGINOSA (A)  Final   Report Status 04/05/2020 FINAL  Final   Organism ID, Bacteria PSEUDOMONAS AERUGINOSA (A)   Final  Susceptibility   Pseudomonas aeruginosa - MIC*    CEFTAZIDIME <=1 SENSITIVE Sensitive     CIPROFLOXACIN 2 INTERMEDIATE Intermediate     GENTAMICIN <=1 SENSITIVE Sensitive     IMIPENEM >=16 RESISTANT Resistant     PIP/TAZO <=4 SENSITIVE Sensitive     CEFEPIME 0.5 SENSITIVE Sensitive     * 20,000 COLONIES/mL PSEUDOMONAS AERUGINOSA     Radiology Studies: CT ABDOMEN PELVIS W CONTRAST  Result Date: 04/06/2020 CLINICAL DATA:  Lower abdominal pain. History of ileitis and kidney stone. Patient has a history of anal cancer status post radiation therapy and chemotherapy. EXAM: CT ABDOMEN AND PELVIS WITH CONTRAST TECHNIQUE: Multidetector CT imaging of the abdomen and pelvis was performed using the standard protocol following bolus administration of intravenous contrast. CONTRAST:  174mL OMNIPAQUE IOHEXOL 300 MG/ML  SOLN COMPARISON:  CT abdomen pelvis dated 04/02/2020. FINDINGS: Lower chest: There are trace bilateral pleural effusions with associated atelectasis. Hepatobiliary: No focal liver abnormality is seen. The gallbladder is decompressed. No gallstones, gallbladder wall thickening, or biliary dilatation. Pancreas: Unremarkable. No pancreatic ductal dilatation or surrounding inflammatory changes. Spleen: Normal in size without focal abnormality. Adrenals/Urinary Tract: Adrenal glands are unremarkable. Moderate right hydroureteronephrosis is not significantly changed since 03/28/2020, but is increased since 03/26/2020. Enhancement of the urothelial wall has decreased since 03/28/2020, however enhancement is persistent in the distal 2-3 cm of the right ureter as it inserts on the bladder. Chronic right renal cortical scarring is redemonstrated. A 9 mm hypoattenuating mass in the right kidney is not significantly changed since 09/04/2019 and likely represents a cyst, but is too small to characterize. The left kidney appears normal without focal lesion or hydronephrosis. The left ureter appears  normal. Stomach/Bowel: Stomach is within normal limits. There is wall thickening and hyperenhancement of the distal ileum and terminal ileum, decreased since 04/02/2020. Surrounding inflammatory changes and free intraperitoneal fluid have also decreased (now small in volume). The patient is status post appendectomy. No new areas of inflammatory changes involving the bowel. No evidence of bowel obstruction. Vascular/Lymphatic: Aortic atherosclerosis. There is a retroaortic left renal vein. No enlarged abdominal or pelvic lymph nodes. Reproductive: Status post hysterectomy. No adnexal masses. Other: No abdominal wall hernia is identified. Musculoskeletal: Degenerative changes are seen in the spine. IMPRESSION: 1. Decreased wall thickening and hyperenhancement of the distal ileum and terminal ileum with decreased surrounding inflammatory changes and free intraperitoneal fluid. 2. Moderate right hydroureteronephrosis is not significantly changed since 03/28/2020, however enhancement of the urothelial wall has decreased since 03/28/2020. 3. Trace bilateral pleural effusions with associated atelectasis. Aortic Atherosclerosis (ICD10-I70.0). Electronically Signed   By: Zerita Boers M.D.   On: 04/06/2020 15:24   CT ABDOMEN PELVIS W CONTRAST  Final Result    CT ABDOMEN PELVIS W CONTRAST  Final Result      Scheduled Meds: . ciprofloxacin  500 mg Oral BID  . clorazepate  7.5 mg Oral BID  . FLUoxetine  20 mg Oral q morning - 10a  . insulin aspart  0-20 Units Subcutaneous TID AC & HS  . propranolol  10 mg Oral BID  . rosuvastatin  5 mg Oral Daily  . sodium chloride flush  3 mL Intravenous Q12H  . tamsulosin  0.4 mg Oral Daily   PRN Meds: acetaminophen **OR** acetaminophen, diazepam, ketorolac, morphine injection, ondansetron **OR** ondansetron (ZOFRAN) IV, phenazopyridine Continuous Infusions:    LOS: 5 days  Time spent: Greater than 50% of the 35 minute visit was spent in counseling/coordination of  care for the  patient as laid out in the A&P.   Dwyane Dee, MD [facility] 04/07/2020, 2:02 PM

## 2020-04-08 DIAGNOSIS — R1030 Lower abdominal pain, unspecified: Secondary | ICD-10-CM

## 2020-04-08 DIAGNOSIS — D696 Thrombocytopenia, unspecified: Secondary | ICD-10-CM

## 2020-04-08 DIAGNOSIS — E119 Type 2 diabetes mellitus without complications: Secondary | ICD-10-CM

## 2020-04-08 DIAGNOSIS — A498 Other bacterial infections of unspecified site: Secondary | ICD-10-CM

## 2020-04-08 LAB — CBC WITH DIFFERENTIAL/PLATELET
Abs Immature Granulocytes: 0.12 10*3/uL — ABNORMAL HIGH (ref 0.00–0.07)
Basophils Absolute: 0 10*3/uL (ref 0.0–0.1)
Basophils Relative: 0 %
Eosinophils Absolute: 0 10*3/uL (ref 0.0–0.5)
Eosinophils Relative: 0 %
HCT: 33.9 % — ABNORMAL LOW (ref 36.0–46.0)
Hemoglobin: 10.6 g/dL — ABNORMAL LOW (ref 12.0–15.0)
Immature Granulocytes: 4 %
Lymphocytes Relative: 7 %
Lymphs Abs: 0.2 10*3/uL — ABNORMAL LOW (ref 0.7–4.0)
MCH: 27.7 pg (ref 26.0–34.0)
MCHC: 31.3 g/dL (ref 30.0–36.0)
MCV: 88.5 fL (ref 80.0–100.0)
Monocytes Absolute: 0.3 10*3/uL (ref 0.1–1.0)
Monocytes Relative: 9 %
Neutro Abs: 2.4 10*3/uL (ref 1.7–7.7)
Neutrophils Relative %: 80 %
Platelets: 95 10*3/uL — ABNORMAL LOW (ref 150–400)
RBC: 3.83 MIL/uL — ABNORMAL LOW (ref 3.87–5.11)
RDW: 16 % — ABNORMAL HIGH (ref 11.5–15.5)
WBC: 3 10*3/uL — ABNORMAL LOW (ref 4.0–10.5)
nRBC: 0 % (ref 0.0–0.2)

## 2020-04-08 LAB — GLUCOSE, CAPILLARY
Glucose-Capillary: 99 mg/dL (ref 70–99)
Glucose-Capillary: 117 mg/dL — ABNORMAL HIGH (ref 70–99)
Glucose-Capillary: 96 mg/dL (ref 70–99)
Glucose-Capillary: 92 mg/dL (ref 70–99)
Glucose-Capillary: 165 mg/dL — ABNORMAL HIGH (ref 70–99)

## 2020-04-08 LAB — COMPREHENSIVE METABOLIC PANEL
ALT: 29 U/L (ref 0–44)
AST: 23 U/L (ref 15–41)
Albumin: 2.8 g/dL — ABNORMAL LOW (ref 3.5–5.0)
Alkaline Phosphatase: 40 U/L (ref 38–126)
Anion gap: 10 (ref 5–15)
BUN: 22 mg/dL (ref 8–23)
CO2: 23 mmol/L (ref 22–32)
Calcium: 8.3 mg/dL — ABNORMAL LOW (ref 8.9–10.3)
Chloride: 104 mmol/L (ref 98–111)
Creatinine, Ser: 0.66 mg/dL (ref 0.44–1.00)
GFR, Estimated: >60 mL/min (ref >60)
Glucose, Bld: 94 mg/dL (ref 70–99)
Potassium: 4 mmol/L (ref 3.5–5.1)
Sodium: 137 mmol/L (ref 135–145)
Total Bilirubin: 0.8 mg/dL (ref 0.3–1.2)
Total Protein: 5.5 g/dL — ABNORMAL LOW (ref 6.5–8.1)

## 2020-04-08 LAB — PHOSPHORUS: Phosphorus: 3.3 mg/dL (ref 2.5–4.6)

## 2020-04-08 LAB — MAGNESIUM: Magnesium: 2.2 mg/dL (ref 1.7–2.4)

## 2020-04-08 MED ORDER — MORPHINE SULFATE 15 MG PO TABS
15.0000 mg | ORAL_TABLET | Freq: Three times a day (TID) | ORAL | Status: DC | PRN
  Administered 2020-04-09 (×2): 15 mg via ORAL
  Filled 2020-04-08 (×2): qty 1

## 2020-04-08 MED ORDER — POLYETHYLENE GLYCOL 3350 17 G PO PACK: 17.0000 g | PACK | Freq: Every day | ORAL | Status: DC

## 2020-04-08 NOTE — Progress Notes (Signed)
PROGRESS NOTE    Melinda [name]  ZOX:[id number] DOB: [birth year] DOA: 04/02/2020 PCP: Crist Infante, MD     Brief Narrative:  59 yo WF PMHx recurrent anal SCC (most recently treated with chemoradiation with mitomycin and 5-FU, last treatment 03/07/2020), DMII, HLD   who presents to the ER with recurrent abdominal pain and nausea/vomiting at home. [name].  She was previously treated for radiation induced colitis earlier in October and a UTI.  Her most recent hospitalization was from 03/26/2020 to 04/01/2020 for obstructed right ureteral calculus with hydroureteronephrosis.  She underwent ureteroscopy with stone removal and laser lithotripsy and cystoscopy.  Distal ureteral stent was removed as kidney was noted to be draining well on the right side.  During that hospitalization she was treated with IV steroids, fluids, and pain medication.  She was not discharged with oral steroids due to anxiety associated with them.  She was also noted to have ileitis in the distal ileum.  She again presented to the ER with recurrent abdominal pain.  She states that on Tuesday she took some Colace then has 6 very soft/diarrheal bowel movements.  She then took some Imodium afterwards.  She states that she ate chocolate mousse at night which upset her stomach causing her to vomit.  Due to her recurrent abdominal pain she presented to the ER for evaluation once again.  She underwent repeat CT abdomen/pelvis on evaluation in the ER.  There was again noted to be distal and terminal ileitis, potentially treatment related.  There was moderate right hydroureteronephrosis to the level of the distal right pelvic ureter "similar to slightly worsened".  "Nonspecific urothelial wall thickening and hyperenhancement in the distal 2 to 3 cm of the right pelvic ureter, similar, probably inflammatory."  She was treated with Dilaudid, LR bolus, and Zofran in the ER.  She is admitted for further  evaluation and repeat consultation with urology.  Her pain seemed to localize to her RLQ as hospitalization continued. She had many reservations about nephrostomy tube placement.  Despite ongoing steroids and conservative measures her pain persisted. She underwent a trial of vaginal valium and toradol on 10/30.   She had a repeat CT performed on 10/31 which showed improvement in ileitis and stable/slight improvement in the right ureter/collecting system. Due to this patient was comfortable with ongoing conservative and monitoring treatment.   She had also been started on cefepime for Pseudomonas UTI treatment given significant symptoms of dysuria when culture began growing.  Plan is for 7-day treatment course per urology recommendations as well.  She completed approximately 3 days IV cefepime inpatient and due to her allergy profile and resistance pattern noted on culture data (after discussion with ID pharmacist) she was deescalated to ciprofloxacin for monitoring over 24 hour period, and if tolerates, would be considered stable for discharging home to finish completing course.   Interval History:  She continues to feel better each day. Still has a lot of anxiety underlying in general which I believe plays a role in her comfort with going home and worrying about coming back if recurrent pain. I have tried reassuring her daily and provided empathetic listening.  Today, we decided to change to cipro PO and watch overnight; if tolerates well then d/c Tuesday to finish course outpatient. She is comfortable with this and as she's been feeling better slowly each day and reassured by the repeat CT, she is amenable with tentative d/c Tuesday.    Subjective: Afebrile overnight, A/O  x4, patient continues to have episodes of extreme right-sided abdominal pain.  Last episode this morning which was relieved with IV narcotics.  Patient continues to be extremely anxious concerning future  exacerbations.   Assessment & Plan: Covid vaccination;   Principal Problem:   Abdominal pain Active Problems:   Diabetes mellitus ([place])   Hydroureteronephrosis   Recurrent anal squamous cell carcinoma (HCC)   UTI (urinary tract infection)  Acute abdominal pain -Multifactorial terminal ileitis, RIGHT ureteral thickening with associated hydronephrosis, pseudomonal UTI. -Treat underlying causes -Patient pain now localized RLQ abdomen. -Repeat CT on 10/31. This overall shows improvement of ileitis -11/2 patient has been continued on IV narcotics last use was today therefore not ready for discharge. -Long conversation with patient and husband explained that she was at increased risk for future episodes of ileitis and nephrolithiasis. -11/2 DC all IV pain medication -11/2 restart home pain regimen and if patient tolerates overnight then discharge  Pseudomonal UTI -Per EMR [facility] physician discussed case with ID pharmacist; s/p 3 days cefepime and cipro concentrates well in urine (patient has multiple ambiguous allergies that she does not remember reactions).....therefore we will start Ciprofloxacin inpatient on 11/1( 7 day total, to finish on 04/11/20) -Complete 7-day course antibiotics  Hydroureteronephrosis -CT on admission shows nonspecific urothelial wall thickening and hyperenhancement of the distal 2 to 3 cm of the right pelvic ureter, may be contributing to current pain. -Repeat CT on 10/31 does not show any worsening; patient okay with monitoring further and outpatient follow up  -Schedule follow-up with Dr. Louis Meckel urology in 1 to 2 weeks, or sooner if pain management needed per his note on 10/31  Recurrent anal squamous cell carcinoma -Patient seen by Dr. Truitt Merle oncology -most recently treated with chemoradiation with mitomycin and 5-FU, last treatment 03/07/2020 -Schedule follow-up Dr. Truitt Merle oncology in 1 week  DM type II controlled without complication -4/76  hemoglobin A1c= 5.7 -Not on any home medication. -While hospitalized resistant SSI -Lipid panel pending  Thrombocytopenia -Most likely secondary to infection and recent chemotherapy.    DVT prophylaxis: SCD (thrombocytopenia) Code Status: Full Family Communication: 11/2 husband at bedside for discussion of plan of care answered all questions Status is: Inpatient    Dispo: The patient is from: Home              Anticipated d/c is to: Home              Anticipated d/c date is: 11/3              Patient currently unstable      Consultants:  Oncology Urology   Procedures/Significant Events:    I have personally reviewed and interpreted all radiology studies and my findings are as above.  VENTILATOR SETTINGS:    Cultures 10/27 SARS coronavirus negative 10/27 influenza A/B negative 10/27 urine positive Pseudomonas aeruginosa   Antimicrobials: Anti-infectives (From admission, onward)   Start     Ordered Stop   04/07/20 1330  ciprofloxacin (CIPRO) tablet 500 mg        04/07/20 1236     04/06/20 1800  ceFEPIme (MAXIPIME) 2 g in sodium chloride 0.9 % 100 mL IVPB  Status:  Discontinued        04/06/20 1408 04/07/20 1236   04/05/20 1500  ceFEPIme (MAXIPIME) 1 g in sodium chloride 0.9 % 100 mL IVPB  Status:  Discontinued        04/05/20 1416 04/06/20 1408       Devices  LINES / TUBES:      Continuous Infusions:   Objective: Vitals:   04/07/20 1553 04/07/20 2313 04/08/20 0413 04/08/20 1436  BP: 106/69 113/67 122/69 101/67  Pulse: 74 77 81 74  Resp: 16 16 14 17   Temp: 97.7 F (36.5 C) 99.1 F (37.3 C) 99.4 F (37.4 C) 98.2 F (36.8 C)  TempSrc: Oral Oral Oral   SpO2: 97% 96% 95% 94%  Weight:      Height:        Intake/Output Summary (Last 24 hours) at 04/08/2020 1723 Last data filed at 04/08/2020 0417 Gross per 24 hour  Intake --  Output 650 ml  Net -650 ml   Filed Weights   04/04/20 1300  Weight: 65 kg    Examination:  General:  A/O x4, No acute respiratory distress Eyes: negative scleral hemorrhage, negative anisocoria, negative icterus ENT: Negative Runny nose, negative gingival bleeding, Neck:  Negative scars, masses, torticollis, lymphadenopathy, JVD Lungs: Clear to auscultation bilaterally without wheezes or crackles Cardiovascular: Regular rate and rhythm without murmur gallop or rub normal S1 and S2 Abdomen: Positive abdominal pain RLQ >>RUQ, nondistended, positive soft, bowel sounds, no rebound, no ascites, no appreciable mass Extremities: No significant cyanosis, clubbing, or edema bilateral lower extremities Skin: Negative rashes, lesions, ulcers Psychiatric: Positive depression, positive EXTREME anxiety, negative fatigue, negative mania  Central nervous system:  Cranial nerves II through XII intact, tongue/uvula midline, all extremities muscle strength 5/5, sensation intact throughout, negative dysarthria, negative expressive aphasia, negative receptive aphasia.  .     Data Reviewed: Care during the described time interval was provided by me .  I have reviewed this patient's available data, including medical history, events of note, physical examination, and all test results as part of my evaluation.  CBC: Recent Labs  Lab 04/04/20 0446 04/05/20 0532 04/06/20 0622 04/07/20 0502 04/08/20 1404  WBC 3.8* 4.3 3.5* 2.6* 3.0*  NEUTROABS 3.3 3.5 2.7 1.9 2.4  HGB 10.1* 9.9* 10.4* 10.3* 10.6*  HCT 31.4* 30.6* 32.8* 32.3* 33.9*  MCV 85.3 86.0 86.8 86.4 88.5  PLT 170 166 156 121* 95*   Basic Metabolic Panel: Recent Labs  Lab 04/04/20 0446 04/05/20 0532 04/06/20 0622 04/07/20 0502 04/08/20 1404  NA 137 138 138 139 137  K 3.7 3.6 4.1 3.3* 4.0  CL 106 106 104 106 104  CO2 23 23 26 25 23   GLUCOSE 193* 158* 189* 95 94  BUN 14 24* 26* 27* 22  CREATININE 0.63 0.68 0.77 0.78 0.66  CALCIUM 8.7* 8.8* 8.8* 8.5* 8.3*  MG 2.4 2.5* 2.7* 2.4 2.2  PHOS  --   --   --   --  3.3   GFR: Estimated Creatinine  Clearance: 69.1 mL/min (by C-G formula based on SCr of 0.66 mg/dL). Liver Function Tests: Recent Labs  Lab 04/08/20 1404  AST 23  ALT 29  ALKPHOS 40  BILITOT 0.8  PROT 5.5*  ALBUMIN 2.8*   No results for input(s): LIPASE, AMYLASE in the last 168 hours. No results for input(s): AMMONIA in the last 168 hours. Coagulation Profile: No results for input(s): INR, PROTIME in the last 168 hours. Cardiac Enzymes: No results for input(s): CKTOTAL, CKMB, CKMBINDEX, TROPONINI in the last 168 hours. BNP (last 3 results) No results for input(s): PROBNP in the last 8760 hours. HbA1C: No results for input(s): HGBA1C in the last 72 hours. CBG: Recent Labs  Lab 04/07/20 2338 04/08/20 0409 04/08/20 0803 04/08/20 1210 04/08/20 1642  GLUCAP 102* 99 117* 96  92   Lipid Profile: No results for input(s): CHOL, HDL, LDLCALC, TRIG, CHOLHDL, LDLDIRECT in the last 72 hours. Thyroid Function Tests: No results for input(s): TSH, T4TOTAL, FREET4, T3FREE, THYROIDAB in the last 72 hours. Anemia Panel: No results for input(s): VITAMINB12, FOLATE, FERRITIN, TIBC, IRON, RETICCTPCT in the last 72 hours. Sepsis Labs: No results for input(s): PROCALCITON, LATICACIDVEN in the last 168 hours.  Recent Results (from the past 240 hour(s))  Respiratory Panel by RT PCR (Flu A&B, Covid) - Nasopharyngeal Swab     Status: None   Collection Time: 04/02/20  4:51 PM   Specimen: Nasopharyngeal Swab  Result Value Ref Range Status   SARS Coronavirus 2 by RT PCR NEGATIVE NEGATIVE Final    Comment: (NOTE) SARS-CoV-2 target nucleic acids are NOT DETECTED.  The SARS-CoV-2 RNA is generally detectable in upper respiratoy specimens during the acute phase of infection. The lowest concentration of SARS-CoV-2 viral copies this assay can detect is 131 copies/mL. A negative result does not preclude SARS-Cov-2 infection and should not be used as the sole basis for treatment or other patient management decisions. A negative result  may occur with  improper specimen collection/handling, submission of specimen other than nasopharyngeal swab, presence of viral mutation(s) within the areas targeted by this assay, and inadequate number of viral copies (<131 copies/mL). A negative result must be combined with clinical observations, patient history, and epidemiological information. The expected result is Negative.  Fact Sheet for Patients:  PinkCheek.be  Fact Sheet for Healthcare Providers:  GravelBags.it  This test is no t yet approved or cleared by the [place] FDA and  has been authorized for detection and/or diagnosis of SARS-CoV-2 by FDA under an Emergency Use Authorization (EUA). This EUA will remain  in effect (meaning this test can be used) for the duration of the COVID-19 declaration under Section 564(b)(1) of the Act, 21 U.S.C. section 360bbb-3(b)(1), unless the authorization is terminated or revoked sooner.     Influenza A by PCR NEGATIVE NEGATIVE Final   Influenza B by PCR NEGATIVE NEGATIVE Final    Comment: (NOTE) The Xpert Xpress SARS-CoV-2/FLU/RSV assay is intended as an aid in  the diagnosis of influenza from Nasopharyngeal swab specimens and  should not be used as a sole basis for treatment. Nasal washings and  aspirates are unacceptable for Xpert Xpress SARS-CoV-2/FLU/RSV  testing.  Fact Sheet for Patients: PinkCheek.be  Fact Sheet for Healthcare Providers: GravelBags.it  This test is not yet approved or cleared by the [place] FDA and  has been authorized for detection and/or diagnosis of SARS-CoV-2 by  FDA under an Emergency Use Authorization (EUA). This EUA will remain  in effect (meaning this test can be used) for the duration of the  Covid-19 declaration under Section 564(b)(1) of the Act, 21  U.S.C. section 360bbb-3(b)(1), unless the authorization is  terminated  or revoked. Performed at [facility], [place] [address] [place], [place] [address]   Urine culture     Status: Abnormal   Collection Time: 04/02/20  6:58 PM   Specimen: Urine, Random  Result Value Ref Range Status   Specimen Description   Final    URINE, RANDOM Performed at [place] [address], [place], [place] [address]    Special Requests   Final    NONE Performed at [facility], [place] [address], [place], [place] [address]    Culture 20,000 COLONIES/mL PSEUDOMONAS AERUGINOSA (A)  Final   Report Status 04/05/2020 FINAL  Final   Organism ID, Bacteria PSEUDOMONAS AERUGINOSA (A)  Final      Susceptibility   Pseudomonas aeruginosa - MIC*    CEFTAZIDIME <=1 SENSITIVE Sensitive     CIPROFLOXACIN 2 INTERMEDIATE Intermediate     GENTAMICIN <=1 SENSITIVE Sensitive     IMIPENEM >=16 RESISTANT Resistant     PIP/TAZO <=4 SENSITIVE Sensitive     CEFEPIME 0.5 SENSITIVE Sensitive     * 20,000 COLONIES/mL PSEUDOMONAS AERUGINOSA         Radiology Studies: No results found.      Scheduled Meds: . ciprofloxacin  500 mg Oral BID  . clorazepate  7.5 mg Oral BID  . FLUoxetine  20 mg Oral q morning - 10a  . insulin aspart  0-20 Units Subcutaneous TID AC & HS  . propranolol  10 mg Oral BID  . rosuvastatin  5 mg Oral Daily  . sodium chloride flush  3 mL Intravenous Q12H  . tamsulosin  0.4 mg Oral Daily   Continuous Infusions:   LOS: 6 days    Time spent:40 min    Jahmere Bramel, Geraldo Docker, MD [facility] Pager [phone]  If 7PM-7AM, please contact night-coverage [email or web address] Password [facility] 04/08/2020, 5:23 PM

## 2020-04-09 LAB — GLUCOSE, CAPILLARY
Glucose-Capillary: 109 mg/dL — ABNORMAL HIGH (ref 70–99)
Glucose-Capillary: 150 mg/dL — ABNORMAL HIGH (ref 70–99)
Glucose-Capillary: 86 mg/dL (ref 70–99)
Glucose-Capillary: 86 mg/dL (ref 70–99)
Glucose-Capillary: 92 mg/dL (ref 70–99)

## 2020-04-09 LAB — BASIC METABOLIC PANEL
Anion gap: 7 (ref 5–15)
BUN: 24 mg/dL — ABNORMAL HIGH (ref 8–23)
CO2: 25 mmol/L (ref 22–32)
Calcium: 8.6 mg/dL — ABNORMAL LOW (ref 8.9–10.3)
Chloride: 105 mmol/L (ref 98–111)
Creatinine, Ser: 0.67 mg/dL (ref 0.44–1.00)
GFR, Estimated: >60 mL/min (ref >60)
Glucose, Bld: 108 mg/dL — ABNORMAL HIGH (ref 70–99)
Potassium: 4.1 mmol/L (ref 3.5–5.1)
Sodium: 137 mmol/L (ref 135–145)

## 2020-04-09 LAB — PHOSPHORUS: Phosphorus: 3.7 mg/dL (ref 2.5–4.6)

## 2020-04-09 LAB — LIPID PANEL
Cholesterol: 140 mg/dL (ref 0–200)
HDL: 24 mg/dL — ABNORMAL LOW (ref >40)
LDL Cholesterol: 60 mg/dL (ref 0–99)
Total CHOL/HDL Ratio: 5.8 RATIO
Triglycerides: 278 mg/dL — ABNORMAL HIGH (ref <150)
VLDL: 56 mg/dL — ABNORMAL HIGH (ref 0–40)

## 2020-04-09 LAB — MAGNESIUM: Magnesium: 2.2 mg/dL (ref 1.7–2.4)

## 2020-04-09 MED ORDER — POLYETHYLENE GLYCOL 3350 17 G PO PACK
17.0000 g | PACK | Freq: Every day | ORAL | 0 refills | Status: DC
Start: 2020-04-10 — End: 2021-06-12

## 2020-04-09 MED ORDER — DOCUSATE SODIUM 100 MG PO CAPS
100.0000 mg | ORAL_CAPSULE | Freq: Two times a day (BID) | ORAL | Status: DC
  Administered 2020-04-09: 100 mg via ORAL
  Filled 2020-04-09: qty 1

## 2020-04-09 MED ORDER — MORPHINE SULFATE 15 MG PO TABS
15.0000 mg | ORAL_TABLET | ORAL | Status: DC
  Filled 2020-04-09: qty 1

## 2020-04-09 MED ORDER — TAMSULOSIN HCL 0.4 MG PO CAPS
0.4000 mg | ORAL_CAPSULE | Freq: Every day | ORAL | 0 refills | Status: DC
Start: 2020-04-10 — End: 2020-07-11

## 2020-04-09 MED ORDER — DOCUSATE SODIUM 100 MG PO CAPS: 100.0000 mg | ORAL_CAPSULE | Freq: Two times a day (BID) | ORAL | 0 refills | Status: AC

## 2020-04-09 MED ORDER — CIPROFLOXACIN HCL 500 MG PO TABS: 500.0000 mg | ORAL_TABLET | Freq: Two times a day (BID) | ORAL | 0 refills | Status: AC

## 2020-04-09 NOTE — Progress Notes (Signed)
Reviewed AVS with Patient and Husband.   Answered any questions.  Patient alert and oriented, skin warm and dry with pain of a level 4.  Taken to main lobby and discharged into care of husband without any difficulties.

## 2020-04-09 NOTE — Discharge Summary (Signed)
Physician Discharge Summary  Melinda [name] UTM:[id number] DOB: [birth year] DOA: 04/02/2020  PCP: Crist Infante, MD  Admit date: 04/02/2020 Discharge date: 04/09/2020  Admitted From: Home Disposition:  Home  Recommendations for Outpatient Follow-up:  1. Follow up with PCP in 1-2 weeks 2. Follow up with Urology in 1-2 weeks 3. Follow up with Oncology as scheduled  Discharge Condition:Stable CODE STATUS:Full Diet recommendation: Regular   Brief/Interim Summary: 59 yo WF PMHx recurrent anal SCC (most recently treated with chemoradiation with mitomycin and 5-FU, last treatment 03/07/2020), DMII, HLD   who presents to the ER with recurrent abdominal pain and nausea/vomiting at home. [name]. She was previously treated for radiation induced colitis earlier in October and a UTI. Her most recent hospitalization was from 03/26/2020 to 04/01/2020 for obstructed right ureteral calculus with hydroureteronephrosis. She underwent ureteroscopy with stone removal and laser lithotripsy and cystoscopy. Distal ureteral stent was removed as kidney was noted to be draining well on the right side. During that hospitalization she was treated with IV steroids, fluids, and pain medication. She was not discharged with oral steroids due to anxiety associated with them. She was also noted to have ileitis in the distal ileum.  She again presented to the ER with recurrent abdominal pain. She states that on Tuesday she took some Colace then has 6 very soft/diarrheal bowel movements. She then took some Imodium afterwards. She states that she ate chocolate mousse at night which upset her stomach causing her to vomit. Due to her recurrent abdominal pain she presented to the ER for evaluation once again.  She underwent repeat CT abdomen/pelvis on evaluation in the ER. There was again noted to be distal and terminal ileitis, potentially treatment related. There was moderate  right hydroureteronephrosis to the level of the distal right pelvic ureter "similar to slightly worsened". "Nonspecific urothelial wall thickening and hyperenhancement in the distal 2 to 3 cm of the right pelvic ureter, similar, probably inflammatory."  She was treated with Dilaudid, LR bolus, and Zofran in the ER. She is admitted for further evaluation and repeat consultation with urology.  Her pain seemed to localize to her RLQ as hospitalization continued. She had many reservations about nephrostomy tube placement.  Despite ongoing steroids and conservative measures her pain persisted. She underwent a trial of vaginal valium and toradol on 10/30.   She had a repeat CT performed on 10/31 which showed improvement in ileitis and stable/slight improvement in the right ureter/collecting system. Due to this patient was comfortable with ongoing conservative and monitoring treatment.   She had also been started on cefepime for Pseudomonas UTI treatment given significant symptoms of dysuria when culture began growing. Plan is for 7-day treatment course per urology recommendations as well. She completed approximately 3 days IV cefepime inpatient and due to her allergy profile and resistance pattern noted on culture data (after discussion with ID pharmacist) she was deescalated to ciprofloxacin  Discharge Diagnoses:  Principal Problem:   Abdominal pain Active Problems:   Squamous cell cancer, anus ([place])   Diabetes mellitus ([place])   Ileitis   Hydroureteronephrosis   Recurrent anal squamous cell carcinoma (H[facility])   UTI (urinary tract infection)   Diabetes mellitus type 2, controlled, without complications (H[facility])   Pseudomonas aeruginosa infection   Thrombocytopenia ([facility])  Acute abdominal pain -Multifactorial terminal ileitis, RIGHT ureteral thickening with associated hydronephrosis, pseudomonal UTI. -Patient pain now localized RLQ abdomen. -Repeat CT on 10/31 demonstrated improvement of  ileitis -discontinued IV pain medications  on 11/2 -Resumed home PO morphine  Pseudomonal UTI -Per EMR [facility] physician discussed case with ID pharmacist; s/p 3 days cefepime and cipro concentrates well in urine (patient has multiple ambiguous allergies that she does not remember reactions)...Marland KitchenMarland KitchenPt was started on Ciprofloxacin inpatient on 11/1( 7 day total, to finish on 04/11/20) -Complete 7-day course antibiotics  Hydroureteronephrosis -CTon admissionshows nonspecific urothelial wall thickening and hyperenhancement of the distal 2 to 3 cm of the right pelvic ureter, may be contributing to current pain. -Repeat CT on 10/31 does not show any worsening; patient okay with monitoring further and outpatient follow up -Schedule follow-up with Dr. Louis Meckel urology in 1 to 2 weeks  Recurrent anal squamous cell carcinoma -Patient seen by Dr. Truitt Merle oncology -most recently treated with chemoradiation with mitomycin and 5-FU, last treatment 03/07/2020 -Schedule follow-up Dr. Truitt Merle oncology in 1 week  DM type II controlled without complication -8/29 hemoglobin A1c= 5.7 -Not on any home medication. -While hospitalized resistant SSI  Thrombocytopenia -Most likely secondary to infection and recent chemotherapy.  Discharge Instructions   Allergies as of 04/09/2020      Reactions   Cephalexin Anaphylaxis, Other (See Comments)   Kef tabs only (maybe dye) Tolerated Keflex inpatient in 2016   Amoxapine And Related    Amoxicillin-pot Clavulanate Other (See Comments)   Atorvastatin Other (See Comments)   intolernace   Cefaclor Other (See Comments)   Ciprofloxacin Nausea And Vomiting   Codeine Hives   Doxycycline Calcium Nausea And Vomiting   Fenofibrate Micronized Other (See Comments)   Cramping   Fish Oil Other (See Comments)   Flagyl [metronidazole Hcl]    Levofloxacin In D5w Other (See Comments)   Unknown    Metronidazole Nausea And Vomiting   Ofloxacin Other (See  Comments)   Prednisolone Other (See Comments)   Anxiety, palpitations   Promethazine Hcl Other (See Comments)   Tape Other (See Comments)   Rash and blisters, only plastic tape   Tetracycline Hcl Other (See Comments)   Erythromycin Rash   Nitrofurantoin Itching, Nausea And Vomiting, Rash      Medication List    STOP taking these medications   Roxicodone 5 MG immediate release tablet Generic drug: oxyCODONE     TAKE these medications   ciprofloxacin 500 MG tablet Commonly known as: CIPRO Take 1 tablet (500 mg total) by mouth 2 (two) times daily for 3 days.   clorazepate 7.5 MG tablet Commonly known as: TRANXENE Take 7.5 mg by mouth 2 (two) times daily.   Crestor 5 MG tablet Generic drug: rosuvastatin Take 5 mg by mouth every evening.   docusate sodium 100 MG capsule Commonly known as: COLACE Take 1 capsule (100 mg total) by mouth 2 (two) times daily.   esomeprazole 40 MG capsule Commonly known as: NEXIUM Take 40 mg by mouth daily at 12 noon.   feeding supplement Liqd Take 237 mLs by mouth daily.   FLUoxetine 20 MG capsule Commonly known as: PROZAC Take 20 mg by mouth every morning.   IMODIUM PO Take 1 capsule by mouth every 6 (six) hours as needed (loose stool).   methenamine 1 g tablet Commonly known as: HIPREX Take 1 g by mouth daily.   morphine 15 MG tablet Commonly known as: MSIR Take 0.5-1 tablets (7.5-15 mg total) by mouth every 8 (eight) hours as needed for severe pain. What changed: how much to take   Nu-Iron 150 MG capsule Generic drug: iron polysaccharides Take 150 mg by mouth daily.  ondansetron 8 MG tablet Commonly known as: Zofran Take 1 tablet (8 mg total) by mouth 2 (two) times daily as needed (Nausea or vomiting). What changed: reasons to take this   phenazopyridine 100 MG tablet Commonly known as: Pyridium Take 1 tablet (100 mg total) by mouth 3 (three) times daily as needed for pain.   polyethylene glycol 17 g packet Commonly  known as: MIRALAX / GLYCOLAX Take 17 g by mouth daily. Start taking on: April 10, 2020   propranolol 10 MG tablet Commonly known as: INDERAL TAKE 1 TABLET BY MOUTH TWICE A DAY What changed: when to take this   tamsulosin 0.4 MG Caps capsule Commonly known as: FLOMAX Take 1 capsule (0.4 mg total) by mouth daily. Start taking on: April 10, 2020       Follow-up Information    Crist Infante, MD. Schedule an appointment as soon as possible for a visit in 1 week(s).   Specialty: Internal Medicine Contact information: [place] [address] [phone]        Fay Records, MD .   Specialty: Cardiology Contact information: [place] [address] [address] [phone]        [name], MD. Schedule an appointment as soon as possible for a visit in 1 week(s).   Specialty: Urology Contact information: [place] [address] [phone]        Truitt Merle, MD Follow up.   Specialties: Hematology, Oncology Why: as scheduled Contact information: [address] [place] [address] [phone]              Allergies  Allergen Reactions  . Cephalexin Anaphylaxis and Other (See Comments)    Kef tabs only (maybe dye) Tolerated Keflex inpatient in 2016  . Amoxapine And Related   . Amoxicillin-Pot Clavulanate Other (See Comments)  . Atorvastatin Other (See Comments)    intolernace  . Cefaclor Other (See Comments)  . Ciprofloxacin Nausea And Vomiting  . Codeine Hives  . Doxycycline Calcium Nausea And Vomiting  . Fenofibrate Micronized Other (See Comments)    Cramping  . Fish Oil Other (See Comments)  . Flagyl [Metronidazole Hcl]   . Levofloxacin In D5w Other (See Comments)    Unknown   . Metronidazole Nausea And Vomiting  . Ofloxacin Other (See Comments)  . Prednisolone Other (See Comments)    Anxiety, palpitations  . Promethazine Hcl Other (See Comments)  . Tape Other (See Comments)     Rash and blisters, only plastic tape  . Tetracycline Hcl Other (See Comments)  . Erythromycin Rash  . Nitrofurantoin Itching, Nausea And Vomiting and Rash    Consultations:  Urology  Oncology  Procedures/Studies: CT ABDOMEN PELVIS W CONTRAST  Result Date: 04/06/2020 CLINICAL DATA:  Lower abdominal pain. History of ileitis and kidney stone. Patient has a history of anal cancer status post radiation therapy and chemotherapy. EXAM: CT ABDOMEN AND PELVIS WITH CONTRAST TECHNIQUE: Multidetector CT imaging of the abdomen and pelvis was performed using the standard protocol following bolus administration of intravenous contrast. CONTRAST:  151mL OMNIPAQUE IOHEXOL 300 MG/ML  SOLN COMPARISON:  CT abdomen pelvis dated 04/02/2020. FINDINGS: Lower chest: There are trace bilateral pleural effusions with associated atelectasis. Hepatobiliary: No focal liver abnormality is seen. The gallbladder is decompressed. No gallstones, gallbladder wall thickening, or biliary dilatation. Pancreas: Unremarkable. No pancreatic ductal dilatation or surrounding inflammatory changes. Spleen: Normal in size without focal abnormality. Adrenals/Urinary Tract: Adrenal glands are unremarkable. Moderate  right hydroureteronephrosis is not significantly changed since 03/28/2020, but is increased since 03/26/2020. Enhancement of the urothelial wall has decreased since 03/28/2020, however enhancement is persistent in the distal 2-3 cm of the right ureter as it inserts on the bladder. Chronic right renal cortical scarring is redemonstrated. A 9 mm hypoattenuating mass in the right kidney is not significantly changed since 09/04/2019 and likely represents a cyst, but is too small to characterize. The left kidney appears normal without focal lesion or hydronephrosis. The left ureter appears normal. Stomach/Bowel: Stomach is within normal limits. There is wall thickening and hyperenhancement of the distal ileum and terminal ileum, decreased  since 04/02/2020. Surrounding inflammatory changes and free intraperitoneal fluid have also decreased (now small in volume). The patient is status post appendectomy. No new areas of inflammatory changes involving the bowel. No evidence of bowel obstruction. Vascular/Lymphatic: Aortic atherosclerosis. There is a retroaortic left renal vein. No enlarged abdominal or pelvic lymph nodes. Reproductive: Status post hysterectomy. No adnexal masses. Other: No abdominal wall hernia is identified. Musculoskeletal: Degenerative changes are seen in the spine. IMPRESSION: 1. Decreased wall thickening and hyperenhancement of the distal ileum and terminal ileum with decreased surrounding inflammatory changes and free intraperitoneal fluid. 2. Moderate right hydroureteronephrosis is not significantly changed since 03/28/2020, however enhancement of the urothelial wall has decreased since 03/28/2020. 3. Trace bilateral pleural effusions with associated atelectasis. Aortic Atherosclerosis (ICD10-I70.0). Electronically Signed   By: Zerita Boers M.D.   On: 04/06/2020 15:24   CT ABDOMEN PELVIS W CONTRAST  Result Date: 04/02/2020 CLINICAL DATA:  Lower abdominal pain for almost 2 months with worsening since last night. Nausea, vomiting and diarrhea. 25 pound weight loss. History of anal cancer status post radiation therapy in February 2021 with completion of chemotherapy 03/07/2020. EXAM: CT ABDOMEN AND PELVIS WITH CONTRAST TECHNIQUE: Multidetector CT imaging of the abdomen and pelvis was performed using the standard protocol following bolus administration of intravenous contrast. CONTRAST:  168mL OMNIPAQUE IOHEXOL 300 MG/ML  SOLN COMPARISON:  03/28/2020 CT abdomen/pelvis. FINDINGS: Lower chest: Posterior right lower lobe 3 mm solid pulmonary nodule (series 6/image 11), unchanged from 03/28/2020 CT abdomen study. Hepatobiliary: Normal liver size. No liver mass. Normal gallbladder with no radiopaque cholelithiasis. No biliary ductal  dilatation. Pancreas: Normal, with no mass or duct dilation. Spleen: Normal size. No mass. Adrenals/Urinary Tract: Normal adrenals. Moderate right hydroureteronephrosis to the level of distal right pelvic ureter, similar to slightly worsened since 03/28/2020 CT. Urothelial wall thickening and hyperenhancement in the distal 2-3 cm of the right pelvic ureter, similar. No left hydronephrosis. Normal caliber left ureter. Moderate renal cortical scarring throughout the upper right kidney is unchanged. Subcentimeter hypodense renal cortical lesion in the anterior interpolar right kidney is too small to characterize and unchanged. No new renal lesions. Tiny focus of gas in the nondependent bladder. No definite bladder wall thickening. Small calcification in the anterior midline bladder wall is unchanged. Stomach/Bowel: Small hiatal hernia. Otherwise normal nondistended stomach. Long segment of moderate contiguous wall thickening in the distal and terminal ileum with associated mucosal hyperenhancement and surrounding mesenteric fat stranding and ill-defined fluid, similar to slightly improved in the interval. No new sites of small bowel wall thickening. No small bowel dilatation. Appendectomy. No definite large bowel wall thickening. No colonic diverticulosis. No acute pericolonic fat stranding. Large bowel is largely collapsed. Vascular/Lymphatic: Atherosclerotic nonaneurysmal abdominal aorta. Patent portal, splenic, hepatic and renal veins. Retroaortic left renal vein. No pathologically enlarged lymph nodes in the abdomen or pelvis. Reproductive: Status post hysterectomy,  with no mass at the vaginal cuff. No adnexal mass. Other: No pneumoperitoneum. No focal fluid collections. Small volume free fluid in the pelvic cul-de-sac is similar. Musculoskeletal: No aggressive appearing focal osseous lesions. Moderate thoracolumbar spondylosis. IMPRESSION: 1. CT study is fairly similar to the CT study performed 5 days prior. 2.  Nonspecific distal and terminal ileitis, potentially treatment related, similar to slightly improved. No bowel obstruction. No free air. 3. Moderate right hydroureteronephrosis to the level of the distal right pelvic ureter, similar to slightly worsened. Nonspecific urothelial wall thickening and hyperenhancement in the distal 2-3 cm of the right pelvic ureter, similar, probably inflammatory. Consider urology consultation. 4. Tiny focus of gas in the nondependent bladder, presumably due to recent bladder instrumentation. Correlate with urinalysis as clinically warranted to exclude acute cystitis. 5. Stable small volume free fluid in the pelvic cul-de-sac. 6. No lymphadenopathy or other findings suspicious for metastatic disease in the abdomen or pelvis. Right lung base 3 mm solid pulmonary nodule is stable and warrants continued chest CT follow-up. 7. Small hiatal hernia. 8. Aortic Atherosclerosis (ICD10-I70.0). Electronically Signed   By: Ilona Sorrel M.D.   On: 04/02/2020 17:21   CT ABDOMEN PELVIS W CONTRAST  Result Date: 03/28/2020 CLINICAL DATA:  Acute nonlocalized abdominal pain EXAM: CT ABDOMEN AND PELVIS WITH CONTRAST TECHNIQUE: Multidetector CT imaging of the abdomen and pelvis was performed using the standard protocol following bolus administration of intravenous contrast. CONTRAST:  144mL OMNIPAQUE IOHEXOL 300 MG/ML  SOLN COMPARISON:  Two days ago FINDINGS: Lower chest:  Trace pericardial fluid, stable. Hepatobiliary: No focal liver abnormality.No evidence of biliary obstruction or stone. Pancreas: Unremarkable. Spleen: Unremarkable. Adrenals/Urinary Tract: Negative adrenals. Progressive right hydroureteronephrosis despite passage of a stone there is ureteral thickening at the UVJ. No left hydronephrosis. Right renal cortical scarring affecting the upper pole. Punctate upper pole calculus on the right. Gas in the urinary bladder. Stomach/Bowel: Distal ileitis with submucosal low-density edema that is  stable if not progressed. Small volume ascites is likely related. Appendectomy. History of anal cancer. No visible in a rectal mass. Vascular/Lymphatic: No acute vascular abnormality. Diffuse atheromatous plaque involving the aorta and iliacs. No mass or adenopathy. Reproductive:Hysterectomy. Other: Small volume reactive appearing ascites in the pelvis Musculoskeletal: No acute abnormalities. IMPRESSION: 1. Passed right ureteral calculus but progressive right hydroureteronephrosis in the setting of lower right ureteral thickening. 2. Ongoing distal ileitis which could be infectious, inflammatory, or radiation related. 3. Cortical scarring and small calculus of the right kidney. Electronically Signed   By: Monte Fantasia M.D.   On: 03/28/2020 07:14   CT ABDOMEN PELVIS W CONTRAST  Result Date: 03/26/2020 CLINICAL DATA:  Increasing abdominal pain since recent discharge EXAM: CT ABDOMEN AND PELVIS WITH CONTRAST TECHNIQUE: Multidetector CT imaging of the abdomen and pelvis was performed using the standard protocol following bolus administration of intravenous contrast. CONTRAST:  124mL OMNIPAQUE IOHEXOL 300 MG/ML  SOLN COMPARISON:  03/13/2020 FINDINGS: Lower chest: No acute abnormality. Hepatobiliary: Probable hepatic steatosis. No focal liver lesion. Gallbladder is unremarkable. No biliary dilatation. Pancreas: Unremarkable. Spleen: Unremarkable. Adrenals/Urinary Tract: Adrenals unremarkable. Punctate nonobstructing calculus of the upper pole the right kidney. There is right upper pole cortical scarring. Mild right hydroureteronephrosis. This is secondary to an obstructing 3 mm calculus of the distal ureter, which was present on the prior study and has moved slightly inferiorly. Bladder is unremarkable. Stomach/Bowel: Stomach is within normal limits. There is persistent wall thickening involving the terminal ileum and adjacent distal ileum. Decreased cecal wall thickening. No  bowel obstruction. Vascular/Lymphatic:  Aortic atherosclerosis. No enlarged lymph nodes identified. Reproductive: Status post hysterectomy. No adnexal masses. Other: Small volume abdominopelvic free fluid. Abdominal wall is unremarkable. Musculoskeletal: No acute osseous abnormality. IMPRESSION: New mild right hydroureteronephrosis. Secondary to an obstructing 3 mm distal right ureteral calculus. This was present on the prior study and has moved slightly inferiorly/distally. Residual or recurrent infectious/inflammatory changes of the distal ileum. Decreased cecal wall thickening. Electronically Signed   By: Macy Mis M.D.   On: 03/26/2020 09:39   CT ABDOMEN PELVIS W CONTRAST  Result Date: 03/13/2020 CLINICAL DATA:  Abdominal pain and fever, history of squamous cell EXAM: CT ABDOMEN AND PELVIS WITH CONTRAST TECHNIQUE: Multidetector CT imaging of the abdomen and pelvis was performed using the standard protocol following bolus administration of intravenous contrast. CONTRAST:  17mL OMNIPAQUE IOHEXOL 300 MG/ML  SOLN COMPARISON:  September 04, 2019 FINDINGS: Lower chest: The visualized heart size within normal limits. There is a trace pericardial effusion. No hiatal hernia. The visualized portions of the lungs are clear. Hepatobiliary: There is diffuse low density seen throughout the liver parenchyma. No focal hepatic lesion is seen. No intra or extrahepatic biliary ductal dilatation. The main portal vein is patent. No evidence of calcified gallstones, gallbladder wall thickening or biliary dilatation. Pancreas: Unremarkable. No pancreatic ductal dilatation or surrounding inflammatory changes. Spleen: Normal in size without focal abnormality. Adrenals/Urinary Tract: Both adrenal glands appear normal. There is a punctate calcifications seen in the upper pole of the right kidney. There is areas of scarring seen within the upper pole. A 1 cm low-density lesion seen in the lower pole the right kidney. No left-sided renal or collecting system calculi. There  is mild diffuse bladder wall thickening. Stomach/Bowel: The stomach and proximal small bowel are unremarkable. There is a long segment of ileum with diffuse wall thickening surrounding fat stranding changes to the terminal ileum. There is also wall thickening seen around the distal cecal pole. Mild wall thickening with surrounding fat stranding changes are seen around the sigmoid colon extending to the sigmoid rectal junction. No loculated fluid collections or free air however are noted. Vascular/Lymphatic: There are no enlarged mesenteric, retroperitoneal, or pelvic lymph nodes. Scattered aortic atherosclerotic calcifications are seen without aneurysmal dilatation. Reproductive: The patient is status post hysterectomy. No adnexal masses or collections seen. Other: A small amount of free fluid is seen within the deep cul-de-sac. Musculoskeletal: No acute or significant osseous findings. IMPRESSION: Diffuse wall thickening with inflammatory changes involving a long segment of distal ileum, cecum, and sigmoid colon likely represents enteritis/colitis. This could be due to infectious, or inflammatory nature, not thought to represent ischemic colitis. Hepatic steatosis Trace pericardial effusion Small free fluid in the deep pelvis Electronically Signed   By: Prudencio Pair M.D.   On: 03/13/2020 21:30   DG C-Arm 1-60 Min-No Report  Result Date: 03/26/2020 Fluoroscopy was utilized by the requesting physician.  No radiographic interpretation.     Subjective: Complaining of RLQ pain today  Discharge Exam: Vitals:   04/08/20 2117 04/09/20 0417  BP: 96/67 95/60  Pulse: 87 77  Resp: 16 14  Temp: 98.3 F (36.8 C) 98.2 F (36.8 C)  SpO2: 99% 97%   Vitals:   04/08/20 0413 04/08/20 1436 04/08/20 2117 04/09/20 0417  BP: 122/69 101/67 96/67 95/60   Pulse: 81 74 87 77  Resp: 14 17 16 14   Temp: 99.4 F (37.4 C) 98.2 F (36.8 C) 98.3 F (36.8 C) 98.2 F (36.8 C)  TempSrc: Oral  Oral Oral  SpO2: 95% 94%  99% 97%  Weight:      Height:        General: Pt is alert, awake, not in acute distress Cardiovascular: RRR, S1/S2 +, no rubs, no gallops Respiratory: CTA bilaterally, no wheezing, no rhonchi Abdominal: Soft, generalized tenderness, ND, bowel sounds + Extremities: no edema, no cyanosis   The results of significant diagnostics from this hospitalization (including imaging, microbiology, ancillary and laboratory) are listed below for reference.     Microbiology: Recent Results (from the past 240 hour(s))  Respiratory Panel by RT PCR (Flu A&B, Covid) - Nasopharyngeal Swab     Status: None   Collection Time: 04/02/20  4:51 PM   Specimen: Nasopharyngeal Swab  Result Value Ref Range Status   SARS Coronavirus 2 by RT PCR NEGATIVE NEGATIVE Final    Comment: (NOTE) SARS-CoV-2 target nucleic acids are NOT DETECTED.  The SARS-CoV-2 RNA is generally detectable in upper respiratoy specimens during the acute phase of infection. The lowest concentration of SARS-CoV-2 viral copies this assay can detect is 131 copies/mL. A negative result does not preclude SARS-Cov-2 infection and should not be used as the sole basis for treatment or other patient management decisions. A negative result may occur with  improper specimen collection/handling, submission of specimen other than nasopharyngeal swab, presence of viral mutation(s) within the areas targeted by this assay, and inadequate number of viral copies (<131 copies/mL). A negative result must be combined with clinical observations, patient history, and epidemiological information. The expected result is Negative.  Fact Sheet for Patients:  PinkCheek.be  Fact Sheet for Healthcare Providers:  GravelBags.it  This test is no t yet approved or cleared by the [place] FDA and  has been authorized for detection and/or diagnosis of SARS-CoV-2 by FDA under an Emergency Use Authorization  (EUA). This EUA will remain  in effect (meaning this test can be used) for the duration of the COVID-19 declaration under Section 564(b)(1) of the Act, 21 U.S.C. section 360bbb-3(b)(1), unless the authorization is terminated or revoked sooner.     Influenza A by PCR NEGATIVE NEGATIVE Final   Influenza B by PCR NEGATIVE NEGATIVE Final    Comment: (NOTE) The Xpert Xpress SARS-CoV-2/FLU/RSV assay is intended as an aid in  the diagnosis of influenza from Nasopharyngeal swab specimens and  should not be used as a sole basis for treatment. Nasal washings and  aspirates are unacceptable for Xpert Xpress SARS-CoV-2/FLU/RSV  testing.  Fact Sheet for Patients: PinkCheek.be  Fact Sheet for Healthcare Providers: GravelBags.it  This test is not yet approved or cleared by the [place] FDA and  has been authorized for detection and/or diagnosis of SARS-CoV-2 by  FDA under an Emergency Use Authorization (EUA). This EUA will remain  in effect (meaning this test can be used) for the duration of the  Covid-19 declaration under Section 564(b)(1) of the Act, 21  U.S.C. section 360bbb-3(b)(1), unless the authorization is  terminated or revoked. Performed at [facility], [place] [address], [place], [place] [address]   Urine culture     Status: Abnormal   Collection Time: 04/02/20  6:58 PM   Specimen: Urine, Random  Result Value Ref Range Status   Specimen Description   Final    URINE, RANDOM Performed at [place] [address] [place], [place] [address]    Special Requests   Final    NONE Performed at [facility], [address] [place], [place]  [address]    Culture 20,000 COLONIES/mL PSEUDOMONAS AERUGINOSA (A)  Final   Report Status 04/05/2020 FINAL  Final   Organism ID, Bacteria PSEUDOMONAS AERUGINOSA (A)  Final      Susceptibility   Pseudomonas aeruginosa - MIC*     CEFTAZIDIME <=1 SENSITIVE Sensitive     CIPROFLOXACIN 2 INTERMEDIATE Intermediate     GENTAMICIN <=1 SENSITIVE Sensitive     IMIPENEM >=16 RESISTANT Resistant     PIP/TAZO <=4 SENSITIVE Sensitive     CEFEPIME 0.5 SENSITIVE Sensitive     * 20,000 COLONIES/mL PSEUDOMONAS AERUGINOSA     Labs: BNP (last 3 results) No results for input(s): BNP in the last 8760 hours. Basic Metabolic Panel: Recent Labs  Lab 04/05/20 0532 04/06/20 0622 04/07/20 0502 04/08/20 1404 04/09/20 0535  NA 138 138 139 137 137  K 3.6 4.1 3.3* 4.0 4.1  CL 106 104 106 104 105  CO2 23 26 25 23 25   GLUCOSE 158* 189* 95 94 108*  BUN 24* 26* 27* 22 24*  CREATININE 0.68 0.77 0.78 0.66 0.67  CALCIUM 8.8* 8.8* 8.5* 8.3* 8.6*  MG 2.5* 2.7* 2.4 2.2 2.2  PHOS  --   --   --  3.3 3.7   Liver Function Tests: Recent Labs  Lab 04/08/20 1404  AST 23  ALT 29  ALKPHOS 40  BILITOT 0.8  PROT 5.5*  ALBUMIN 2.8*   No results for input(s): LIPASE, AMYLASE in the last 168 hours. No results for input(s): AMMONIA in the last 168 hours. CBC: Recent Labs  Lab 04/04/20 0446 04/05/20 0532 04/06/20 0622 04/07/20 0502 04/08/20 1404  WBC 3.8* 4.3 3.5* 2.6* 3.0*  NEUTROABS 3.3 3.5 2.7 1.9 2.4  HGB 10.1* 9.9* 10.4* 10.3* 10.6*  HCT 31.4* 30.6* 32.8* 32.3* 33.9*  MCV 85.3 86.0 86.8 86.4 88.5  PLT 170 166 156 121* 95*   Cardiac Enzymes: No results for input(s): CKTOTAL, CKMB, CKMBINDEX, TROPONINI in the last 168 hours. BNP: Invalid input(s): POCBNP CBG: Recent Labs  Lab 04/08/20 2115 04/09/20 0008 04/09/20 0415 04/09/20 0806 04/09/20 1222  GLUCAP 165* 86 92 86 150*   D-Dimer No results for input(s): DDIMER in the last 72 hours. Hgb A1c No results for input(s): HGBA1C in the last 72 hours. Lipid Profile Recent Labs    04/09/20 0535  CHOL 140  HDL 24*  LDLCALC 60  TRIG 278*  CHOLHDL 5.8   Thyroid function studies No results for input(s): TSH, T4TOTAL, T3FREE, THYROIDAB in the last 72 hours.  Invalid  input(s): FREET3 Anemia work up No results for input(s): VITAMINB12, FOLATE, FERRITIN, TIBC, IRON, RETICCTPCT in the last 72 hours. Urinalysis    Component Value Date/Time   COLORURINE YELLOW 04/02/2020 1858   APPEARANCEUR CLEAR 04/02/2020 1858   LABSPEC 1.044 (H) 04/02/2020 1858   PHURINE 6.0 04/02/2020 1858   GLUCOSEU NEGATIVE 04/02/2020 1858   GLUCOSEU NEGATIVE 01/16/2013 0903   HGBUR MODERATE (A) 04/02/2020 1858   BILIRUBINUR NEGATIVE 04/02/2020 1858   KETONESUR NEGATIVE 04/02/2020 1858   PROTEINUR NEGATIVE 04/02/2020 1858   UROBILINOGEN 0.2 01/16/2013 0903   NITRITE NEGATIVE 04/02/2020 1858   LEUKOCYTESUR NEGATIVE 04/02/2020 1858   Sepsis Labs Invalid input(s): PROCALCITONIN,  WBC,  LACTICIDVEN Microbiology Recent Results (from the past 240 hour(s))  Respiratory Panel by RT PCR (Flu A&B, Covid) - Nasopharyngeal Swab     Status: None   Collection Time: 04/02/20  4:51 PM   Specimen: Nasopharyngeal Swab  Result Value Ref Range Status   SARS Coronavirus  2 by RT PCR NEGATIVE NEGATIVE Final    Comment: (NOTE) SARS-CoV-2 target nucleic acids are NOT DETECTED.  The SARS-CoV-2 RNA is generally detectable in upper respiratoy specimens during the acute phase of infection. The lowest concentration of SARS-CoV-2 viral copies this assay can detect is 131 copies/mL. A negative result does not preclude SARS-Cov-2 infection and should not be used as the sole basis for treatment or other patient management decisions. A negative result may occur with  improper specimen collection/handling, submission of specimen other than nasopharyngeal swab, presence of viral mutation(s) within the areas targeted by this assay, and inadequate number of viral copies (<131 copies/mL). A negative result must be combined with clinical observations, patient history, and epidemiological information. The expected result is Negative.  Fact Sheet for Patients:   PinkCheek.be  Fact Sheet for Healthcare Providers:  GravelBags.it  This test is no t yet approved or cleared by the [place] FDA and  has been authorized for detection and/or diagnosis of SARS-CoV-2 by FDA under an Emergency Use Authorization (EUA). This EUA will remain  in effect (meaning this test can be used) for the duration of the COVID-19 declaration under Section 564(b)(1) of the Act, 21 U.S.C. section 360bbb-3(b)(1), unless the authorization is terminated or revoked sooner.     Influenza A by PCR NEGATIVE NEGATIVE Final   Influenza B by PCR NEGATIVE NEGATIVE Final    Comment: (NOTE) The Xpert Xpress SARS-CoV-2/FLU/RSV assay is intended as an aid in  the diagnosis of influenza from Nasopharyngeal swab specimens and  should not be used as a sole basis for treatment. Nasal washings and  aspirates are unacceptable for Xpert Xpress SARS-CoV-2/FLU/RSV  testing.  Fact Sheet for Patients: PinkCheek.be  Fact Sheet for Healthcare Providers: GravelBags.it  This test is not yet approved or cleared by the [place] FDA and  has been authorized for detection and/or diagnosis of SARS-CoV-2 by  FDA under an Emergency Use Authorization (EUA). This EUA will remain  in effect (meaning this test can be used) for the duration of the  Covid-19 declaration under Section 564(b)(1) of the Act, 21  U.S.C. section 360bbb-3(b)(1), unless the authorization is  terminated or revoked. Performed at [facility], [place] [address] [place], [place] [address]   Urine culture     Status: Abnormal   Collection Time: 04/02/20  6:58 PM   Specimen: Urine, Random  Result Value Ref Range Status   Specimen Description   Final    URINE, RANDOM Performed at [facility] [address], [place], [place] [address]    Special Requests   Final     NONE Performed at [facility] [address], [place] [address] [place], [place] [address]    Culture 20,000 COLONIES/mL PSEUDOMONAS AERUGINOSA (A)  Final   Report Status 04/05/2020 FINAL  Final   Organism ID, Bacteria PSEUDOMONAS AERUGINOSA (A)  Final      Susceptibility   Pseudomonas aeruginosa - MIC*    CEFTAZIDIME <=1 SENSITIVE Sensitive     CIPROFLOXACIN 2 INTERMEDIATE Intermediate     GENTAMICIN <=1 SENSITIVE Sensitive     IMIPENEM >=16 RESISTANT Resistant     PIP/TAZO <=4 SENSITIVE Sensitive     CEFEPIME 0.5 SENSITIVE Sensitive     * 20,000 COLONIES/mL PSEUDOMONAS AERUGINOSA   Time spent: 30 min  SIGNED:   Marylu Lund, MD  [facility] 04/09/2020, 2:28 PM  If 7PM-7AM, please contact night-coverage

## 2020-04-11 ENCOUNTER — Telehealth: Payer: Self-pay | Admitting: Hematology

## 2020-04-11 NOTE — Telephone Encounter (Signed)
Scheduled appt per 11/4 sch msg - unable to reach pt . Left message for patient with appt date and time

## 2020-04-15 ENCOUNTER — Telehealth: Payer: Self-pay | Admitting: Radiation Oncology

## 2020-04-15 DIAGNOSIS — C210 Malignant neoplasm of anus, unspecified: Secondary | ICD-10-CM

## 2020-04-15 NOTE — Telephone Encounter (Signed)
  Radiation Oncology         [phone] ________________________________  Name: Melinda [name] MRN: [id number]  Date of Service: 04/21/20  DOB: Aug 23, 1960  Post Treatment Telephone Note  Diagnosis:   Squamous Cell Carcinoma of the Anus  Interval Since Last Radiation: 7 weeks   02/04/20-03/07/20: The patient was treated to 43.2 Gy over 24 fraction out of the intended 50.4 Gy in 28 fractions due to skin and GI toxicity.  Narrative:  The patient was contacted today for routine follow-up. During treatment she did very well with radiotherapy and did not have significant desquamation. She reports she is slowly improving. She states her skin is healing nicely, but she is still battling pelvic and abdominal pain from time to time and feels like her rectum is falling, and that she's developed more hemorroids. She had several hospitalizations and was diagnosed with ileitis and a kidney stone complicated by colitis and UTI. She is scheduled for follow up in January with Dr. Burr Medico and will get a PET scan set up before the end of the year, and will see Dr. Dema Severin in a few weeks. She has not been evaluated by PT yet.  Impression/Plan: 1. Squamous Cell Carcinoma of the Anus. The patient has been doing well since completion of radiotherapy. We discussed that we would be happy to continue to follow her as needed, but she will also continue to follow up with Dr. Burr Medico in medical oncology. She will be referred to pelvic floor PT and we reviewed the rationale for vaginal dilators and the use of these. These will be available for her to pick up at the front desk at her convenience. She will also see Dr. Dema Severin in colorectal surgery on 05/18/20 to begin surveillance with anoscopy.     Carola Rhine, PAC

## 2020-04-16 DIAGNOSIS — N200 Calculus of kidney: Secondary | ICD-10-CM | POA: Diagnosis not present

## 2020-04-16 DIAGNOSIS — R109 Unspecified abdominal pain: Secondary | ICD-10-CM | POA: Diagnosis not present

## 2020-04-16 DIAGNOSIS — N3000 Acute cystitis without hematuria: Secondary | ICD-10-CM | POA: Diagnosis not present

## 2020-04-16 DIAGNOSIS — N390 Urinary tract infection, site not specified: Secondary | ICD-10-CM | POA: Diagnosis not present

## 2020-04-16 DIAGNOSIS — N130 Hydronephrosis with ureteropelvic junction obstruction: Secondary | ICD-10-CM | POA: Diagnosis not present

## 2020-04-16 DIAGNOSIS — B958 Unspecified staphylococcus as the cause of diseases classified elsewhere: Secondary | ICD-10-CM | POA: Diagnosis not present

## 2020-04-16 NOTE — Progress Notes (Signed)
[place]   [phone] [phone]   Clinic Follow up Note   Patient Care Team: Crist Infante, MD as PCP - General (Internal Medicine) Fay Records, MD as PCP - Cardiology (Cardiology) Nobie Putnam, MD (Hematology and Oncology) Heilingoetter, Tobe Sos, PA-C as Physician Assistant (Oncology) Truitt Merle, MD as Consulting Physician (Oncology) Jonnie Finner, RN as Oncology Nurse Navigator  Date of Service:  04/17/2020  CHIEF COMPLAINT: F/u ofSquamous cell carcinoma of the anus  SUMMARY OF ONCOLOGIC HISTORY: Oncology History Overview Note  Cancer Staging No matching staging information was found for the patient.    Squamous cell cancer, anus ([place])  07/13/2019 Procedure    EUA, Excision of posterior internal/external hemorrhoid under the care of Dr. Hassell Done    07/13/2019 Pathology Results   - Invasive moderately differentiated squamous cell carcinoma, 1.4 cm.  See comment  - Carcinoma invades for depth of 0.4 cm  - Deep resection margin is negative for carcinoma (0.2 cm)  - Lateral mucosal margin is positive for high-grade dysplasia  - No evidence of lymphovascular perineural invasion  Procedure: Local excision  Tumor Site: Anal canal  Tumor Size: 1.4 cm  Histologic Type: Invasive squamous cell carcinoma  Histologic Grade: G2: Moderately differentiated  Tumor Extension: Carcinoma invades superficial anal sphincter muscle  Margins: Uninvolved by tumor  Treatment Effect: N/A  Regional Lymph Nodes: No lymph nodes submitted or found  Pathologic Stage Classification (pTNM, AJCC 8th Edition):  pT1, pNx  Representative Tumor Block: A1  Comment(s): Lateral mucosal margin is positive for high-grade squamous  dysplasia      07/13/2019 Initial Diagnosis   Squamous cell cancer, anus ([place])   09/04/2019 Imaging   CT scan Chest, Abdomen, and Pelvis   IMPRESSION: 1. No evidence of metastatic disease in the chest, abdomen or pelvis. 2. No  discrete anorectal mass. 3. Chronic findings include: Punctate nonobstructing upper right renal stones and chronic right renal scarring. 4. Aortic Atherosclerosis (ICD10-I70.0).   01/10/2020 Pathology Results   A. ANAL LESION, POSTERIOR MIDLINE, EXCISION:  - Squamous cell carcinoma, moderately differentiated.  Verbally reported by Dr. Saralyn Pilar 1.5 cm, negative margins, depth <1 mm   01/11/2020 Procedure   1. Excision of anal canal lesion (posterior midline) under the care of Dr. Dema Severin     01/28/2020 PET scan   IMPRESSION: 1. Mild focal anal hypermetabolism without discrete mass correlate on the CT images, nonspecific, differential includes postsurgical change or residual anal tumor. 2. No hypermetabolic locoregional or distant metastatic disease. 3. Chronic findings include: Aortic Atherosclerosis (ICD10-I70.0). Diffuse hepatic steatosis. Nonobstructing right nephrolithiasis.     02/04/2020 - 03/07/2020 Radiation Therapy   concurrent chemoRT by Dr Lisbeth Renshaw with Mitomycin and 5FU starting 02/04/20-03/07/20. The last 4 session cancelled due to poor toleration.    02/04/2020 - 03/03/2020 Chemotherapy   Concurrent chemoRT with Mitomycin and 5FU on week 1 and 5 starting 02/04/20-03/03/20   03/13/2020 Imaging   CT AP W contrast  IMPRESSION: Diffuse wall thickening with inflammatory changes involving a long segment of distal ileum, cecum, and sigmoid colon likely represents enteritis/colitis. This could be due to infectious, or inflammatory nature, not thought to represent ischemic colitis.   Hepatic steatosis   Trace pericardial effusion   Small free fluid in the deep pelvis      CURRENT THERAPY:  surveillance   INTERVAL HISTORY:  Melinda [name] is here for a follow up.  Patient was recently hospitalized again from October 27 to April 09, 2020,  for worsening abdominal pain.  She was managed conservatively, and finally discharged home.  She has been recovering slowly at home. She  still very fatigue, appetite better but still low overall  Abdominal pain fluctuates, mainly in low abdomen, she has taken morphine 6 pills since hospital discharge She has not had much bowel movement, every few days, small BM this morning, no bleeding, no diarrhea. Overall energy level improved, she was able to walk into my office today. She saw urology AP yesterday, urine was checked.  All other systems were reviewed with the patient and are negative.  MEDICAL HISTORY:  Past Medical History:  Diagnosis Date  . anal ca dx'd 07/2019  . Diabetes mellitus type 2, controlled ([place])   . Dyslipidemia    untreated  . GERD (gastroesophageal reflux disease)   . Hemorrhoids   . Microcytic anemia 06/22/2012  . Nephrolith   . Palpitation    positive tilt table test. Prozac and Inderal treatment effective.    SURGICAL HISTORY: Past Surgical History:  Procedure Laterality Date  . APPENDECTOMY    . BREAST REDUCTION SURGERY    . CARDIAC CATHETERIZATION  2002   normal LV function and no significant coronary obstruction  . CATARACT EXTRACTION, BILATERAL Bilateral november 2020 and dcember 2020  . CYST EXCISION N/A 01/11/2020   Procedure: EXCISION OF ANAL CANAL MASS;  Surgeon: Ileana Roup, MD;  Location: [facility];  Service: General;  Laterality: N/A;  . CYSTOSCOPY/URETEROSCOPY/HOLMIUM LASER/STENT PLACEMENT Right 03/26/2020   Procedure: CYSTOSCOPY/URETEROSCOPY/HOLMIUM LASER/STENT PLACEMENT;  Surgeon: [name], MD;  Location: [facility];  Service: Urology;  Laterality: Right;  . ESOPHAGEAL MANOMETRY N/A 04/04/2017   Procedure: ESOPHAGEAL MANOMETRY (EM);  Surgeon: Laurence Spates, MD;  Location: [facility];  Service: Endoscopy;  Laterality: N/A;  . HEMORRHOID SURGERY N/A 07/13/2019   Procedure: HEMORRHOIDECTOMY;  Surgeon: Johnathan Hausen, MD;  Location: [facility];  Service: General;  Laterality: N/A;  . KIDNEY SURGERY     kidney stones   . PARTIAL HYSTERECTOMY    . RECTAL  EXAM UNDER ANESTHESIA N/A 01/11/2020   Procedure: RECTAL EXAM UNDER ANESTHESIA;  Surgeon: Ileana Roup, MD;  Location: [facility];  Service: General;  Laterality: N/A;  . TONSILLECTOMY      I have reviewed the social history and family history with the patient and they are unchanged from previous note.  ALLERGIES:  is allergic to cephalexin, amoxapine and related, amoxicillin-pot clavulanate, atorvastatin, cefaclor, ciprofloxacin, codeine, doxycycline calcium, fenofibrate micronized, fish oil, flagyl [metronidazole hcl], levofloxacin in d5w, metronidazole, ofloxacin, prednisolone, promethazine hcl, tape, tetracycline hcl, erythromycin, and nitrofurantoin.  MEDICATIONS:  Current Outpatient Medications  Medication Sig Dispense Refill  . clorazepate (TRANXENE) 7.5 MG tablet Take 7.5 mg by mouth 2 (two) times daily.  1  . CRESTOR 5 MG tablet Take 5 mg by mouth every evening.   1  . docusate sodium (COLACE) 100 MG capsule Take 1 capsule (100 mg total) by mouth 2 (two) times daily. 30 capsule 0  . esomeprazole (NEXIUM) 40 MG capsule Take 40 mg by mouth daily at 12 noon.    . feeding supplement (ENSURE ENLIVE / ENSURE PLUS) LIQD Take 237 mLs by mouth daily. 237 mL 12  . FLUoxetine (PROZAC) 20 MG capsule Take 20 mg by mouth every morning.    . Loperamide HCl (IMODIUM PO) Take 1 capsule by mouth every 6 (six) hours as needed (loose stool).     . methenamine (HIPREX) 1 g tablet Take 1 g by  mouth daily.     [name] morphine (MSIR) 15 MG tablet Take 0.5-1 tablets (7.5-15 mg total) by mouth every 8 (eight) hours as needed for severe pain. (Patient taking differently: Take 15 mg by mouth every 8 (eight) hours as needed for severe pain. ) 15 tablet 0  . NU-IRON 150 MG capsule Take 150 mg by mouth daily.   2  . ondansetron (ZOFRAN) 8 MG tablet Take 1 tablet (8 mg total) by mouth 2 (two) times daily as needed (Nausea or vomiting). (Patient taking differently: Take 8 mg by mouth 2 (two) times daily as needed for  nausea or vomiting. ) 30 tablet 1  . phenazopyridine (PYRIDIUM) 100 MG tablet Take 1 tablet (100 mg total) by mouth 3 (three) times daily as needed for pain. 10 tablet 0  . polyethylene glycol (MIRALAX / GLYCOLAX) 17 g packet Take 17 g by mouth daily. 14 each 0  . propranolol (INDERAL) 10 MG tablet TAKE 1 TABLET BY MOUTH TWICE A DAY (Patient taking differently: Take 10 mg by mouth daily. ) 180 tablet 2  . tamsulosin (FLOMAX) 0.4 MG CAPS capsule Take 1 capsule (0.4 mg total) by mouth daily. 30 capsule 0   No current facility-administered medications for this visit.    PHYSICAL EXAMINATION: ECOG PERFORMANCE STATUS: 2 - Symptomatic, <50% confined to bed  Vitals:   04/17/20 1000  BP: 106/72  Pulse: 93  Resp: 18  Temp: 99.2 F (37.3 C)  SpO2: 99%   Filed Weights   04/17/20 1000  Weight: 141 lb 1.6 oz (64 kg)    GENERAL:alert, no distress and comfortable SKIN: skin color, texture, turgor are normal, no rashes or significant lesions EYES: normal, Conjunctiva are pink and non-injected, sclera clear NECK: supple, thyroid normal size, non-tender, without nodularity LYMPH:  no palpable lymphadenopathy in the cervical, axillary  LUNGS: clear to auscultation and percussion with normal breathing effort HEART: regular rate & rhythm and no murmurs and no lower extremity edema ABDOMEN:abdomen soft, moderate tenderness in the mid and lower right side abdomen, especially in the right lower quadrant, no rebound, and normal bowel sounds Musculoskeletal:no cyanosis of digits and no clubbing  NEURO: alert & oriented x 3 with fluent speech, no focal motor/sensory deficits  LABORATORY DATA:  I have reviewed the data as listed CBC Latest Ref Rng & Units 04/17/2020 04/08/2020 04/07/2020  WBC 4.0 - 10.5 K/uL 3.1(L) 3.0(L) 2.6(L)  Hemoglobin 12.0 - 15.0 g/dL 10.4(L) 10.6(L) 10.3(L)  Hematocrit 36 - 46 % 33.3(L) 33.9(L) 32.3(L)  Platelets 150 - 400 K/uL 121(L) 95(L) 121(L)     CMP Latest Ref Rng &  Units 04/17/2020 04/09/2020 04/08/2020  Glucose 70 - 99 mg/dL 160(H) 108(H) 94  BUN 8 - 23 mg/dL 15 24(H) 22  Creatinine 0.44 - 1.00 mg/dL 0.91 0.67 0.66  Sodium 135 - 145 mmol/L 142 137 137  Potassium 3.5 - 5.1 mmol/L 3.4(L) 4.1 4.0  Chloride 98 - 111 mmol/L 106 105 104  CO2 22 - 32 mmol/L 28 25 23   Calcium 8.9 - 10.3 mg/dL 9.1 8.6(L) 8.3(L)  Total Protein 6.5 - 8.1 g/dL 6.6 - 5.5(L)  Total Bilirubin 0.3 - 1.2 mg/dL 0.8 - 0.8  Alkaline Phos 38 - 126 U/L 68 - 40  AST 15 - 41 U/L 18 - 23  ALT 0 - 44 U/L 22 - 29      RADIOGRAPHIC STUDIES: I have personally reviewed the radiological images as listed and agreed with the findings in the report. No results  found.   ASSESSMENT & PLAN:  Melinda [name] is a 59 y.o. female with    1.Recurrent squamous cell carcinoma the anus, pT1N0M0 -She initially presented to Dr Theophilus Bones hemorrhoidectomyand Dema Severin 2/5/21withinvasive moderately differentiated squamous cell carcinoma, 1.4 cm,pT1, pNx. -She had local recurrence andunderwent surgical excision under the care of Dr. Dema Severin on 01/11/20. Surgical pathwas consistent with squamous cell carcinoma of the anus.01/28/11 staging PETshowed nonode ordistant metastasis. -She was able to complete 5/6 weeks of the standard concurrent chemoRT with Mitomycin and5FU on due to poor tolerance of local skin irritation, N/V/D. Due to failure to thrive at home and in outpatient I admitted her to Hospital on 03/13/20. She has been hospitalized twice after that. -She is still recovering from recent hospitalization, overall improved -Follow-up in 2 months with lab and PET scan to evaluate her response to chemo and radiation.   2.  Recurrent abdominal pain, right kidney stone -She has been hospitalized 3 times since last week of chemoradiation, mainly for abdominal pain.  She was found to have right kidney stone, status post right ureteroscope and stone removal, laser lithotripsy on March 26, 2020. -She has  had recurrent right lower quadrant abdominal pain since then, required prolonged hospital stay -Pain overall improved, follow-up with urology  3.Iron deficiency anemia -Etiology unclear as to her iron deficiency. S/Physterectomy -Currently managed by PCP, Dr. Joylene Draft. She receives iron infusions with Feraheme PRN, last in9/3/21 -Mild, stable.  4. Diabetes Mellitis  -Continue to f/u with PCP for management.  5. Neutropenia, Thrombocytopenia -Secondary to chemo, overall mild and improved   PLAN: -She continues to improve slowly, overall pain is manageable -She still has 9 tablets of morphine at home, if she needs refill, I will call in hydrocodone for her -I encouraged her to follow-up with Dr. Dema Severin next month -Follow-up with me the first week of January 2022 with lab and PET scan the week before to evaluate response to chemo and radiation.    No problem-specific Assessment & Plan notes found for this encounter.   Orders Placed This Encounter  Procedures  . NM PET Image Restag (PS) Skull Base To Thigh    Standing Status:   Future    Standing Expiration Date:   04/17/2021    Order Specific Question:   If indicated for the ordered procedure, I authorize the administration of a radiopharmaceutical per Radiology protocol    Answer:   Yes    Order Specific Question:   Preferred imaging location?    Answer:   [facility]   All questions were answered. The patient knows to call the clinic with any problems, questions or concerns. No barriers to learning was detected. The total time spent in the appointment was 30 minutes.     Truitt Merle, MD 04/17/2020   I, Joslyn Devon, am acting as scribe for Truitt Merle, MD.   I have reviewed the above documentation for accuracy and completeness, and I agree with the above.

## 2020-04-17 ENCOUNTER — Other Ambulatory Visit: Payer: Self-pay

## 2020-04-17 ENCOUNTER — Encounter: Payer: Self-pay | Admitting: Hematology

## 2020-04-17 ENCOUNTER — Inpatient Hospital Stay: Payer: BC Managed Care – PPO | Attending: Physician Assistant | Admitting: Hematology

## 2020-04-17 ENCOUNTER — Inpatient Hospital Stay: Payer: BC Managed Care – PPO

## 2020-04-17 VITALS — BP 106/72 | HR 93 | Temp 99.2°F | Resp 18 | Ht 66.0 in | Wt 141.1 lb

## 2020-04-17 DIAGNOSIS — D696 Thrombocytopenia, unspecified: Secondary | ICD-10-CM | POA: Diagnosis not present

## 2020-04-17 DIAGNOSIS — C210 Malignant neoplasm of anus, unspecified: Secondary | ICD-10-CM | POA: Diagnosis not present

## 2020-04-17 DIAGNOSIS — Z9049 Acquired absence of other specified parts of digestive tract: Secondary | ICD-10-CM | POA: Diagnosis not present

## 2020-04-17 DIAGNOSIS — N200 Calculus of kidney: Secondary | ICD-10-CM | POA: Insufficient documentation

## 2020-04-17 DIAGNOSIS — D709 Neutropenia, unspecified: Secondary | ICD-10-CM | POA: Insufficient documentation

## 2020-04-17 DIAGNOSIS — D509 Iron deficiency anemia, unspecified: Secondary | ICD-10-CM | POA: Insufficient documentation

## 2020-04-17 DIAGNOSIS — E119 Type 2 diabetes mellitus without complications: Secondary | ICD-10-CM | POA: Insufficient documentation

## 2020-04-17 LAB — CMP (CANCER CENTER ONLY)
ALT: 22 U/L (ref 0–44)
AST: 18 U/L (ref 15–41)
Albumin: 3.3 g/dL — ABNORMAL LOW (ref 3.5–5.0)
Alkaline Phosphatase: 68 U/L (ref 38–126)
Anion gap: 8 (ref 5–15)
BUN: 15 mg/dL (ref 8–23)
CO2: 28 mmol/L (ref 22–32)
Calcium: 9.1 mg/dL (ref 8.9–10.3)
Chloride: 106 mmol/L (ref 98–111)
Creatinine: 0.91 mg/dL (ref 0.44–1.00)
GFR, Estimated: >60 mL/min (ref >60)
Glucose, Bld: 160 mg/dL — ABNORMAL HIGH (ref 70–99)
Potassium: 3.4 mmol/L — ABNORMAL LOW (ref 3.5–5.1)
Sodium: 142 mmol/L (ref 135–145)
Total Bilirubin: 0.8 mg/dL (ref 0.3–1.2)
Total Protein: 6.6 g/dL (ref 6.5–8.1)

## 2020-04-17 LAB — CBC WITH DIFFERENTIAL (CANCER CENTER ONLY)
Abs Immature Granulocytes: 0.02 10*3/uL (ref 0.00–0.07)
Basophils Absolute: 0 10*3/uL (ref 0.0–0.1)
Basophils Relative: 1 %
Eosinophils Absolute: 0.1 10*3/uL (ref 0.0–0.5)
Eosinophils Relative: 2 %
HCT: 33.3 % — ABNORMAL LOW (ref 36.0–46.0)
Hemoglobin: 10.4 g/dL — ABNORMAL LOW (ref 12.0–15.0)
Immature Granulocytes: 1 %
Lymphocytes Relative: 21 %
Lymphs Abs: 0.7 10*3/uL (ref 0.7–4.0)
MCH: 27.4 pg (ref 26.0–34.0)
MCHC: 31.2 g/dL (ref 30.0–36.0)
MCV: 87.9 fL (ref 80.0–100.0)
Monocytes Absolute: 0.2 10*3/uL (ref 0.1–1.0)
Monocytes Relative: 8 %
Neutro Abs: 2.1 10*3/uL (ref 1.7–7.7)
Neutrophils Relative %: 67 %
Platelet Count: 121 10*3/uL — ABNORMAL LOW (ref 150–400)
RBC: 3.79 MIL/uL — ABNORMAL LOW (ref 3.87–5.11)
RDW: 15.7 % — ABNORMAL HIGH (ref 11.5–15.5)
WBC Count: 3.1 10*3/uL — ABNORMAL LOW (ref 4.0–10.5)
nRBC: 0 % (ref 0.0–0.2)

## 2020-04-17 LAB — FERRITIN: Ferritin: 489 ng/mL — ABNORMAL HIGH (ref 11–307)

## 2020-04-17 LAB — IRON AND TIBC
Iron: 79 ug/dL (ref 41–142)
Saturation Ratios: 27 % (ref 21–57)
TIBC: 288 ug/dL (ref 236–444)
UIBC: 209 ug/dL (ref 120–384)

## 2020-04-18 ENCOUNTER — Telehealth: Payer: Self-pay | Admitting: Hematology

## 2020-04-18 NOTE — Telephone Encounter (Signed)
Scheduled per 11/11 los. Pt is aware of appt times and dates.

## 2020-04-24 ENCOUNTER — Telehealth: Payer: Self-pay

## 2020-04-24 NOTE — Telephone Encounter (Signed)
Ms Granderson called requesting refill for Morphine sulfate 15 mg tablets.  She states she takes 2 at a time as needed. She continues to have lower abd pain.  She states it is not as severe as before.

## 2020-04-25 ENCOUNTER — Other Ambulatory Visit: Payer: Self-pay | Admitting: Hematology

## 2020-04-25 MED ORDER — MORPHINE SULFATE 15 MG PO TABS
15.0000 mg | ORAL_TABLET | Freq: Three times a day (TID) | ORAL | 0 refills | Status: DC | PRN
Start: 2020-04-25 — End: 2020-07-11

## 2020-04-28 ENCOUNTER — Inpatient Hospital Stay: Payer: BC Managed Care – PPO

## 2020-04-28 ENCOUNTER — Other Ambulatory Visit: Payer: Self-pay

## 2020-04-28 DIAGNOSIS — Z9049 Acquired absence of other specified parts of digestive tract: Secondary | ICD-10-CM | POA: Diagnosis not present

## 2020-04-28 DIAGNOSIS — Z23 Encounter for immunization: Secondary | ICD-10-CM

## 2020-04-28 DIAGNOSIS — D709 Neutropenia, unspecified: Secondary | ICD-10-CM | POA: Diagnosis not present

## 2020-04-28 DIAGNOSIS — N390 Urinary tract infection, site not specified: Secondary | ICD-10-CM | POA: Diagnosis not present

## 2020-04-28 DIAGNOSIS — E119 Type 2 diabetes mellitus without complications: Secondary | ICD-10-CM | POA: Diagnosis not present

## 2020-04-28 DIAGNOSIS — D509 Iron deficiency anemia, unspecified: Secondary | ICD-10-CM | POA: Diagnosis not present

## 2020-04-28 DIAGNOSIS — B955 Unspecified streptococcus as the cause of diseases classified elsewhere: Secondary | ICD-10-CM | POA: Diagnosis not present

## 2020-04-28 DIAGNOSIS — N130 Hydronephrosis with ureteropelvic junction obstruction: Secondary | ICD-10-CM | POA: Diagnosis not present

## 2020-04-28 DIAGNOSIS — N200 Calculus of kidney: Secondary | ICD-10-CM | POA: Diagnosis not present

## 2020-04-28 DIAGNOSIS — C210 Malignant neoplasm of anus, unspecified: Secondary | ICD-10-CM | POA: Diagnosis not present

## 2020-04-28 DIAGNOSIS — D696 Thrombocytopenia, unspecified: Secondary | ICD-10-CM | POA: Diagnosis not present

## 2020-04-28 DIAGNOSIS — N3000 Acute cystitis without hematuria: Secondary | ICD-10-CM | POA: Diagnosis not present

## 2020-05-05 ENCOUNTER — Other Ambulatory Visit: Payer: Self-pay | Admitting: Hematology

## 2020-05-05 DIAGNOSIS — C210 Malignant neoplasm of anus, unspecified: Secondary | ICD-10-CM

## 2020-05-08 ENCOUNTER — Telehealth: Payer: Self-pay

## 2020-05-08 NOTE — Telephone Encounter (Signed)
Called spoke with pt explained new appt for CT scan and insurance denial for pet scan made pt aware appeal was received and again denied pt understands and is agreeable to complete CTscan she will come to pick up contrast and report for appt 05/14/20 at 1315 for check in

## 2020-05-09 ENCOUNTER — Ambulatory Visit (HOSPITAL_COMMUNITY): Payer: BC Managed Care – PPO

## 2020-05-11 NOTE — Progress Notes (Signed)
  Radiation Oncology         [phone] ________________________________  Name: Melinda [name] MRN: [id number]  Date: 03/07/2020  DOB: 1960-10-22  End of Treatment Note  Diagnosis: anal cancer     Indication for treatment:  curative       Radiation treatment dates:   02/04/20 - 03/07/20  Site/dose:   The patient was treated with a course of IMRT using a simultaneous integrated boost technique. Daily image guidance was using during the treatment. The high dose region was planned to receive a total of 50.4 Gy.  Narrative: The patient tolerated radiation treatment but experienced significant irritation towards the end of treatment. The patient decided to discontinue XRT after 24 fractions totaling 43.2 Gy.  Plan: The patient has completed radiation treatment. The patient will return to radiation oncology clinic for routine followup in one month. I advised the patient to call or return sooner if they have any questions or concerns related to their recovery or treatment. ________________________________  Jodelle Gross, M.D., Ph.D.

## 2020-05-12 DIAGNOSIS — C210 Malignant neoplasm of anus, unspecified: Secondary | ICD-10-CM | POA: Diagnosis not present

## 2020-05-14 ENCOUNTER — Ambulatory Visit (HOSPITAL_COMMUNITY): Payer: BC Managed Care – PPO

## 2020-05-19 ENCOUNTER — Telehealth: Payer: Self-pay

## 2020-05-19 NOTE — Telephone Encounter (Signed)
TC to Pt to inform her that her scan appointment has been changed from a pet scan to a Ct scan. Pt informed me that she has received an appeal letter from her insurance company stating that her Pet scan was approved. Pt. Stated that she would like the Pet scan not the CT Scan and was perturbed that the appointment was canceled with out informing her first. Pt's husband dropped off a copy of approval letter. Message sent to [facility] and [facility]per [facility] to review.

## 2020-05-21 ENCOUNTER — Encounter (HOSPITAL_COMMUNITY): Payer: BC Managed Care – PPO

## 2020-05-22 ENCOUNTER — Other Ambulatory Visit: Payer: Self-pay | Admitting: Internal Medicine

## 2020-05-29 DIAGNOSIS — E119 Type 2 diabetes mellitus without complications: Secondary | ICD-10-CM | POA: Diagnosis not present

## 2020-05-29 DIAGNOSIS — Z23 Encounter for immunization: Secondary | ICD-10-CM | POA: Diagnosis not present

## 2020-05-30 DIAGNOSIS — C211 Malignant neoplasm of anal canal: Secondary | ICD-10-CM | POA: Diagnosis not present

## 2020-06-04 ENCOUNTER — Inpatient Hospital Stay: Payer: BC Managed Care – PPO | Attending: Physician Assistant

## 2020-06-04 ENCOUNTER — Ambulatory Visit (HOSPITAL_COMMUNITY): Payer: BC Managed Care – PPO

## 2020-06-04 ENCOUNTER — Other Ambulatory Visit: Payer: Self-pay

## 2020-06-04 ENCOUNTER — Encounter (HOSPITAL_COMMUNITY)
Admission: RE | Admit: 2020-06-04 | Discharge: 2020-06-04 | Disposition: A | Discharge status: Home / Self Care | Payer: BC Managed Care – PPO | Source: Ambulatory Visit | Attending: Hematology | Admitting: Hematology

## 2020-06-04 ENCOUNTER — Other Ambulatory Visit: Payer: BC Managed Care – PPO

## 2020-06-04 DIAGNOSIS — C210 Malignant neoplasm of anus, unspecified: Secondary | ICD-10-CM | POA: Diagnosis not present

## 2020-06-04 DIAGNOSIS — E119 Type 2 diabetes mellitus without complications: Secondary | ICD-10-CM | POA: Diagnosis not present

## 2020-06-04 DIAGNOSIS — D709 Neutropenia, unspecified: Secondary | ICD-10-CM | POA: Diagnosis not present

## 2020-06-04 DIAGNOSIS — N200 Calculus of kidney: Secondary | ICD-10-CM | POA: Diagnosis not present

## 2020-06-04 DIAGNOSIS — D696 Thrombocytopenia, unspecified: Secondary | ICD-10-CM | POA: Diagnosis not present

## 2020-06-04 DIAGNOSIS — Z9049 Acquired absence of other specified parts of digestive tract: Secondary | ICD-10-CM | POA: Diagnosis not present

## 2020-06-04 DIAGNOSIS — D509 Iron deficiency anemia, unspecified: Secondary | ICD-10-CM | POA: Insufficient documentation

## 2020-06-04 LAB — CBC WITH DIFFERENTIAL (CANCER CENTER ONLY)
Abs Immature Granulocytes: 0 10*3/uL (ref 0.00–0.07)
Basophils Absolute: 0 10*3/uL (ref 0.0–0.1)
Basophils Relative: 1 %
Eosinophils Absolute: 0.1 10*3/uL (ref 0.0–0.5)
Eosinophils Relative: 3 %
HCT: 34.7 % — ABNORMAL LOW (ref 36.0–46.0)
Hemoglobin: 11.2 g/dL — ABNORMAL LOW (ref 12.0–15.0)
Immature Granulocytes: 0 %
Lymphocytes Relative: 25 %
Lymphs Abs: 0.7 10*3/uL (ref 0.7–4.0)
MCH: 27.7 pg (ref 26.0–34.0)
MCHC: 32.3 g/dL (ref 30.0–36.0)
MCV: 85.9 fL (ref 80.0–100.0)
Monocytes Absolute: 0.2 10*3/uL (ref 0.1–1.0)
Monocytes Relative: 8 %
Neutro Abs: 1.9 10*3/uL (ref 1.7–7.7)
Neutrophils Relative %: 63 %
Platelet Count: 149 10*3/uL — ABNORMAL LOW (ref 150–400)
RBC: 4.04 MIL/uL (ref 3.87–5.11)
RDW: 13.2 % (ref 11.5–15.5)
WBC Count: 3 10*3/uL — ABNORMAL LOW (ref 4.0–10.5)
nRBC: 0 % (ref 0.0–0.2)

## 2020-06-04 LAB — CMP (CANCER CENTER ONLY)
ALT: 15 U/L (ref 0–44)
AST: 17 U/L (ref 15–41)
Albumin: 3.6 g/dL (ref 3.5–5.0)
Alkaline Phosphatase: 39 U/L (ref 38–126)
Anion gap: 5 (ref 5–15)
BUN: 18 mg/dL (ref 8–23)
CO2: 30 mmol/L (ref 22–32)
Calcium: 9.2 mg/dL (ref 8.9–10.3)
Chloride: 109 mmol/L (ref 98–111)
Creatinine: 0.75 mg/dL (ref 0.44–1.00)
GFR, Estimated: >60 mL/min (ref >60)
Glucose, Bld: 124 mg/dL — ABNORMAL HIGH (ref 70–99)
Potassium: 3.5 mmol/L (ref 3.5–5.1)
Sodium: 144 mmol/L (ref 135–145)
Total Bilirubin: 0.3 mg/dL (ref 0.3–1.2)
Total Protein: 6.4 g/dL — ABNORMAL LOW (ref 6.5–8.1)

## 2020-06-04 LAB — IRON AND TIBC
Iron: 74 ug/dL (ref 41–142)
Saturation Ratios: 26 % (ref 21–57)
TIBC: 289 ug/dL (ref 236–444)
UIBC: 215 ug/dL (ref 120–384)

## 2020-06-04 LAB — GLUCOSE, CAPILLARY: Glucose-Capillary: 113 mg/dL — ABNORMAL HIGH (ref 70–99)

## 2020-06-04 LAB — FERRITIN: Ferritin: 75 ng/mL (ref 11–307)

## 2020-06-04 MED ORDER — FLUDEOXYGLUCOSE F - 18 (FDG) INJECTION
7.0000 | Freq: Once | INTRAVENOUS | Status: AC
  Administered 2020-06-04: 11:00:00 7 via INTRAVENOUS

## 2020-06-09 NOTE — Progress Notes (Signed)
[facility]   Telephone:(336) [phone] [phone]) [phone]   Clinic Follow up Note   Patient Care Team: Rodrigo Ran, MD as PCP - General (Internal Medicine) Pricilla Riffle, MD as PCP - Cardiology (Cardiology) Exie Parody, MD (Hematology and Oncology) Heilingoetter, Johnette Abraham, PA-C as Physician Assistant (Oncology) Malachy Mood, MD as Consulting Physician (Oncology) Radonna Ricker, RN as Oncology Nurse Navigator Andria Meuse, MD as Consulting Physician (General Surgery)  Date of Service:  06/10/2020  CHIEF COMPLAINT: F/u ofSquamous cell carcinoma of the anus  SUMMARY OF ONCOLOGIC HISTORY: Oncology History Overview Note  Cancer Staging No matching staging information was found for the patient.    Squamous cell cancer, anus (HCC)  07/13/2019 Procedure    EUA, Excision of posterior internal/external hemorrhoid under the care of Dr. Daphine Deutscher    07/13/2019 Pathology Results   - Invasive moderately differentiated squamous cell carcinoma, 1.4 cm.  See comment  - Carcinoma invades for depth of 0.4 cm  - Deep resection margin is negative for carcinoma (0.2 cm)  - Lateral mucosal margin is positive for high-grade dysplasia  - No evidence of lymphovascular perineural invasion  Procedure: Local excision  Tumor Site: Anal canal  Tumor Size: 1.4 cm  Histologic Type: Invasive squamous cell carcinoma  Histologic Grade: G2: Moderately differentiated  Tumor Extension: Carcinoma invades superficial anal sphincter muscle  Margins: Uninvolved by tumor  Treatment Effect: N/A  Regional Lymph Nodes: No lymph nodes submitted or found  Pathologic Stage Classification (pTNM, AJCC 8th Edition):  pT1, pNx  Representative Tumor Block: A1  Comment(s): Lateral mucosal margin is positive for high-grade squamous  dysplasia      07/13/2019 Initial Diagnosis   Squamous cell cancer, anus (HCC)   09/04/2019 Imaging   CT scan Chest, Abdomen, and Pelvis   IMPRESSION: 1. No evidence of  metastatic disease in the chest, abdomen or pelvis. 2. No discrete anorectal mass. 3. Chronic findings include: Punctate nonobstructing upper right renal stones and chronic right renal scarring. 4. Aortic Atherosclerosis (ICD10-I70.0).   01/10/2020 Pathology Results   A. ANAL LESION, POSTERIOR MIDLINE, EXCISION:  - Squamous cell carcinoma, moderately differentiated.  Verbally reported by Dr. Luisa Hart 1.5 cm, negative margins, depth <1 mm   01/11/2020 Procedure   1. Excision of anal canal lesion (posterior midline) under the care of Dr. Cliffton Asters     01/28/2020 PET scan   IMPRESSION: 1. Mild focal anal hypermetabolism without discrete mass correlate on the CT images, nonspecific, differential includes postsurgical change or residual anal tumor. 2. No hypermetabolic locoregional or distant metastatic disease. 3. Chronic findings include: Aortic Atherosclerosis (ICD10-I70.0). Diffuse hepatic steatosis. Nonobstructing right nephrolithiasis.     02/04/2020 - 03/07/2020 Radiation Therapy   concurrent chemoRT by Dr Mitzi Hansen with Mitomycin and 5FU starting 02/04/20-03/07/20. The last 4 session cancelled due to poor toleration.    02/04/2020 - 03/03/2020 Chemotherapy   Concurrent chemoRT with Mitomycin and 5FU on week 1 and 5 starting 02/04/20-03/03/20   03/13/2020 Imaging   CT AP W contrast  IMPRESSION: Diffuse wall thickening with inflammatory changes involving a long segment of distal ileum, cecum, and sigmoid colon likely represents enteritis/colitis. This could be due to infectious, or inflammatory nature, not thought to represent ischemic colitis.   Hepatic steatosis   Trace pericardial effusion   Small free fluid in the deep pelvis   06/04/2020 Imaging   PET IMPRESSION: 1. Diminished metabolic uptake about the rectum with presumed post radiation changes showing mild perirectal stranding and perianal stranding  on today's exam. Continued correlation with direct visualization may be  helpful to exclude any residual tumor in this location but diminished FDG uptake is encouraging and findings could certainly be seen in the setting of post treatment change. 2. No signs of metastatic disease to the neck, chest, abdomen or pelvis. 3. Diminished perienteric stranding about distal small bowel with persistent mild stranding and stool like material in distal small bowel likely related to enteritis and associated with mild but diminished FDG uptake. Attention on follow-up. 4. Resolution of RIGHT-sided hydronephrosis with signs of nephrolithiasis as before. 5. Mild asymmetry of metabolic activity along the RIGHT chest wall associated serrated S musculature adjacent to the tip of the scapula felt to represent asymmetric muscular activity. Correlate with any symptoms in this area and suggest attention on follow-up.   Aortic Atherosclerosis (ICD10-I70.0).      CURRENT THERAPY:  surveillance   INTERVAL HISTORY:  Melinda [name] is here for a follow up. She presents to the clinic with her husband.  Doing well, overall much better, eats normally, gained weight  No abdominal pain, still has mild constipation, and mild hemochezia  Energy level good, functions well at home   All other systems were reviewed with the patient and are negative.  MEDICAL HISTORY:  Past Medical History:  Diagnosis Date  . anal ca dx'd 07/2019  . Diabetes mellitus type 2, controlled ([place])   . Dyslipidemia    untreated  . GERD (gastroesophageal reflux disease)   . Hemorrhoids   . Microcytic anemia 06/22/2012  . Nephrolith   . Palpitation    positive tilt table test. Prozac and Inderal treatment effective.    SURGICAL HISTORY: Past Surgical History:  Procedure Laterality Date  . APPENDECTOMY    . BREAST REDUCTION SURGERY    . CARDIAC CATHETERIZATION  2002   normal LV function and no significant coronary obstruction  . CATARACT EXTRACTION, BILATERAL Bilateral november 2020 and dcember 2020   . CYST EXCISION N/A 01/11/2020   Procedure: EXCISION OF ANAL CANAL MASS;  Surgeon: Ileana Roup, MD;  Location: [facility];  Service: General;  Laterality: N/A;  . CYSTOSCOPY/URETEROSCOPY/HOLMIUM LASER/STENT PLACEMENT Right 03/26/2020   Procedure: CYSTOSCOPY/URETEROSCOPY/HOLMIUM LASER/STENT PLACEMENT;  Surgeon: [name], MD;  Location: [facility];  Service: Urology;  Laterality: Right;  . ESOPHAGEAL MANOMETRY N/A 04/04/2017   Procedure: ESOPHAGEAL MANOMETRY (EM);  Surgeon: Laurence Spates, MD;  [facility];  Service: Endoscopy;  Laterality: N/A;  . HEMORRHOID SURGERY N/A 07/13/2019   Procedure: HEMORRHOIDECTOMY;  Surgeon: Johnathan Hausen, MD;  Location: [facility];  Service: General;  Laterality: N/A;  . KIDNEY SURGERY     kidney stones   . PARTIAL HYSTERECTOMY    . RECTAL EXAM UNDER ANESTHESIA N/A 01/11/2020   Procedure: RECTAL EXAM UNDER ANESTHESIA;  Surgeon: Ileana Roup, MD;  [facility]: [facility];  Service: General;  Laterality: N/A;  . TONSILLECTOMY      I have reviewed the social history and family history with the patient and they are unchanged from previous note.  ALLERGIES:  is allergic to cephalexin, amoxapine and related, amoxicillin-pot clavulanate, atorvastatin, cefaclor, ciprofloxacin, codeine, doxycycline calcium, fenofibrate micronized, fish oil, flagyl [metronidazole hcl], levofloxacin in d5w, metronidazole, ofloxacin, prednisolone, promethazine hcl, tape, tetracycline hcl, erythromycin, and nitrofurantoin.  MEDICATIONS:  Current Outpatient Medications  Medication Sig Dispense Refill  . clorazepate (TRANXENE) 7.5 MG tablet Take 7.5 mg by mouth 2 (two) times daily.  1  . CRESTOR 5 MG tablet Take 5 mg  by mouth every evening.   1  . docusate sodium (COLACE) 100 MG capsule Take 1 capsule (100 mg total) by mouth 2 (two) times daily. 30 capsule 0  . esomeprazole (NEXIUM) 40 MG capsule Take 40 mg by mouth daily at 12 noon.    . feeding  supplement (ENSURE ENLIVE / ENSURE PLUS) LIQD Take 237 mLs by mouth daily. 237 mL 12  . FLUoxetine (PROZAC) 20 MG capsule Take 20 mg by mouth every morning.    . Loperamide HCl (IMODIUM PO) Take 1 capsule by mouth every 6 (six) hours as needed (loose stool).     . methenamine (HIPREX) 1 g tablet Take 1 g by mouth daily.     [name] morphine (MSIR) 15 MG tablet Take 1-2 tablets (15-30 mg total) by mouth every 8 (eight) hours as needed for severe pain. 45 tablet 0  . NU-IRON 150 MG capsule Take 150 mg by mouth daily.   2  . ondansetron (ZOFRAN) 8 MG tablet Take 1 tablet (8 mg total) by mouth 2 (two) times daily as needed (Nausea or vomiting). (Patient taking differently: Take 8 mg by mouth 2 (two) times daily as needed for nausea or vomiting. ) 30 tablet 1  . phenazopyridine (PYRIDIUM) 100 MG tablet Take 1 tablet (100 mg total) by mouth 3 (three) times daily as needed for pain. 10 tablet 0  . polyethylene glycol (MIRALAX / GLYCOLAX) 17 g packet Take 17 g by mouth daily. 14 each 0  . propranolol (INDERAL) 10 MG tablet Take 1 tablet (10 mg total) by mouth 2 (two) times daily. Please make overdue appt with Dr. Harrington Challenger before anymore refills. Thank you 1st attempt 60 tablet 0  . tamsulosin (FLOMAX) 0.4 MG CAPS capsule Take 1 capsule (0.4 mg total) by mouth daily. 30 capsule 0   No current facility-administered medications for this visit.    PHYSICAL EXAMINATION: ECOG PERFORMANCE STATUS: 1 - Symptomatic but completely ambulatory  Vitals:   06/10/20 1317  BP: 130/76  Pulse: 71  Resp: 16  Temp: (!) 97 F (36.1 C)  SpO2: 100%   Filed Weights   06/10/20 1317  Weight: 151 lb 8 oz (68.7 kg)    GENERAL:alert, no distress and comfortable SKIN: skin color, texture, turgor are normal, no rashes or significant lesions EYES: normal, Conjunctiva are pink and non-injected, sclera clear  NECK: supple, thyroid normal size, non-tender, without nodularity LYMPH:  no palpable lymphadenopathy in the cervical,  axillary  LUNGS: clear to auscultation and percussion with normal breathing effort HEART: regular rate & rhythm and no murmurs and no lower extremity edema ABDOMEN:abdomen soft, non-tender and normal bowel sounds, rectal exam deferred today  Musculoskeletal:no cyanosis of digits and no clubbing  NEURO: alert & oriented x 3 with fluent speech, no focal motor/sensory deficits  LABORATORY DATA:  I have reviewed the data as listed CBC Latest Ref Rng & Units 06/04/2020 04/17/2020 04/08/2020  WBC 4.0 - 10.5 K/uL 3.0(L) 3.1(L) 3.0(L)  Hemoglobin 12.0 - 15.0 g/dL 11.2(L) 10.4(L) 10.6(L)  Hematocrit 36.0 - 46.0 % 34.7(L) 33.3(L) 33.9(L)  Platelets 150 - 400 K/uL 149(L) 121(L) 95(L)     CMP Latest Ref Rng & Units 06/04/2020 04/17/2020 04/09/2020  Glucose 70 - 99 mg/dL 124(H) 160(H) 108(H)  BUN 8 - 23 mg/dL 18 15 24(H)  Creatinine 0.44 - 1.00 mg/dL 0.75 0.91 0.67  Sodium 135 - 145 mmol/L 144 142 137  Potassium 3.5 - 5.1 mmol/L 3.5 3.4(L) 4.1  Chloride 98 - 111 mmol/L  109 106 105  CO2 22 - 32 mmol/L 30 28 25   Calcium 8.9 - 10.3 mg/dL 9.2 9.1 8.6(L)  Total Protein 6.5 - 8.1 g/dL 6.4(L) 6.6 -  Total Bilirubin 0.3 - 1.2 mg/dL 0.3 0.8 -  Alkaline Phos 38 - 126 U/L 39 68 -  AST 15 - 41 U/L 17 18 -  ALT 0 - 44 U/L 15 22 -      RADIOGRAPHIC STUDIES: I have personally reviewed the radiological images as listed and agreed with the findings in the report. No results found.   ASSESSMENT & PLAN:  Melinda [name] is a 60 y.o. female with    1.Recurrent squamous cell carcinoma the anus, pT1N0M0 -She initially presented to Dr Theophilus Bones hemorrhoidectomyand Dema Severin 2/5/21withinvasive moderately differentiated squamous cell carcinoma, 1.4 cm,pT1, pNx. -She had local recurrence andunderwent surgical excision under the care of Dr. Dema Severin on 01/11/20. Surgical pathwas consistent with squamous cell carcinoma of the anus.01/28/11 staging PETshowed nonode ordistant metastasis. -She was only able to  complete 5 out of the planned 6 weeks of the standard concurrent chemoRT with Mitomycin and5FU on due to poor tolerance of local skin irritation, N/V/D and had prolonged recovery -I discussed her PET from 06/04/20 which shows excellent response to treatment, mild residual uptake in her rectal is likely related to treatment.  Her recent anoscope was negative for residual disease. -She is clinically recovering well from treatment, no concern for recurrence -Continue follow-up with Dr. Dema Severin monthly for the near future  -lab and f/u in 4 months -plan to repeat CT scan in 6 months     2.  Recurrent abdominal pain, right kidney stone  -She has been hospitalized 3 times in 03/2020, mainly for abdominal pain. She was found to have right kidney stone, status post right ureteroscope and stone removal, laser lithotripsy on March 26, 2020. -Pain resolved, follow-up with urology  3.Iron deficiency anemia -Etiology unclear as to her iron deficiency. S/Physterectomy -Currently managed by PCP, Dr. Joylene Draft. She receives iron infusions with Feraheme PRN, last in9/3/21 -anemia improved   4. Diabetes Mellitis  -Continue to f/u with PCP for management.  5. Neutropenia, Thrombocytopenia -Secondary to chemo, overall mild and improved   PLAN: -lab and PET scan resolved, she had complete response to treatment  -f/u in 4 months with lab, she is following with Dr. Dema Severin monthly lately    No problem-specific Assessment & Plan notes found for this encounter.   No orders of the defined types were placed in this encounter.  All questions were answered. The patient knows to call the clinic with any problems, questions or concerns. No barriers to learning was detected. The total time spent in the appointment was 30 minutes.     Truitt Merle, MD 06/10/2020   I, Joslyn Devon, am acting as scribe for Truitt Merle, MD.   I have reviewed the above documentation for accuracy and completeness, and I agree  with the above.

## 2020-06-10 ENCOUNTER — Telehealth: Payer: Self-pay | Admitting: Hematology

## 2020-06-10 ENCOUNTER — Other Ambulatory Visit: Payer: Self-pay

## 2020-06-10 ENCOUNTER — Inpatient Hospital Stay: Payer: BC Managed Care – PPO | Attending: Physician Assistant | Admitting: Hematology

## 2020-06-10 VITALS — BP 130/76 | HR 71 | Temp 97.0°F | Resp 16 | Ht 66.0 in | Wt 151.5 lb

## 2020-06-10 DIAGNOSIS — Z79899 Other long term (current) drug therapy: Secondary | ICD-10-CM | POA: Insufficient documentation

## 2020-06-10 DIAGNOSIS — C210 Malignant neoplasm of anus, unspecified: Secondary | ICD-10-CM | POA: Insufficient documentation

## 2020-06-10 DIAGNOSIS — I700 Atherosclerosis of aorta: Secondary | ICD-10-CM | POA: Diagnosis not present

## 2020-06-10 DIAGNOSIS — E119 Type 2 diabetes mellitus without complications: Secondary | ICD-10-CM | POA: Insufficient documentation

## 2020-06-10 DIAGNOSIS — D6959 Other secondary thrombocytopenia: Secondary | ICD-10-CM | POA: Diagnosis not present

## 2020-06-10 DIAGNOSIS — D509 Iron deficiency anemia, unspecified: Secondary | ICD-10-CM | POA: Diagnosis not present

## 2020-06-10 DIAGNOSIS — D701 Agranulocytosis secondary to cancer chemotherapy: Secondary | ICD-10-CM | POA: Insufficient documentation

## 2020-06-10 NOTE — Telephone Encounter (Signed)
Scheduled appointments per 1/4 los. Spoke to patient who is aware of appointments date and time.

## 2020-06-11 ENCOUNTER — Ambulatory Visit: Payer: BC Managed Care – PPO | Admitting: Hematology

## 2020-06-13 DIAGNOSIS — C210 Malignant neoplasm of anus, unspecified: Secondary | ICD-10-CM | POA: Diagnosis not present

## 2020-07-02 DIAGNOSIS — Z6824 Body mass index (BMI) 24.0-24.9, adult: Secondary | ICD-10-CM | POA: Diagnosis not present

## 2020-07-02 DIAGNOSIS — Z01419 Encounter for gynecological examination (general) (routine) without abnormal findings: Secondary | ICD-10-CM | POA: Diagnosis not present

## 2020-07-09 NOTE — Progress Notes (Unsigned)
Cardiology Office Note   Date:  07/11/2020   ID:  [name], Melinda [name], Melinda [name], MRN [id number]  PCP:  Crist Infante, MD  Cardiologist:   Dorris Carnes, MD   F/U of Chest tightness     History of Present Illness: Melinda [name] is a 60 y.o. female with a history of CP. Stress test, cath and cardiac CT in past  All negative     Pt saw B Bhagat in September 2018   Had epigastric discomfort Also had increased palpitations   With and without activity  USN abdomen negative  Emptying study neg  Colon neg  Followed by Dr Oletta Lamas    Recom increased propranolol     I saw her back in Dec 2020  Since seen she was diagnosed with anal cancer   Underwent removal with chemo therapy and radiation   Had kidney stone around same time       Pt says her breathing is OK She deneis CP     She still notes occsaional palpitations  Last a couple min to 5 min  No dizziness  SHe is off of Metformin as A1C is in low 5s     Current Meds  Medication Sig  . clorazepate (TRANXENE) 7.5 MG tablet Take 7.5 mg by mouth 2 (two) times daily.  . CRESTOR 5 MG tablet Take 5 mg by mouth every evening.   . docusate sodium (COLACE) 100 MG capsule Take 1 capsule (100 mg total) by mouth 2 (two) times daily.  [name] esomeprazole (NEXIUM) 40 MG capsule Take 40 mg by mouth daily at 12 noon.  . feeding supplement (ENSURE ENLIVE / ENSURE PLUS) LIQD Take 237 mLs by mouth daily.  [name] FLUoxetine (PROZAC) 20 MG capsule Take 20 mg by mouth every morning.  . methenamine (HIPREX) 1 g tablet Take 1 g by mouth daily.   . polyethylene glycol (MIRALAX / GLYCOLAX) 17 g packet Take 17 g by mouth daily.  . propranolol (INDERAL) 10 MG tablet Take 10 mg by mouth daily.     Allergies:   Cephalexin, Amoxapine and related, Amoxicillin-pot clavulanate, Atorvastatin, Cefaclor, Ciprofloxacin, Codeine, Doxycycline calcium, Fenofibrate micronized, Fish oil, Flagyl [metronidazole hcl], Levofloxacin in d5w, Metronidazole, Ofloxacin, Prednisolone, Promethazine  hcl, Tape, Tetracycline hcl, Erythromycin, and Nitrofurantoin   Past Medical History:  Diagnosis Date  . anal ca dx'd 07/2019  . Diabetes mellitus type 2, controlled ([place])   . Dyslipidemia    untreated  . GERD (gastroesophageal reflux disease)   . Hemorrhoids   . Microcytic anemia 06/22/2012  . Nephrolith   . Palpitation    positive tilt table test. Prozac and Inderal treatment effective.    Past Surgical History:  Procedure Laterality Date  . APPENDECTOMY    . BREAST REDUCTION SURGERY    . CARDIAC CATHETERIZATION  2002   normal LV function and no significant coronary obstruction  . CATARACT EXTRACTION, BILATERAL Bilateral november 2020 and dcember 2020  . CYST EXCISION N/A 01/11/2020   Procedure: EXCISION OF ANAL CANAL MASS;  Surgeon: Ileana Roup, MD;  [facility];  Service: General;  Laterality: N/A;  . CYSTOSCOPY/URETEROSCOPY/HOLMIUM LASER/STENT PLACEMENT Right 03/26/2020   Procedure: CYSTOSCOPY/URETEROSCOPY/HOLMIUM LASER/STENT PLACEMENT;  Surgeon: [name], MD;  [facility];  Service: Urology;  Laterality: Right;  . ESOPHAGEAL MANOMETRY N/A 10/[name]/2018   Procedure: ESOPHAGEAL MANOMETRY (EM);  Surgeon: Laurence Spates, MD;  [facility];  Service: Endoscopy;  Laterality: N/A;  . HEMORRHOID SURGERY N/A 07/13/2019  Procedure: HEMORRHOIDECTOMY;  Surgeon: Johnathan Hausen, MD;  Location: [facility];  Service: General;  Laterality: N/A;  . KIDNEY SURGERY     kidney stones   . PARTIAL HYSTERECTOMY    . RECTAL EXAM UNDER ANESTHESIA N/A 01/11/2020   Procedure: RECTAL EXAM UNDER ANESTHESIA;  Surgeon: Ileana Roup, MD;  [facility];  Service: General;  Laterality: N/A;  . TONSILLECTOMY       Social History:  The patient  reports that she quit smoking about 3 years ago. Her smoking use included e-cigarettes. She has never used smokeless tobacco. She reports that she does not drink alcohol and does not use drugs.   Family  History:  The patient's family history includes Cancer in her father; Cancer (age of onset: 44) in her maternal grandmother; Coronary artery disease in an other family member; Diabetes in an other family member; Heart attack in her father and another family member; Heart disease in her father and mother; Liver disease in her mother.    ROS:  Please see the history of present illness. All other systems are reviewed and  Negative to the above problem except as noted.    PHYSICAL EXAM: VS:  BP (!) 104/58   Pulse 78   Ht 5\' 6"  (1.676 m)   Wt 150 lb 12.8 oz (68.4 kg)   SpO2 98%   BMI 24.34 kg/m   GEN: Well nourished, well developed, in no acute distress  HEENT: normal  Neck: JVP is not elevated    Cardiac: RRR; no murmurs, no LE edema  Respiratory:  clear to auscultation bilaterally GI: soft, nontender, nondistended, + BS  No hepatomegaly  MS: no deformity Moving all extremities   Skin: warm and dry, no rash Neuro:  Strength and sensation are intact Psych: euthymic mood, full affect   EKG:  EKG is not done today  Lipid Panel    Component Value Date/Time   CHOL 140 04/09/2020 0535   CHOL 152 05/15/2018 1301   TRIG 278 (H) 04/09/2020 0535   HDL 24 (L) 04/09/2020 0535   HDL 32 (L) 05/15/2018 1301   CHOLHDL 5.8 04/09/2020 0535   VLDL 56 (H) 04/09/2020 0535   LDLCALC 60 04/09/2020 0535   LDLCALC 61 05/15/2018 1301      Wt Readings from Last 3 Encounters:  07/11/20 150 lb 12.8 oz (68.4 kg)  06/10/20 151 lb 8 oz (68.7 kg)  12/[name]/21 149 lb (67.6 kg)      ASSESSMENT AND PLAN:  1  CP Pt with hx of CP   Has had extensive eval in past   Neg   She is asymptomatic      2  Palpitations  Occasional episdoes  Short lived  Few seconds  Follow   3   Lipids Last LDL 60   HDL 24      4   Endocrine hemoglobin A1C wws good   Follow    I will set to see in 1 years time sooner with problems.  Current medicines are reviewed at length with the patient today.  The patient does not have  concerns regarding medicines.  Signed, Dorris Carnes, MD  07/11/2020 3:14 PM    [facility] [address], [place], [place]  [address] Phone: [phone]; Fax: [phone]

## 2020-07-11 ENCOUNTER — Other Ambulatory Visit: Payer: Self-pay

## 2020-07-11 ENCOUNTER — Ambulatory Visit (INDEPENDENT_AMBULATORY_CARE_PROVIDER_SITE_OTHER): Payer: BC Managed Care – PPO | Admitting: Internal Medicine

## 2020-07-11 ENCOUNTER — Encounter: Payer: Self-pay | Admitting: Internal Medicine

## 2020-07-11 VITALS — BP 104/58 | HR 78 | Ht 66.0 in | Wt 150.8 lb

## 2020-07-11 DIAGNOSIS — R079 Chest pain, unspecified: Secondary | ICD-10-CM | POA: Diagnosis not present

## 2020-07-11 NOTE — Patient Instructions (Signed)
Medication Instructions:  Your physician recommends that you continue on your current medications as directed. Please refer to the Current Medication list given to you today.  *If you need a refill on your cardiac medications before your next appointment, please call your pharmacy*   Lab Work: None today If you have labs (blood work) drawn today and your tests are completely normal, you will receive your results only by: [name] MyChart Message (if you have MyChart) OR . A paper copy in the mail If you have any lab test that is abnormal or we need to change your treatment, we will call you to review the results.   Follow-Up: At [facility], you and your health needs are our priority.  As part of our continuing mission to provide you with exceptional heart care, we have created designated Provider Care Teams.  These Care Teams include your primary Cardiologist (physician) and Advanced Practice Providers (APPs -  Physician Assistants and Nurse Practitioners) who all work together to provide you with the care you need, when you need it.  Your next appointment:   9 month(s)  The format for your next appointment:   In Person  Provider:   You may see Dorris Carnes, MD or one of the following Advanced Practice Providers on your designated Care Team:    Richardson Dopp, PA-C  [place], [place]

## 2020-07-26 ENCOUNTER — Other Ambulatory Visit: Payer: Self-pay | Admitting: Internal Medicine

## 2020-07-28 DIAGNOSIS — C210 Malignant neoplasm of anus, unspecified: Secondary | ICD-10-CM | POA: Diagnosis not present

## 2020-08-19 DIAGNOSIS — Z1231 Encounter for screening mammogram for malignant neoplasm of breast: Secondary | ICD-10-CM | POA: Diagnosis not present

## 2020-08-19 DIAGNOSIS — Z13820 Encounter for screening for osteoporosis: Secondary | ICD-10-CM | POA: Diagnosis not present

## 2020-08-20 DIAGNOSIS — F3341 Major depressive disorder, recurrent, in partial remission: Secondary | ICD-10-CM | POA: Diagnosis not present

## 2020-08-20 DIAGNOSIS — F411 Generalized anxiety disorder: Secondary | ICD-10-CM | POA: Diagnosis not present

## 2020-08-27 DIAGNOSIS — C210 Malignant neoplasm of anus, unspecified: Secondary | ICD-10-CM | POA: Diagnosis not present

## 2020-09-10 DIAGNOSIS — E119 Type 2 diabetes mellitus without complications: Secondary | ICD-10-CM | POA: Diagnosis not present

## 2020-09-10 DIAGNOSIS — E785 Hyperlipidemia, unspecified: Secondary | ICD-10-CM | POA: Diagnosis not present

## 2020-09-25 DIAGNOSIS — Z1331 Encounter for screening for depression: Secondary | ICD-10-CM | POA: Diagnosis not present

## 2020-09-25 DIAGNOSIS — R82998 Other abnormal findings in urine: Secondary | ICD-10-CM | POA: Diagnosis not present

## 2020-09-25 DIAGNOSIS — D649 Anemia, unspecified: Secondary | ICD-10-CM | POA: Diagnosis not present

## 2020-09-25 DIAGNOSIS — E119 Type 2 diabetes mellitus without complications: Secondary | ICD-10-CM | POA: Diagnosis not present

## 2020-09-25 DIAGNOSIS — Z0000 Encounter for general adult medical examination without abnormal findings: Secondary | ICD-10-CM | POA: Diagnosis not present

## 2020-09-25 DIAGNOSIS — M810 Age-related osteoporosis without current pathological fracture: Secondary | ICD-10-CM | POA: Diagnosis not present

## 2020-10-03 NOTE — Progress Notes (Signed)
[place]   Telephone:(336) [phone] [phone]   Clinic Follow up Note   Patient Care Team: Crist Infante, MD as PCP - General (Internal Medicine) Fay Records, MD as PCP - Cardiology (Cardiology) Nobie Putnam, MD (Hematology and Oncology) Heilingoetter, Tobe Sos, PA-C as Physician Assistant (Oncology) Truitt Merle, MD as Consulting Physician (Oncology) Jonnie Finner, RN as Oncology Nurse Navigator Ileana Roup, MD as Consulting Physician (General Surgery)  Date of Service:  10/08/2020  CHIEF COMPLAINT: F/u ofSquamous cell carcinoma of the anus  SUMMARY OF ONCOLOGIC HISTORY: Oncology History Overview Note  Cancer Staging No matching staging information was found for the patient.    Squamous cell cancer, anus ([place])  07/13/2019 Procedure    EUA, Excision of posterior internal/external hemorrhoid under the care of Dr. Hassell Done    07/13/2019 Pathology Results   - Invasive moderately differentiated squamous cell carcinoma, 1.4 cm.  See comment  - Carcinoma invades for depth of 0.4 cm  - Deep resection margin is negative for carcinoma (0.2 cm)  - Lateral mucosal margin is positive for high-grade dysplasia  - No evidence of lymphovascular perineural invasion  Procedure: Local excision  Tumor Site: Anal canal  Tumor Size: 1.4 cm  Histologic Type: Invasive squamous cell carcinoma  Histologic Grade: G2: Moderately differentiated  Tumor Extension: Carcinoma invades superficial anal sphincter muscle  Margins: Uninvolved by tumor  Treatment Effect: N/A  Regional Lymph Nodes: No lymph nodes submitted or found  Pathologic Stage Classification (pTNM, AJCC 8th Edition):  pT1, pNx  Representative Tumor Block: A1  Comment(s): Lateral mucosal margin is positive for high-grade squamous  dysplasia      07/13/2019 Initial Diagnosis   Squamous cell cancer, anus ([place])   09/04/2019 Imaging   CT scan Chest, Abdomen, and Pelvis   IMPRESSION: 1. No evidence of  metastatic disease in the chest, abdomen or pelvis. 2. No discrete anorectal mass. 3. Chronic findings include: Punctate nonobstructing upper right renal stones and chronic right renal scarring. 4. Aortic Atherosclerosis (ICD10-I70.0).   01/10/2020 Pathology Results   A. ANAL LESION, POSTERIOR MIDLINE, EXCISION:  - Squamous cell carcinoma, moderately differentiated.  Verbally reported by Dr. Saralyn Pilar 1.5 cm, negative margins, depth <1 mm   01/11/2020 Procedure   1. Excision of anal canal lesion (posterior midline) under the care of Dr. Dema Severin     01/28/2020 PET scan   IMPRESSION: 1. Mild focal anal hypermetabolism without discrete mass correlate on the CT images, nonspecific, differential includes postsurgical change or residual anal tumor. 2. No hypermetabolic locoregional or distant metastatic disease. 3. Chronic findings include: Aortic Atherosclerosis (ICD10-I70.0). Diffuse hepatic steatosis. Nonobstructing right nephrolithiasis.     02/04/2020 - 03/07/2020 Radiation Therapy   concurrent chemoRT by Dr Lisbeth Renshaw with Mitomycin and 5FU starting 02/04/20-03/07/20. The last 4 session cancelled due to poor toleration.    02/04/2020 - 03/03/2020 Chemotherapy   Concurrent chemoRT with Mitomycin and 5FU on week 1 and 5 starting 02/04/20-03/03/20   03/13/2020 Imaging   CT AP W contrast  IMPRESSION: Diffuse wall thickening with inflammatory changes involving a long segment of distal ileum, cecum, and sigmoid colon likely represents enteritis/colitis. This could be due to infectious, or inflammatory nature, not thought to represent ischemic colitis.   Hepatic steatosis   Trace pericardial effusion   Small free fluid in the deep pelvis   06/04/2020 Imaging   PET IMPRESSION: 1. Diminished metabolic uptake about the rectum with presumed post radiation changes showing mild perirectal stranding and perianal stranding  on today's exam. Continued correlation with direct visualization may be  helpful to exclude any residual tumor in this location but diminished FDG uptake is encouraging and findings could certainly be seen in the setting of post treatment change. 2. No signs of metastatic disease to the neck, chest, abdomen or pelvis. 3. Diminished perienteric stranding about distal small bowel with persistent mild stranding and stool like material in distal small bowel likely related to enteritis and associated with mild but diminished FDG uptake. Attention on follow-up. 4. Resolution of RIGHT-sided hydronephrosis with signs of nephrolithiasis as before. 5. Mild asymmetry of metabolic activity along the RIGHT chest wall associated serrated S musculature adjacent to the tip of the scapula felt to represent asymmetric muscular activity. Correlate with any symptoms in this area and suggest attention on follow-up.   Aortic Atherosclerosis (ICD10-I70.0).      CURRENT THERAPY:  surveillance  INTERVAL HISTORY:  Melinda [name] is here for a follow up of anal cancer. She was last seen by me 4 months ago. She presents to the clinic with her husband. She notes she is doing well. She notes she has recovered about 80% since stopping treatment. She notes she still has hair loss. She denies abdominal issues. She plans to f/u with her Urologist in a few weeks. She notes her BM is constipation. She uses colase which helps this. I reviewed her medication list with her. She denies any other rectal issues since treatment.  She notes her PCP discussed getting a second COVID booster. She notes her Gyn wants work up for cancer given family history of Gyn Cancer. She notes she has had partial hysterectomy.    REVIEW OF SYSTEMS:   Constitutional: Denies fevers, chills or abnormal weight loss Eyes: Denies blurriness of vision Ears, nose, mouth, throat, and face: Denies mucositis or sore throat Respiratory: Denies cough, dyspnea or wheezes Cardiovascular: Denies palpitation, chest discomfort or  lower extremity swelling Gastrointestinal:  Denies nausea, heartburn (+) Constipation  Skin: Denies abnormal skin rashes (+) Hair loss  Lymphatics: Denies new lymphadenopathy or easy bruising Neurological:Denies numbness, tingling or new weaknesses Behavioral/Psych: Mood is stable, no new changes  All other systems were reviewed with the patient and are negative.  MEDICAL HISTORY:  Past Medical History:  Diagnosis Date  . anal ca dx'd 07/2019  . Diabetes mellitus type 2, controlled ([place])   . Dyslipidemia    untreated  . GERD (gastroesophageal reflux disease)   . Hemorrhoids   . Microcytic anemia 06/22/2012  . Nephrolith   . Palpitation    positive tilt table test. Prozac and Inderal treatment effective.    SURGICAL HISTORY: Past Surgical History:  Procedure Laterality Date  . APPENDECTOMY    . BREAST REDUCTION SURGERY    . CARDIAC CATHETERIZATION  2002   normal LV function and no significant coronary obstruction  . CATARACT EXTRACTION, BILATERAL Bilateral november 2020 and dcember 2020  . CYST EXCISION N/A 01/11/2020   Procedure: EXCISION OF ANAL CANAL MASS;  Surgeon: Ileana Roup, MD;  Location: [facility];  Service: General;  Laterality: N/A;  . CYSTOSCOPY/URETEROSCOPY/HOLMIUM LASER/STENT PLACEMENT Right 03/26/2020   Procedure: CYSTOSCOPY/URETEROSCOPY/HOLMIUM LASER/STENT PLACEMENT;  Surgeon: [name], MD;  Location: [facility];  Service: Urology;  Laterality: Right;  . ESOPHAGEAL MANOMETRY N/A 04/04/2017   Procedure: ESOPHAGEAL MANOMETRY (EM);  Surgeon: Laurence Spates, MD;  [facility];  Service: Endoscopy;  Laterality: N/A;  . HEMORRHOID SURGERY N/A 07/13/2019   Procedure: HEMORRHOIDECTOMY;  Surgeon: Johnathan Hausen, MD;  Location: [facility];  Service: General;  Laterality: N/A;  . KIDNEY SURGERY     kidney stones   . PARTIAL HYSTERECTOMY    . RECTAL EXAM UNDER ANESTHESIA N/A 01/11/2020   Procedure: RECTAL EXAM UNDER ANESTHESIA;  Surgeon:  Ileana Roup, MD;  Location: [facility];  Service: General;  Laterality: N/A;  . TONSILLECTOMY      I have reviewed the social history and family history with the patient and they are unchanged from previous note.  ALLERGIES:  is allergic to cephalexin, amoxapine and related, amoxicillin-pot clavulanate, atorvastatin, cefaclor, ciprofloxacin, codeine, doxycycline calcium, fenofibrate micronized, fish oil, flagyl [metronidazole hcl], levofloxacin in d5w, metronidazole, ofloxacin, prednisolone, promethazine hcl, tape, tetracycline hcl, erythromycin, and nitrofurantoin.  MEDICATIONS:  Current Outpatient Medications  Medication Sig Dispense Refill  . clorazepate (TRANXENE) 7.5 MG tablet Take 7.5 mg by mouth 2 (two) times daily.  1  . CRESTOR 5 MG tablet Take 5 mg by mouth every evening.   1  . docusate sodium (COLACE) 100 MG capsule Take 1 capsule (100 mg total) by mouth 2 (two) times daily. 30 capsule 0  . esomeprazole (NEXIUM) 40 MG capsule Take 40 mg by mouth daily at 12 noon.    . feeding supplement (ENSURE ENLIVE / ENSURE PLUS) LIQD Take 237 mLs by mouth daily. 237 mL 12  . FLUoxetine (PROZAC) 20 MG capsule Take 20 mg by mouth every morning.    . methenamine (HIPREX) 1 g tablet Take 1 g by mouth daily.     . polyethylene glycol (MIRALAX / GLYCOLAX) 17 g packet Take 17 g by mouth daily. 14 each 0  . propranolol (INDERAL) 10 MG tablet Take 1 tablet (10 mg total) by mouth daily. 90 tablet 3   No current facility-administered medications for this visit.    PHYSICAL EXAMINATION: ECOG PERFORMANCE STATUS: 1 - Symptomatic but completely ambulatory  Vitals:   10/08/20 1335  BP: 122/72  Pulse: 80  Resp: 18  Temp: 97.6 F (36.4 C)  SpO2: 96%   Filed Weights   10/08/20 1335  Weight: 154 lb 4.8 oz (70 kg)    GENERAL:alert, no distress and comfortable SKIN: skin color, texture, turgor are normal, no rashes or significant lesions EYES: normal, Conjunctiva are pink and non-injected,  sclera clear  NECK: supple, thyroid normal size, non-tender, without nodularity LYMPH:  no palpable lymphadenopathy in the cervical, axillary or inguinal LUNGS: clear to auscultation and percussion with normal breathing effort HEART: regular rate & rhythm and no murmurs and no lower extremity edema ABDOMEN:abdomen soft, non-tender and normal bowel sounds Musculoskeletal:no cyanosis of digits and no clubbing  NEURO: alert & oriented x 3 with fluent speech, no focal motor/sensory deficits RECTAL: (+) Mild external hemorrhoids. No palpable mass. No blood on glove. Normal stool present. Benign exam    LABORATORY DATA:  I have reviewed the data as listed CBC Latest Ref Rng & Units 10/08/2020 06/04/2020 04/17/2020  WBC 4.0 - 10.5 K/uL 3.2(L) 3.0(L) 3.1(L)  Hemoglobin 12.0 - 15.0 g/dL 11.8(L) 11.2(L) 10.4(L)  Hematocrit 36.0 - 46.0 % 36.3 34.7(L) 33.3(L)  Platelets 150 - 400 K/uL 184 149(L) 121(L)     CMP Latest Ref Rng & Units 10/08/2020 06/04/2020 04/17/2020  Glucose 70 - 99 mg/dL 164(H) 124(H) 160(H)  BUN 8 - 23 mg/dL 25(H) 18 15  Creatinine 0.44 - 1.00 mg/dL 0.97 0.75 0.91  Sodium 135 - 145 mmol/L 141 144 142  Potassium 3.5 - 5.1 mmol/L 4.1 3.5 3.4(L)  Chloride  98 - 111 mmol/L 107 109 106  CO2 22 - 32 mmol/L 27 30 28   Calcium 8.9 - 10.3 mg/dL 9.6 9.2 9.1  Total Protein 6.5 - 8.1 g/dL 7.1 6.4(L) 6.6  Total Bilirubin 0.3 - 1.2 mg/dL 0.4 0.3 0.8  Alkaline Phos 38 - 126 U/L 64 39 68  AST 15 - 41 U/L 19 17 18   ALT 0 - 44 U/L 19 15 22       RADIOGRAPHIC STUDIES: I have personally reviewed the radiological images as listed and agreed with the findings in the report. No results found.   ASSESSMENT & PLAN:  Melinda [name] is a 60 y.o. female with    1.Recurrent squamous cell carcinoma the anus, pT1N0M0 -She initially presented to Dr Theophilus Bones hemorrhoidectomyand Dema Severin 2/5/21withinvasive moderately differentiated squamous cell carcinoma, 1.4 cm,pT1, pNx. -She had local  recurrence andunderwent surgical excision under the care of Dr. Dema Severin on 01/11/20. Surgical pathwas consistent with squamous cell carcinoma of the anus.01/28/11 staging PETshowed nonode ordistant metastasis. -She was only able to complete 5 out of the planned 6 weeks of the standard concurrent chemoRT with Mitomycin and5FU on due to poor tolerance of local skin irritation, N/V/D and had prolonged recovery -Her post treatment PET from 06/04/20 showed excellent response to treatment, mild residual uptake in her rectal is likely related to treatment. Her recent anoscope was negative for residual disease. -She is clinically doing well. Her BM have been constipated which is managed well with colase. Labs reviewed, CBC and CMP WNL except WBC 3.2, Hg 11.8. Physical and rectal exam unremarkable today  -Continue surveillance. F/u in 4 months   2.Iron deficiency anemia -Etiology unclear as to her iron deficiency. S/Physterectomy -Currently managed by PCP, Dr. Joylene Draft. She receives iron infusions with Feraheme PRN, last in9/3/21 -Anemia is mild, but improved off chemo. Iron panel still pending.   4. Diabetes Mellitis, Health Maintenance  -Continue to f/u with PCP for management -I encouraged her to do yearly mammograms and continue Pap smear if she has cervix s/p hysterectomy.   5. Neutropenia, Thrombocytopenia -Secondary to chemo, overall mildand improved -ANC and platelets have normalized off chemo. She still has mildly low WBC.  -She has had her COVID booster. If interested she can proceed with second COVID booster.    PLAN: -She is clinically doing well, continue cancer surveillance  -She will see Dr. Dema Severin next month  -lab and F/u in 4 months    No problem-specific Assessment & Plan notes found for this encounter.   No orders of the defined types were placed in this encounter.  All questions were answered. The patient knows to call the clinic with any problems, questions or  concerns. No barriers to learning was detected. The total time spent in the appointment was 25 minutes.     Truitt Merle, MD 10/08/2020   I, Joslyn Devon, am acting as scribe for Truitt Merle, MD.   I have reviewed the above documentation for accuracy and completeness, and I agree with the above.

## 2020-10-08 ENCOUNTER — Encounter: Payer: Self-pay | Admitting: Hematology

## 2020-10-08 ENCOUNTER — Inpatient Hospital Stay: Payer: BC Managed Care – PPO | Attending: Hematology

## 2020-10-08 ENCOUNTER — Other Ambulatory Visit: Payer: Self-pay

## 2020-10-08 ENCOUNTER — Inpatient Hospital Stay (HOSPITAL_BASED_OUTPATIENT_CLINIC_OR_DEPARTMENT_OTHER): Payer: BC Managed Care – PPO | Admitting: Hematology

## 2020-10-08 ENCOUNTER — Telehealth: Payer: Self-pay | Admitting: Hematology

## 2020-10-08 VITALS — BP 122/72 | HR 80 | Temp 97.6°F | Resp 18 | Ht 66.0 in | Wt 154.3 lb

## 2020-10-08 DIAGNOSIS — E119 Type 2 diabetes mellitus without complications: Secondary | ICD-10-CM | POA: Insufficient documentation

## 2020-10-08 DIAGNOSIS — C210 Malignant neoplasm of anus, unspecified: Secondary | ICD-10-CM | POA: Diagnosis not present

## 2020-10-08 DIAGNOSIS — Z79899 Other long term (current) drug therapy: Secondary | ICD-10-CM | POA: Insufficient documentation

## 2020-10-08 DIAGNOSIS — I700 Atherosclerosis of aorta: Secondary | ICD-10-CM | POA: Insufficient documentation

## 2020-10-08 DIAGNOSIS — D509 Iron deficiency anemia, unspecified: Secondary | ICD-10-CM | POA: Diagnosis not present

## 2020-10-08 DIAGNOSIS — D6959 Other secondary thrombocytopenia: Secondary | ICD-10-CM | POA: Insufficient documentation

## 2020-10-08 DIAGNOSIS — D701 Agranulocytosis secondary to cancer chemotherapy: Secondary | ICD-10-CM | POA: Diagnosis not present

## 2020-10-08 LAB — CBC WITH DIFFERENTIAL (CANCER CENTER ONLY)
Abs Immature Granulocytes: 0.01 10*3/uL (ref 0.00–0.07)
Basophils Absolute: 0 10*3/uL (ref 0.0–0.1)
Basophils Relative: 1 %
Eosinophils Absolute: 0.1 10*3/uL (ref 0.0–0.5)
Eosinophils Relative: 2 %
HCT: 36.3 % (ref 36.0–46.0)
Hemoglobin: 11.8 g/dL — ABNORMAL LOW (ref 12.0–15.0)
Immature Granulocytes: 0 %
Lymphocytes Relative: 28 %
Lymphs Abs: 0.9 10*3/uL (ref 0.7–4.0)
MCH: 27.3 pg (ref 26.0–34.0)
MCHC: 32.5 g/dL (ref 30.0–36.0)
MCV: 84 fL (ref 80.0–100.0)
Monocytes Absolute: 0.2 10*3/uL (ref 0.1–1.0)
Monocytes Relative: 7 %
Neutro Abs: 2 10*3/uL (ref 1.7–7.7)
Neutrophils Relative %: 62 %
Platelet Count: 184 10*3/uL (ref 150–400)
RBC: 4.32 MIL/uL (ref 3.87–5.11)
RDW: 12.9 % (ref 11.5–15.5)
WBC Count: 3.2 10*3/uL — ABNORMAL LOW (ref 4.0–10.5)
nRBC: 0 % (ref 0.0–0.2)

## 2020-10-08 LAB — CMP (CANCER CENTER ONLY)
ALT: 19 U/L (ref 0–44)
AST: 19 U/L (ref 15–41)
Albumin: 4.1 g/dL (ref 3.5–5.0)
Alkaline Phosphatase: 64 U/L (ref 38–126)
Anion gap: 7 (ref 5–15)
BUN: 25 mg/dL — ABNORMAL HIGH (ref 8–23)
CO2: 27 mmol/L (ref 22–32)
Calcium: 9.6 mg/dL (ref 8.9–10.3)
Chloride: 107 mmol/L (ref 98–111)
Creatinine: 0.97 mg/dL (ref 0.44–1.00)
GFR, Estimated: >60 mL/min (ref >60)
Glucose, Bld: 164 mg/dL — ABNORMAL HIGH (ref 70–99)
Potassium: 4.1 mmol/L (ref 3.5–5.1)
Sodium: 141 mmol/L (ref 135–145)
Total Bilirubin: 0.4 mg/dL (ref 0.3–1.2)
Total Protein: 7.1 g/dL (ref 6.5–8.1)

## 2020-10-08 LAB — FERRITIN: Ferritin: 15 ng/mL (ref 11–307)

## 2020-10-08 LAB — IRON AND TIBC
Iron: 68 ug/dL (ref 28–170)
Saturation Ratios: 15 % (ref 10.4–31.8)
TIBC: 460 ug/dL — ABNORMAL HIGH (ref 250–450)
UIBC: 392 ug/dL

## 2020-10-08 NOTE — Telephone Encounter (Signed)
Scheduled follow-up appointment per 5/4 los. Patient is aware. ?

## 2020-10-09 ENCOUNTER — Telehealth: Payer: Self-pay

## 2020-10-09 NOTE — Telephone Encounter (Signed)
-----   Message from Truitt Merle, MD sent at 10/08/2020  7:57 PM EDT ----- Please let pt know her iron level is slightly low, I recommend prenatal vit or oral iron once a day for 3 months.  Truitt Merle  10/08/2020

## 2020-10-09 NOTE — Telephone Encounter (Signed)
Called left message for pt concerning most recent labs per provider pt advised to begin either a prenatal vitamin 1 tab daily OR a iron tab 325mg  1 tab QD

## 2020-10-27 DIAGNOSIS — Z87440 Personal history of urinary (tract) infections: Secondary | ICD-10-CM | POA: Diagnosis not present

## 2020-10-27 DIAGNOSIS — N200 Calculus of kidney: Secondary | ICD-10-CM | POA: Diagnosis not present

## 2020-11-17 DIAGNOSIS — C210 Malignant neoplasm of anus, unspecified: Secondary | ICD-10-CM | POA: Diagnosis not present

## 2020-11-26 DIAGNOSIS — F3341 Major depressive disorder, recurrent, in partial remission: Secondary | ICD-10-CM | POA: Diagnosis not present

## 2020-12-02 ENCOUNTER — Ambulatory Visit (HOSPITAL_COMMUNITY)
Admission: RE | Admit: 2020-12-02 | Discharge: 2020-12-02 | Disposition: A | Discharge status: Home / Self Care | Payer: BC Managed Care – PPO | Source: Ambulatory Visit | Attending: Internal Medicine | Admitting: Internal Medicine

## 2020-12-02 ENCOUNTER — Other Ambulatory Visit (HOSPITAL_COMMUNITY): Payer: Self-pay | Admitting: Family Medicine

## 2020-12-02 ENCOUNTER — Other Ambulatory Visit: Payer: Self-pay

## 2020-12-02 DIAGNOSIS — M79662 Pain in left lower leg: Secondary | ICD-10-CM | POA: Diagnosis not present

## 2020-12-02 DIAGNOSIS — M79605 Pain in left leg: Secondary | ICD-10-CM | POA: Diagnosis not present

## 2020-12-02 DIAGNOSIS — M7989 Other specified soft tissue disorders: Secondary | ICD-10-CM

## 2020-12-03 ENCOUNTER — Encounter (HOSPITAL_COMMUNITY): Payer: BC Managed Care – PPO

## 2020-12-20 ENCOUNTER — Emergency Department (HOSPITAL_COMMUNITY)
Admission: EM | Admit: 2020-12-20 | Discharge: 2020-12-20 | Disposition: A | Discharge status: Home / Self Care | Payer: BC Managed Care – PPO | Attending: Emergency Medicine | Admitting: Emergency Medicine

## 2020-12-20 ENCOUNTER — Other Ambulatory Visit: Payer: Self-pay

## 2020-12-20 ENCOUNTER — Emergency Department (HOSPITAL_COMMUNITY): Payer: BC Managed Care – PPO

## 2020-12-20 ENCOUNTER — Encounter (HOSPITAL_COMMUNITY): Payer: Self-pay

## 2020-12-20 DIAGNOSIS — S82832A Other fracture of upper and lower end of left fibula, initial encounter for closed fracture: Secondary | ICD-10-CM

## 2020-12-20 DIAGNOSIS — E119 Type 2 diabetes mellitus without complications: Secondary | ICD-10-CM | POA: Insufficient documentation

## 2020-12-20 DIAGNOSIS — Z87891 Personal history of nicotine dependence: Secondary | ICD-10-CM | POA: Insufficient documentation

## 2020-12-20 DIAGNOSIS — Z79899 Other long term (current) drug therapy: Secondary | ICD-10-CM | POA: Diagnosis not present

## 2020-12-20 DIAGNOSIS — W108XXA Fall (on) (from) other stairs and steps, initial encounter: Secondary | ICD-10-CM | POA: Diagnosis not present

## 2020-12-20 DIAGNOSIS — Z85048 Personal history of other malignant neoplasm of rectum, rectosigmoid junction, and anus: Secondary | ICD-10-CM | POA: Diagnosis not present

## 2020-12-20 DIAGNOSIS — I10 Essential (primary) hypertension: Secondary | ICD-10-CM | POA: Diagnosis not present

## 2020-12-20 DIAGNOSIS — S99912A Unspecified injury of left ankle, initial encounter: Secondary | ICD-10-CM | POA: Insufficient documentation

## 2020-12-20 DIAGNOSIS — M7989 Other specified soft tissue disorders: Secondary | ICD-10-CM | POA: Diagnosis not present

## 2020-12-20 MED ORDER — OXYCODONE-ACETAMINOPHEN 5-325 MG PO TABS
1.0000 | ORAL_TABLET | Freq: Four times a day (QID) | ORAL | 0 refills | Status: DC | PRN
Start: 2020-12-20 — End: 2021-06-12

## 2020-12-20 NOTE — ED Provider Notes (Signed)
[facility] DEPT Provider Note   CSN: [id number] Arrival date & time: 12/20/20  1229     History No chief complaint on file.   Melinda [name] is a 60 y.o. female.  60 year old female presents with left ankle injury after mechanical fall just prior to arrival.  Complains of sharp pain to her left lateral malleolus that is worse with movement.  She cannot bear weight.  Denies any associated left knee or left hip discomfort.  No numbness or tingling to her left foot.  No treatment use prior to arrival      Past Medical History:  Diagnosis Date   anal ca dx'd 07/2019   Diabetes mellitus type 2, controlled ([place])    Dyslipidemia    untreated   GERD (gastroesophageal reflux disease)    Hemorrhoids    Microcytic anemia 06/22/2012   Nephrolith    Palpitation    positive tilt table test. Prozac and Inderal treatment effective.    Patient Active Problem List   Diagnosis Date Noted   Diabetes mellitus type 2, controlled, without complications ([place]) 96/29/5284   Pseudomonas aeruginosa infection 04/08/2020   Thrombocytopenia ([place]) 04/08/2020   UTI (urinary tract infection) 04/05/2020   Recurrent anal squamous cell carcinoma ([place]) 04/02/2020   Abdominal pain 03/26/2020   Ileitis 03/26/2020   Hydroureteronephrosis 03/26/2020   Acute lower UTI 03/26/2020   Intractable nausea and vomiting 03/13/2020   Squamous cell cancer, anus ([place]) 01/16/2020   Diabetes mellitus ([place]) 01/16/2020   Goals of care, counseling/discussion 01/16/2020   Chest pain 06/26/2012   Iron deficiency anemia 06/22/2012   Near syncope 06/20/2012   Heart palpitations 04/28/2011   Hypertension 04/28/2011   Dyslipidemia 04/28/2011    Past Surgical History:  Procedure Laterality Date   APPENDECTOMY     BREAST REDUCTION SURGERY     CARDIAC CATHETERIZATION  2002   normal LV function and no significant coronary obstruction   CATARACT EXTRACTION, BILATERAL Bilateral november 2020 and  dcember 2020   CYST EXCISION N/A 01/11/2020   Procedure: EXCISION OF ANAL CANAL MASS;  Surgeon: Ileana Roup, MD;  [facility];  Service: General;  Laterality: N/A;   CYSTOSCOPY/URETEROSCOPY/HOLMIUM LASER/STENT PLACEMENT Right 03/26/2020   Procedure: CYSTOSCOPY/URETEROSCOPY/HOLMIUM LASER/STENT PLACEMENT;  Surgeon: [name], MD;  [facility];  Service: Urology;  Laterality: Right;   ESOPHAGEAL MANOMETRY N/A 04/04/2017   Procedure: ESOPHAGEAL MANOMETRY (EM);  Surgeon: Laurence Spates, MD;  [facility];  Service: Endoscopy;  Laterality: N/A;   HEMORRHOID SURGERY N/A 07/13/2019   Procedure: HEMORRHOIDECTOMY;  Surgeon: Johnathan Hausen, MD;  Location: [facility];  Service: General;  Laterality: N/A;   KIDNEY SURGERY     kidney stones    PARTIAL HYSTERECTOMY     RECTAL EXAM UNDER ANESTHESIA N/A 01/11/2020   Procedure: RECTAL EXAM UNDER ANESTHESIA;  Surgeon: Ileana Roup, MD;  [facility];  Service: General;  Laterality: N/A;   TONSILLECTOMY       OB History   No obstetric history on file.     Family History  Problem Relation Age of Onset   Heart disease Mother    Liver disease Mother    Heart disease Father    Heart attack Father    Cancer Father        prostate CA   Cancer Maternal Grandmother 9       ovarian cancer   Coronary artery disease Other        Fx of  Heart attack Other    Diabetes Other     Social History   Tobacco Use   Smoking status: Former    Types: E-cigarettes    Quit date: 07/08/2017    Years since quitting: 3.4   Smokeless tobacco: Never   Tobacco comments:    uses electronic  Vaping Use   Vaping Use: Former   Quit date: 07/03/2016  Substance Use Topics   Alcohol use: No   Drug use: No    Home Medications Prior to Admission medications   Medication Sig Start Date End Date Taking? Authorizing Provider  clorazepate (TRANXENE) 7.5 MG tablet Take 7.5 mg by mouth 2 (two) times daily. 02/12/18    [provider]  CRESTOR 5 MG tablet Take 5 mg by mouth every evening.  08/10/14   [provider]  docusate sodium (COLACE) 100 MG capsule Take 1 capsule (100 mg total) by mouth 2 (two) times daily. 04/09/20   Donne Hazel, MD  esomeprazole (NEXIUM) 40 MG capsule Take 40 mg by mouth daily at 12 noon.    [provider]  feeding supplement (ENSURE ENLIVE / ENSURE PLUS) LIQD Take 237 mLs by mouth daily. 03/21/20   Hongalgi, Lenis Dickinson, MD  FLUoxetine (PROZAC) 20 MG capsule Take 20 mg by mouth every morning. 10/23/19   [provider]  methenamine (HIPREX) 1 g tablet Take 1 g by mouth daily.     [provider]  polyethylene glycol (MIRALAX / GLYCOLAX) 17 g packet Take 17 g by mouth daily. 04/10/20   Donne Hazel, MD  propranolol (INDERAL) 10 MG tablet Take 1 tablet (10 mg total) by mouth daily. 07/28/20   Fay Records, MD    Allergies    Cephalexin, Amoxapine and related, Amoxicillin-pot clavulanate, Atorvastatin, Cefaclor, Ciprofloxacin, Codeine, Doxycycline calcium, Fenofibrate micronized, Fish oil, Flagyl [metronidazole hcl], Levofloxacin in d5w, Metronidazole, Ofloxacin, Prednisolone, Promethazine hcl, Tape, Tetracycline hcl, Erythromycin, and Nitrofurantoin  Review of Systems   Review of Systems  All other systems reviewed and are negative.  Physical Exam Updated Vital Signs BP 124/72 (BP Location: Left Arm)   Pulse 77   Temp 98.3 F (36.8 C) (Oral)   Resp 18   Ht 1.676 m (5\' 6" )   Wt 69.4 kg   SpO2 98%   BMI 24.69 kg/m   Physical Exam Vitals and nursing note reviewed.  Constitutional:      Appearance: She is well-developed. She is not toxic-appearing.  HENT:     Head: Normocephalic and atraumatic.  Eyes:     Conjunctiva/sclera: Conjunctivae normal.     Pupils: Pupils are equal, round, and reactive to light.  Cardiovascular:     Rate and Rhythm: Normal rate.  Pulmonary:     Effort: Pulmonary effort is normal.   Musculoskeletal:     Cervical back: Normal range of motion.     Left ankle: Swelling present. Tenderness present. Decreased range of motion.  Skin:    General: Skin is warm and dry.  Neurological:     Mental Status: She is alert and oriented to person, place, and time.    ED Results / Procedures / Treatments   Labs (all labs ordered are listed, but only abnormal results are displayed) Labs Reviewed - No data to display  EKG None  Radiology No results found.  Procedures Procedures   Medications Ordered in ED Medications - No data to display  ED Course  I have reviewed the triage vital signs and the  nursing notes.  Pertinent labs & imaging results that were available during my care of the patient were reviewed by me and considered in my medical decision making (see chart for details).    MDM Rules/Calculators/A&P                          X-ray findings are noted and patient placed in splint and given crutches.  Will give referral to orthopedics on-call Final Clinical Impression(s) / ED Diagnoses Final diagnoses:  None    Rx / DC Orders ED Discharge Orders     None        Lacretia Leigh, MD 12/20/20 1349

## 2020-12-20 NOTE — ED Triage Notes (Signed)
Pt reports falling aboout 45 mins ago and now has pain and swelling to the left ankle.Pt missed a step

## 2020-12-22 DIAGNOSIS — S92355A Nondisplaced fracture of fifth metatarsal bone, left foot, initial encounter for closed fracture: Secondary | ICD-10-CM | POA: Diagnosis not present

## 2021-01-02 DIAGNOSIS — S92355A Nondisplaced fracture of fifth metatarsal bone, left foot, initial encounter for closed fracture: Secondary | ICD-10-CM | POA: Diagnosis not present

## 2021-01-07 DIAGNOSIS — M5137 Other intervertebral disc degeneration, lumbosacral region: Secondary | ICD-10-CM | POA: Diagnosis not present

## 2021-01-07 DIAGNOSIS — M545 Low back pain, unspecified: Secondary | ICD-10-CM | POA: Diagnosis not present

## 2021-01-07 DIAGNOSIS — M9903 Segmental and somatic dysfunction of lumbar region: Secondary | ICD-10-CM | POA: Diagnosis not present

## 2021-01-07 DIAGNOSIS — M5136 Other intervertebral disc degeneration, lumbar region: Secondary | ICD-10-CM | POA: Diagnosis not present

## 2021-01-08 DIAGNOSIS — M5136 Other intervertebral disc degeneration, lumbar region: Secondary | ICD-10-CM | POA: Diagnosis not present

## 2021-01-08 DIAGNOSIS — M9903 Segmental and somatic dysfunction of lumbar region: Secondary | ICD-10-CM | POA: Diagnosis not present

## 2021-01-08 DIAGNOSIS — M5137 Other intervertebral disc degeneration, lumbosacral region: Secondary | ICD-10-CM | POA: Diagnosis not present

## 2021-01-08 DIAGNOSIS — M545 Low back pain, unspecified: Secondary | ICD-10-CM | POA: Diagnosis not present

## 2021-01-12 DIAGNOSIS — M9903 Segmental and somatic dysfunction of lumbar region: Secondary | ICD-10-CM | POA: Diagnosis not present

## 2021-01-12 DIAGNOSIS — M5137 Other intervertebral disc degeneration, lumbosacral region: Secondary | ICD-10-CM | POA: Diagnosis not present

## 2021-01-12 DIAGNOSIS — M545 Low back pain, unspecified: Secondary | ICD-10-CM | POA: Diagnosis not present

## 2021-01-12 DIAGNOSIS — M5136 Other intervertebral disc degeneration, lumbar region: Secondary | ICD-10-CM | POA: Diagnosis not present

## 2021-01-14 DIAGNOSIS — M5137 Other intervertebral disc degeneration, lumbosacral region: Secondary | ICD-10-CM | POA: Diagnosis not present

## 2021-01-14 DIAGNOSIS — M5136 Other intervertebral disc degeneration, lumbar region: Secondary | ICD-10-CM | POA: Diagnosis not present

## 2021-01-14 DIAGNOSIS — M545 Low back pain, unspecified: Secondary | ICD-10-CM | POA: Diagnosis not present

## 2021-01-14 DIAGNOSIS — M9903 Segmental and somatic dysfunction of lumbar region: Secondary | ICD-10-CM | POA: Diagnosis not present

## 2021-01-16 DIAGNOSIS — N3000 Acute cystitis without hematuria: Secondary | ICD-10-CM | POA: Diagnosis not present

## 2021-01-16 DIAGNOSIS — R300 Dysuria: Secondary | ICD-10-CM | POA: Diagnosis not present

## 2021-01-19 DIAGNOSIS — M5136 Other intervertebral disc degeneration, lumbar region: Secondary | ICD-10-CM | POA: Diagnosis not present

## 2021-01-19 DIAGNOSIS — M545 Low back pain, unspecified: Secondary | ICD-10-CM | POA: Diagnosis not present

## 2021-01-19 DIAGNOSIS — M5137 Other intervertebral disc degeneration, lumbosacral region: Secondary | ICD-10-CM | POA: Diagnosis not present

## 2021-01-19 DIAGNOSIS — M9903 Segmental and somatic dysfunction of lumbar region: Secondary | ICD-10-CM | POA: Diagnosis not present

## 2021-01-21 DIAGNOSIS — M545 Low back pain, unspecified: Secondary | ICD-10-CM | POA: Diagnosis not present

## 2021-01-21 DIAGNOSIS — M5137 Other intervertebral disc degeneration, lumbosacral region: Secondary | ICD-10-CM | POA: Diagnosis not present

## 2021-01-21 DIAGNOSIS — M5136 Other intervertebral disc degeneration, lumbar region: Secondary | ICD-10-CM | POA: Diagnosis not present

## 2021-01-21 DIAGNOSIS — M9903 Segmental and somatic dysfunction of lumbar region: Secondary | ICD-10-CM | POA: Diagnosis not present

## 2021-01-26 DIAGNOSIS — M5136 Other intervertebral disc degeneration, lumbar region: Secondary | ICD-10-CM | POA: Diagnosis not present

## 2021-01-26 DIAGNOSIS — M5137 Other intervertebral disc degeneration, lumbosacral region: Secondary | ICD-10-CM | POA: Diagnosis not present

## 2021-01-26 DIAGNOSIS — M9903 Segmental and somatic dysfunction of lumbar region: Secondary | ICD-10-CM | POA: Diagnosis not present

## 2021-01-26 DIAGNOSIS — M545 Low back pain, unspecified: Secondary | ICD-10-CM | POA: Diagnosis not present

## 2021-01-27 DIAGNOSIS — S92355A Nondisplaced fracture of fifth metatarsal bone, left foot, initial encounter for closed fracture: Secondary | ICD-10-CM | POA: Diagnosis not present

## 2021-01-28 DIAGNOSIS — Z85048 Personal history of other malignant neoplasm of rectum, rectosigmoid junction, and anus: Secondary | ICD-10-CM | POA: Diagnosis not present

## 2021-01-28 DIAGNOSIS — K641 Second degree hemorrhoids: Secondary | ICD-10-CM | POA: Diagnosis not present

## 2021-01-28 DIAGNOSIS — K625 Hemorrhage of anus and rectum: Secondary | ICD-10-CM | POA: Diagnosis not present

## 2021-01-29 DIAGNOSIS — M5136 Other intervertebral disc degeneration, lumbar region: Secondary | ICD-10-CM | POA: Diagnosis not present

## 2021-01-29 DIAGNOSIS — M5137 Other intervertebral disc degeneration, lumbosacral region: Secondary | ICD-10-CM | POA: Diagnosis not present

## 2021-01-29 DIAGNOSIS — M545 Low back pain, unspecified: Secondary | ICD-10-CM | POA: Diagnosis not present

## 2021-01-29 DIAGNOSIS — M9903 Segmental and somatic dysfunction of lumbar region: Secondary | ICD-10-CM | POA: Diagnosis not present

## 2021-02-02 ENCOUNTER — Inpatient Hospital Stay (HOSPITAL_BASED_OUTPATIENT_CLINIC_OR_DEPARTMENT_OTHER): Payer: BC Managed Care – PPO | Admitting: Hematology

## 2021-02-02 ENCOUNTER — Inpatient Hospital Stay: Payer: BC Managed Care – PPO | Attending: Hematology

## 2021-02-02 ENCOUNTER — Other Ambulatory Visit: Payer: Self-pay

## 2021-02-02 VITALS — BP 119/75 | HR 80 | Temp 98.0°F | Resp 16 | Ht 66.0 in | Wt 158.4 lb

## 2021-02-02 DIAGNOSIS — Z79899 Other long term (current) drug therapy: Secondary | ICD-10-CM | POA: Insufficient documentation

## 2021-02-02 DIAGNOSIS — D6959 Other secondary thrombocytopenia: Secondary | ICD-10-CM | POA: Insufficient documentation

## 2021-02-02 DIAGNOSIS — C210 Malignant neoplasm of anus, unspecified: Secondary | ICD-10-CM | POA: Diagnosis not present

## 2021-02-02 DIAGNOSIS — I700 Atherosclerosis of aorta: Secondary | ICD-10-CM | POA: Insufficient documentation

## 2021-02-02 DIAGNOSIS — D509 Iron deficiency anemia, unspecified: Secondary | ICD-10-CM | POA: Insufficient documentation

## 2021-02-02 DIAGNOSIS — D701 Agranulocytosis secondary to cancer chemotherapy: Secondary | ICD-10-CM | POA: Insufficient documentation

## 2021-02-02 DIAGNOSIS — E119 Type 2 diabetes mellitus without complications: Secondary | ICD-10-CM | POA: Insufficient documentation

## 2021-02-02 LAB — CBC WITH DIFFERENTIAL (CANCER CENTER ONLY)
Abs Immature Granulocytes: 0.01 10*3/uL (ref 0.00–0.07)
Basophils Absolute: 0 10*3/uL (ref 0.0–0.1)
Basophils Relative: 1 %
Eosinophils Absolute: 0.1 10*3/uL (ref 0.0–0.5)
Eosinophils Relative: 3 %
HCT: 34.9 % — ABNORMAL LOW (ref 36.0–46.0)
Hemoglobin: 11.1 g/dL — ABNORMAL LOW (ref 12.0–15.0)
Immature Granulocytes: 0 %
Lymphocytes Relative: 23 %
Lymphs Abs: 0.8 10*3/uL (ref 0.7–4.0)
MCH: 25.5 pg — ABNORMAL LOW (ref 26.0–34.0)
MCHC: 31.8 g/dL (ref 30.0–36.0)
MCV: 80.2 fL (ref 80.0–100.0)
Monocytes Absolute: 0.3 10*3/uL (ref 0.1–1.0)
Monocytes Relative: 7 %
Neutro Abs: 2.5 10*3/uL (ref 1.7–7.7)
Neutrophils Relative %: 66 %
Platelet Count: 218 10*3/uL (ref 150–400)
RBC: 4.35 MIL/uL (ref 3.87–5.11)
RDW: 13.2 % (ref 11.5–15.5)
WBC Count: 3.7 10*3/uL — ABNORMAL LOW (ref 4.0–10.5)
nRBC: 0 % (ref 0.0–0.2)

## 2021-02-02 LAB — CMP (CANCER CENTER ONLY)
ALT: 21 U/L (ref 0–44)
AST: 17 U/L (ref 15–41)
Albumin: 3.9 g/dL (ref 3.5–5.0)
Alkaline Phosphatase: 71 U/L (ref 38–126)
Anion gap: 9 (ref 5–15)
BUN: 21 mg/dL (ref 8–23)
CO2: 26 mmol/L (ref 22–32)
Calcium: 9.5 mg/dL (ref 8.9–10.3)
Chloride: 107 mmol/L (ref 98–111)
Creatinine: 1.03 mg/dL — ABNORMAL HIGH (ref 0.44–1.00)
GFR, Estimated: >60 mL/min (ref >60)
Glucose, Bld: 200 mg/dL — ABNORMAL HIGH (ref 70–99)
Potassium: 4.1 mmol/L (ref 3.5–5.1)
Sodium: 142 mmol/L (ref 135–145)
Total Bilirubin: 0.4 mg/dL (ref 0.3–1.2)
Total Protein: 7 g/dL (ref 6.5–8.1)

## 2021-02-02 LAB — IRON AND TIBC
Iron: 47 ug/dL (ref 41–142)
Saturation Ratios: 11 % — ABNORMAL LOW (ref 21–57)
TIBC: 436 ug/dL (ref 236–444)
UIBC: 389 ug/dL — ABNORMAL HIGH (ref 120–384)

## 2021-02-02 LAB — FERRITIN: Ferritin: 5 ng/mL — ABNORMAL LOW (ref 11–307)

## 2021-02-02 NOTE — Progress Notes (Signed)
[name]   [phone] [phone]   Clinic Follow up Note   Patient Care Team: Crist Infante, MD as PCP - General (Internal Medicine) Fay Records, MD as PCP - Cardiology (Cardiology) Nobie Putnam, MD (Hematology and Oncology) Heilingoetter, Tobe Sos, PA-C as Physician Assistant (Oncology) Truitt Merle, MD as Consulting Physician (Oncology) Jonnie Finner, RN (Inactive) as Oncology Nurse Navigator Ileana Roup, MD as Consulting Physician (General Surgery)  Date of Service:  02/02/2021  CHIEF COMPLAINT: f/u of squamous cell carcinoma of anus  CURRENT THERAPY:  Surveillance  ASSESSMENT & PLAN:  Melinda [name] is a 60 y.o. female with   1. Recurrent squamous cell carcinoma the anus, pT1N0M0  -She initially presented to Dr Hassell Done for a hemorrhoidectomy and EUA on 07/13/19 with invasive moderately differentiated squamous cell carcinoma, 1.4 cm, pT1, pNx. -She had local recurrence and underwent surgical excision under the care of Dr. Dema Severin on 01/11/20. Surgical path was consistent with squamous cell carcinoma of the anus. 01/28/20 staging PET showed no node or distant metastasis. -She was only able to complete 5 out of the planned 6 weeks of the standard concurrent chemoRT with Mitomycin and 5FU on due to poor tolerance of local skin irritation, N/V/D and had prolonged recovery -Her post treatment PET from 06/04/20 showed excellent response to treatment, mild residual uptake in her rectal is likely related to treatment.  -Her most recent anoscope from 01/28/21 was negative, aside from hemorrhoids. She will f/u with Dr. Dema Severin in 03/2021. -plan for restaging CT CAP in late 05/2021, a year from prior. -She is clinically doing well. Labs reviewed, overall improved. Continue surveillance. F/u in 4 months     2. Iron deficiency anemia -Etiology unclear as to her iron deficiency. S/P hysterectomy -Currently managed by PCP, Dr. Joylene Draft. She received iron  infusions with Feraheme PRN, last in 02/08/20 -she continues on oral iron -Anemia is mild, but improved off chemo. Iron panel still pending.    4. DM, Health Maintenance  -Continue to f/u with PCP for management -I encouraged her to do yearly mammograms and continue Pap smear if she has cervix s/p hysterectomy.  -she notes she is due for repeat DEXA later this year.   5. Neutropenia, Thrombocytopenia  -Secondary to chemo, overall mild and improved -ANC and platelets have normalized off chemo. She still has mildly low WBC but overall improved.  -She has had her COVID booster. If interested she can proceed with second COVID booster.      PLAN:  -I will call her with iron panel results -She will see Dr. Dema Severin in 03/2021 -lab and F/u in 4 months with restaging CT    No problem-specific Assessment & Plan notes found for this encounter.   SUMMARY OF ONCOLOGIC HISTORY: Oncology History Overview Note  Cancer Staging No matching staging information was found for the patient.    Squamous cell cancer, anus ([place])  07/13/2019 Procedure    EUA, Excision of posterior internal/external hemorrhoid under the care of Dr. Hassell Done    07/13/2019 Pathology Results   - Invasive moderately differentiated squamous cell carcinoma, 1.4 cm.  See comment  - Carcinoma invades for depth of 0.4 cm  - Deep resection margin is negative for carcinoma (0.2 cm)  - Lateral mucosal margin is positive for high-grade dysplasia  - No evidence of lymphovascular perineural invasion  Procedure: Local excision  Tumor Site: Anal canal  Tumor Size: 1.4 cm  Histologic Type: Invasive squamous cell carcinoma  Histologic Grade: G2: Moderately differentiated  Tumor Extension: Carcinoma invades superficial anal sphincter muscle  Margins: Uninvolved by tumor  Treatment Effect: N/A  Regional Lymph Nodes: No lymph nodes submitted or found  Pathologic Stage Classification (pTNM, AJCC 8th Edition):  pT1, pNx  Representative Tumor  Block: A1  Comment(s): Lateral mucosal margin is positive for high-grade squamous  dysplasia      07/13/2019 Initial Diagnosis   Squamous cell cancer, anus ([place])   09/04/2019 Imaging   CT scan Chest, Abdomen, and Pelvis   IMPRESSION: 1. No evidence of metastatic disease in the chest, abdomen or pelvis. 2. No discrete anorectal mass. 3. Chronic findings include: Punctate nonobstructing upper right renal stones and chronic right renal scarring. 4. Aortic Atherosclerosis (ICD10-I70.0).   01/10/2020 Pathology Results   A. ANAL LESION, POSTERIOR MIDLINE, EXCISION:  - Squamous cell carcinoma, moderately differentiated.  Verbally reported by Dr. Saralyn Pilar 1.5 cm, negative margins, depth <1 mm   01/11/2020 Procedure   1. Excision of anal canal lesion (posterior midline) under the care of Dr. Dema Severin     01/28/2020 PET scan   IMPRESSION: 1. Mild focal anal hypermetabolism without discrete mass correlate on the CT images, nonspecific, differential includes postsurgical change or residual anal tumor. 2. No hypermetabolic locoregional or distant metastatic disease. 3. Chronic findings include: Aortic Atherosclerosis (ICD10-I70.0). Diffuse hepatic steatosis. Nonobstructing right nephrolithiasis.     02/04/2020 - 03/07/2020 Radiation Therapy   concurrent chemoRT by Dr Lisbeth Renshaw with Mitomycin and 5FU starting 02/04/20-03/07/20. The last 4 session cancelled due to poor toleration.    02/04/2020 - 03/03/2020 Chemotherapy   Concurrent chemoRT with Mitomycin and 5FU on week 1 and 5 starting 02/04/20-03/03/20   03/13/2020 Imaging   CT AP W contrast  IMPRESSION: Diffuse wall thickening with inflammatory changes involving a long segment of distal ileum, cecum, and sigmoid colon likely represents enteritis/colitis. This could be due to infectious, or inflammatory nature, not thought to represent ischemic colitis.   Hepatic steatosis   Trace pericardial effusion   Small free fluid in the deep pelvis    06/04/2020 Imaging   PET IMPRESSION: 1. Diminished metabolic uptake about the rectum with presumed post radiation changes showing mild perirectal stranding and perianal stranding on today's exam. Continued correlation with direct visualization may be helpful to exclude any residual tumor in this location but diminished FDG uptake is encouraging and findings could certainly be seen in the setting of post treatment change. 2. No signs of metastatic disease to the neck, chest, abdomen or pelvis. 3. Diminished perienteric stranding about distal small bowel with persistent mild stranding and stool like material in distal small bowel likely related to enteritis and associated with mild but diminished FDG uptake. Attention on follow-up. 4. Resolution of RIGHT-sided hydronephrosis with signs of nephrolithiasis as before. 5. Mild asymmetry of metabolic activity along the RIGHT chest wall associated serrated S musculature adjacent to the tip of the scapula felt to represent asymmetric muscular activity. Correlate with any symptoms in this area and suggest attention on follow-up.   Aortic Atherosclerosis (ICD10-I70.0).      INTERVAL HISTORY:  Melinda [name] is here for a follow up of anal cancer. She was last seen by me on 10/08/20. She presents to the clinic alone. She recently saw Dr. Dema Severin on 01/28/21 because of recent constipation with rectal bleeding. She reports she was found to have hemorrhoids, which she didn't know she had. She reports she recently broke her left ankle. She notes she only recently stopped using her "  boot."   All other systems were reviewed with the patient and are negative.  MEDICAL HISTORY:  Past Medical History:  Diagnosis Date   anal ca dx'd 07/2019   Diabetes mellitus type 2, controlled ([employer])    Dyslipidemia    untreated   GERD (gastroesophageal reflux disease)    Hemorrhoids    Microcytic anemia 06/22/2012   Nephrolith    Palpitation    positive tilt  table test. Prozac and Inderal treatment effective.    SURGICAL HISTORY: Past Surgical History:  Procedure Laterality Date   APPENDECTOMY     BREAST REDUCTION SURGERY     CARDIAC CATHETERIZATION  2002   normal LV function and no significant coronary obstruction   CATARACT EXTRACTION, BILATERAL Bilateral november 2020 and dcember 2020   CYST EXCISION N/A 01/11/2020   Procedure: EXCISION OF ANAL CANAL MASS;  Surgeon: Ileana Roup, MD;  Location: [facility];  Service: General;  Laterality: N/A;   CYSTOSCOPY/URETEROSCOPY/HOLMIUM LASER/STENT PLACEMENT Right 03/26/2020   Procedure: CYSTOSCOPY/URETEROSCOPY/HOLMIUM LASER/STENT PLACEMENT;  Surgeon: [name], MD;  Location: [facility];  Service: Urology;  Laterality: Right;   ESOPHAGEAL MANOMETRY N/A 04/04/2017   Procedure: ESOPHAGEAL MANOMETRY (EM);  Surgeon: Laurence Spates, MD;  Location: [facility];  Service: Endoscopy;  Laterality: N/A;   HEMORRHOID SURGERY N/A 07/13/2019   Procedure: HEMORRHOIDECTOMY;  Surgeon: Johnathan Hausen, MD;  Location: [facility];  Service: General;  Laterality: N/A;   KIDNEY SURGERY     kidney stones    PARTIAL HYSTERECTOMY     RECTAL EXAM UNDER ANESTHESIA N/A 01/11/2020   Procedure: RECTAL EXAM UNDER ANESTHESIA;  Surgeon: Ileana Roup, MD;  Location: [facility];  Service: General;  Laterality: N/A;   TONSILLECTOMY      I have reviewed the social history and family history with the patient and they are unchanged from previous note.  ALLERGIES:  is allergic to cephalexin, amoxapine and related, amoxicillin-pot clavulanate, atorvastatin, cefaclor, ciprofloxacin, codeine, doxycycline calcium, fenofibrate micronized, fish oil, flagyl [metronidazole hcl], levofloxacin in d5w, metronidazole, ofloxacin, prednisolone, promethazine hcl, tape, tetracycline hcl, erythromycin, and nitrofurantoin.  MEDICATIONS:  Current Outpatient Medications  Medication Sig Dispense Refill   clorazepate (TRANXENE)  7.5 MG tablet Take 7.5 mg by mouth 2 (two) times daily.  1   CRESTOR 5 MG tablet Take 5 mg by mouth every evening.   1   docusate sodium (COLACE) 100 MG capsule Take 1 capsule (100 mg total) by mouth 2 (two) times daily. 30 capsule 0   esomeprazole (NEXIUM) 40 MG capsule Take 40 mg by mouth daily at 12 noon.     feeding supplement (ENSURE ENLIVE / ENSURE PLUS) LIQD Take 237 mLs by mouth daily. 237 mL 12   FLUoxetine (PROZAC) 20 MG capsule Take 20 mg by mouth every morning.     methenamine (HIPREX) 1 g tablet Take 1 g by mouth daily.      oxyCODONE-acetaminophen (PERCOCET/ROXICET) 5-325 MG tablet Take 1 tablet by mouth every 6 (six) hours as needed for severe pain. 15 tablet 0   polyethylene glycol (MIRALAX / GLYCOLAX) 17 g packet Take 17 g by mouth daily. 14 each 0   propranolol (INDERAL) 10 MG tablet Take 1 tablet (10 mg total) by mouth daily. 90 tablet 3   No current facility-administered medications for this visit.    PHYSICAL EXAMINATION: ECOG PERFORMANCE STATUS: 0 - Asymptomatic  Vitals:   02/02/21 1338  BP: 119/75  Pulse: 80  Resp: 16  Temp: 98 F (36.7  C)  SpO2: 96%   Wt Readings from Last 3 Encounters:  02/02/21 158 lb 6.4 oz (71.8 kg)  12/20/20 153 lb (69.4 kg)  10/08/20 154 lb 4.8 oz (70 kg)     GENERAL:alert, no distress and comfortable SKIN: skin color normal, no rashes or significant lesions EYES: normal, Conjunctiva are pink and non-injected, sclera clear  NEURO: alert & oriented x 3 with fluent speech  LABORATORY DATA:  I have reviewed the data as listed CBC Latest Ref Rng & Units 02/02/2021 10/08/2020 06/04/2020  WBC 4.0 - 10.5 K/uL 3.7(L) 3.2(L) 3.0(L)  Hemoglobin 12.0 - 15.0 g/dL 11.1(L) 11.8(L) 11.2(L)  Hematocrit 36.0 - 46.0 % 34.9(L) 36.3 34.7(L)  Platelets 150 - 400 K/uL 218 184 149(L)     CMP Latest Ref Rng & Units 02/02/2021 10/08/2020 06/04/2020  Glucose 70 - 99 mg/dL 200(H) 164(H) 124(H)  BUN 8 - 23 mg/dL 21 25(H) 18  Creatinine 0.44 - 1.00 mg/dL  1.03(H) 0.97 0.75  Sodium 135 - 145 mmol/L 142 141 144  Potassium 3.5 - 5.1 mmol/L 4.1 4.1 3.5  Chloride 98 - 111 mmol/L 107 107 109  CO2 22 - 32 mmol/L '26 27 30  '$ Calcium 8.9 - 10.3 mg/dL 9.5 9.6 9.2  Total Protein 6.5 - 8.1 g/dL 7.0 7.1 6.4(L)  Total Bilirubin 0.3 - 1.2 mg/dL 0.4 0.4 0.3  Alkaline Phos 38 - 126 U/L 71 64 39  AST 15 - 41 U/L '17 19 17  '$ ALT 0 - 44 U/L '21 19 15      '$ RADIOGRAPHIC STUDIES: I have personally reviewed the radiological images as listed and agreed with the findings in the report. No results found.    Orders Placed This Encounter  Procedures   CT CHEST ABDOMEN PELVIS W CONTRAST    Standing Status:   Future    Standing Expiration Date:   02/02/2022    Order Specific Question:   If indicated for the ordered procedure, I authorize the administration of contrast media per Radiology protocol    Answer:   Yes    Order Specific Question:   Preferred imaging location?    Answer:   [facility]    Order Specific Question:   Release to patient    Answer:   Immediate    Order Specific Question:   Is Oral Contrast requested for this exam?    Answer:   Yes, Per Radiology protocol    Order Specific Question:   Reason for Exam (SYMPTOM  OR DIAGNOSIS REQUIRED)    Answer:   rule out recurrence   All questions were answered. The patient knows to call the clinic with any problems, questions or concerns. No barriers to learning was detected. The total time spent in the appointment was 25 minutes.     Truitt Merle, MD 02/02/2021   I, Wilburn Mylar, am acting as scribe for Truitt Merle, MD.   I have reviewed the above documentation for accuracy and completeness, and I agree with the above.

## 2021-02-03 ENCOUNTER — Encounter: Payer: Self-pay | Admitting: Hematology

## 2021-02-03 DIAGNOSIS — M5137 Other intervertebral disc degeneration, lumbosacral region: Secondary | ICD-10-CM | POA: Diagnosis not present

## 2021-02-03 DIAGNOSIS — M545 Low back pain, unspecified: Secondary | ICD-10-CM | POA: Diagnosis not present

## 2021-02-03 DIAGNOSIS — M9903 Segmental and somatic dysfunction of lumbar region: Secondary | ICD-10-CM | POA: Diagnosis not present

## 2021-02-03 DIAGNOSIS — M5136 Other intervertebral disc degeneration, lumbar region: Secondary | ICD-10-CM | POA: Diagnosis not present

## 2021-02-05 DIAGNOSIS — M9903 Segmental and somatic dysfunction of lumbar region: Secondary | ICD-10-CM | POA: Diagnosis not present

## 2021-02-05 DIAGNOSIS — M5136 Other intervertebral disc degeneration, lumbar region: Secondary | ICD-10-CM | POA: Diagnosis not present

## 2021-02-05 DIAGNOSIS — M545 Low back pain, unspecified: Secondary | ICD-10-CM | POA: Diagnosis not present

## 2021-02-05 DIAGNOSIS — M5137 Other intervertebral disc degeneration, lumbosacral region: Secondary | ICD-10-CM | POA: Diagnosis not present

## 2021-02-06 ENCOUNTER — Other Ambulatory Visit: Payer: BC Managed Care – PPO

## 2021-02-06 ENCOUNTER — Ambulatory Visit: Payer: BC Managed Care – PPO | Admitting: Hematology

## 2021-02-06 ENCOUNTER — Telehealth: Payer: Self-pay | Admitting: Hematology

## 2021-02-06 NOTE — Telephone Encounter (Signed)
Scheduled appts per 8/29 sch msg. Pt aware.

## 2021-02-16 DIAGNOSIS — M5136 Other intervertebral disc degeneration, lumbar region: Secondary | ICD-10-CM | POA: Diagnosis not present

## 2021-02-16 DIAGNOSIS — M5137 Other intervertebral disc degeneration, lumbosacral region: Secondary | ICD-10-CM | POA: Diagnosis not present

## 2021-02-16 DIAGNOSIS — M545 Low back pain, unspecified: Secondary | ICD-10-CM | POA: Diagnosis not present

## 2021-02-16 DIAGNOSIS — M9903 Segmental and somatic dysfunction of lumbar region: Secondary | ICD-10-CM | POA: Diagnosis not present

## 2021-02-17 DIAGNOSIS — S92355A Nondisplaced fracture of fifth metatarsal bone, left foot, initial encounter for closed fracture: Secondary | ICD-10-CM | POA: Diagnosis not present

## 2021-02-18 DIAGNOSIS — M5137 Other intervertebral disc degeneration, lumbosacral region: Secondary | ICD-10-CM | POA: Diagnosis not present

## 2021-02-18 DIAGNOSIS — Z23 Encounter for immunization: Secondary | ICD-10-CM | POA: Diagnosis not present

## 2021-02-18 DIAGNOSIS — R600 Localized edema: Secondary | ICD-10-CM | POA: Diagnosis not present

## 2021-02-18 DIAGNOSIS — M545 Low back pain, unspecified: Secondary | ICD-10-CM | POA: Diagnosis not present

## 2021-02-18 DIAGNOSIS — M5136 Other intervertebral disc degeneration, lumbar region: Secondary | ICD-10-CM | POA: Diagnosis not present

## 2021-02-18 DIAGNOSIS — M9903 Segmental and somatic dysfunction of lumbar region: Secondary | ICD-10-CM | POA: Diagnosis not present

## 2021-02-18 DIAGNOSIS — E119 Type 2 diabetes mellitus without complications: Secondary | ICD-10-CM | POA: Diagnosis not present

## 2021-02-23 ENCOUNTER — Other Ambulatory Visit: Payer: Self-pay | Admitting: *Deleted

## 2021-02-23 ENCOUNTER — Telehealth: Payer: Self-pay | Admitting: Internal Medicine

## 2021-02-23 MED ORDER — PROPRANOLOL HCL 10 MG PO TABS
10.0000 mg | ORAL_TABLET | Freq: Every day | ORAL | 0 refills | Status: DC
Start: 2021-02-23 — End: 2021-08-19

## 2021-02-23 NOTE — Telephone Encounter (Signed)
*  STAT* If patient is at the pharmacy, call can be transferred to refill team.   1. Which medications need to be refilled? (please list name of each medication and dose if known) Propranolol  2. Which pharmacy/location (including street and city if local pharmacy) is medication to be [facility] [place]  3. Do they need a 30 day or 90 day supply? Enough until her appointment 06-25-20

## 2021-02-24 DIAGNOSIS — M9903 Segmental and somatic dysfunction of lumbar region: Secondary | ICD-10-CM | POA: Diagnosis not present

## 2021-02-24 DIAGNOSIS — M5136 Other intervertebral disc degeneration, lumbar region: Secondary | ICD-10-CM | POA: Diagnosis not present

## 2021-02-24 DIAGNOSIS — M545 Low back pain, unspecified: Secondary | ICD-10-CM | POA: Diagnosis not present

## 2021-02-24 DIAGNOSIS — M5137 Other intervertebral disc degeneration, lumbosacral region: Secondary | ICD-10-CM | POA: Diagnosis not present

## 2021-03-03 DIAGNOSIS — M5137 Other intervertebral disc degeneration, lumbosacral region: Secondary | ICD-10-CM | POA: Diagnosis not present

## 2021-03-03 DIAGNOSIS — M9903 Segmental and somatic dysfunction of lumbar region: Secondary | ICD-10-CM | POA: Diagnosis not present

## 2021-03-03 DIAGNOSIS — M545 Low back pain, unspecified: Secondary | ICD-10-CM | POA: Diagnosis not present

## 2021-03-03 DIAGNOSIS — M5136 Other intervertebral disc degeneration, lumbar region: Secondary | ICD-10-CM | POA: Diagnosis not present

## 2021-03-06 IMAGING — XA IR PICC >5YO
1 series · 3 of 3 positions shown · IV contrast (agent unspecified)
Comparison: none

INDICATION: Patient with rectal cancer requiring reliable access for
chemotherapy. Interventional Radiology asked to place a PICC.

EXAM:
ULTRASOUND AND FLUOROSCOPIC GUIDED PICC LINE INSERTION
MEDICATIONS:
1% lidocaine, 1 ml
CONTRAST:  None
FLUOROSCOPY TIME:  36 seconds (4 mGy)
COMPLICATIONS:
None immediate.
TECHNIQUE: The procedure, risks, benefits, and alternatives were explained to
the patient and informed written consent was obtained.

[Series 300: ir fluoro guide cv line right · 3 of 3 slices shown]
[im 1/3]
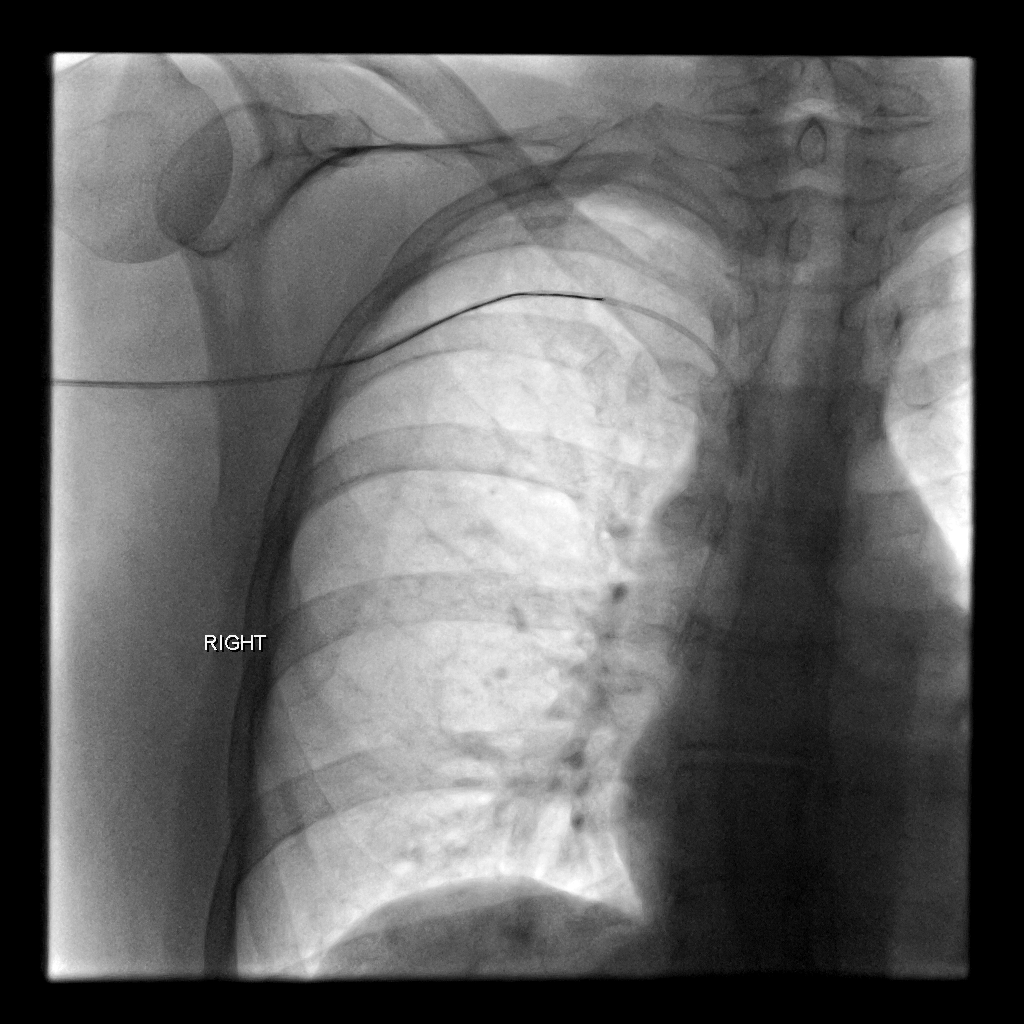
[im 2/3]
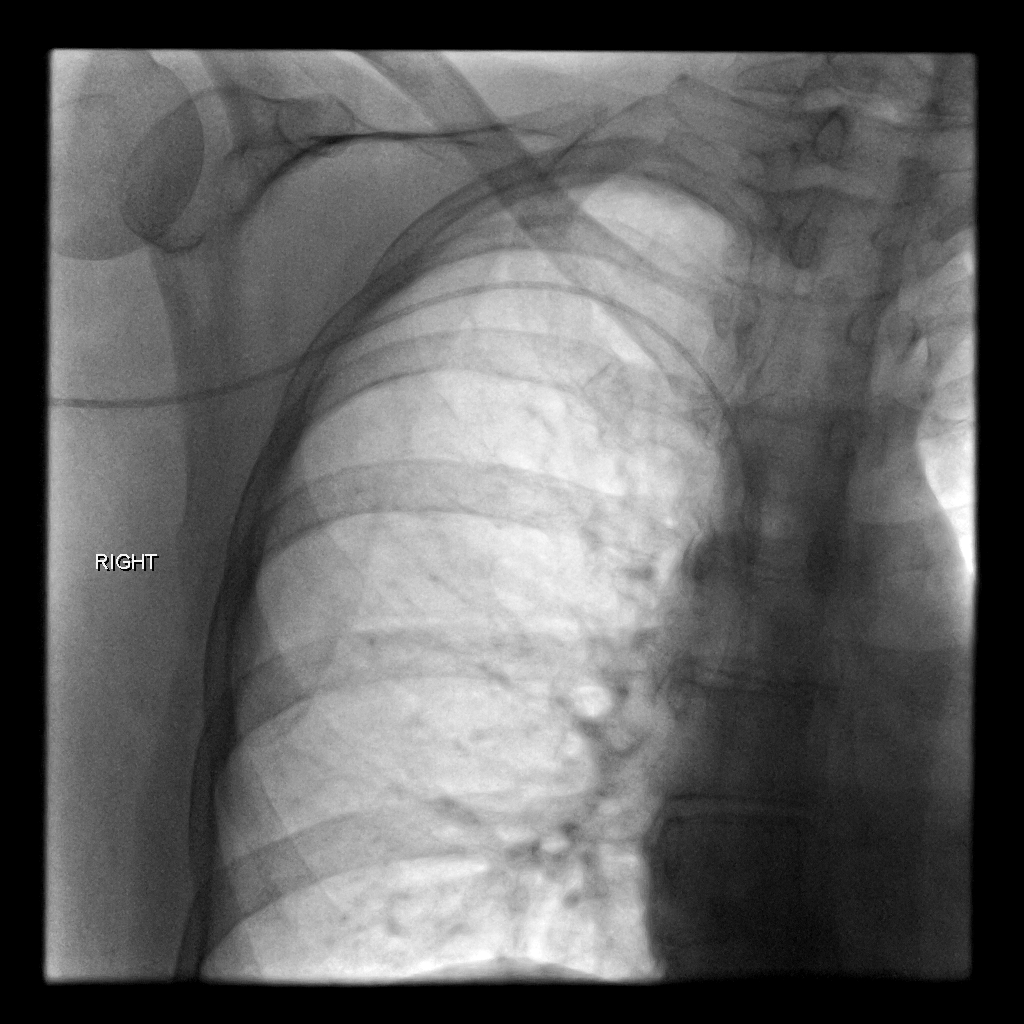
[im 3/3]
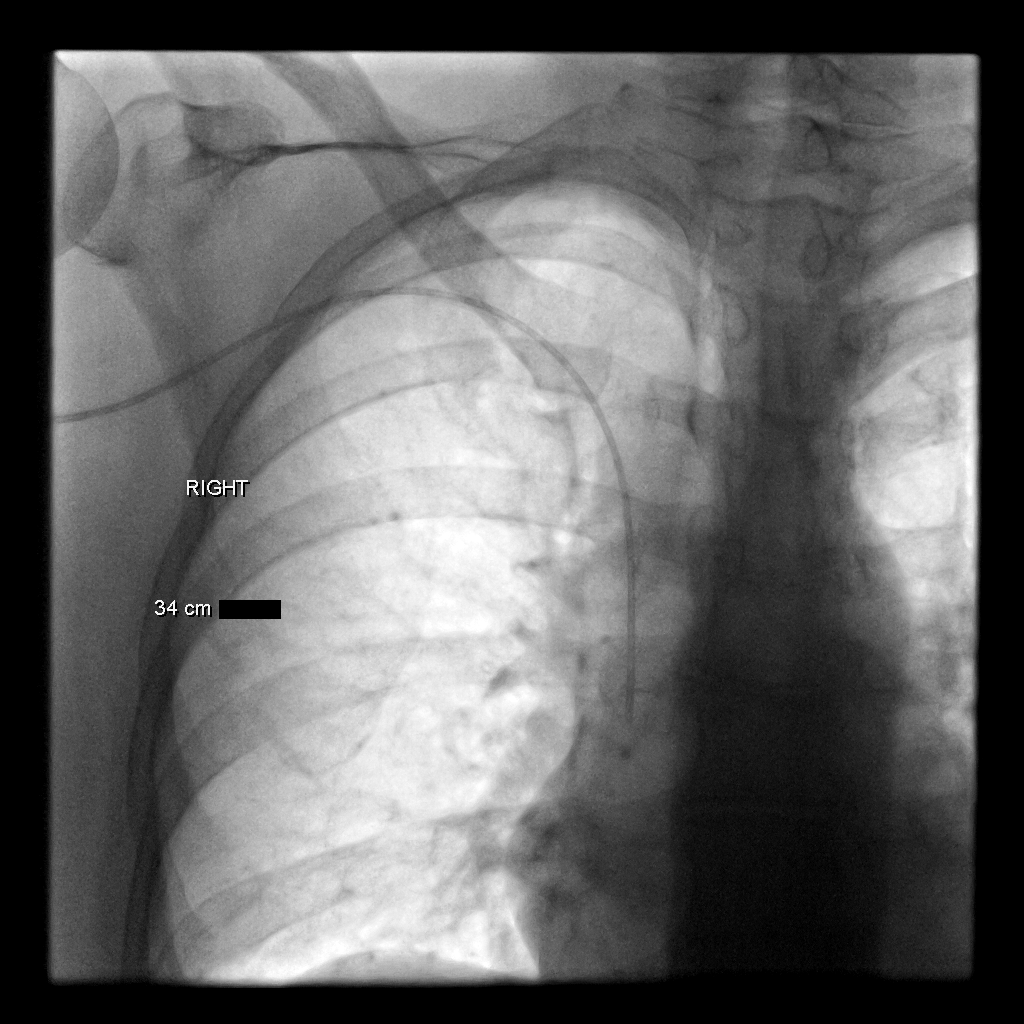

[3 of 3 positions shown; findings below may reference images not displayed]

The right upper extremity was prepped with chlorhexidine in a
sterile fashion, and a sterile drape was applied covering the
operative field. Maximum barrier sterile technique with sterile
gowns and gloves were used for the procedure. A timeout was
performed prior to the initiation of the procedure. Local anesthesia
was provided with 1% lidocaine.

After the overlying soft tissues were anesthetized with 1%
lidocaine, a micropuncture kit was utilized to access the right
basilic vein. Real-time ultrasound guidance was utilized for
vascular access including the acquisition of a permanent ultrasound
image documenting patency of the accessed vessel.

A guidewire was advanced to the level of the superior caval-atrial
junction for measurement purposes and the PICC line was cut to
length. A peel-away sheath was placed and a 34 cm, 5 French, single
lumen was inserted to level of the superior caval-atrial junction. A
post procedure spot fluoroscopic was obtained. The catheter easily
aspirated and flushed and was secured in place. A dressing was
placed. The patient tolerated the procedure well without immediate
post procedural complication.
FINDINGS: After catheter placement, the tip lies within the superior
cavoatrial junction. The catheter aspirates and flushes normally and
is ready for immediate use.
IMPRESSION: Successful ultrasound and fluoroscopic guided placement of a right
basilic vein approach, 34 cm, 5 French, single lumen PICC with tip
at the superior caval-atrial junction. The PICC line is ready for
immediate use. Read by: Raunel Gal, NP

## 2021-03-17 DIAGNOSIS — M5137 Other intervertebral disc degeneration, lumbosacral region: Secondary | ICD-10-CM | POA: Diagnosis not present

## 2021-03-17 DIAGNOSIS — M9903 Segmental and somatic dysfunction of lumbar region: Secondary | ICD-10-CM | POA: Diagnosis not present

## 2021-03-17 DIAGNOSIS — M5136 Other intervertebral disc degeneration, lumbar region: Secondary | ICD-10-CM | POA: Diagnosis not present

## 2021-03-17 DIAGNOSIS — M545 Low back pain, unspecified: Secondary | ICD-10-CM | POA: Diagnosis not present

## 2021-03-23 DIAGNOSIS — E119 Type 2 diabetes mellitus without complications: Secondary | ICD-10-CM | POA: Diagnosis not present

## 2021-03-23 DIAGNOSIS — D649 Anemia, unspecified: Secondary | ICD-10-CM | POA: Diagnosis not present

## 2021-03-31 ENCOUNTER — Other Ambulatory Visit (HOSPITAL_COMMUNITY): Payer: Self-pay | Admitting: *Deleted

## 2021-03-31 DIAGNOSIS — Z85048 Personal history of other malignant neoplasm of rectum, rectosigmoid junction, and anus: Secondary | ICD-10-CM | POA: Diagnosis not present

## 2021-04-01 ENCOUNTER — Other Ambulatory Visit: Payer: Self-pay

## 2021-04-01 ENCOUNTER — Ambulatory Visit (HOSPITAL_COMMUNITY)
Admission: RE | Admit: 2021-04-01 | Discharge: 2021-04-01 | Disposition: A | Discharge status: Home / Self Care | Payer: BC Managed Care – PPO | Source: Ambulatory Visit | Attending: Internal Medicine | Admitting: Internal Medicine

## 2021-04-01 DIAGNOSIS — D649 Anemia, unspecified: Secondary | ICD-10-CM | POA: Diagnosis not present

## 2021-04-01 MED ORDER — SODIUM CHLORIDE 0.9 % IV SOLN
510.0000 mg | INTRAVENOUS | Status: DC
  Administered 2021-04-01: 13:00:00 510 mg via INTRAVENOUS
  Filled 2021-04-01: qty 17

## 2021-04-02 DIAGNOSIS — M545 Low back pain, unspecified: Secondary | ICD-10-CM | POA: Diagnosis not present

## 2021-04-02 DIAGNOSIS — M5136 Other intervertebral disc degeneration, lumbar region: Secondary | ICD-10-CM | POA: Diagnosis not present

## 2021-04-02 DIAGNOSIS — M9903 Segmental and somatic dysfunction of lumbar region: Secondary | ICD-10-CM | POA: Diagnosis not present

## 2021-04-02 DIAGNOSIS — M5137 Other intervertebral disc degeneration, lumbosacral region: Secondary | ICD-10-CM | POA: Diagnosis not present

## 2021-04-08 ENCOUNTER — Ambulatory Visit (HOSPITAL_COMMUNITY)
Admission: RE | Admit: 2021-04-08 | Discharge: 2021-04-08 | Disposition: A | Discharge status: Home / Self Care | Payer: BC Managed Care – PPO | Source: Ambulatory Visit | Attending: Internal Medicine | Admitting: Internal Medicine

## 2021-04-08 ENCOUNTER — Other Ambulatory Visit: Payer: Self-pay

## 2021-04-08 DIAGNOSIS — D649 Anemia, unspecified: Secondary | ICD-10-CM | POA: Insufficient documentation

## 2021-04-08 MED ORDER — SODIUM CHLORIDE 0.9 % IV SOLN
510.0000 mg | INTRAVENOUS | Status: DC
  Administered 2021-04-08: 510 mg via INTRAVENOUS
  Filled 2021-04-08: qty 510

## 2021-04-13 IMAGING — CT CT ABD-PELV W/ CM
2 of 5 series · 15 of 46 positions shown, 17 images · IV contrast (APPLIED)
Comparison: September 04, 2019

CLINICAL DATA: Abdominal pain and fever, history of squamous cell

EXAM:
CT ABDOMEN AND PELVIS WITH CONTRAST
TECHNIQUE: Multidetector CT imaging of the abdomen and pelvis was performed
using the standard protocol following bolus administration of
intravenous contrast.
CONTRAST:  100mL OMNIPAQUE IOHEXOL 300 MG/ML  SOLN

[Series 2: axial st · axial · 0.81mm/px · z∈[-752,-362]mm · 12 of 91 slices shown, 14 images]
[im 7/91  soft-tissue]
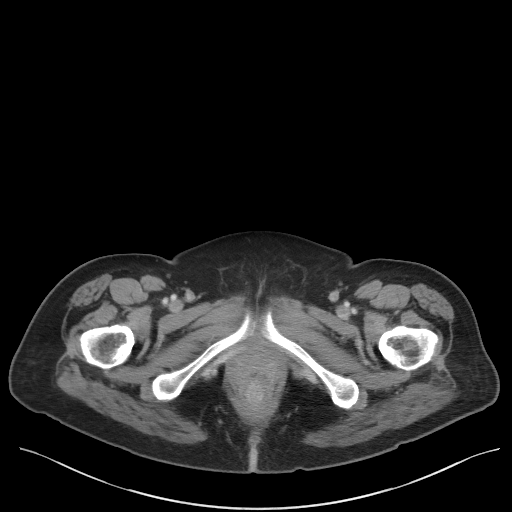
[im 7/91  bone]
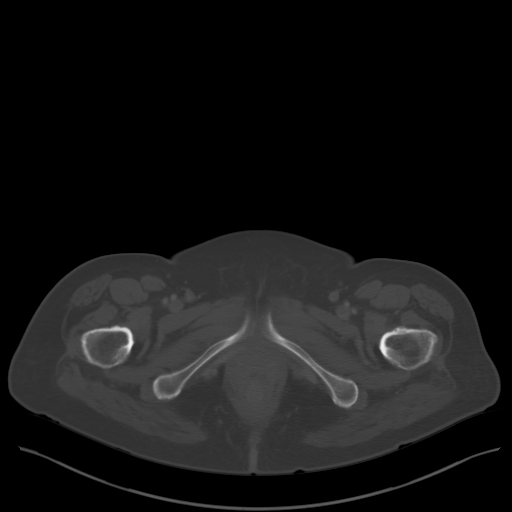
[im 13/91  soft-tissue]
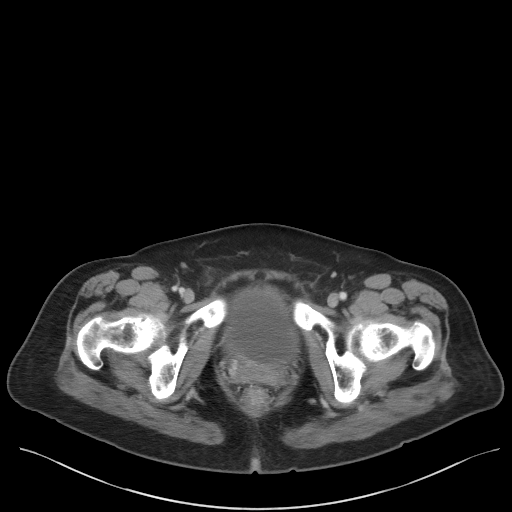
[im 19/91  soft-tissue]
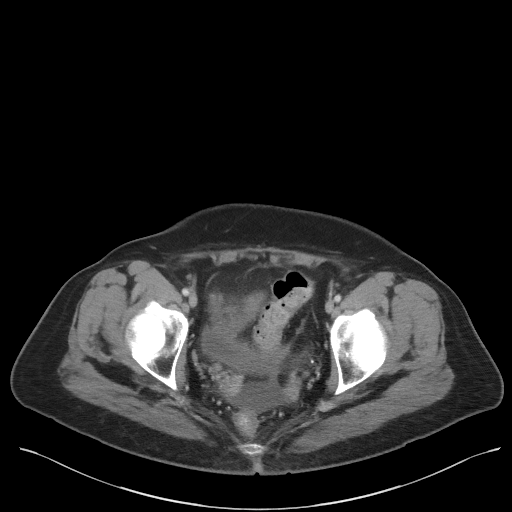
[im 31/91  soft-tissue]
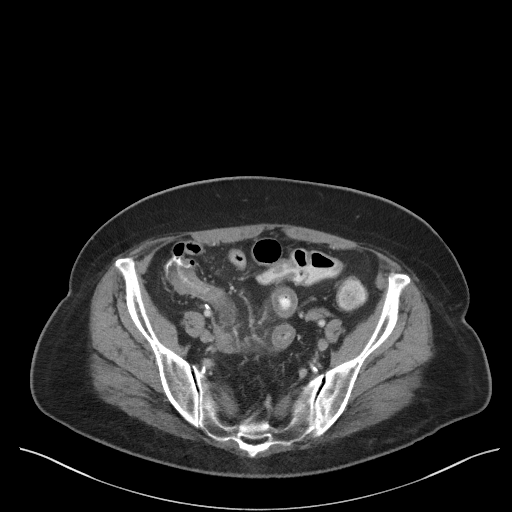
[im 37/91  soft-tissue]
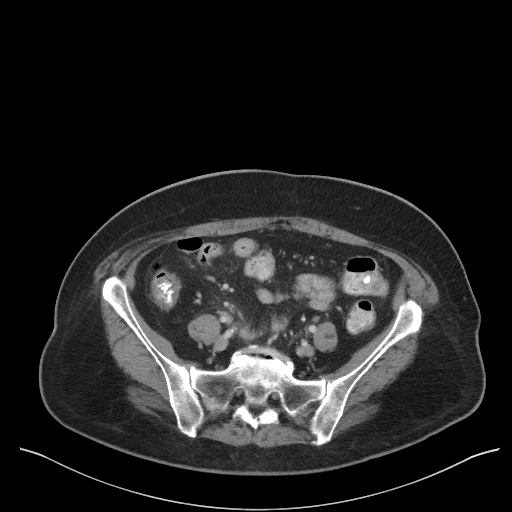
[im 43/91  soft-tissue]
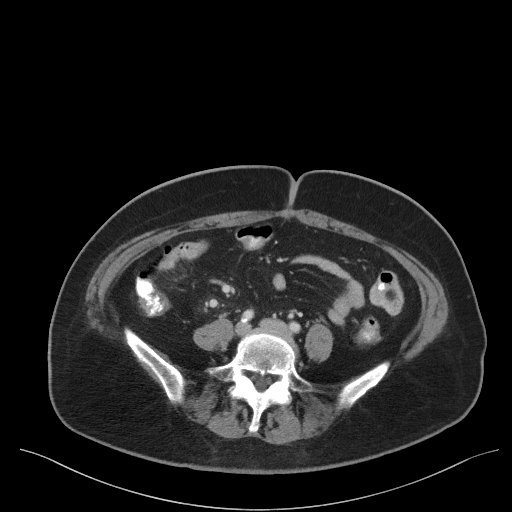
[im 49/91  soft-tissue]
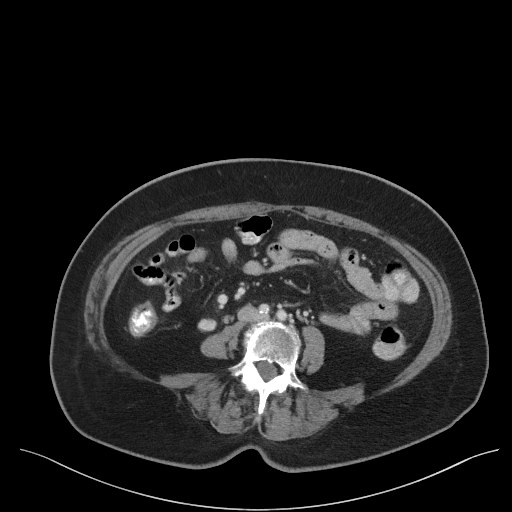
[im 55/91  soft-tissue]
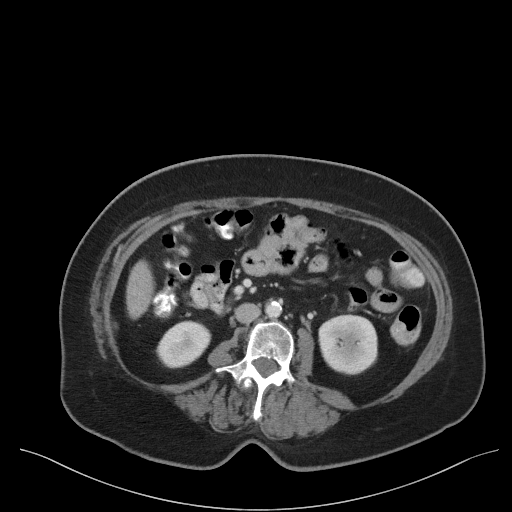
[im 61/91  soft-tissue]
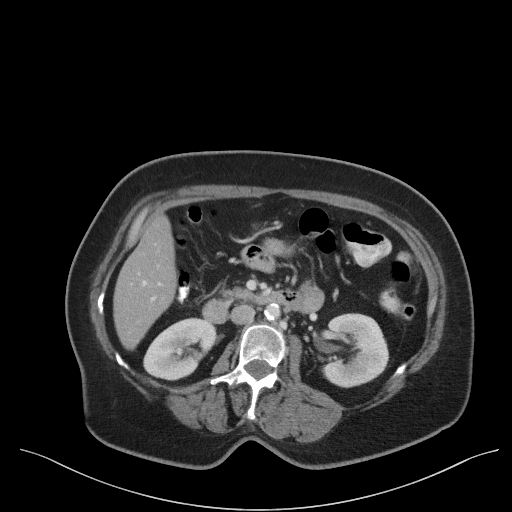
[im 61/91  bone]
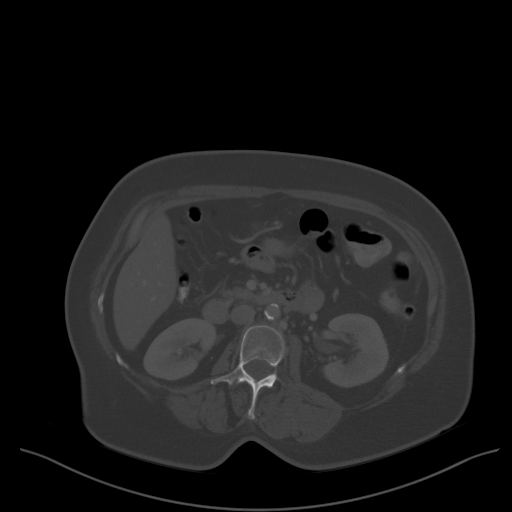
[im 73/91  soft-tissue]
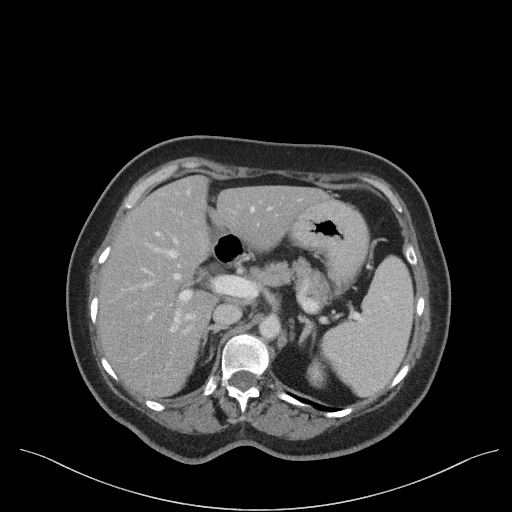
[im 79/91  soft-tissue]
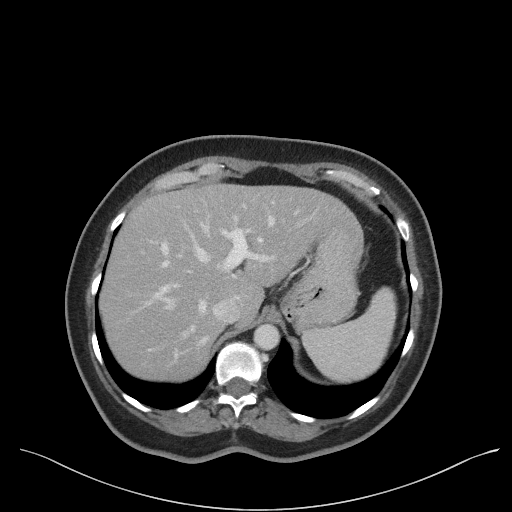
[im 85/91  soft-tissue]
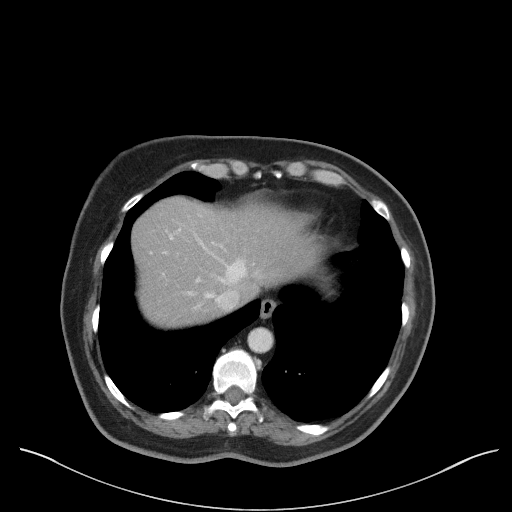

[Series 4: coronal st · coronal · 0.73mm/px · 3 of 95 slices shown]
[im 32/95  soft-tissue]
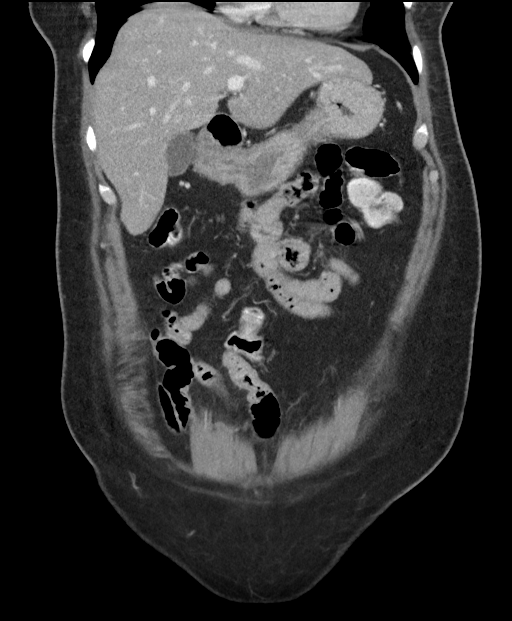
[im 42/95  soft-tissue]
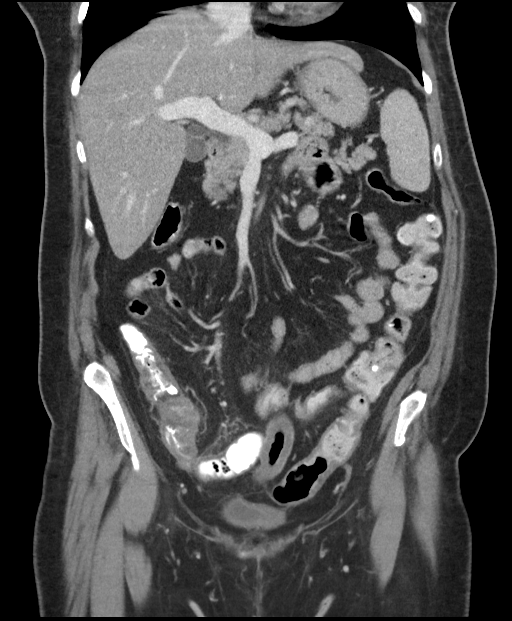
[im 53/95  soft-tissue]
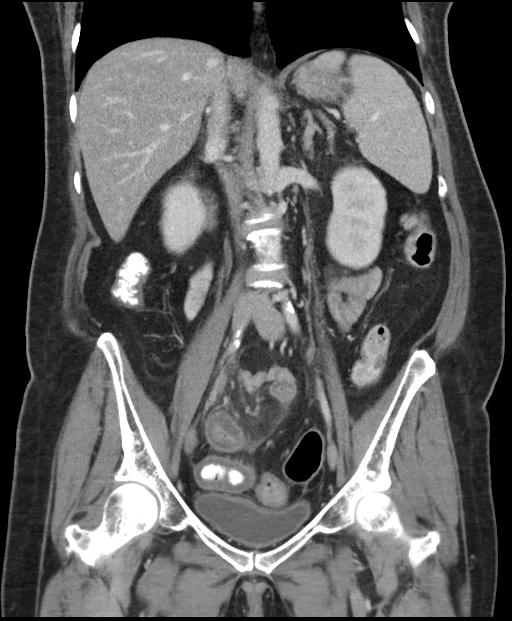

[15 of 46 positions shown; findings below may reference images not displayed]

FINDINGS: Lower chest: The visualized heart size within normal limits. There
is a trace pericardial effusion.

No hiatal hernia.

The visualized portions of the lungs are clear.

Hepatobiliary: There is diffuse low density seen throughout the
liver parenchyma. No focal hepatic lesion is seen. No intra or
extrahepatic biliary ductal dilatation. The main portal vein is
patent. No evidence of calcified gallstones, gallbladder wall
thickening or biliary dilatation.

Pancreas: Unremarkable. No pancreatic ductal dilatation or
surrounding inflammatory changes.

Spleen: Normal in size without focal abnormality.

Adrenals/Urinary Tract: Both adrenal glands appear normal. There is
a punctate calcifications seen in the upper pole of the right
kidney. There is areas of scarring seen within the upper pole. A 1
cm low-density lesion seen in the lower pole the right kidney. No
left-sided renal or collecting system calculi. There is mild diffuse
bladder wall thickening.

Stomach/Bowel: The stomach and proximal small bowel are
unremarkable. There is a long segment of ileum with diffuse wall
thickening surrounding fat stranding changes to the terminal ileum.
There is also wall thickening seen around the distal cecal pole.
Mild wall thickening with surrounding fat stranding changes are seen
around the sigmoid colon extending to the sigmoid rectal junction.
No loculated fluid collections or free air however are noted.

Vascular/Lymphatic: There are no enlarged mesenteric,
retroperitoneal, or pelvic lymph nodes. Scattered aortic
atherosclerotic calcifications are seen without aneurysmal
dilatation.

Reproductive: The patient is status post hysterectomy. No adnexal
masses or collections seen.

Other: A small amount of free fluid is seen within the deep
cul-de-sac.

Musculoskeletal: No acute or significant osseous findings.
IMPRESSION: Diffuse wall thickening with inflammatory changes involving a long
segment of distal ileum, cecum, and sigmoid colon likely represents
enteritis/colitis. This could be due to infectious, or inflammatory
nature, not thought to represent ischemic colitis.

Hepatic steatosis

Trace pericardial effusion

Small free fluid in the deep pelvis

## 2021-04-14 DIAGNOSIS — M9903 Segmental and somatic dysfunction of lumbar region: Secondary | ICD-10-CM | POA: Diagnosis not present

## 2021-04-14 DIAGNOSIS — M545 Low back pain, unspecified: Secondary | ICD-10-CM | POA: Diagnosis not present

## 2021-04-14 DIAGNOSIS — M5137 Other intervertebral disc degeneration, lumbosacral region: Secondary | ICD-10-CM | POA: Diagnosis not present

## 2021-04-14 DIAGNOSIS — M5136 Other intervertebral disc degeneration, lumbar region: Secondary | ICD-10-CM | POA: Diagnosis not present

## 2021-04-27 DIAGNOSIS — N200 Calculus of kidney: Secondary | ICD-10-CM | POA: Diagnosis not present

## 2021-04-27 DIAGNOSIS — Z87440 Personal history of urinary (tract) infections: Secondary | ICD-10-CM | POA: Diagnosis not present

## 2021-05-05 DIAGNOSIS — M9903 Segmental and somatic dysfunction of lumbar region: Secondary | ICD-10-CM | POA: Diagnosis not present

## 2021-05-05 DIAGNOSIS — M5136 Other intervertebral disc degeneration, lumbar region: Secondary | ICD-10-CM | POA: Diagnosis not present

## 2021-05-05 DIAGNOSIS — M545 Low back pain, unspecified: Secondary | ICD-10-CM | POA: Diagnosis not present

## 2021-05-05 DIAGNOSIS — M5137 Other intervertebral disc degeneration, lumbosacral region: Secondary | ICD-10-CM | POA: Diagnosis not present

## 2021-05-13 DIAGNOSIS — F3341 Major depressive disorder, recurrent, in partial remission: Secondary | ICD-10-CM | POA: Diagnosis not present

## 2021-06-05 ENCOUNTER — Ambulatory Visit (HOSPITAL_COMMUNITY)
Admission: RE | Admit: 2021-06-05 | Discharge: 2021-06-05 | Disposition: A | Discharge status: Home / Self Care | Payer: BC Managed Care – PPO | Source: Ambulatory Visit | Attending: Hematology | Admitting: Hematology

## 2021-06-05 ENCOUNTER — Other Ambulatory Visit: Payer: BC Managed Care – PPO

## 2021-06-05 ENCOUNTER — Encounter (HOSPITAL_COMMUNITY): Payer: Self-pay

## 2021-06-05 ENCOUNTER — Inpatient Hospital Stay: Payer: BC Managed Care – PPO | Attending: Hematology

## 2021-06-05 ENCOUNTER — Other Ambulatory Visit: Payer: Self-pay

## 2021-06-05 DIAGNOSIS — C44520 Squamous cell carcinoma of anal skin: Secondary | ICD-10-CM | POA: Diagnosis not present

## 2021-06-05 DIAGNOSIS — C210 Malignant neoplasm of anus, unspecified: Secondary | ICD-10-CM | POA: Diagnosis not present

## 2021-06-05 LAB — IRON AND IRON BINDING CAPACITY (CC-WL,HP ONLY)
Iron: 73 ug/dL (ref 28–170)
Saturation Ratios: 20 % (ref 10.4–31.8)
TIBC: 374 ug/dL (ref 250–450)
UIBC: 301 ug/dL (ref 148–442)

## 2021-06-05 LAB — CMP (CANCER CENTER ONLY)
ALT: 31 U/L (ref 0–44)
AST: 21 U/L (ref 15–41)
Albumin: 4.3 g/dL (ref 3.5–5.0)
Alkaline Phosphatase: 53 U/L (ref 38–126)
Anion gap: 6 (ref 5–15)
BUN: 24 mg/dL — ABNORMAL HIGH (ref 8–23)
CO2: 27 mmol/L (ref 22–32)
Calcium: 9.7 mg/dL (ref 8.9–10.3)
Chloride: 106 mmol/L (ref 98–111)
Creatinine: 0.86 mg/dL (ref 0.44–1.00)
GFR, Estimated: >60 mL/min (ref >60)
Glucose, Bld: 148 mg/dL — ABNORMAL HIGH (ref 70–99)
Potassium: 3.9 mmol/L (ref 3.5–5.1)
Sodium: 139 mmol/L (ref 135–145)
Total Bilirubin: 0.3 mg/dL (ref 0.3–1.2)
Total Protein: 6.8 g/dL (ref 6.5–8.1)

## 2021-06-05 LAB — FERRITIN: Ferritin: 47 ng/mL (ref 11–307)

## 2021-06-05 MED ORDER — SODIUM CHLORIDE (PF) 0.9 % IJ SOLN
INTRAMUSCULAR | Status: AC
  Filled 2021-06-05: qty 50

## 2021-06-05 MED ORDER — IOHEXOL 350 MG/ML SOLN
80.0000 mL | Freq: Once | INTRAVENOUS | Status: AC | PRN
  Administered 2021-06-05: 14:00:00 80 mL via INTRAVENOUS

## 2021-06-09 ENCOUNTER — Other Ambulatory Visit: Payer: Self-pay

## 2021-06-09 DIAGNOSIS — C210 Malignant neoplasm of anus, unspecified: Secondary | ICD-10-CM

## 2021-06-12 ENCOUNTER — Inpatient Hospital Stay: Payer: BC Managed Care – PPO | Attending: Hematology | Admitting: Hematology

## 2021-06-12 ENCOUNTER — Encounter: Payer: Self-pay | Admitting: Hematology

## 2021-06-12 ENCOUNTER — Other Ambulatory Visit: Payer: Self-pay | Admitting: Hematology

## 2021-06-12 ENCOUNTER — Other Ambulatory Visit: Payer: Self-pay

## 2021-06-12 VITALS — BP 131/73 | HR 78 | Temp 97.8°F | Resp 16 | Ht 66.0 in | Wt 163.4 lb

## 2021-06-12 DIAGNOSIS — E119 Type 2 diabetes mellitus without complications: Secondary | ICD-10-CM | POA: Insufficient documentation

## 2021-06-12 DIAGNOSIS — D509 Iron deficiency anemia, unspecified: Secondary | ICD-10-CM | POA: Insufficient documentation

## 2021-06-12 DIAGNOSIS — Z79899 Other long term (current) drug therapy: Secondary | ICD-10-CM | POA: Diagnosis not present

## 2021-06-12 DIAGNOSIS — D61810 Antineoplastic chemotherapy induced pancytopenia: Secondary | ICD-10-CM | POA: Insufficient documentation

## 2021-06-12 DIAGNOSIS — C210 Malignant neoplasm of anus, unspecified: Secondary | ICD-10-CM

## 2021-06-12 DIAGNOSIS — D701 Agranulocytosis secondary to cancer chemotherapy: Secondary | ICD-10-CM | POA: Diagnosis not present

## 2021-06-12 DIAGNOSIS — T451X5A Adverse effect of antineoplastic and immunosuppressive drugs, initial encounter: Secondary | ICD-10-CM | POA: Diagnosis not present

## 2021-06-12 NOTE — Progress Notes (Signed)
[place]   [phone] [phone]   Clinic Follow up Note   Patient Care Team: Crist Infante, MD as PCP - General (Internal Medicine) Fay Records, MD as PCP - Cardiology (Cardiology) Nobie Putnam, MD (Hematology and Oncology) Heilingoetter, Tobe Sos, PA-C as Physician Assistant (Oncology) Truitt Merle, MD as Consulting Physician (Oncology) Jonnie Finner, RN (Inactive) as Oncology Nurse Navigator Ileana Roup, MD as Consulting Physician (General Surgery)  Date of Service:  06/12/2021  CHIEF COMPLAINT: f/u of anal cancer  CURRENT THERAPY:  Surveillance  ASSESSMENT & PLAN:  Melinda [name] is a 61 y.o. female with   1. Recurrent squamous cell carcinoma the anus, pT1N0M0  -She initially presented to Dr Hassell Done for a hemorrhoidectomy and EUA on 07/13/19 with invasive moderately differentiated squamous cell carcinoma, 1.4 cm, pT1, pNx. -She had local recurrence and underwent surgical excision under the care of Dr. Dema Severin on 01/11/20. Surgical path was consistent with squamous cell carcinoma of the anus. 01/28/20 staging PET showed no node or distant metastasis. -She was only able to complete 5 out of the planned 6 weeks of the standard concurrent chemoRT with Mitomycin and 5FU on due to poor tolerance of local skin irritation, N/V/D and had prolonged recovery -CT CAP on 06/05/21 showed NED, I personally reviewed the images and discussed with her  -She is clinically doing well. Labs reviewed, overall improved. Continue surveillance. F/u in 6 months     2. Iron deficiency anemia -Etiology unclear as to her iron deficiency. S/P hysterectomy -Currently managed by PCP, Dr. Joylene Draft.  -She received iron infusions with Feraheme PRN, last 04/08/21 -she continues on oral iron   4. DM, Health Maintenance  -Continue to f/u with PCP for management -I encouraged her to do yearly mammograms and continue Pap smear if she has cervix s/p hysterectomy.  -she  notes she is due for colonoscopy but is interested in referral to a different practice. I will send a referral to [facility].   5. Neutropenia, Thrombocytopenia  -Secondary to chemo, overall mild and improved -ANC and platelets have normalized off chemo. She still has mildly low WBC but overall improved.      PLAN:  -referral to GI for colonosocpy (she would like to transfer her GI care from [facility] to [facility]) -f/u with Dr. Dema Severin in 3 months -lab and f/u with me in 6 months   No problem-specific Assessment & Plan notes found for this encounter.   SUMMARY OF ONCOLOGIC HISTORY: Oncology History Overview Note  Cancer Staging No matching staging information was found for the patient.    Squamous cell cancer, anus ([place])  07/13/2019 Procedure    EUA, Excision of posterior internal/external hemorrhoid under the care of Dr. Hassell Done    07/13/2019 Pathology Results   - Invasive moderately differentiated squamous cell carcinoma, 1.4 cm.  See comment  - Carcinoma invades for depth of 0.4 cm  - Deep resection margin is negative for carcinoma (0.2 cm)  - Lateral mucosal margin is positive for high-grade dysplasia  - No evidence of lymphovascular perineural invasion  Procedure: Local excision  Tumor Site: Anal canal  Tumor Size: 1.4 cm  Histologic Type: Invasive squamous cell carcinoma  Histologic Grade: G2: Moderately differentiated  Tumor Extension: Carcinoma invades superficial anal sphincter muscle  Margins: Uninvolved by tumor  Treatment Effect: N/A  Regional Lymph Nodes: No lymph nodes submitted or found  Pathologic Stage Classification (pTNM, AJCC 8th Edition):  pT1, pNx  Representative Tumor Block:  A1  Comment(s): Lateral mucosal margin is positive for high-grade squamous  dysplasia      07/13/2019 Initial Diagnosis   Squamous cell cancer, anus ([place])   09/04/2019 Imaging   CT scan Chest, Abdomen, and Pelvis   IMPRESSION: 1. No evidence of metastatic disease in the chest,  abdomen or pelvis. 2. No discrete anorectal mass. 3. Chronic findings include: Punctate nonobstructing upper right renal stones and chronic right renal scarring. 4. Aortic Atherosclerosis (ICD10-I70.0).   01/10/2020 Pathology Results   A. ANAL LESION, POSTERIOR MIDLINE, EXCISION:  - Squamous cell carcinoma, moderately differentiated.  Verbally reported by Dr. Saralyn Pilar 1.5 cm, negative margins, depth <1 mm   01/11/2020 Procedure   1. Excision of anal canal lesion (posterior midline) under the care of Dr. Dema Severin     01/28/2020 PET scan   IMPRESSION: 1. Mild focal anal hypermetabolism without discrete mass correlate on the CT images, nonspecific, differential includes postsurgical change or residual anal tumor. 2. No hypermetabolic locoregional or distant metastatic disease. 3. Chronic findings include: Aortic Atherosclerosis (ICD10-I70.0). Diffuse hepatic steatosis. Nonobstructing right nephrolithiasis.     02/04/2020 - 03/07/2020 Radiation Therapy   concurrent chemoRT by Dr Lisbeth Renshaw with Mitomycin and 5FU starting 02/04/20-03/07/20. The last 4 session cancelled due to poor toleration.    02/04/2020 - 03/03/2020 Chemotherapy   Concurrent chemoRT with Mitomycin and 5FU on week 1 and 5 starting 02/04/20-03/03/20   03/13/2020 Imaging   CT AP W contrast  IMPRESSION: Diffuse wall thickening with inflammatory changes involving a long segment of distal ileum, cecum, and sigmoid colon likely represents enteritis/colitis. This could be due to infectious, or inflammatory nature, not thought to represent ischemic colitis.   Hepatic steatosis   Trace pericardial effusion   Small free fluid in the deep pelvis   06/04/2020 Imaging   PET IMPRESSION: 1. Diminished metabolic uptake about the rectum with presumed post radiation changes showing mild perirectal stranding and perianal stranding on today's exam. Continued correlation with direct visualization may be helpful to exclude any residual tumor  in this location but diminished FDG uptake is encouraging and findings could certainly be seen in the setting of post treatment change. 2. No signs of metastatic disease to the neck, chest, abdomen or pelvis. 3. Diminished perienteric stranding about distal small bowel with persistent mild stranding and stool like material in distal small bowel likely related to enteritis and associated with mild but diminished FDG uptake. Attention on follow-up. 4. Resolution of RIGHT-sided hydronephrosis with signs of nephrolithiasis as before. 5. Mild asymmetry of metabolic activity along the RIGHT chest wall associated serrated S musculature adjacent to the tip of the scapula felt to represent asymmetric muscular activity. Correlate with any symptoms in this area and suggest attention on follow-up.   Aortic Atherosclerosis (ICD10-I70.0).   06/05/2021 Imaging   EXAM: CT CHEST, ABDOMEN, AND PELVIS WITH CONTRAST  IMPRESSION: 1. No new or progressive findings to suggest metastatic disease in the chest, abdomen, or pelvis. 2. The wall thickening and perienteric edema associated with the terminal ileum on prior studies has resolved completely. Presacral and perirectal edema/stranding seen previously has also resolved. 3. Tiny bilateral pulmonary nodules, stable since prior PET-CT, consistent with benign etiology. 4. Right renal cortical scarring with tiny nonobstructing stones. 5. Aortic Atherosclerosis (ICD10-I70.0).      INTERVAL HISTORY:  Melinda [name] is here for a follow up of anal cancer. She was last seen by me on 02/02/21. She presents to the clinic accompanied by her husband. She reports she is  doing well and has been able to recover well from prior chemo.   All other systems were reviewed with the patient and are negative.  MEDICAL HISTORY:  Past Medical History:  Diagnosis Date   anal ca dx'd 07/2019   Diabetes mellitus type 2, controlled ([place])    Dyslipidemia    untreated    GERD (gastroesophageal reflux disease)    Hemorrhoids    Microcytic anemia 06/22/2012   Nephrolith    Palpitation    positive tilt table test. Prozac and Inderal treatment effective.    SURGICAL HISTORY: Past Surgical History:  Procedure Laterality Date   APPENDECTOMY     BREAST REDUCTION SURGERY     CARDIAC CATHETERIZATION  2002   normal LV function and no significant coronary obstruction   CATARACT EXTRACTION, BILATERAL Bilateral november 2020 and dcember 2020   CYST EXCISION N/A 01/11/2020   Procedure: EXCISION OF ANAL CANAL MASS;  Surgeon: Ileana Roup, MD;  Location: [facility];  Service: General;  Laterality: N/A;   CYSTOSCOPY/URETEROSCOPY/HOLMIUM LASER/STENT PLACEMENT Right 03/26/2020   Procedure: CYSTOSCOPY/URETEROSCOPY/HOLMIUM LASER/STENT PLACEMENT;  Surgeon: [name], MD;  Location: [facility];  Service: Urology;  Laterality: Right;   ESOPHAGEAL MANOMETRY N/A 04/04/2017   Procedure: ESOPHAGEAL MANOMETRY (EM);  Surgeon: Laurence Spates, MD;  [facility];  Service: Endoscopy;  Laterality: N/A;   HEMORRHOID SURGERY N/A 07/13/2019   Procedure: HEMORRHOIDECTOMY;  Surgeon: Johnathan Hausen, MD;  Location: [place];  Service: General;  Laterality: N/A;   KIDNEY SURGERY     kidney stones    PARTIAL HYSTERECTOMY     RECTAL EXAM UNDER ANESTHESIA N/A 01/11/2020   Procedure: RECTAL EXAM UNDER ANESTHESIA;  Surgeon: Ileana Roup, MD;  [facility];  Service: General;  Laterality: N/A;   TONSILLECTOMY      I have reviewed the social history and family history with the patient and they are unchanged from previous note.  ALLERGIES:  is allergic to cephalexin, amoxapine and related, amoxicillin-pot clavulanate, atorvastatin, cefaclor, ciprofloxacin, codeine, doxycycline calcium, fenofibrate micronized, fish oil, flagyl [metronidazole hcl], levofloxacin in d5w, metronidazole, ofloxacin, prednisolone, promethazine hcl, tape, tetracycline hcl,  erythromycin, and nitrofurantoin.  MEDICATIONS:  Current Outpatient Medications  Medication Sig Dispense Refill   clorazepate (TRANXENE) 7.5 MG tablet Take 7.5 mg by mouth 2 (two) times daily.  1   CRESTOR 5 MG tablet Take 5 mg by mouth every evening.   1   docusate sodium (COLACE) 100 MG capsule Take 1 capsule (100 mg total) by mouth 2 (two) times daily. 30 capsule 0   esomeprazole (NEXIUM) 40 MG capsule Take 40 mg by mouth daily at 12 noon.     FLUoxetine (PROZAC) 20 MG capsule Take 20 mg by mouth every morning.     methenamine (HIPREX) 1 g tablet Take 1 g by mouth daily.      propranolol (INDERAL) 10 MG tablet Take 1 tablet (10 mg total) by mouth daily. 90 tablet 0   No current facility-administered medications for this visit.    PHYSICAL EXAMINATION: ECOG PERFORMANCE STATUS: 0 - Asymptomatic  Vitals:   06/12/21 1208  BP: 131/73  Pulse: 78  Resp: 16  Temp: 97.8 F (36.6 C)  SpO2: 96%   Wt Readings from Last 3 Encounters:  06/12/21 163 lb 6.4 oz (74.1 kg)  04/01/21 159 lb (72.1 kg)  02/02/21 158 lb 6.4 oz (71.8 kg)     GENERAL:alert, no distress and comfortable SKIN: skin color, texture, turgor are normal, no  rashes or significant lesions EYES: normal, Conjunctiva are pink and non-injected, sclera clear  NECK: supple, thyroid normal size, non-tender, without nodularity LYMPH:  no palpable lymphadenopathy in the cervical, axillary  LUNGS: clear to auscultation and percussion with normal breathing effort HEART: regular rate & rhythm and no murmurs and no lower extremity edema ABDOMEN:abdomen soft, non-tender and normal bowel sounds Musculoskeletal:no cyanosis of digits and no clubbing  NEURO: alert & oriented x 3 with fluent speech, no focal motor/sensory deficits RECTAL: (+) hemorrhoids present, non-bleeding. No palpable mass. No blood on glove. Normal stool present. Benign exam    LABORATORY DATA:  I have reviewed the data as listed CBC Latest Ref Rng & Units  02/02/2021 10/08/2020 06/04/2020  WBC 4.0 - 10.5 K/uL 3.7(L) 3.2(L) 3.0(L)  Hemoglobin 12.0 - 15.0 g/dL 11.1(L) 11.8(L) 11.2(L)  Hematocrit 36.0 - 46.0 % 34.9(L) 36.3 34.7(L)  Platelets 150 - 400 K/uL 218 184 149(L)     CMP Latest Ref Rng & Units 06/05/2021 02/02/2021 10/08/2020  Glucose 70 - 99 mg/dL 148(H) 200(H) 164(H)  BUN 8 - 23 mg/dL 24(H) 21 25(H)  Creatinine 0.44 - 1.00 mg/dL 0.86 1.03(H) 0.97  Sodium 135 - 145 mmol/L 139 142 141  Potassium 3.5 - 5.1 mmol/L 3.9 4.1 4.1  Chloride 98 - 111 mmol/L 106 107 107  CO2 22 - 32 mmol/L 27 26 27   Calcium 8.9 - 10.3 mg/dL 9.7 9.5 9.6  Total Protein 6.5 - 8.1 g/dL 6.8 7.0 7.1  Total Bilirubin 0.3 - 1.2 mg/dL 0.3 0.4 0.4  Alkaline Phos 38 - 126 U/L 53 71 64  AST 15 - 41 U/L 21 17 19   ALT 0 - 44 U/L 31 21 19       RADIOGRAPHIC STUDIES: I have personally reviewed the radiological images as listed and agreed with the findings in the report. No results found.    Orders Placed This Encounter  Procedures   Ambulatory referral to Gastroenterology    Referral Priority:   Routine    Referral Type:   Consultation    Referral Reason:   Specialty Services Required    Number of Visits Requested:   1   All questions were answered. The patient knows to call the clinic with any problems, questions or concerns. No barriers to learning was detected.      Truitt Merle, MD 06/12/2021   I, Wilburn Mylar, am acting as scribe for Truitt Merle, MD.   I have reviewed the above documentation for accuracy and completeness, and I agree with the above.

## 2021-06-23 NOTE — Progress Notes (Deleted)
Cardiology Office Note:    Date:  06/23/2021   ID:  Melinda [name], DOB 1961/05/09, MRN [id number]  PCP:  Crist Infante, MD  [facility] Cardiologist:  Dorris Carnes, MD  [facility] Electrophysiologist:  None   Chief Complaint: yearly follow up   History of Present Illness:    Melinda [name] is a 61 y.o. female with a hx of diabetes mellitus, palpitation, iron deficiency anemia, recurrent squamous cell carcinoma of anus s/p resection and chemo (followed by hematology/oncology) and hyperlipidemia presented for follow-up.  Patient with longstanding history of chest pain with negative work-up in past.  Calcium score of 0 on coronary CTA in 2014.  Stress echocardiogram November 2018 was normal.  Monitor December 2018 with normal sinus rhythm.  No significant bradycardia or pause.   Past Medical History:  Diagnosis Date   anal ca dx'd 07/2019   Diabetes mellitus type 2, controlled ([place])    Dyslipidemia    untreated   GERD (gastroesophageal reflux disease)    Hemorrhoids    Microcytic anemia 06/22/2012   Nephrolith    Palpitation    positive tilt table test. Prozac and Inderal treatment effective.    Past Surgical History:  Procedure Laterality Date   APPENDECTOMY     BREAST REDUCTION SURGERY     CARDIAC CATHETERIZATION  2002   normal LV function and no significant coronary obstruction   CATARACT EXTRACTION, BILATERAL Bilateral november 2020 and dcember 2020   CYST EXCISION N/A 01/11/2020   Procedure: EXCISION OF ANAL CANAL MASS;  Surgeon: Ileana Roup, MD;  [facility];  Service: General;  Laterality: N/A;   CYSTOSCOPY/URETEROSCOPY/HOLMIUM LASER/STENT PLACEMENT Right 03/26/2020   Procedure: CYSTOSCOPY/URETEROSCOPY/HOLMIUM LASER/STENT PLACEMENT;  Surgeon: [name], MD;  [facility];  Service: Urology;  Laterality: Right;   ESOPHAGEAL MANOMETRY N/A 04/04/2017   Procedure: ESOPHAGEAL MANOMETRY (EM);  Surgeon: Laurence Spates, MD;  [facility];   Service: Endoscopy;  Laterality: N/A;   HEMORRHOID SURGERY N/A 07/13/2019   Procedure: HEMORRHOIDECTOMY;  Surgeon: Johnathan Hausen, MD;  Location: [place];  Service: General;  Laterality: N/A;   KIDNEY SURGERY     kidney stones    PARTIAL HYSTERECTOMY     RECTAL EXAM UNDER ANESTHESIA N/A 01/11/2020   Procedure: RECTAL EXAM UNDER ANESTHESIA;  Surgeon: Ileana Roup, MD;  [facility];  Service: General;  Laterality: N/A;   TONSILLECTOMY      Current Medications: No outpatient medications have been marked as taking for the 06/25/21 encounter (Appointment) with Leanor Kail, [place].     Allergies:   Cephalexin, Amoxapine and related, Amoxicillin-pot clavulanate, Atorvastatin, Cefaclor, Ciprofloxacin, Codeine, Doxycycline calcium, Fenofibrate micronized, Fish oil, Flagyl [metronidazole hcl], Levofloxacin in d5w, Metronidazole, Ofloxacin, Prednisolone, Promethazine hcl, Tape, Tetracycline hcl, Erythromycin, and Nitrofurantoin   Social History   Socioeconomic History   Marital status: Married    Spouse name: Not on file   Number of children: 1   Years of education: Not on file   Highest education level: Not on file  Occupational History    Comment: not working now.     Employer: UNEMPLOYED  Tobacco Use   Smoking status: Former    Types: E-cigarettes    Quit date: 07/08/2017    Years since quitting: 3.9   Smokeless tobacco: Never   Tobacco comments:    uses electronic  Vaping Use   Vaping Use: Former   Quit date: 07/03/2016  Substance and Sexual Activity   Alcohol use:  No   Drug use: No   Sexual activity: Yes  Other Topics Concern   Not on file  Social History Narrative   Not on file   Social Determinants of Health   Financial Resource Strain: Not on file  Food Insecurity: Not on file  Transportation Needs: Not on file  Physical Activity: Not on file  Stress: Not on file  Social Connections: Not on file     Family History: The patient's  family history includes Cancer in her father; Cancer (age of onset: 29) in her maternal grandmother; Coronary artery disease in an other family member; Diabetes in an other family member; Heart attack in her father and another family member; Heart disease in her father and mother; Liver disease in her mother.  ***  ROS:   Please see the history of present illness.    All other systems reviewed and are negative. ***  EKGs/Labs/Other Studies Reviewed:    The following studies were reviewed today: Monitor 05/2017 1. NSR 2. Rare PVC's 3. Occasional non-sustained atrial tachycardia 4. Noise artifact is present with signal drop out 5. No significant bradycardia or pauses  Stress echo 04/2017 Study Conclusions   - Left ventricle: Systolic function was normal. The estimated    ejection fraction was in the range of 55% to 60%. Wall motion was    normal; there were no regional wall motion abnormalities.  - Stress ECG conclusions: There were no stress arrhythmias or    conduction abnormalities. The stress ECG was negative for    ischemia.  - Staged echo: There was no echocardiographic evidence for    stress-induced ischemia.   Impressions:   - Stress echocardiogram with no chest pain, no ST changes and no    stress-induced wall motion abnormalities; normal stress    echocardiogram.   EKG:  EKG is *** ordered today.  The ekg ordered today demonstrates ***  Recent Labs: 02/02/2021: Hemoglobin 11.1; Platelet Count 218 06/05/2021: ALT 31; BUN 24; Creatinine 0.86; Potassium 3.9; Sodium 139  Recent Lipid Panel    Component Value Date/Time   CHOL 140 04/09/2020 0535   CHOL 152 05/15/2018 1301   TRIG 278 (H) 04/09/2020 0535   HDL 24 (L) 04/09/2020 0535   HDL 32 (L) 05/15/2018 1301   CHOLHDL 5.8 04/09/2020 0535   VLDL 56 (H) 04/09/2020 0535   LDLCALC 60 04/09/2020 0535   LDLCALC 61 05/15/2018 1301     Risk Assessment/Calculations:   {Does this patient have ATRIAL  FIBRILLATION?:[id number]}   Physical Exam:    VS:  There were no vitals taken for this visit.    Wt Readings from Last 3 Encounters:  06/12/21 163 lb 6.4 oz (74.1 kg)  04/01/21 159 lb (72.1 kg)  02/02/21 158 lb 6.4 oz (71.8 kg)     GEN: *** Well nourished, well developed in no acute distress HEENT: Normal NECK: No JVD; No carotid bruits LYMPHATICS: No lymphadenopathy CARDIAC: ***RRR, no murmurs, rubs, gallops RESPIRATORY:  Clear to auscultation without rales, wheezing or rhonchi  ABDOMEN: Soft, non-tender, non-distended MUSCULOSKELETAL:  No edema; No deformity  SKIN: Warm and dry NEUROLOGIC:  Alert and oriented x 3 PSYCHIATRIC:  Normal affect   ASSESSMENT AND PLAN:    ***  2. ***  Medication Adjustments/Labs and Tests Ordered: Current medicines are reviewed at length with the patient today.  Concerns regarding medicines are outlined above.  No orders of the defined types were placed in this encounter.  No orders of the defined types  were placed in this encounter.   There are no Patient Instructions on file for this visit.   Jarrett Soho, [place]  06/23/2021 6:50 PM    [facility]

## 2021-06-25 ENCOUNTER — Ambulatory Visit: Payer: BC Managed Care – PPO | Admitting: Physician Assistant

## 2021-07-27 DIAGNOSIS — H16141 Punctate keratitis, right eye: Secondary | ICD-10-CM | POA: Diagnosis not present

## 2021-08-18 ENCOUNTER — Other Ambulatory Visit: Payer: Self-pay | Admitting: Internal Medicine

## 2021-08-19 ENCOUNTER — Other Ambulatory Visit: Payer: Self-pay | Admitting: Internal Medicine

## 2021-09-02 ENCOUNTER — Telehealth: Payer: Self-pay | Admitting: Internal Medicine

## 2021-09-02 ENCOUNTER — Other Ambulatory Visit: Payer: Self-pay | Admitting: Internal Medicine

## 2021-09-02 NOTE — Telephone Encounter (Signed)
?*  STAT* If patient is at the pharmacy, call can be transferred to refill team. ? ? ?1. Which medications need to be refilled? (please list name of each medication and dose if known) propranolol (INDERAL) 10 MG tablet ? ?2. Which pharmacy/location (including street and city if local pharmacy) is medication to be sent to? [id number] [place]? ?3. Do they need a 30 day or 90 day supply? 90 day ? ? ?Patient states pharmacy has not received refill. ?

## 2021-09-04 NOTE — Telephone Encounter (Signed)
Pt's medication was sent to pt's pharmacy as requested on 08/19/21 with a 90 day supply. Confirmation received.  ?

## 2021-09-11 ENCOUNTER — Inpatient Hospital Stay: Payer: BC Managed Care – PPO

## 2021-09-11 ENCOUNTER — Inpatient Hospital Stay: Payer: BC Managed Care – PPO | Admitting: Hematology

## 2021-09-28 DIAGNOSIS — Z85048 Personal history of other malignant neoplasm of rectum, rectosigmoid junction, and anus: Secondary | ICD-10-CM | POA: Diagnosis not present

## 2021-09-28 NOTE — Progress Notes (Signed)
? ?Cardiology Office Note ? ? ?Date:  09/30/2021  ? ?ID:  [name], DOB [birth year], MRN [id number] ? ?PCP:  Crist Infante, MD  ?Cardiologist:   Dorris Carnes, MD  ? ?F/U of Chest tightness   ?  ?History of Present Illness: ?Melinda [name] is a 61 y.o. female with a history of CP. Stress test, cath and cardiac CT in past  All negative    ? ?Pt saw B Bhagat in September 2018   Had epigastric discomfort Also had increased palpitations   With and without activity  USN abdomen negative  Emptying study neg  Colon neg  Followed by Dr Oletta Lamas    Recom increased propranolol   ? ? ?I saw her back in Dec 2020  Since seen she was diagnosed with anal cancer   Underwent removal with chemo therapy and radiation   Had kidney stone around same time    ? ? ?I saw the pt in early 2022  Since seen she had undergone repeat Rx of r cancer   ? ?PT says she gets occasional palpitations   Breathing is OK   Denies CP    ?No severe dizziness or syncope ? ?Current Meds  ?Medication Sig  ? clorazepate (TRANXENE) 7.5 MG tablet Take 7.5 mg by mouth 2 (two) times daily.  ? CRESTOR 5 MG tablet Take 5 mg by mouth every evening.   ? docusate sodium (COLACE) 100 MG capsule Take 1 capsule (100 mg total) by mouth 2 (two) times daily.  ? esomeprazole (NEXIUM) 40 MG capsule Take 40 mg by mouth daily at 12 noon.  ? FLUoxetine (PROZAC) 20 MG capsule Take 20 mg by mouth every morning.  ? metFORMIN (GLUCOPHAGE-XR) 500 MG 24 hr tablet Take 500 mg by mouth daily.  ? methenamine (HIPREX) 1 g tablet Take 1 g by mouth daily.   ? neomycin-polymyxin b-dexamethasone (MAXITROL) 3.5-10000-0.1 SUSP Place 1 drop into the right eye 4 (four) times daily.  ? polyethylene glycol powder (GLYCOLAX/MIRALAX) 17 GM/SCOOP powder Take by mouth as needed.  ? propranolol (INDERAL) 10 MG tablet TAKE 1 TABLET BY MOUTH EVERY DAY  ? ? ? ?Allergies:   Cephalexin, Amoxapine and related, Amoxicillin-pot clavulanate, Atorvastatin, Cefaclor, Ciprofloxacin, Codeine, Doxycycline calcium,  Fenofibrate micronized, Fish oil, Flagyl [metronidazole hcl], Levofloxacin in d5w, Metronidazole, Ofloxacin, Prednisolone, Promethazine hcl, Tape, Tetracycline hcl, Erythromycin, and Nitrofurantoin  ? ?Past Medical History:  ?Diagnosis Date  ? anal ca dx'd 07/2019  ? Diabetes mellitus type 2, controlled ([place])   ? Dyslipidemia   ? untreated  ? GERD (gastroesophageal reflux disease)   ? Hemorrhoids   ? Microcytic anemia 06/22/2012  ? Nephrolith   ? Palpitation   ? positive tilt table test. Prozac and Inderal treatment effective.  ? ? ?Past Surgical History:  ?Procedure Laterality Date  ? APPENDECTOMY    ? BREAST REDUCTION SURGERY    ? CARDIAC CATHETERIZATION  2002  ? normal LV function and no significant coronary obstruction  ? CATARACT EXTRACTION, BILATERAL Bilateral november 2020 and dcember 2020  ? CYST EXCISION N/A 01/11/2020  ? Procedure: EXCISION OF ANAL CANAL MASS;  Surgeon: Ileana Roup, MD;  Location: [facility];  Service: General;  Laterality: N/A;  ? CYSTOSCOPY/URETEROSCOPY/HOLMIUM LASER/STENT PLACEMENT Right 03/26/2020  ? Procedure: CYSTOSCOPY/URETEROSCOPY/HOLMIUM LASER/STENT PLACEMENT;  Surgeon: [name], MD;  Location: [facility];  Service: Urology;  Laterality: Right;  ? ESOPHAGEAL MANOMETRY N/A 04/04/2017  ? Procedure: ESOPHAGEAL MANOMETRY (EM);  Surgeon: Laurence Spates, MD;  Location: [facility];  Service: Endoscopy;  Laterality: N/A;  ? HEMORRHOID SURGERY N/A 07/13/2019  ? Procedure: HEMORRHOIDECTOMY;  Surgeon: Johnathan Hausen, MD;  Location: [facility];  Service: General;  Laterality: N/A;  ? KIDNEY SURGERY    ? kidney stones   ? PARTIAL HYSTERECTOMY    ? RECTAL EXAM UNDER ANESTHESIA N/A 01/11/2020  ? Procedure: RECTAL EXAM UNDER ANESTHESIA;  Surgeon: Ileana Roup, MD;  [facility];  Service: General;  Laterality: N/A;  ? TONSILLECTOMY    ? ? ? ?Social History:  The patient  reports that she quit smoking about 4 years ago. Her smoking use included e-cigarettes. She  has never used smokeless tobacco. She reports that she does not drink alcohol and does not use drugs.  ? ?Family History:  The patient's family history includes Cancer in her father; Cancer (age of onset: 70) in her maternal grandmother; Coronary artery disease in an other family member; Diabetes in an other family member; Heart attack in her father and another family member; Heart disease in her father and mother; Liver disease in her mother.  ? ? ?ROS:  Please see the history of present illness. All other systems are reviewed and  Negative to the above problem except as noted.  ? ? ?PHYSICAL EXAM: ?VS:  BP 120/74   Pulse 76   Ht '5\' 6"'$  (1.676 m)   Wt 159 lb (72.1 kg)   SpO2 96%   BMI 25.66 kg/m?   ?GEN: Well nourished, well developed, in no acute distress  ?HEENT: normal  ?Neck: JVP is not elevated    ?Cardiac: RRR; no murmurs, no LE edema  ?Respiratory:  clear to auscultation bilaterally ?GI: soft, nontender, nondistended, + BS  No hepatomegaly  ?MS: no deformity Moving all extremities   ?Skin: warm and dry, no rash ?Neuro:  Strength and sensation are intact ?Psych: euthymic mood, full affect ? ? ?EKG:  EKG shows SR 76 bpm  ?Lipid Panel ?   ?Component Value Date/Time  ? CHOL 140 04/09/2020 0535  ? CHOL 152 05/15/2018 1301  ? TRIG 278 (H) 04/09/2020 0535  ? HDL 24 (L) 04/09/2020 0535  ? HDL 32 (L) 05/15/2018 1301  ? CHOLHDL 5.8 04/09/2020 0535  ? VLDL 56 (H) 04/09/2020 0535  ? [place] 60 04/09/2020 0535  ? [place] 61 05/15/2018 1301  ? ?  ? ?Wt Readings from Last 3 Encounters:  ?09/30/21 159 lb (72.1 kg)  ?06/12/21 163 lb 6.4 oz (74.1 kg)  ?04/01/21 159 lb (72.1 kg)  ?  ? ? ?ASSESSMENT AND PLAN: ? ?1  Palpitations   Pt notes occasional spells   Very short lived   Can take an additional tablet if he has recurrence    ? ?2  CP   Pt denies significant pain  Significant work up in the past was negative   ? ? ?3   Lipid  Pt will see Cena Benton  Will get copy    ? ? ?I will set to see in 1 years time sooner with  problems. ? ?Current medicines are reviewed at length with the patient today.  The patient does not have concerns regarding medicines. ? ?Signed, ?Dorris Carnes, MD  ?09/30/2021 4:24 PM    ?[facility] ?[address], [place], [place]  [id number] ?Phone: [phone]; Fax: [phone]  ? ? ?

## 2021-09-30 ENCOUNTER — Ambulatory Visit (INDEPENDENT_AMBULATORY_CARE_PROVIDER_SITE_OTHER): Payer: BC Managed Care – PPO | Admitting: Internal Medicine

## 2021-09-30 ENCOUNTER — Encounter: Payer: Self-pay | Admitting: Internal Medicine

## 2021-09-30 VITALS — BP 120/74 | HR 76 | Ht 66.0 in | Wt 159.0 lb

## 2021-09-30 DIAGNOSIS — R079 Chest pain, unspecified: Secondary | ICD-10-CM | POA: Diagnosis not present

## 2021-09-30 NOTE — Patient Instructions (Signed)
Medication Instructions:  ? ?*If you need a refill on your cardiac medications before your next appointment, please call your pharmacy* ? ? ?Lab Work: ? ?If you have labs (blood work) drawn today and your tests are completely normal, you will receive your results only by: ?MyChart Message (if you have MyChart) OR ?A paper copy in the mail ?If you have any lab test that is abnormal or we need to change your treatment, we will call you to review the results. ? ? ?Testing/Procedures: ? ? ? ?Follow-Up: ?At [facility], you and your health needs are our priority.  As part of our continuing mission to provide you with exceptional heart care, we have created designated Provider Care Teams.  These Care Teams include your primary Cardiologist (physician) and Advanced Practice Providers (APPs -  Physician Assistants and Nurse Practitioners) who all work together to provide you with the care you need, when you need it. ? ?We recommend signing up for the patient portal called "MyChart".  Sign up information is provided on this After Visit Summary.  MyChart is used to connect with patients for Virtual Visits (Telemedicine).  Patients are able to view lab/test results, encounter notes, upcoming appointments, etc.  Non-urgent messages can be sent to your provider as well.   ?To learn more about what you can do with MyChart, go to [email or web address].   ? ?Your next appointment:   ?1 year(s) ? ?The format for your next appointment:   ?In Person ? ?Provider:   ?Paula Ross, MD   ? ? ?Other Instructions ? ? ?Important Information About Sugar ? ? ? ? ?  ?

## 2021-10-08 DIAGNOSIS — R946 Abnormal results of thyroid function studies: Secondary | ICD-10-CM | POA: Diagnosis not present

## 2021-10-08 DIAGNOSIS — E119 Type 2 diabetes mellitus without complications: Secondary | ICD-10-CM | POA: Diagnosis not present

## 2021-10-08 DIAGNOSIS — M810 Age-related osteoporosis without current pathological fracture: Secondary | ICD-10-CM | POA: Diagnosis not present

## 2021-10-15 DIAGNOSIS — Z1331 Encounter for screening for depression: Secondary | ICD-10-CM | POA: Diagnosis not present

## 2021-10-15 DIAGNOSIS — Z23 Encounter for immunization: Secondary | ICD-10-CM | POA: Diagnosis not present

## 2021-10-15 DIAGNOSIS — C211 Malignant neoplasm of anal canal: Secondary | ICD-10-CM | POA: Diagnosis not present

## 2021-10-15 DIAGNOSIS — Z1389 Encounter for screening for other disorder: Secondary | ICD-10-CM | POA: Diagnosis not present

## 2021-10-15 DIAGNOSIS — Z0000 Encounter for general adult medical examination without abnormal findings: Secondary | ICD-10-CM | POA: Diagnosis not present

## 2021-10-15 DIAGNOSIS — R82998 Other abnormal findings in urine: Secondary | ICD-10-CM | POA: Diagnosis not present

## 2021-10-15 DIAGNOSIS — D649 Anemia, unspecified: Secondary | ICD-10-CM | POA: Diagnosis not present

## 2021-10-19 ENCOUNTER — Other Ambulatory Visit: Payer: Self-pay | Admitting: Internal Medicine

## 2021-10-19 DIAGNOSIS — E785 Hyperlipidemia, unspecified: Secondary | ICD-10-CM

## 2021-10-20 ENCOUNTER — Other Ambulatory Visit: Payer: Self-pay

## 2021-10-20 DIAGNOSIS — D649 Anemia, unspecified: Secondary | ICD-10-CM | POA: Insufficient documentation

## 2021-10-21 ENCOUNTER — Telehealth: Payer: Self-pay | Admitting: Pharmacy Technician

## 2021-10-21 NOTE — Telephone Encounter (Addendum)
Auth Submission: approved Payer: [employer] Medication & CPT/J Code(s) submitted: Feraheme (ferumoxytol) [id number] Route of submission (phone, fax, portal): phone Auth type: Buy/Bill Units/visits requested: x2 doses Reference number: [id number] Phone: [phone] Fax: [phone] Approval from: 10/21/21  to  01/19/22

## 2021-10-22 ENCOUNTER — Encounter: Payer: Self-pay | Admitting: Pulmonary Disease

## 2021-10-22 ENCOUNTER — Encounter: Payer: Self-pay | Admitting: Hematology

## 2021-10-28 ENCOUNTER — Ambulatory Visit (INDEPENDENT_AMBULATORY_CARE_PROVIDER_SITE_OTHER): Payer: BC Managed Care – PPO

## 2021-10-28 VITALS — BP 110/69 | HR 69 | Temp 97.8°F | Resp 18 | Ht 66.0 in | Wt 153.8 lb

## 2021-10-28 DIAGNOSIS — D649 Anemia, unspecified: Secondary | ICD-10-CM | POA: Diagnosis not present

## 2021-10-28 MED ORDER — SODIUM CHLORIDE 0.9 % IV SOLN
510.0000 mg | Freq: Once | INTRAVENOUS | Status: AC
  Administered 2021-10-28: 510 mg via INTRAVENOUS
  Filled 2021-10-28: qty 17

## 2021-10-28 NOTE — Progress Notes (Signed)
Diagnosis: Iron Deficiency Anemia  Provider:  Marshell Garfinkel, MD  Procedure: Infusion  IV Type: Peripheral, IV Location: L Antecubital  Feraheme (Ferumoxytol), Dose: 510 mg  Infusion Start Time: 1330  Infusion Stop Time: 5284  Post Infusion IV Care: Observation period completed and Peripheral IV Discontinued  Discharge: Condition: Good, Destination: Home . AVS provided to patient.   Performed by:  Cleophus Molt, RN

## 2021-11-04 ENCOUNTER — Ambulatory Visit (INDEPENDENT_AMBULATORY_CARE_PROVIDER_SITE_OTHER): Payer: BC Managed Care – PPO

## 2021-11-04 VITALS — BP 103/66 | HR 61 | Temp 97.8°F | Resp 18 | Ht 66.0 in | Wt 155.6 lb

## 2021-11-04 DIAGNOSIS — D649 Anemia, unspecified: Secondary | ICD-10-CM | POA: Diagnosis not present

## 2021-11-04 MED ORDER — SODIUM CHLORIDE 0.9 % IV SOLN
510.0000 mg | Freq: Once | INTRAVENOUS | Status: AC
  Administered 2021-11-04: 510 mg via INTRAVENOUS
  Filled 2021-11-04: qty 17

## 2021-11-04 NOTE — Progress Notes (Signed)
Diagnosis: Iron Deficiency Anemia  Provider:  Marshell Garfinkel, MD  Procedure: Infusion  IV Type: Peripheral, IV Location: L Antecubital  Feraheme (Ferumoxytol), Dose: 510 mg  Infusion Start Time: 1540  Infusion Stop Time: 0867  Post Infusion IV Care: Peripheral IV Discontinued  Discharge: Condition: Good, Destination: Home . AVS provided to patient.   Performed by:  Adelina Mings, LPN

## 2021-11-18 DIAGNOSIS — F3341 Major depressive disorder, recurrent, in partial remission: Secondary | ICD-10-CM | POA: Diagnosis not present

## 2021-11-27 ENCOUNTER — Other Ambulatory Visit: Payer: Self-pay | Admitting: Internal Medicine

## 2021-12-01 DIAGNOSIS — M5137 Other intervertebral disc degeneration, lumbosacral region: Secondary | ICD-10-CM | POA: Diagnosis not present

## 2021-12-01 DIAGNOSIS — M9903 Segmental and somatic dysfunction of lumbar region: Secondary | ICD-10-CM | POA: Diagnosis not present

## 2021-12-01 DIAGNOSIS — M9901 Segmental and somatic dysfunction of cervical region: Secondary | ICD-10-CM | POA: Diagnosis not present

## 2021-12-01 DIAGNOSIS — M5136 Other intervertebral disc degeneration, lumbar region: Secondary | ICD-10-CM | POA: Diagnosis not present

## 2021-12-11 ENCOUNTER — Other Ambulatory Visit: Payer: Self-pay

## 2021-12-11 ENCOUNTER — Encounter: Payer: Self-pay | Admitting: Hematology

## 2021-12-11 ENCOUNTER — Inpatient Hospital Stay: Payer: BC Managed Care – PPO | Attending: Hematology

## 2021-12-11 ENCOUNTER — Inpatient Hospital Stay (HOSPITAL_BASED_OUTPATIENT_CLINIC_OR_DEPARTMENT_OTHER): Payer: BC Managed Care – PPO | Admitting: Hematology

## 2021-12-11 VITALS — BP 122/68 | HR 71 | Temp 97.8°F | Resp 18 | Ht 66.0 in | Wt 150.4 lb

## 2021-12-11 DIAGNOSIS — D509 Iron deficiency anemia, unspecified: Secondary | ICD-10-CM | POA: Diagnosis not present

## 2021-12-11 DIAGNOSIS — Z79899 Other long term (current) drug therapy: Secondary | ICD-10-CM | POA: Diagnosis not present

## 2021-12-11 DIAGNOSIS — C210 Malignant neoplasm of anus, unspecified: Secondary | ICD-10-CM

## 2021-12-11 DIAGNOSIS — D701 Agranulocytosis secondary to cancer chemotherapy: Secondary | ICD-10-CM | POA: Insufficient documentation

## 2021-12-11 DIAGNOSIS — D61810 Antineoplastic chemotherapy induced pancytopenia: Secondary | ICD-10-CM | POA: Insufficient documentation

## 2021-12-11 DIAGNOSIS — T451X5D Adverse effect of antineoplastic and immunosuppressive drugs, subsequent encounter: Secondary | ICD-10-CM | POA: Diagnosis not present

## 2021-12-11 DIAGNOSIS — E119 Type 2 diabetes mellitus without complications: Secondary | ICD-10-CM | POA: Insufficient documentation

## 2021-12-11 LAB — CMP (CANCER CENTER ONLY)
ALT: 21 U/L (ref 0–44)
AST: 19 U/L (ref 15–41)
Albumin: 4.5 g/dL (ref 3.5–5.0)
Alkaline Phosphatase: 54 U/L (ref 38–126)
Anion gap: 4 — ABNORMAL LOW (ref 5–15)
BUN: 23 mg/dL (ref 8–23)
CO2: 28 mmol/L (ref 22–32)
Calcium: 10.1 mg/dL (ref 8.9–10.3)
Chloride: 108 mmol/L (ref 98–111)
Creatinine: 0.88 mg/dL (ref 0.44–1.00)
GFR, Estimated: >60 mL/min (ref >60)
Glucose, Bld: 122 mg/dL — ABNORMAL HIGH (ref 70–99)
Potassium: 4.5 mmol/L (ref 3.5–5.1)
Sodium: 140 mmol/L (ref 135–145)
Total Bilirubin: 0.4 mg/dL (ref 0.3–1.2)
Total Protein: 7.2 g/dL (ref 6.5–8.1)

## 2021-12-11 LAB — CBC WITH DIFFERENTIAL (CANCER CENTER ONLY)
Abs Immature Granulocytes: 0.01 10*3/uL (ref 0.00–0.07)
Basophils Absolute: 0 10*3/uL (ref 0.0–0.1)
Basophils Relative: 1 %
Eosinophils Absolute: 0.1 10*3/uL (ref 0.0–0.5)
Eosinophils Relative: 2 %
HCT: 35.6 % — ABNORMAL LOW (ref 36.0–46.0)
Hemoglobin: 11.8 g/dL — ABNORMAL LOW (ref 12.0–15.0)
Immature Granulocytes: 0 %
Lymphocytes Relative: 27 %
Lymphs Abs: 1 10*3/uL (ref 0.7–4.0)
MCH: 27.6 pg (ref 26.0–34.0)
MCHC: 33.1 g/dL (ref 30.0–36.0)
MCV: 83.4 fL (ref 80.0–100.0)
Monocytes Absolute: 0.2 10*3/uL (ref 0.1–1.0)
Monocytes Relative: 7 %
Neutro Abs: 2.2 10*3/uL (ref 1.7–7.7)
Neutrophils Relative %: 63 %
Platelet Count: 192 10*3/uL (ref 150–400)
RBC: 4.27 MIL/uL (ref 3.87–5.11)
RDW: 16.4 % — ABNORMAL HIGH (ref 11.5–15.5)
WBC Count: 3.6 10*3/uL — ABNORMAL LOW (ref 4.0–10.5)
nRBC: 0 % (ref 0.0–0.2)

## 2021-12-11 LAB — IRON AND IRON BINDING CAPACITY (CC-WL,HP ONLY)
Iron: 96 ug/dL (ref 28–170)
Saturation Ratios: 27 % (ref 10.4–31.8)
TIBC: 351 ug/dL (ref 250–450)
UIBC: 255 ug/dL (ref 148–442)

## 2021-12-11 LAB — FERRITIN: Ferritin: 115 ng/mL (ref 11–307)

## 2021-12-11 NOTE — Progress Notes (Signed)
Melinda [name]   [phone] [phone]   Clinic Follow up Note   Patient Care Team: Crist Infante, MD as PCP - General (Internal Medicine) Fay Records, MD as PCP - Cardiology (Cardiology) Nobie Putnam, MD (Hematology and Oncology) Heilingoetter, Tobe Sos, PA-C as Physician Assistant (Oncology) Truitt Merle, MD as Consulting Physician (Oncology) Jonnie Finner, RN (Inactive) as Oncology Nurse Navigator Ileana Roup, MD as Consulting Physician (General Surgery)  Date of Service:  12/11/2021  CHIEF COMPLAINT: f/u of anal cancer  CURRENT THERAPY:  Surveillance  ASSESSMENT & PLAN:  Melinda [name] is a 61 y.o. female with   1. Recurrent squamous cell carcinoma the anus, pT1, cN0, cM0  -presented to Dr Hassell Done for a hemorrhoidectomy and EUA on 07/13/19, pathology revealed invasive moderately differentiated squamous cell carcinoma, 1.4 cm, pT1. Staging CT 09/04/19 was negative. -She had local recurrence and underwent surgical excision under the care of Dr. Dema Severin on 01/11/20. -PET 01/28/20 showed no node or distant metastasis. -She was only able to complete 5 out of the planned 6 weeks of the standard concurrent chemoRT with Mitomycin and 5FU on due to poor tolerance of local skin irritation, N/V/D and had prolonged recovery -CT CAP on 06/05/21 showed NED. -most recent anoscopy with Dr. Dema Severin on 09/28/21 was benign. -She is clinically doing well. Labs reviewed, overall stable. Physical exam was unremarkable. There is no clinical concern for recurrence. -Continue surveillance. Plan for restaging CT in 6 months. Due to vomiting from the oral contrast, I will not give oral contrast    2. Iron deficiency anemia -Etiology unclear as to her iron deficiency. S/P hysterectomy, endo/colonoscopies negative -she receives IV Feraheme PRN, last 11/04/21 -she continues on oral iron -hgb stable at 11.8 today (12/11/21)   4. DM, Health Maintenance  -Continue to f/u  with PCP for management -I encouraged her to do yearly mammograms and continue Pap smear if she has cervix s/p hysterectomy.  -she is overdue for colonoscopy but is awaiting response from [facility].   5. Neutropenia -Secondary to chemo, overall mild and improved -mild and stable, WBC 3.6 today (12/11/21)     PLAN:  -f/u with Dr. Dema Severin in 3 months -f/u in 6 months with lab and CT several days before   No problem-specific Assessment & Plan notes found for this encounter.   SUMMARY OF ONCOLOGIC HISTORY: Oncology History Overview Note  Cancer Staging No matching staging information was found for the patient.    Squamous cell cancer, anus ([place])  07/13/2019 Procedure    EUA, Excision of posterior internal/external hemorrhoid under the care of Dr. Hassell Done    07/13/2019 Pathology Results   - Invasive moderately differentiated squamous cell carcinoma, 1.4 cm.  See comment  - Carcinoma invades for depth of 0.4 cm  - Deep resection margin is negative for carcinoma (0.2 cm)  - Lateral mucosal margin is positive for high-grade dysplasia  - No evidence of lymphovascular perineural invasion  Procedure: Local excision  Tumor Site: Anal canal  Tumor Size: 1.4 cm  Histologic Type: Invasive squamous cell carcinoma  Histologic Grade: G2: Moderately differentiated  Tumor Extension: Carcinoma invades superficial anal sphincter muscle  Margins: Uninvolved by tumor  Treatment Effect: N/A  Regional Lymph Nodes: No lymph nodes submitted or found  Pathologic Stage Classification (pTNM, AJCC 8th Edition):  pT1, pNx  Representative Tumor Block: A1  Comment(s): Lateral mucosal margin is positive for high-grade squamous  dysplasia      07/13/2019 Initial Diagnosis  Squamous cell cancer, anus ([place])   09/04/2019 Imaging   CT scan Chest, Abdomen, and Pelvis   IMPRESSION: 1. No evidence of metastatic disease in the chest, abdomen or pelvis. 2. No discrete anorectal mass. 3. Chronic findings include:  Punctate nonobstructing upper right renal stones and chronic right renal scarring. 4. Aortic Atherosclerosis (ICD10-I70.0).   01/10/2020 Pathology Results   A. ANAL LESION, POSTERIOR MIDLINE, EXCISION:  - Squamous cell carcinoma, moderately differentiated.  Verbally reported by Dr. Saralyn Pilar 1.5 cm, negative margins, depth <1 mm   01/11/2020 Procedure   1. Excision of anal canal lesion (posterior midline) under the care of Dr. Dema Severin     01/28/2020 PET scan   IMPRESSION: 1. Mild focal anal hypermetabolism without discrete mass correlate on the CT images, nonspecific, differential includes postsurgical change or residual anal tumor. 2. No hypermetabolic locoregional or distant metastatic disease. 3. Chronic findings include: Aortic Atherosclerosis (ICD10-I70.0). Diffuse hepatic steatosis. Nonobstructing right nephrolithiasis.     02/04/2020 - 03/07/2020 Radiation Therapy   concurrent chemoRT by Dr Lisbeth Renshaw with Mitomycin and 5FU starting 02/04/20-03/07/20. The last 4 session cancelled due to poor toleration.    02/04/2020 - 03/03/2020 Chemotherapy   Concurrent chemoRT with Mitomycin and 5FU on week 1 and 5 starting 02/04/20-03/03/20   03/13/2020 Imaging   CT AP W contrast  IMPRESSION: Diffuse wall thickening with inflammatory changes involving a long segment of distal ileum, cecum, and sigmoid colon likely represents enteritis/colitis. This could be due to infectious, or inflammatory nature, not thought to represent ischemic colitis.   Hepatic steatosis   Trace pericardial effusion   Small free fluid in the deep pelvis   06/04/2020 Imaging   PET IMPRESSION: 1. Diminished metabolic uptake about the rectum with presumed post radiation changes showing mild perirectal stranding and perianal stranding on today's exam. Continued correlation with direct visualization may be helpful to exclude any residual tumor in this location but diminished FDG uptake is encouraging and findings  could certainly be seen in the setting of post treatment change. 2. No signs of metastatic disease to the neck, chest, abdomen or pelvis. 3. Diminished perienteric stranding about distal small bowel with persistent mild stranding and stool like material in distal small bowel likely related to enteritis and associated with mild but diminished FDG uptake. Attention on follow-up. 4. Resolution of RIGHT-sided hydronephrosis with signs of nephrolithiasis as before. 5. Mild asymmetry of metabolic activity along the RIGHT chest wall associated serrated S musculature adjacent to the tip of the scapula felt to represent asymmetric muscular activity. Correlate with any symptoms in this area and suggest attention on follow-up.   Aortic Atherosclerosis (ICD10-I70.0).   06/05/2021 Imaging   EXAM: CT CHEST, ABDOMEN, AND PELVIS WITH CONTRAST  IMPRESSION: 1. No new or progressive findings to suggest metastatic disease in the chest, abdomen, or pelvis. 2. The wall thickening and perienteric edema associated with the terminal ileum on prior studies has resolved completely. Presacral and perirectal edema/stranding seen previously has also resolved. 3. Tiny bilateral pulmonary nodules, stable since prior PET-CT, consistent with benign etiology. 4. Right renal cortical scarring with tiny nonobstructing stones. 5. Aortic Atherosclerosis (ICD10-I70.0).      INTERVAL HISTORY:  Melinda [name] is here for a follow up of anal cancer. She was last seen by me on 06/12/21. She presents to the clinic accompanied by her husband. She reports she is doing well overall, no new concerns.   All other systems were reviewed with the patient and are negative.  MEDICAL HISTORY:  Past Medical History:  Diagnosis Date   anal ca dx'd 07/2019   Diabetes mellitus type 2, controlled ([place])    Dyslipidemia    untreated   GERD (gastroesophageal reflux disease)    Hemorrhoids    Microcytic anemia 06/22/2012    Nephrolith    Palpitation    positive tilt table test. Prozac and Inderal treatment effective.    SURGICAL HISTORY: Past Surgical History:  Procedure Laterality Date   APPENDECTOMY     BREAST REDUCTION SURGERY     CARDIAC CATHETERIZATION  2002   normal LV function and no significant coronary obstruction   CATARACT EXTRACTION, BILATERAL Bilateral november 2020 and dcember 2020   CYST EXCISION N/A 01/11/2020   Procedure: EXCISION OF ANAL CANAL MASS;  Surgeon: Ileana Roup, MD;  [facility];  Service: General;  Laterality: N/A;   CYSTOSCOPY/URETEROSCOPY/HOLMIUM LASER/STENT PLACEMENT Right 03/26/2020   Procedure: CYSTOSCOPY/URETEROSCOPY/HOLMIUM LASER/STENT PLACEMENT;  Surgeon: [name], MD;  [facility];  Service: Urology;  Laterality: Right;   ESOPHAGEAL MANOMETRY N/A 04/04/2017   Procedure: ESOPHAGEAL MANOMETRY (EM);  Surgeon: Laurence Spates, MD;  [facility];  Service: Endoscopy;  Laterality: N/A;   HEMORRHOID SURGERY N/A 07/13/2019   Procedure: HEMORRHOIDECTOMY;  Surgeon: Johnathan Hausen, MD;  Location: [facility];  Service: General;  Laterality: N/A;   KIDNEY SURGERY     kidney stones    PARTIAL HYSTERECTOMY     RECTAL EXAM UNDER ANESTHESIA N/A 01/11/2020   Procedure: RECTAL EXAM UNDER ANESTHESIA;  Surgeon: Ileana Roup, MD;  [facility];  Service: General;  Laterality: N/A;   TONSILLECTOMY      I have reviewed the social history and family history with the patient and they are unchanged from previous note.  ALLERGIES:  is allergic to cephalexin, amoxapine and related, amoxicillin-pot clavulanate, atorvastatin, cefaclor, ciprofloxacin, codeine, doxycycline calcium, fenofibrate micronized, fish oil, flagyl [metronidazole hcl], levofloxacin in d5w, metronidazole, ofloxacin, prednisolone, promethazine hcl, tape, tetracycline hcl, erythromycin, and nitrofurantoin.  MEDICATIONS:  Current Outpatient Medications  Medication  Sig Dispense Refill   clorazepate (TRANXENE) 7.5 MG tablet Take 7.5 mg by mouth 2 (two) times daily.  1   CRESTOR 5 MG tablet Take 5 mg by mouth every evening.   1   docusate sodium (COLACE) 100 MG capsule Take 1 capsule (100 mg total) by mouth 2 (two) times daily. 30 capsule 0   esomeprazole (NEXIUM) 40 MG capsule Take 40 mg by mouth daily at 12 noon.     FLUoxetine (PROZAC) 20 MG capsule Take 20 mg by mouth every morning.     metFORMIN (GLUCOPHAGE-XR) 500 MG 24 hr tablet Take 500 mg by mouth daily.     methenamine (HIPREX) 1 g tablet Take 1 g by mouth daily.      neomycin-polymyxin b-dexamethasone (MAXITROL) 3.5-10000-0.1 SUSP Place 1 drop into the right eye 4 (four) times daily.     polyethylene glycol powder (GLYCOLAX/MIRALAX) 17 GM/SCOOP powder Take by mouth as needed.     propranolol (INDERAL) 10 MG tablet TAKE 1 TABLET BY MOUTH EVERY DAY 90 tablet 3   No current facility-administered medications for this visit.    PHYSICAL EXAMINATION: ECOG PERFORMANCE STATUS: 0 - Asymptomatic  Vitals:   12/11/21 1323  BP: 122/68  Pulse: 71  Resp: 18  Temp: 97.8 F (36.6 C)  SpO2: 98%   Wt Readings from Last 3 Encounters:  12/11/21 150 lb 6.4 oz (68.2 kg)  11/04/21 155 lb 9.6 oz (70.6 kg)  10/28/21 153 lb 12.8 oz (69.8 kg)     GENERAL:alert, no distress and comfortable SKIN: skin color, texture, turgor are normal, no rashes or significant lesions EYES: normal, Conjunctiva are pink and non-injected, sclera clear  NECK: supple, thyroid normal size, non-tender, without nodularity LYMPH:  no palpable lymphadenopathy in the cervical, axillary or inguinal LUNGS: clear to auscultation and percussion with normal breathing effort HEART: regular rate & rhythm and no murmurs and no lower extremity edema ABDOMEN:abdomen soft, non-tender and normal bowel sounds Musculoskeletal:no cyanosis of digits and no clubbing  NEURO: alert & oriented x 3 with fluent speech, no focal motor/sensory  deficits RECTAL: No palpable mass. No blood on glove. Normal stool present. Benign exam    LABORATORY DATA:  I have reviewed the data as listed    Latest Ref Rng & Units 12/11/2021   12:58 PM 02/02/2021   12:38 PM 10/08/2020    1:16 PM  CBC  WBC 4.0 - 10.5 K/uL 3.6  3.7  3.2   Hemoglobin 12.0 - 15.0 g/dL 11.8  11.1  11.8   Hematocrit 36.0 - 46.0 % 35.6  34.9  36.3   Platelets 150 - 400 K/uL 192  218  184         Latest Ref Rng & Units 12/11/2021   12:58 PM 06/05/2021   12:23 PM 02/02/2021   12:38 PM  CMP  Glucose 70 - 99 mg/dL 122  148  200   BUN 8 - 23 mg/dL '23  24  21   '$ Creatinine 0.44 - 1.00 mg/dL 0.88  0.86  1.03   Sodium 135 - 145 mmol/L 140  139  142   Potassium 3.5 - 5.1 mmol/L 4.5  3.9  4.1   Chloride 98 - 111 mmol/L 108  106  107   CO2 22 - 32 mmol/L '28  27  26   '$ Calcium 8.9 - 10.3 mg/dL 10.1  9.7  9.5   Total Protein 6.5 - 8.1 g/dL 7.2  6.8  7.0   Total Bilirubin 0.3 - 1.2 mg/dL 0.4  0.3  0.4   Alkaline Phos 38 - 126 U/L 54  53  71   AST 15 - 41 U/L '19  21  17   '$ ALT 0 - 44 U/L '21  31  21       '$ RADIOGRAPHIC STUDIES: I have personally reviewed the radiological images as listed and agreed with the findings in the report. No results found.    Orders Placed This Encounter  Procedures   CT CHEST ABDOMEN PELVIS W CONTRAST    Standing Status:   Future    Standing Expiration Date:   12/12/2022    Order Specific Question:   Preferred imaging location?    Answer:   [facility]    Order Specific Question:   Is Oral Contrast requested for this exam?    Answer:   No oral contrast    Order Specific Question:   Reason for No Oral Contrast    Answer:   Other    Order Specific Question:   Please answer why no oral contrast is requested    Answer:   poor tolerance   All questions were answered. The patient knows to call the clinic with any problems, questions or concerns. No barriers to learning was detected. The total time spent in the appointment was 30  minutes.     Truitt Merle, MD 12/11/2021   I, Wilburn Mylar, am acting as scribe for  Truitt Merle, MD.   I have reviewed the above documentation for accuracy and completeness, and I agree with the above.

## 2021-12-14 ENCOUNTER — Encounter: Payer: Self-pay | Admitting: Hematology

## 2021-12-14 ENCOUNTER — Ambulatory Visit
Admission: RE | Admit: 2021-12-14 | Discharge: 2021-12-14 | Disposition: A | Discharge status: Home / Self Care | Payer: No Typology Code available for payment source | Source: Ambulatory Visit | Attending: Internal Medicine | Admitting: Internal Medicine

## 2021-12-14 ENCOUNTER — Encounter: Payer: Self-pay | Admitting: Pulmonary Disease

## 2021-12-14 DIAGNOSIS — E785 Hyperlipidemia, unspecified: Secondary | ICD-10-CM

## 2022-01-04 DIAGNOSIS — M5137 Other intervertebral disc degeneration, lumbosacral region: Secondary | ICD-10-CM | POA: Diagnosis not present

## 2022-01-04 DIAGNOSIS — M5136 Other intervertebral disc degeneration, lumbar region: Secondary | ICD-10-CM | POA: Diagnosis not present

## 2022-01-04 DIAGNOSIS — M9901 Segmental and somatic dysfunction of cervical region: Secondary | ICD-10-CM | POA: Diagnosis not present

## 2022-01-04 DIAGNOSIS — M9903 Segmental and somatic dysfunction of lumbar region: Secondary | ICD-10-CM | POA: Diagnosis not present

## 2022-01-05 DIAGNOSIS — R42 Dizziness and giddiness: Secondary | ICD-10-CM | POA: Diagnosis not present

## 2022-01-06 DIAGNOSIS — M9903 Segmental and somatic dysfunction of lumbar region: Secondary | ICD-10-CM | POA: Diagnosis not present

## 2022-01-06 DIAGNOSIS — M5136 Other intervertebral disc degeneration, lumbar region: Secondary | ICD-10-CM | POA: Diagnosis not present

## 2022-01-06 DIAGNOSIS — M5137 Other intervertebral disc degeneration, lumbosacral region: Secondary | ICD-10-CM | POA: Diagnosis not present

## 2022-01-06 DIAGNOSIS — M9901 Segmental and somatic dysfunction of cervical region: Secondary | ICD-10-CM | POA: Diagnosis not present

## 2022-01-11 DIAGNOSIS — M9901 Segmental and somatic dysfunction of cervical region: Secondary | ICD-10-CM | POA: Diagnosis not present

## 2022-01-11 DIAGNOSIS — M5136 Other intervertebral disc degeneration, lumbar region: Secondary | ICD-10-CM | POA: Diagnosis not present

## 2022-01-11 DIAGNOSIS — M9903 Segmental and somatic dysfunction of lumbar region: Secondary | ICD-10-CM | POA: Diagnosis not present

## 2022-01-11 DIAGNOSIS — M5137 Other intervertebral disc degeneration, lumbosacral region: Secondary | ICD-10-CM | POA: Diagnosis not present

## 2022-01-13 DIAGNOSIS — M5137 Other intervertebral disc degeneration, lumbosacral region: Secondary | ICD-10-CM | POA: Diagnosis not present

## 2022-01-13 DIAGNOSIS — M9901 Segmental and somatic dysfunction of cervical region: Secondary | ICD-10-CM | POA: Diagnosis not present

## 2022-01-13 DIAGNOSIS — M9903 Segmental and somatic dysfunction of lumbar region: Secondary | ICD-10-CM | POA: Diagnosis not present

## 2022-01-13 DIAGNOSIS — M5136 Other intervertebral disc degeneration, lumbar region: Secondary | ICD-10-CM | POA: Diagnosis not present

## 2022-01-25 DIAGNOSIS — M9903 Segmental and somatic dysfunction of lumbar region: Secondary | ICD-10-CM | POA: Diagnosis not present

## 2022-01-25 DIAGNOSIS — M5136 Other intervertebral disc degeneration, lumbar region: Secondary | ICD-10-CM | POA: Diagnosis not present

## 2022-01-25 DIAGNOSIS — M5137 Other intervertebral disc degeneration, lumbosacral region: Secondary | ICD-10-CM | POA: Diagnosis not present

## 2022-01-25 DIAGNOSIS — M9901 Segmental and somatic dysfunction of cervical region: Secondary | ICD-10-CM | POA: Diagnosis not present

## 2022-02-03 DIAGNOSIS — M9901 Segmental and somatic dysfunction of cervical region: Secondary | ICD-10-CM | POA: Diagnosis not present

## 2022-02-03 DIAGNOSIS — M9903 Segmental and somatic dysfunction of lumbar region: Secondary | ICD-10-CM | POA: Diagnosis not present

## 2022-02-03 DIAGNOSIS — M5137 Other intervertebral disc degeneration, lumbosacral region: Secondary | ICD-10-CM | POA: Diagnosis not present

## 2022-02-03 DIAGNOSIS — M5136 Other intervertebral disc degeneration, lumbar region: Secondary | ICD-10-CM | POA: Diagnosis not present

## 2022-02-16 DIAGNOSIS — M5136 Other intervertebral disc degeneration, lumbar region: Secondary | ICD-10-CM | POA: Diagnosis not present

## 2022-02-16 DIAGNOSIS — M9901 Segmental and somatic dysfunction of cervical region: Secondary | ICD-10-CM | POA: Diagnosis not present

## 2022-02-16 DIAGNOSIS — M5137 Other intervertebral disc degeneration, lumbosacral region: Secondary | ICD-10-CM | POA: Diagnosis not present

## 2022-02-16 DIAGNOSIS — M9903 Segmental and somatic dysfunction of lumbar region: Secondary | ICD-10-CM | POA: Diagnosis not present

## 2022-03-02 DIAGNOSIS — M5137 Other intervertebral disc degeneration, lumbosacral region: Secondary | ICD-10-CM | POA: Diagnosis not present

## 2022-03-02 DIAGNOSIS — M9903 Segmental and somatic dysfunction of lumbar region: Secondary | ICD-10-CM | POA: Diagnosis not present

## 2022-03-02 DIAGNOSIS — M5136 Other intervertebral disc degeneration, lumbar region: Secondary | ICD-10-CM | POA: Diagnosis not present

## 2022-03-02 DIAGNOSIS — M9901 Segmental and somatic dysfunction of cervical region: Secondary | ICD-10-CM | POA: Diagnosis not present

## 2022-03-16 DIAGNOSIS — M9903 Segmental and somatic dysfunction of lumbar region: Secondary | ICD-10-CM | POA: Diagnosis not present

## 2022-03-16 DIAGNOSIS — M5137 Other intervertebral disc degeneration, lumbosacral region: Secondary | ICD-10-CM | POA: Diagnosis not present

## 2022-03-16 DIAGNOSIS — M5136 Other intervertebral disc degeneration, lumbar region: Secondary | ICD-10-CM | POA: Diagnosis not present

## 2022-03-16 DIAGNOSIS — M9901 Segmental and somatic dysfunction of cervical region: Secondary | ICD-10-CM | POA: Diagnosis not present

## 2022-03-26 ENCOUNTER — Ambulatory Visit (HOSPITAL_COMMUNITY)
Admission: RE | Admit: 2022-03-26 | Discharge: 2022-03-26 | Disposition: A | Discharge status: Home / Self Care | Payer: BC Managed Care – PPO | Source: Ambulatory Visit | Attending: Hematology | Admitting: Hematology

## 2022-03-26 DIAGNOSIS — E119 Type 2 diabetes mellitus without complications: Secondary | ICD-10-CM | POA: Insufficient documentation

## 2022-03-26 DIAGNOSIS — C210 Malignant neoplasm of anus, unspecified: Secondary | ICD-10-CM | POA: Diagnosis not present

## 2022-03-26 DIAGNOSIS — Z90710 Acquired absence of both cervix and uterus: Secondary | ICD-10-CM | POA: Diagnosis not present

## 2022-03-26 DIAGNOSIS — I700 Atherosclerosis of aorta: Secondary | ICD-10-CM | POA: Diagnosis not present

## 2022-03-26 DIAGNOSIS — R918 Other nonspecific abnormal finding of lung field: Secondary | ICD-10-CM | POA: Diagnosis not present

## 2022-03-26 LAB — POCT I-STAT CREATININE: Creatinine, Ser: 0.9 mg/dL (ref 0.44–1.00)

## 2022-03-26 MED ORDER — SODIUM CHLORIDE (PF) 0.9 % IJ SOLN
INTRAMUSCULAR | Status: AC
  Filled 2022-03-26: qty 50

## 2022-03-26 MED ORDER — IOHEXOL 300 MG/ML  SOLN
100.0000 mL | Freq: Once | INTRAMUSCULAR | Status: AC | PRN
  Administered 2022-03-26: 100 mL via INTRAVENOUS

## 2022-03-30 DIAGNOSIS — Z85048 Personal history of other malignant neoplasm of rectum, rectosigmoid junction, and anus: Secondary | ICD-10-CM | POA: Diagnosis not present

## 2022-03-31 DIAGNOSIS — M9903 Segmental and somatic dysfunction of lumbar region: Secondary | ICD-10-CM | POA: Diagnosis not present

## 2022-03-31 DIAGNOSIS — M5137 Other intervertebral disc degeneration, lumbosacral region: Secondary | ICD-10-CM | POA: Diagnosis not present

## 2022-03-31 DIAGNOSIS — M9901 Segmental and somatic dysfunction of cervical region: Secondary | ICD-10-CM | POA: Diagnosis not present

## 2022-03-31 DIAGNOSIS — M5136 Other intervertebral disc degeneration, lumbar region: Secondary | ICD-10-CM | POA: Diagnosis not present

## 2022-04-01 ENCOUNTER — Telehealth: Payer: Self-pay | Admitting: Gastroenterology

## 2022-04-01 NOTE — Telephone Encounter (Signed)
Good Afternoon Dr Elvera Maria  We have received a request from patient to transfer her care to you due to her husband being a patient of yours as well.  Patient was last seen in 2020 for a procedure with [facility] with Dr Earlean Shawl, who has retired from the practice.  Patient has a referral in to be seen for history of polyps and history of anal cancer. Patients records are available for review in [facility]. Please review and advise on scheduling.  Thank you

## 2022-04-05 DIAGNOSIS — R300 Dysuria: Secondary | ICD-10-CM | POA: Diagnosis not present

## 2022-04-13 ENCOUNTER — Encounter: Payer: Self-pay | Admitting: Gastroenterology

## 2022-04-13 DIAGNOSIS — M9901 Segmental and somatic dysfunction of cervical region: Secondary | ICD-10-CM | POA: Diagnosis not present

## 2022-04-13 DIAGNOSIS — M5136 Other intervertebral disc degeneration, lumbar region: Secondary | ICD-10-CM | POA: Diagnosis not present

## 2022-04-13 DIAGNOSIS — M9903 Segmental and somatic dysfunction of lumbar region: Secondary | ICD-10-CM | POA: Diagnosis not present

## 2022-04-13 DIAGNOSIS — M5137 Other intervertebral disc degeneration, lumbosacral region: Secondary | ICD-10-CM | POA: Diagnosis not present

## 2022-04-13 NOTE — Telephone Encounter (Signed)
Patient is schedule for 12-15 for an office visit.

## 2022-04-20 DIAGNOSIS — R300 Dysuria: Secondary | ICD-10-CM | POA: Diagnosis not present

## 2022-05-05 DIAGNOSIS — N200 Calculus of kidney: Secondary | ICD-10-CM | POA: Diagnosis not present

## 2022-05-06 DIAGNOSIS — M9903 Segmental and somatic dysfunction of lumbar region: Secondary | ICD-10-CM | POA: Diagnosis not present

## 2022-05-06 DIAGNOSIS — M5137 Other intervertebral disc degeneration, lumbosacral region: Secondary | ICD-10-CM | POA: Diagnosis not present

## 2022-05-06 DIAGNOSIS — M5136 Other intervertebral disc degeneration, lumbar region: Secondary | ICD-10-CM | POA: Diagnosis not present

## 2022-05-06 DIAGNOSIS — M9901 Segmental and somatic dysfunction of cervical region: Secondary | ICD-10-CM | POA: Diagnosis not present

## 2022-05-13 DIAGNOSIS — F3341 Major depressive disorder, recurrent, in partial remission: Secondary | ICD-10-CM | POA: Diagnosis not present

## 2022-05-18 DIAGNOSIS — E119 Type 2 diabetes mellitus without complications: Secondary | ICD-10-CM | POA: Diagnosis not present

## 2022-05-18 DIAGNOSIS — C211 Malignant neoplasm of anal canal: Secondary | ICD-10-CM | POA: Diagnosis not present

## 2022-05-18 DIAGNOSIS — D649 Anemia, unspecified: Secondary | ICD-10-CM | POA: Diagnosis not present

## 2022-05-21 ENCOUNTER — Ambulatory Visit (INDEPENDENT_AMBULATORY_CARE_PROVIDER_SITE_OTHER): Payer: BC Managed Care – PPO | Admitting: Gastroenterology

## 2022-05-21 ENCOUNTER — Encounter: Payer: Self-pay | Admitting: Gastroenterology

## 2022-05-21 VITALS — BP 116/68 | HR 62 | Ht 66.0 in | Wt 154.0 lb

## 2022-05-21 DIAGNOSIS — D509 Iron deficiency anemia, unspecified: Secondary | ICD-10-CM

## 2022-05-21 DIAGNOSIS — Z86010 Personal history of colonic polyps: Secondary | ICD-10-CM

## 2022-05-21 DIAGNOSIS — K219 Gastro-esophageal reflux disease without esophagitis: Secondary | ICD-10-CM

## 2022-05-21 DIAGNOSIS — Z85048 Personal history of other malignant neoplasm of rectum, rectosigmoid junction, and anus: Secondary | ICD-10-CM | POA: Diagnosis not present

## 2022-05-21 MED ORDER — FAMOTIDINE 20 MG PO TABS: 20.0000 mg | ORAL_TABLET | Freq: Two times a day (BID) | ORAL | 3 refills | Status: AC | PRN

## 2022-05-21 NOTE — Progress Notes (Addendum)
HPI : Melinda [name] is a very pleasant 61 year old female with a history of anal cancer and chronic iron deficiency anemia who is referred to [place] by Dr. Joylene Draft for consideration of colonoscopy.  The patient was previously followed by Dr. Oletta Lamas of [facility], but the patient requested to transfer GI care to [place].  Some of her previous records were available for review. She was diagnosed with anal cancer in 2021 after undergoing a hemorrhoidectomy and incidentally finding anal cancer.  She underwent treatment with chemo and radiation which she completed but did not tolerate well and has not had any evidence of recurrent disease.  She is followed by Dr. Burr Medico and Dr. Dema Severin.  The patient has had iron deficiency anemia for at least 10 years.  Her hgb has been stable around 11.  She has taken PO iron in the past but doesn't tolerate it well.  She has had IV iron infusions multiple times and responds well. She reports having an EGD, Colonoscopy and video capsule endoscopy with [facility] and no identifiable cause of her iron deficiency was found.  She takes Nexium daily and has done so for many years.  If she doesn't take Nexium, she will experience heartburn and chest pressure/fullness. She tells me she was also diagnosed with osteoporosis.  She says that her last colonoscopy was in 2020 by Dr. Therisa Doyne. She does not think polyps were removed.  I can see she had a colonoscopy in 2014 and 2017 by Dr. Oletta Lamas and that hyperplastic polyps were removed both times; no adenomas. She has no family history of colon cancer.  She has chronic constipation which is well managed with colace as needed.  Otherwise, no chronic lower GI symptoms.   Hemorrhoid banding x 3 in 2020 (Dr. Earlean Shawl)   Colonoscopy 2020 Dr. Therisa Doyne [facility]) Per patient, unremarkable  Colonoscopy  April 2017, Dr. Oletta Lamas [facility])  Indication: Heme positive stool, iron deficiency anemia, family history of polyps  Findings: Many rectal hyperplastic  polyps (not removed), internal hemorrhoids, repeat colonoscopy 5 years   Colonoscopy  February 2014, Dr. Oletta Lamas [facility])  Indication: Heme positive stool, iron deficiency anemia, family history of polyps  Findings: Few rectal polyps, biopsied with forceps, pathology consistent with hyperplastic polyps   EGD  February 2014, Dr. Oletta Lamas [facility])  Endoscopy report not available for review  Histology report indicates duodenal biopsies were obtained which were normal   Esophageal manometry 2018  Normal  Unclear indication   Past Medical History:  Diagnosis Date   anal ca dx'd 07/2019   Diabetes mellitus type 2, controlled ([place])    Dyslipidemia    untreated   GERD (gastroesophageal reflux disease)    Hemorrhoids    Microcytic anemia 06/22/2012   Nephrolith    Palpitation    positive tilt table test. Prozac and Inderal treatment effective.     Past Surgical History:  Procedure Laterality Date   APPENDECTOMY     BREAST REDUCTION SURGERY     CARDIAC CATHETERIZATION  2002   normal LV function and no significant coronary obstruction   CATARACT EXTRACTION, BILATERAL Bilateral november 2020 and dcember 2020   CYST EXCISION N/A 01/11/2020   Procedure: EXCISION OF ANAL CANAL MASS;  Surgeon: Ileana Roup, MD;  Location: [facility];  Service: General;  Laterality: N/A;   CYSTOSCOPY/URETEROSCOPY/HOLMIUM LASER/STENT PLACEMENT Right 03/26/2020   Procedure: CYSTOSCOPY/URETEROSCOPY/HOLMIUM LASER/STENT PLACEMENT;  Surgeon: [name], MD;  Location: [facility];  Service: Urology;  Laterality: Right;  ESOPHAGEAL MANOMETRY N/A 04/04/2017   Procedure: ESOPHAGEAL MANOMETRY (EM);  Surgeon: Laurence Spates, MD;  Location: [facility];  Service: Endoscopy;  Laterality: N/A;   HEMORRHOID SURGERY N/A 07/13/2019   Procedure: HEMORRHOIDECTOMY;  Surgeon: Johnathan Hausen, MD;  Location: [facility];  Service: General;  Laterality: N/A;   KIDNEY SURGERY     kidney stones    PARTIAL  HYSTERECTOMY     RECTAL EXAM UNDER ANESTHESIA N/A 01/11/2020   Procedure: RECTAL EXAM UNDER ANESTHESIA;  Surgeon: Ileana Roup, MD;  Location: [facility];  Service: General;  Laterality: N/A;   TONSILLECTOMY     Family History  Problem Relation Age of Onset   Heart disease Mother    Liver disease Mother    Heart disease Father    Heart attack Father    Cancer Father        prostate CA   Cancer Maternal Grandmother 24       ovarian cancer   Coronary artery disease Other        Fx of   Heart attack Other    Diabetes Other    Social History   Tobacco Use   Smoking status: Former    Types: E-cigarettes    Quit date: 07/08/2017    Years since quitting: 4.8   Smokeless tobacco: Never   Tobacco comments:    uses electronic  Vaping Use   Vaping Use: Former   Quit date: 07/03/2016  Substance Use Topics   Alcohol use: No   Drug use: No   Current Outpatient Medications  Medication Sig Dispense Refill   clorazepate (TRANXENE) 7.5 MG tablet Take 7.5 mg by mouth 2 (two) times daily.  1   CRESTOR 5 MG tablet Take 5 mg by mouth every evening.   1   docusate sodium (COLACE) 100 MG capsule Take 1 capsule (100 mg total) by mouth 2 (two) times daily. 30 capsule 0   esomeprazole (NEXIUM) 40 MG capsule Take 40 mg by mouth daily at 12 noon.     FLUoxetine (PROZAC) 20 MG capsule Take 20 mg by mouth every morning.     metFORMIN (GLUCOPHAGE-XR) 500 MG 24 hr tablet Take 500 mg by mouth daily.     methenamine (HIPREX) 1 g tablet Take 1 g by mouth daily.      polyethylene glycol powder (GLYCOLAX/MIRALAX) 17 GM/SCOOP powder Take by mouth as needed.     propranolol (INDERAL) 10 MG tablet TAKE 1 TABLET BY MOUTH EVERY DAY 90 tablet 3   No current facility-administered medications for this visit.   Allergies  Allergen Reactions   Cephalexin Anaphylaxis and Other (See Comments)    Kef tabs only (maybe dye) Tolerated Keflex inpatient in 2016   Amoxapine And Related    Amoxicillin-Pot Clavulanate  Other (See Comments)   Atorvastatin Other (See Comments)    intolernace   Cefaclor Other (See Comments)   Ciprofloxacin Nausea And Vomiting   Codeine Hives   Doxycycline Calcium Nausea And Vomiting   Fenofibrate Micronized Other (See Comments)    Cramping   Fish Oil Other (See Comments)   Flagyl [Metronidazole Hcl]    Levofloxacin In D5w Other (See Comments)    Unknown    Metronidazole Nausea And Vomiting   Ofloxacin Other (See Comments)   Prednisolone Other (See Comments)    Anxiety, palpitations   Promethazine Hcl Other (See Comments)   Tape Other (See Comments)    Rash and blisters, only plastic tape   Tetracycline  Hcl Other (See Comments)   Erythromycin Rash   Nitrofurantoin Itching, Nausea And Vomiting and Rash     Review of Systems: All systems reviewed and negative except where noted in HPI.    No results found.  Physical Exam: BP 116/68   Pulse 62   Ht '5\' 6"'$  (1.676 m)   Wt 154 lb (69.9 kg)   BMI 24.86 kg/m  Constitutional: Pleasant,well-developed, Caucasian female in no acute distress. HEENT: Normocephalic and atraumatic. Conjunctivae are normal. No scleral icterus. Neck supple.  Cardiovascular: Normal rate, regular rhythm.  Pulmonary/chest: Effort normal and breath sounds normal. No wheezing, rales or rhonchi. Abdominal: Soft, nondistended, nontender. Bowel sounds active throughout. There are no masses palpable. No hepatomegaly. Extremities: no edema Neurological: Alert and oriented to person place and time. Skin: Skin is warm and dry. No rashes noted. Psychiatric: Normal mood and affect. Behavior is normal.  CBC    Component Value Date/Time   WBC 3.6 (L) 12/11/2021 1258   WBC 3.0 (L) 04/08/2020 1404   RBC 4.27 12/11/2021 1258   HGB 11.8 (L) 12/11/2021 1258   HGB 11.6 09/25/2012 1408   HCT 35.6 (L) 12/11/2021 1258   HCT 36.9 09/25/2012 1408   PLT 192 12/11/2021 1258   PLT 287 09/25/2012 1408   MCV 83.4 12/11/2021 1258   MCV 72.7 (L) 09/25/2012  1408   MCH 27.6 12/11/2021 1258   MCHC 33.1 12/11/2021 1258   RDW 16.4 (H) 12/11/2021 1258   RDW 23.1 (H) 09/25/2012 1408   LYMPHSABS 1.0 12/11/2021 1258   LYMPHSABS 2.4 09/25/2012 1408   MONOABS 0.2 12/11/2021 1258   MONOABS 0.3 09/25/2012 1408   EOSABS 0.1 12/11/2021 1258   EOSABS 0.2 09/25/2012 1408   BASOSABS 0.0 12/11/2021 1258   BASOSABS 0.1 09/25/2012 1408    CMP     Component Value Date/Time   NA 140 12/11/2021 1258   K 4.5 12/11/2021 1258   CL 108 12/11/2021 1258   CO2 28 12/11/2021 1258   GLUCOSE 122 (H) 12/11/2021 1258   BUN 23 12/11/2021 1258   CREATININE 0.90 03/26/2022 1514   CREATININE 0.88 12/11/2021 1258   CALCIUM 10.1 12/11/2021 1258   PROT 7.2 12/11/2021 1258   ALBUMIN 4.5 12/11/2021 1258   AST 19 12/11/2021 1258   ALT 21 12/11/2021 1258   ALKPHOS 54 12/11/2021 1258   BILITOT 0.4 12/11/2021 1258   GFRNONAA >60 12/11/2021 1258   GFRAA >60 03/10/2020 1253             Component Ref Range & Units 6 yr ago (02/28/16) 9 yr ago (09/25/12) 9 yr ago (09/04/12) 9 yr ago (08/14/12) 9 yr ago (07/26/12) 9 yr ago (06/20/12) 11 yr ago (01/26/11)  WBC 4.0 - 10.5 K/uL 6.8 5.5 R 7.4 R 8.1 R 6.6 R 7.6 R   RBC 3.87 - 5.11 MIL/uL 4.81 5.08 R 4.96 R 4.75 R 3.97 R 4.61 R   Hemoglobin 12.0 - 15.0 g/dL 10.2 Low  11.6 R 11.1 Low  R 10.0 Low  R 8.0 Repeated and verified X2. Low Panic  9.5 Low  13.2  HCT 36.0 - 46.0 % 35.0 Low  36.9 R 35.5 R 32.9 Low  R 25.7 aL Low  31.1 Low    MCV 78.0 - 100.0 fL 72.8 Low  72.7 Low  R 71.5 Low  R 69.2 Low  R 64.8 aL Low  R 67.5 Repeated and verified X2. Low  R   MCH 26.0 - 34.0 pg 21.2 Low  22.9 Low  R 22.4 Low  R 21.1 Low  R     MCHC 30.0 - 36.0 g/dL 29.1 Low  31.4 Low  R 31.4 Low  R 30.5 Low  R 31.2 30.6   RDW 11.5 - 15.5 % 14.6 23.1 High  R 24.4 High  R 23.4 High  R 15.7 High  R 15.4 High  R   Platelets 150 - 400 K/uL 304 287 R 288 R 261 R 342.0 R 388.0 R   NEUT#   2.5 R 3.7 R 4.1 R 3.2 R 4.0 R     Component Ref Range & Units 5 mo  ago (12/11/21) 11 mo ago (06/05/21) 1 yr ago (02/02/21) 1 yr ago (10/08/20) 1 yr ago (06/04/20) 2 yr ago (04/17/20) 2 yr ago (03/10/20)  Ferritin 11 - 307 ng/mL 115 47 CM 5 Low  CM 15 CM 75 CM 489 High  CM 419 High      ASSESSMENT AND PLAN: 61 year old female with a history of anal cancer with no evidence of disease and chronic iron deficiency anemia of unclear etiology.  Her last colonoscopy was apparently in 2020 by Dr. Therisa Doyne, but I am unable to view this report; she does not think she had polyps.  She had no adenomatous polyps on colonoscopies in 2014 or 2017.  It is unlikely she needs another colonoscopy now, but will request records from [facility] to ensure no high risk polyps were removed in 2020. With regards to her iron deficiency anemia, it appears that this is chronic and stable and a complete endoscopic evaluation has been performed with no potential sources of chronic GI blood loss identified; will request VCE results from [facility] as well. She does take Nexium chronically which could be a source of iron malabsorption.  She also tells me she has osteoporosis.  For these reasons, I recommended the patient try to wean off PPI.  Will plan to start Pepcid daily and then decrease Nexium to qod x 2 weeks then stop.  The patient requested to do this after the holidays which is reasonable.  History of polyps?: - Request complete records from [facility]  IDA, stable, chronic - Suspect malabsorption, possible PPI effect  GERD - Start Pepcid  - Wean Nexium (waiting until after holidays)  Osteoporosis - Wean off PPI as above  Hx of Anal cancer - Follow up per Drs. Feng/White  Kendale Rembold E. Candis Schatz, MD [facility]   CC:  Crist Infante, MD  Addendum:  We received a colonoscopy report from [facility] from 2020 in which she had 8 polyps removed (2 cecal, 5 transverse and 1 descending) and the bowel prep was listed as fair quality.  She was recommended to repeat colonoscopy in 1 year.  The  pathology reports from the resected polyps were not sent.  Based on this information, I would recommend we repeat a colonoscopy now.   With this additional i

## 2022-05-21 NOTE — Patient Instructions (Signed)
_______________________________________________________  If you are age 63 or older, your body mass index should be between 23-30. Your Body mass index is 24.86 kg/m. If this is out of the aforementioned range listed, please consider follow up with your Primary Care Provider.  If you are age 64 or younger, your body mass index should be between 19-25. Your Body mass index is 24.86 kg/m. If this is out of the aformentioned range listed, please consider follow up with your Primary Care Provider.   Start weaning of nexium after christmas  every other day for 2 weeks and then stop. It is ok to take Pecid with Nexium while weaning.  We have sent the following medications to your pharmacy for you to pick up at your convenience: Pepcid 20 mg   The [facility] providers would like to encourage you to use [facility] to communicate with providers for non-urgent requests or questions.  Due to long hold times on the telephone, sending your provider a message by [facility] may be a faster and more efficient way to get a response.  Please allow 48 business hours for a response.  Please remember that this is for non-urgent requests.  It was a pleasure to see you today!  Thank you for trusting me with your gastrointestinal care!

## 2022-05-26 NOTE — Telephone Encounter (Signed)
Hi Dr. Candis Schatz,  We received additional records which includes 2020 colonoscopy and 2018 EGD records. Scanned into Media for review.    Thanks

## 2022-06-04 ENCOUNTER — Telehealth: Payer: Self-pay

## 2022-06-04 NOTE — Telephone Encounter (Signed)
Melinda [name] can you help schedule this pt for a previsit and  colon in the [facility]?

## 2022-06-04 NOTE — Telephone Encounter (Signed)
-----   Message from Daryel November, MD sent at 05/28/2022  3:14 PM EST ----- Regarding: Schedule colonoscopy Vaughan Basta,  I saw this paitent in clinic recently, but we did not have complete records from [facility].  We since received her most recent colonoscopy report, and she is due for a colonoscopy.  Can you please get her set up for a routine surveillance colonoscopy in the [place]?  Thanks

## 2022-06-10 NOTE — Telephone Encounter (Signed)
Called patient to schedule and she said she will call back once ready.

## 2022-06-11 NOTE — Telephone Encounter (Signed)
See note below

## 2022-06-13 NOTE — Assessment & Plan Note (Signed)
-  presented to Dr Hassell Done for a hemorrhoidectomy and EUA on 07/13/19, pathology revealed invasive moderately differentiated squamous cell carcinoma, 1.4 cm, pT1. Staging CT 09/04/19 was negative. -She had local recurrence and underwent surgical excision under the care of Dr. Dema Severin on 01/11/20. -PET 01/28/20 showed no node or distant metastasis. -She was only able to complete 5 out of the planned 6 weeks of the standard concurrent chemoRT with Mitomycin and 5FU on due to poor tolerance of local skin irritation, N/V/D and had prolonged recovery -CT CAP on 06/05/21 showed NED. -most recent anoscopy with Dr. Dema Severin on 09/28/21 was benign. -She is clinically doing well. Labs reviewed, overall stable. Physical exam was unremarkable. There is no clinical concern for recurrence. -Continue surveillance.

## 2022-06-14 ENCOUNTER — Inpatient Hospital Stay: Payer: BC Managed Care – PPO | Attending: Hematology

## 2022-06-14 ENCOUNTER — Inpatient Hospital Stay (HOSPITAL_BASED_OUTPATIENT_CLINIC_OR_DEPARTMENT_OTHER): Payer: BC Managed Care – PPO | Admitting: Hematology

## 2022-06-14 ENCOUNTER — Encounter: Payer: Self-pay | Admitting: Hematology

## 2022-06-14 ENCOUNTER — Other Ambulatory Visit: Payer: Self-pay

## 2022-06-14 VITALS — BP 125/73 | HR 82 | Temp 97.9°F | Resp 15 | Ht 66.0 in | Wt 155.0 lb

## 2022-06-14 DIAGNOSIS — Z79899 Other long term (current) drug therapy: Secondary | ICD-10-CM | POA: Insufficient documentation

## 2022-06-14 DIAGNOSIS — C210 Malignant neoplasm of anus, unspecified: Secondary | ICD-10-CM | POA: Insufficient documentation

## 2022-06-14 DIAGNOSIS — K5900 Constipation, unspecified: Secondary | ICD-10-CM | POA: Insufficient documentation

## 2022-06-14 LAB — CMP (CANCER CENTER ONLY)
ALT: 14 U/L (ref 0–44)
AST: 17 U/L (ref 15–41)
Albumin: 4.4 g/dL (ref 3.5–5.0)
Alkaline Phosphatase: 51 U/L (ref 38–126)
Anion gap: 4 — ABNORMAL LOW (ref 5–15)
BUN: 25 mg/dL — ABNORMAL HIGH (ref 8–23)
CO2: 28 mmol/L (ref 22–32)
Calcium: 9.9 mg/dL (ref 8.9–10.3)
Chloride: 107 mmol/L (ref 98–111)
Creatinine: 0.85 mg/dL (ref 0.44–1.00)
GFR, Estimated: >60 mL/min (ref >60)
Glucose, Bld: 129 mg/dL — ABNORMAL HIGH (ref 70–99)
Potassium: 4.2 mmol/L (ref 3.5–5.1)
Sodium: 139 mmol/L (ref 135–145)
Total Bilirubin: 0.4 mg/dL (ref 0.3–1.2)
Total Protein: 6.5 g/dL (ref 6.5–8.1)

## 2022-06-14 LAB — CBC WITH DIFFERENTIAL (CANCER CENTER ONLY)
Abs Immature Granulocytes: 0 10*3/uL (ref 0.00–0.07)
Basophils Absolute: 0 10*3/uL (ref 0.0–0.1)
Basophils Relative: 1 %
Eosinophils Absolute: 0.1 10*3/uL (ref 0.0–0.5)
Eosinophils Relative: 3 %
HCT: 34.5 % — ABNORMAL LOW (ref 36.0–46.0)
Hemoglobin: 10.8 g/dL — ABNORMAL LOW (ref 12.0–15.0)
Immature Granulocytes: 0 %
Lymphocytes Relative: 26 %
Lymphs Abs: 1 10*3/uL (ref 0.7–4.0)
MCH: 24.3 pg — ABNORMAL LOW (ref 26.0–34.0)
MCHC: 31.3 g/dL (ref 30.0–36.0)
MCV: 77.7 fL — ABNORMAL LOW (ref 80.0–100.0)
Monocytes Absolute: 0.3 10*3/uL (ref 0.1–1.0)
Monocytes Relative: 8 %
Neutro Abs: 2.5 10*3/uL (ref 1.7–7.7)
Neutrophils Relative %: 62 %
Platelet Count: 205 10*3/uL (ref 150–400)
RBC: 4.44 MIL/uL (ref 3.87–5.11)
RDW: 13.2 % (ref 11.5–15.5)
WBC Count: 4 10*3/uL (ref 4.0–10.5)
nRBC: 0 % (ref 0.0–0.2)

## 2022-06-14 LAB — FERRITIN: Ferritin: 4 ng/mL — ABNORMAL LOW (ref 11–307)

## 2022-06-14 LAB — IRON AND IRON BINDING CAPACITY (CC-WL,HP ONLY)
Iron: 47 ug/dL (ref 28–170)
Saturation Ratios: 10 % — ABNORMAL LOW (ref 10.4–31.8)
TIBC: 493 ug/dL — ABNORMAL HIGH (ref 250–450)
UIBC: 446 ug/dL — ABNORMAL HIGH (ref 148–442)

## 2022-06-14 NOTE — Progress Notes (Signed)
[place]   [phone] [phone]   Clinic Follow up Note   Patient Care Team: Crist Infante, MD as PCP - General (Internal Medicine) Fay Records, MD as PCP - Cardiology (Cardiology) Nobie Putnam, MD (Hematology and Oncology) Heilingoetter, Tobe Sos, PA-C as Physician Assistant (Oncology) Truitt Merle, MD as Consulting Physician (Oncology) Jonnie Finner, RN (Inactive) as Oncology Nurse Navigator Ileana Roup, MD as Consulting Physician (General Surgery)  Date of Service:  06/14/2022  CHIEF COMPLAINT: f/u of anal cancer   CURRENT THERAPY: Surveillance    ASSESSMENT:  Melinda [name] is a 62 y.o. female with   Squamous cell cancer, anus ([place]) -Initially diagnosed on 07/13/19, s/p resection, pathology revealed invasive moderately differentiated squamous cell carcinoma, 1.4 cm, pT1. Staging CT 09/04/19 was negative. -She had local recurrence and underwent surgical excision under the care of Dr. Dema Severin on 01/11/20. -PET 01/28/20 showed no node or distant metastasis. -She was only able to complete 5 out of the planned 6 weeks of the standard concurrent chemoRT with Mitomycin and 5FU on due to poor tolerance of local skin irritation, N/V/D and had prolonged recovery -CT CAP on 06/05/21 showed NED. -most recent anoscopy with Dr. Dema Severin on 03/2022 was benign. -She is clinically doing well. Labs reviewed, overall stable. Physical exam was unremarkable. There is no clinical concern for recurrence. -Continue surveillance.  -plan to repeat CT scan in 03/2023   PLAN: -Lab reviewed  -f/u in 6 months, she will see Dr. Dema Severin in 3 months     SUMMARY OF ONCOLOGIC HISTORY: Oncology History Overview Note   Cancer Staging  Squamous cell cancer, anus ([place]) Staging form: Anus, AJCC 8th Edition - Clinical stage from 01/11/2020: Stage I (cT1, cN0, cM0) - Signed by Truitt Merle, MD on 06/14/2022 Stage prefix: Initial diagnosis Histologic grade (G):  G2 Histologic grading system: 4 grade system     Squamous cell cancer, anus ([place])  07/13/2019 Procedure    EUA, Excision of posterior internal/external hemorrhoid under the care of Dr. Hassell Done    07/13/2019 Pathology Results   - Invasive moderately differentiated squamous cell carcinoma, 1.4 cm.  See comment  - Carcinoma invades for depth of 0.4 cm  - Deep resection margin is negative for carcinoma (0.2 cm)  - Lateral mucosal margin is positive for high-grade dysplasia  - No evidence of lymphovascular perineural invasion  Procedure: Local excision  Tumor Site: Anal canal  Tumor Size: 1.4 cm  Histologic Type: Invasive squamous cell carcinoma  Histologic Grade: G2: Moderately differentiated  Tumor Extension: Carcinoma invades superficial anal sphincter muscle  Margins: Uninvolved by tumor  Treatment Effect: N/A  Regional Lymph Nodes: No lymph nodes submitted or found  Pathologic Stage Classification (pTNM, AJCC 8th Edition):  pT1, pNx  Representative Tumor Block: A1  Comment(s): Lateral mucosal margin is positive for high-grade squamous  dysplasia      07/13/2019 Initial Diagnosis   Squamous cell cancer, anus ([place])   09/04/2019 Imaging   CT scan Chest, Abdomen, and Pelvis   IMPRESSION: 1. No evidence of metastatic disease in the chest, abdomen or pelvis. 2. No discrete anorectal mass. 3. Chronic findings include: Punctate nonobstructing upper right renal stones and chronic right renal scarring. 4. Aortic Atherosclerosis (ICD10-I70.0).   01/10/2020 Pathology Results   A. ANAL LESION, POSTERIOR MIDLINE, EXCISION:  - Squamous cell carcinoma, moderately differentiated.  Verbally reported by Dr. Saralyn Pilar 1.5 cm, negative margins, depth <1 mm   01/11/2020 Procedure   1. Excision  of anal canal lesion (posterior midline) under the care of Dr. Dema Severin     01/11/2020 Cancer Staging   Staging form: Anus, AJCC 8th Edition - Clinical stage from 01/11/2020: Stage I (cT1, cN0, cM0) - Signed by  Truitt Merle, MD on 06/14/2022 Stage prefix: Initial diagnosis Histologic grade (G): G2 Histologic grading system: 4 grade system   01/28/2020 PET scan   IMPRESSION: 1. Mild focal anal hypermetabolism without discrete mass correlate on the CT images, nonspecific, differential includes postsurgical change or residual anal tumor. 2. No hypermetabolic locoregional or distant metastatic disease. 3. Chronic findings include: Aortic Atherosclerosis (ICD10-I70.0). Diffuse hepatic steatosis. Nonobstructing right nephrolithiasis.     02/04/2020 - 03/07/2020 Radiation Therapy   concurrent chemoRT by Dr Lisbeth Renshaw with Mitomycin and 5FU starting 02/04/20-03/07/20. The last 4 session cancelled due to poor toleration.    02/04/2020 - 03/03/2020 Chemotherapy   Concurrent chemoRT with Mitomycin and 5FU on week 1 and 5 starting 02/04/20-03/03/20   03/13/2020 Imaging   CT AP W contrast  IMPRESSION: Diffuse wall thickening with inflammatory changes involving a long segment of distal ileum, cecum, and sigmoid colon likely represents enteritis/colitis. This could be due to infectious, or inflammatory nature, not thought to represent ischemic colitis.   Hepatic steatosis   Trace pericardial effusion   Small free fluid in the deep pelvis   06/04/2020 Imaging   PET IMPRESSION: 1. Diminished metabolic uptake about the rectum with presumed post radiation changes showing mild perirectal stranding and perianal stranding on today's exam. Continued correlation with direct visualization may be helpful to exclude any residual tumor in this location but diminished FDG uptake is encouraging and findings could certainly be seen in the setting of post treatment change. 2. No signs of metastatic disease to the neck, chest, abdomen or pelvis. 3. Diminished perienteric stranding about distal small bowel with persistent mild stranding and stool like material in distal small bowel likely related to enteritis and associated with  mild but diminished FDG uptake. Attention on follow-up. 4. Resolution of RIGHT-sided hydronephrosis with signs of nephrolithiasis as before. 5. Mild asymmetry of metabolic activity along the RIGHT chest wall associated serrated S musculature adjacent to the tip of the scapula felt to represent asymmetric muscular activity. Correlate with any symptoms in this area and suggest attention on follow-up.   Aortic Atherosclerosis (ICD10-I70.0).   06/05/2021 Imaging   EXAM: CT CHEST, ABDOMEN, AND PELVIS WITH CONTRAST  IMPRESSION: 1. No new or progressive findings to suggest metastatic disease in the chest, abdomen, or pelvis. 2. The wall thickening and perienteric edema associated with the terminal ileum on prior studies has resolved completely. Presacral and perirectal edema/stranding seen previously has also resolved. 3. Tiny bilateral pulmonary nodules, stable since prior PET-CT, consistent with benign etiology. 4. Right renal cortical scarring with tiny nonobstructing stones. 5. Aortic Atherosclerosis (ICD10-I70.0).      INTERVAL HISTORY:  Melinda [name] is here for a follow up of anal cancer   She was last seen by me on 7/723  She presents to the clinic accompanied by husband. Pt states she had to take Colace for BM , does have some bleeding from having constipation. Overall patient is doing well.    All other systems were reviewed with the patient and are negative.  MEDICAL HISTORY:  Past Medical History:  Diagnosis Date   anal ca dx'd 07/2019   Diabetes mellitus type 2, controlled ([place])    Dyslipidemia    untreated   GERD (gastroesophageal reflux disease)  Hemorrhoids    Microcytic anemia 06/22/2012   Nephrolith    Palpitation    positive tilt table test. Prozac and Inderal treatment effective.    SURGICAL HISTORY: Past Surgical History:  Procedure Laterality Date   APPENDECTOMY     BREAST REDUCTION SURGERY     CARDIAC CATHETERIZATION  2002   normal LV  function and no significant coronary obstruction   CATARACT EXTRACTION, BILATERAL Bilateral november 2020 and dcember 2020   CYST EXCISION N/A 01/11/2020   Procedure: EXCISION OF ANAL CANAL MASS;  Surgeon: Ileana Roup, MD;  Location: [facility];  Service: General;  Laterality: N/A;   CYSTOSCOPY/URETEROSCOPY/HOLMIUM LASER/STENT PLACEMENT Right 03/26/2020   Procedure: CYSTOSCOPY/URETEROSCOPY/HOLMIUM LASER/STENT PLACEMENT;  Surgeon: [name], MD;  Location: [facility];  Service: Urology;  Laterality: Right;   ESOPHAGEAL MANOMETRY N/A 04/04/2017   Procedure: ESOPHAGEAL MANOMETRY (EM);  Surgeon: Laurence Spates, MD;  [facility];  Service: Endoscopy;  Laterality: N/A;   HEMORRHOID SURGERY N/A 07/13/2019   Procedure: HEMORRHOIDECTOMY;  Surgeon: Johnathan Hausen, MD;  Location: [facility];  Service: General;  Laterality: N/A;   KIDNEY SURGERY     kidney stones    PARTIAL HYSTERECTOMY     RECTAL EXAM UNDER ANESTHESIA N/A 01/11/2020   Procedure: RECTAL EXAM UNDER ANESTHESIA;  Surgeon: Ileana Roup, MD;  [facility]: [facility];  Service: General;  Laterality: N/A;   TONSILLECTOMY      I have reviewed the social history and family history with the patient and they are unchanged from previous note.  ALLERGIES:  is allergic to cephalexin, amoxapine and related, amoxicillin-pot clavulanate, atorvastatin, cefaclor, ciprofloxacin, codeine, doxycycline calcium, fenofibrate micronized, fish oil, flagyl [metronidazole hcl], levofloxacin in d5w, metronidazole, ofloxacin, prednisolone, promethazine hcl, tape, tetracycline hcl, erythromycin, and nitrofurantoin.  MEDICATIONS:  Current Outpatient Medications  Medication Sig Dispense Refill   clorazepate (TRANXENE) 7.5 MG tablet Take 7.5 mg by mouth 2 (two) times daily.  1   CRESTOR 5 MG tablet Take 5 mg by mouth every evening.   1   docusate sodium (COLACE) 100 MG capsule Take 1 capsule (100 mg total) by mouth 2 (two) times daily.  30 capsule 0   esomeprazole (NEXIUM) 40 MG capsule Take 40 mg by mouth daily at 12 noon.     famotidine (PEPCID) 20 MG tablet Take 1 tablet (20 mg total) by mouth every 12 (twelve) hours as needed for heartburn or indigestion. 120 tablet 3   FLUoxetine (PROZAC) 20 MG capsule Take 20 mg by mouth every morning.     metFORMIN (GLUCOPHAGE-XR) 500 MG 24 hr tablet Take 500 mg by mouth daily.     methenamine (HIPREX) 1 g tablet Take 1 g by mouth daily.      polyethylene glycol powder (GLYCOLAX/MIRALAX) 17 GM/SCOOP powder Take by mouth as needed.     propranolol (INDERAL) 10 MG tablet TAKE 1 TABLET BY MOUTH EVERY DAY 90 tablet 3   No current facility-administered medications for this visit.    PHYSICAL EXAMINATION: ECOG PERFORMANCE STATUS: 0 - Asymptomatic  Vitals:   06/14/22 1343  BP: 125/73  Pulse: 82  Resp: 15  Temp: 97.9 F (36.6 C)  SpO2: 99%   Wt Readings from Last 3 Encounters:  06/14/22 155 lb (70.3 kg)  05/21/22 154 lb (69.9 kg)  12/11/21 150 lb 6.4 oz (68.2 kg)    GENERAL:alert, no distress and comfortable SKIN: skin color, texture, turgor are normal, no rashes or significant lesions EYES: normal, Conjunctiva are pink and  non-injected, sclera clear NECK: (-) supple, thyroid normal size, non-tender, without nodularity LYMPH: (-)  no palpable lymphadenopathy in the cervical, axillary or inguinal  LUNGS:(-) clear to auscultation and percussion with normal breathing effort HEART:(-)  regular rate & rhythm and no murmurs and no lower extremity edema ABDOMEN(-) :abdomen soft, non-tender and normal bowel sounds Musculoskeletal:no cyanosis of digits and no clubbing  NEURO: alert & oriented x 3 with fluent speech, no focal motor/sensory deficits RECTAL: No palpable mass. Rectal exam benign.    LABORATORY DATA:  I have reviewed the data as listed    Latest Ref Rng & Units 06/14/2022    1:17 PM 12/11/2021   12:58 PM 02/02/2021   12:38 PM  CBC  WBC 4.0 - 10.5 K/uL 4.0  3.6  3.7    Hemoglobin 12.0 - 15.0 g/dL 10.8  11.8  11.1   Hematocrit 36.0 - 46.0 % 34.5  35.6  34.9   Platelets 150 - 400 K/uL 205  192  218         Latest Ref Rng & Units 06/14/2022    1:17 PM 03/26/2022    3:14 PM 12/11/2021   12:58 PM  CMP  Glucose 70 - 99 mg/dL 129   122   BUN 8 - 23 mg/dL 25   23   Creatinine 0.44 - 1.00 mg/dL 0.85  0.90  0.88   Sodium 135 - 145 mmol/L 139   140   Potassium 3.5 - 5.1 mmol/L 4.2   4.5   Chloride 98 - 111 mmol/L 107   108   CO2 22 - 32 mmol/L 28   28   Calcium 8.9 - 10.3 mg/dL 9.9   10.1   Total Protein 6.5 - 8.1 g/dL 6.5   7.2   Total Bilirubin 0.3 - 1.2 mg/dL 0.4   0.4   Alkaline Phos 38 - 126 U/L 51   54   AST 15 - 41 U/L 17   19   ALT 0 - 44 U/L 14   21       RADIOGRAPHIC STUDIES: I have personally reviewed the radiological images as listed and agreed with the findings in the report. No results found.    No orders of the defined types were placed in this encounter.  All questions were answered. The patient knows to call the clinic with any problems, questions or concerns. No barriers to learning was detected. The total time spent in the appointment was 20 minutes.     Truitt Merle, MD 06/14/2022   Felicity Coyer, CMA, am acting as scribe for Truitt Merle, MD.   I have reviewed the above documentation for accuracy and completeness, and I agree with the above.

## 2022-06-21 ENCOUNTER — Other Ambulatory Visit: Payer: Self-pay

## 2022-06-22 ENCOUNTER — Ambulatory Visit: Payer: BC Managed Care – PPO

## 2022-06-23 ENCOUNTER — Telehealth: Payer: Self-pay

## 2022-06-23 NOTE — Telephone Encounter (Addendum)
Called patient and gave her the lab results, she will start taking iron that her PCP prescribed and also her PCP has set her up to receive IV iron this week waiting for insurance to approve. Will set up labs in 3 months. Her PCP is doing labs again in 1 month after the infusion.     ----- Message from Truitt Merle, MD sent at 06/22/2022  2:51 PM EST ----- Please let pt know her iron is low, and her anemia is slightly worse. I recommend OTC ferric sulfate once a day, or if she prefer, we can schedule her to come in for iv feraheme (she had in 10/2021), ok to schedule weeklyX2. Please also schedule lab in 2-3 months. Thanks   Truitt Merle

## 2022-06-25 ENCOUNTER — Telehealth: Payer: Self-pay | Admitting: Pharmacy Technician

## 2022-06-25 NOTE — Telephone Encounter (Signed)
Auth Submission: APPROVED Payer: [employer] Medication & CPT/J Code(s) submitted: Feraheme (ferumoxytol) [id number] Route of submission (phone, fax, portal): Fax/Portal Phone # Fax # Auth type: Buy/Bill Units/visits requested: 6893 ('510mg'$  x2 doses) Reference number: [id number] Approval from: 06/24/2022 to 12/21/2022 at [facility] as Buy/Bill

## 2022-06-29 ENCOUNTER — Ambulatory Visit: Payer: BC Managed Care – PPO

## 2022-06-29 ENCOUNTER — Ambulatory Visit (INDEPENDENT_AMBULATORY_CARE_PROVIDER_SITE_OTHER): Payer: BC Managed Care – PPO

## 2022-06-29 VITALS — BP 114/73 | HR 79 | Temp 97.5°F | Resp 18 | Ht 66.0 in | Wt 153.0 lb

## 2022-06-29 DIAGNOSIS — D649 Anemia, unspecified: Secondary | ICD-10-CM

## 2022-06-29 MED ORDER — SODIUM CHLORIDE 0.9 % IV SOLN
510.0000 mg | Freq: Once | INTRAVENOUS | Status: AC
  Administered 2022-06-29: 510 mg via INTRAVENOUS
  Filled 2022-06-29: qty 17

## 2022-06-29 MED ORDER — DIPHENHYDRAMINE HCL 25 MG PO CAPS: 25.0000 mg | ORAL_CAPSULE | Freq: Once | ORAL | Status: DC

## 2022-06-29 MED ORDER — ACETAMINOPHEN 325 MG PO TABS: 650.0000 mg | ORAL_TABLET | Freq: Once | ORAL | Status: DC

## 2022-06-29 NOTE — Progress Notes (Signed)
Diagnosis: Iron Deficiency Anemia  Provider:  Marshell Garfinkel MD  Procedure: Infusion  IV Type: Peripheral, IV Location: L Antecubital  Feraheme (Ferumoxytol), Dose: 510 mg  Infusion Start Time: 7673  Infusion Stop Time: 1202  Post Infusion IV Care: Patient declined observation and Peripheral IV Discontinued  Discharge: Condition: Good, Destination: Home . AVS provided to patient.   Performed by:  Cleophus Molt, RN

## 2022-07-06 ENCOUNTER — Ambulatory Visit (INDEPENDENT_AMBULATORY_CARE_PROVIDER_SITE_OTHER): Payer: BC Managed Care – PPO

## 2022-07-06 VITALS — BP 118/76 | HR 73 | Temp 98.1°F | Resp 18 | Ht 66.0 in | Wt 154.0 lb

## 2022-07-06 DIAGNOSIS — D649 Anemia, unspecified: Secondary | ICD-10-CM | POA: Diagnosis not present

## 2022-07-06 MED ORDER — SODIUM CHLORIDE 0.9 % IV SOLN
510.0000 mg | Freq: Once | INTRAVENOUS | Status: AC
  Administered 2022-07-06: 510 mg via INTRAVENOUS
  Filled 2022-07-06: qty 17

## 2022-07-06 MED ORDER — DIPHENHYDRAMINE HCL 25 MG PO CAPS: 25.0000 mg | ORAL_CAPSULE | Freq: Once | ORAL | Status: DC

## 2022-07-06 MED ORDER — ACETAMINOPHEN 325 MG PO TABS: 650.0000 mg | ORAL_TABLET | Freq: Once | ORAL | Status: DC

## 2022-07-06 NOTE — Progress Notes (Signed)
Diagnosis: Iron Deficiency Anemia  Provider:  Marshell Garfinkel MD  Procedure: Infusion  IV Type: Peripheral, IV Location: L Antecubital  Feraheme (Ferumoxytol), Dose: 510 mg  Infusion Start Time: 0981  Infusion Stop Time: 1101  Post Infusion IV Care: Peripheral IV Discontinued  Discharge: Condition: Good, Destination: Home . AVS provided to patient.   Performed by:  Arnoldo Morale, RN

## 2022-08-03 DIAGNOSIS — M5137 Other intervertebral disc degeneration, lumbosacral region: Secondary | ICD-10-CM | POA: Diagnosis not present

## 2022-08-03 DIAGNOSIS — M9903 Segmental and somatic dysfunction of lumbar region: Secondary | ICD-10-CM | POA: Diagnosis not present

## 2022-08-03 DIAGNOSIS — M5136 Other intervertebral disc degeneration, lumbar region: Secondary | ICD-10-CM | POA: Diagnosis not present

## 2022-08-03 DIAGNOSIS — M9901 Segmental and somatic dysfunction of cervical region: Secondary | ICD-10-CM | POA: Diagnosis not present

## 2022-08-19 DIAGNOSIS — M9901 Segmental and somatic dysfunction of cervical region: Secondary | ICD-10-CM | POA: Diagnosis not present

## 2022-08-19 DIAGNOSIS — M5136 Other intervertebral disc degeneration, lumbar region: Secondary | ICD-10-CM | POA: Diagnosis not present

## 2022-08-19 DIAGNOSIS — M5137 Other intervertebral disc degeneration, lumbosacral region: Secondary | ICD-10-CM | POA: Diagnosis not present

## 2022-08-19 DIAGNOSIS — M9903 Segmental and somatic dysfunction of lumbar region: Secondary | ICD-10-CM | POA: Diagnosis not present

## 2022-08-28 ENCOUNTER — Other Ambulatory Visit: Payer: Self-pay | Admitting: Internal Medicine

## 2022-09-13 DIAGNOSIS — C210 Malignant neoplasm of anus, unspecified: Secondary | ICD-10-CM | POA: Diagnosis not present

## 2022-10-08 DIAGNOSIS — H53143 Visual discomfort, bilateral: Secondary | ICD-10-CM | POA: Diagnosis not present

## 2022-11-12 DIAGNOSIS — D649 Anemia, unspecified: Secondary | ICD-10-CM | POA: Diagnosis not present

## 2022-11-12 DIAGNOSIS — K219 Gastro-esophageal reflux disease without esophagitis: Secondary | ICD-10-CM | POA: Diagnosis not present

## 2022-11-12 DIAGNOSIS — E119 Type 2 diabetes mellitus without complications: Secondary | ICD-10-CM | POA: Diagnosis not present

## 2022-11-12 DIAGNOSIS — Z0000 Encounter for general adult medical examination without abnormal findings: Secondary | ICD-10-CM | POA: Diagnosis not present

## 2022-11-12 DIAGNOSIS — M810 Age-related osteoporosis without current pathological fracture: Secondary | ICD-10-CM | POA: Diagnosis not present

## 2022-11-12 DIAGNOSIS — E785 Hyperlipidemia, unspecified: Secondary | ICD-10-CM | POA: Diagnosis not present

## 2022-11-12 DIAGNOSIS — R946 Abnormal results of thyroid function studies: Secondary | ICD-10-CM | POA: Diagnosis not present

## 2022-11-12 LAB — LAB REPORT - SCANNED
A1c: 6.2
EGFR: 63.2

## 2022-11-19 ENCOUNTER — Other Ambulatory Visit: Payer: Self-pay

## 2022-11-19 DIAGNOSIS — I471 Supraventricular tachycardia, unspecified: Secondary | ICD-10-CM | POA: Diagnosis not present

## 2022-11-19 DIAGNOSIS — E119 Type 2 diabetes mellitus without complications: Secondary | ICD-10-CM | POA: Diagnosis not present

## 2022-11-19 DIAGNOSIS — Z0000 Encounter for general adult medical examination without abnormal findings: Secondary | ICD-10-CM | POA: Diagnosis not present

## 2022-11-19 DIAGNOSIS — R82998 Other abnormal findings in urine: Secondary | ICD-10-CM | POA: Diagnosis not present

## 2022-11-19 LAB — LAB REPORT - SCANNED
Albumin, Urine POC: 48.2
Albumin/Creatinine Ratio, Urine, POC: 24
Creatinine, POC: 197.3 mg/dL

## 2022-11-19 MED ORDER — PROPRANOLOL HCL 10 MG PO TABS: 10.0000 mg | ORAL_TABLET | Freq: Every day | ORAL | 0 refills | Status: DC

## 2022-11-23 DIAGNOSIS — F3341 Major depressive disorder, recurrent, in partial remission: Secondary | ICD-10-CM | POA: Diagnosis not present

## 2022-11-30 NOTE — Progress Notes (Unsigned)
Cardiology Office Note   Date:  12/02/2022   ID:  Melinda [name], Melinda [name] Nov 04, 1960, MRN [id number]  PCP:  Rodrigo Ran, MD  Cardiologist:   Dietrich Pates, MD   F/U of Chest tightness     History of Present Illness: Melinda [name] is a 62 y.o. female with a history of CP. She has had multiple stress tests   CCTA in 2014 Ca score 0  Normal coronary arteries Stress echo in 2018 normal   The pt also hs ha hx of palpitations  Occasional PACs noted   She has been maintained on Inderal     I saw the pt in clinic in April 2023  The patient says at night when she lays down she will get a funny feeling in chest  ? Pressure or a fluttering   Lasts for a little bit  Eases   Nt at other times   No PND   PT says during the day she feels good  Active    Current Meds  Medication Sig   clorazepate (TRANXENE) 7.5 MG tablet Take 7.5 mg by mouth 2 (two) times daily.   docusate sodium (COLACE) 100 MG capsule Take 1 capsule (100 mg total) by mouth 2 (two) times daily.   esomeprazole (NEXIUM) 40 MG capsule Take 40 mg by mouth daily at 12 noon.   famotidine (PEPCID) 20 MG tablet Take 1 tablet (20 mg total) by mouth every 12 (twelve) hours as needed for heartburn or indigestion.   FLUoxetine (PROZAC) 20 MG capsule Take 20 mg by mouth every morning.   meclizine (ANTIVERT) 25 MG tablet Take 25 mg by mouth 2 (two) times daily as needed for dizziness.   metFORMIN (GLUCOPHAGE-XR) 500 MG 24 hr tablet Take 500 mg by mouth daily.   methenamine (HIPREX) 1 g tablet Take 1 g by mouth daily.    polyethylene glycol powder (GLYCOLAX/MIRALAX) 17 GM/SCOOP powder Take by mouth as needed.   propranolol (INDERAL) 10 MG tablet Take 1 tablet (10 mg total) by mouth daily.   rosuvastatin (CRESTOR) 20 MG tablet Take 20 mg by mouth daily.     Allergies:   Cephalexin, Amoxapine and related, Amoxicillin-pot clavulanate, Atorvastatin, Cefaclor, Ciprofloxacin, Codeine, Doxycycline calcium, Fenofibrate micronized, Fish oil, Flagyl  [metronidazole hcl], Levofloxacin in d5w, Metronidazole, Ofloxacin, Prednisolone, Promethazine hcl, Tape, Tetracycline hcl, Erythromycin, and Nitrofurantoin   Past Medical History:  Diagnosis Date   anal ca dx'd 07/2019   Diabetes mellitus type 2, controlled ([facility])    Dyslipidemia    untreated   GERD (gastroesophageal reflux disease)    Hemorrhoids    Microcytic anemia 06/22/2012   Nephrolith    Palpitation    positive tilt table test. Prozac and Inderal treatment effective.    Past Surgical History:  Procedure Laterality Date   APPENDECTOMY     BREAST REDUCTION SURGERY     CARDIAC CATHETERIZATION  2002   normal LV function and no significant coronary obstruction   CATARACT EXTRACTION, BILATERAL Bilateral november 2020 and dcember 2020   CYST EXCISION N/A 01/11/2020   Procedure: EXCISION OF ANAL CANAL MASS;  Surgeon: Andria Meuse, MD;  [facility]: [facility];  Service: General;  Laterality: N/A;   CYSTOSCOPY/URETEROSCOPY/HOLMIUM LASER/STENT PLACEMENT Right 03/26/2020   Procedure: CYSTOSCOPY/URETEROSCOPY/HOLMIUM LASER/STENT PLACEMENT;  Surgeon: Crist Fat, MD;  [facility];  Service: Urology;  Laterality: Right;   ESOPHAGEAL MANOMETRY N/A 04/04/2017   Procedure: ESOPHAGEAL MANOMETRY (EM);  Surgeon: Carman Ching, MD;  [facility];  Service: Endoscopy;  Laterality: N/A;   HEMORRHOID SURGERY N/A 07/13/2019   Procedure: HEMORRHOIDECTOMY;  Surgeon: Luretha Murphy, MD;  Location: [facility];  Service: General;  Laterality: N/A;   KIDNEY SURGERY     kidney stones    PARTIAL HYSTERECTOMY     RECTAL EXAM UNDER ANESTHESIA N/A 01/11/2020   Procedure: RECTAL EXAM UNDER ANESTHESIA;  Surgeon: Andria Meuse, MD;  [facility];  Service: General;  Laterality: N/A;   TONSILLECTOMY       Social History:  The patient  reports that she quit smoking about 5 years ago. Her smoking use included e-cigarettes. She has never used smokeless tobacco. She  reports that she does not drink alcohol and does not use drugs.   Family History:  The patient's family history includes Cancer in her father; Cancer (age of onset: 44) in her maternal grandmother; Coronary artery disease in an other family member; Diabetes in an other family member; Heart attack in her father and another family member; Heart disease in her father and mother; Liver disease in her mother.    ROS:  Please see the history of present illness. All other systems are reviewed and  Negative to the above problem except as noted.    PHYSICAL EXAM: VS:  BP 116/64   Pulse 68   Ht 5\' 6"  (1.676 m)   Wt 152 lb 3.2 oz (69 kg)   SpO2 97%   BMI 24.57 kg/m   GEN: Well nourished, well developed, in no acute distress  Neck: JVP is normal    Cardiac: RRR; no murmur  No  LE edema  Respiratory:  clear to auscultation GI: soft, nontender,  No  hepatomegaly   EKG:  EKG shows SR 68 bpm  Lipid Panel    Component Value Date/Time   CHOL 140 04/09/2020 0535   CHOL 152 05/15/2018 1301   TRIG 278 (H) 04/09/2020 0535   HDL 24 (L) 04/09/2020 0535   HDL 32 (L) 05/15/2018 1301   CHOLHDL 5.8 04/09/2020 0535   VLDL 56 (H) 04/09/2020 0535   LDLCALC 60 04/09/2020 0535   LDLCALC 61 05/15/2018 1301      Wt Readings from Last 3 Encounters:  12/02/22 152 lb 3.2 oz (69 kg)  07/06/22 154 lb (69.9 kg)  06/29/22 153 lb (69.4 kg)      ASSESSMENT AND PLAN:  1  Palpitations  Occasional   Usually in bed   I am not convinced of a significant arrhythmia  Follow  Keep on Inderal  2  CP Some pressure when lays down at night  Not at other times   Follow   3   Lipid  Will get lipids from Dr Perini's office  Follow up in 1 year   Current medicines are reviewed at length with the patient today.  The patient does not have concerns regarding medicines.  Signed, Dietrich Pates, MD  12/02/2022 1:32 PM    [facility] [address] [place], [place], [place]  [address] Phone: [phone]; Fax:  [phone]

## 2022-12-02 ENCOUNTER — Encounter: Payer: Self-pay | Admitting: Internal Medicine

## 2022-12-02 ENCOUNTER — Ambulatory Visit: Payer: BC Managed Care – PPO | Attending: Internal Medicine | Admitting: Internal Medicine

## 2022-12-02 VITALS — BP 116/64 | HR 68 | Ht 66.0 in | Wt 152.2 lb

## 2022-12-02 DIAGNOSIS — M7989 Other specified soft tissue disorders: Secondary | ICD-10-CM | POA: Diagnosis not present

## 2022-12-02 DIAGNOSIS — R079 Chest pain, unspecified: Secondary | ICD-10-CM | POA: Diagnosis not present

## 2022-12-02 DIAGNOSIS — M79662 Pain in left lower leg: Secondary | ICD-10-CM | POA: Diagnosis not present

## 2022-12-02 MED ORDER — PROPRANOLOL HCL 10 MG PO TABS: 10.0000 mg | ORAL_TABLET | Freq: Every day | ORAL | 0 refills | Status: DC

## 2022-12-02 NOTE — Patient Instructions (Signed)
Medication Instructions:  Your physician recommends that you continue on your current medications as directed. Please refer to the Current Medication list given to you today.  *If you need a refill on your cardiac medications before your next appointment, please call your pharmacy*   Follow-Up: At [facility], you and your health needs are our priority.  As part of our continuing mission to provide you with exceptional heart care, we have created designated Provider Care Teams.  These Care Teams include your primary Cardiologist (physician) and Advanced Practice Providers (APPs -  Physician Assistants and Nurse Practitioners) who all work together to provide you with the care you need, when you need it.  We recommend signing up for the patient portal called "MyChart".  Sign up information is provided on this After Visit Summary.  MyChart is used to connect with patients for Virtual Visits (Telemedicine).  Patients are able to view lab/test results, encounter notes, upcoming appointments, etc.  Non-urgent messages can be sent to your provider as well.   To learn more about what you can do with MyChart, go to [email or web address].    Your next appointment:   1 year(s)  Provider:   Paula Ross, MD     

## 2022-12-03 ENCOUNTER — Telehealth: Payer: Self-pay

## 2022-12-03 NOTE — Telephone Encounter (Signed)
Spoke to [name] in medical records. Requested most recent labs per Dr. Tenny Craw' request. Melinda [name] confirmed Pt had labs done at the begin of June and would fax those to [place].

## 2022-12-12 NOTE — Assessment & Plan Note (Signed)
-  Initially diagnosed on 07/13/19, s/p resection, pathology revealed invasive moderately differentiated squamous cell carcinoma, 1.4 cm, pT1. Staging CT 09/04/19 was negative. -She had local recurrence and underwent surgical excision under the care of Dr. Cliffton Asters on 01/11/20. -PET 01/28/20 showed no node or distant metastasis. -She was only able to complete 5 out of the planned 6 weeks of the standard concurrent chemoRT with Mitomycin and 5FU on due to poor tolerance of local skin irritation, N/V/D and had prolonged recovery -CT CAP on 06/05/21 showed NED. -most recent anoscopy with Dr. Cliffton Asters on 03/2022 was benign. -She is clinically doing well. Labs reviewed, overall stable. Physical exam was unremarkable. There is no clinical concern for recurrence. -Continue surveillance.  -plan to repeat CT scan in 03/2023

## 2022-12-13 ENCOUNTER — Encounter: Payer: Self-pay | Admitting: Hematology

## 2022-12-13 ENCOUNTER — Inpatient Hospital Stay: Payer: BC Managed Care – PPO | Attending: Hematology

## 2022-12-13 ENCOUNTER — Inpatient Hospital Stay (HOSPITAL_BASED_OUTPATIENT_CLINIC_OR_DEPARTMENT_OTHER): Payer: BC Managed Care – PPO | Admitting: Hematology

## 2022-12-13 VITALS — BP 109/67 | HR 72 | Temp 98.2°F | Resp 18 | Ht 66.0 in | Wt 152.2 lb

## 2022-12-13 DIAGNOSIS — Z85048 Personal history of other malignant neoplasm of rectum, rectosigmoid junction, and anus: Secondary | ICD-10-CM | POA: Insufficient documentation

## 2022-12-13 DIAGNOSIS — R110 Nausea: Secondary | ICD-10-CM | POA: Diagnosis not present

## 2022-12-13 DIAGNOSIS — K30 Functional dyspepsia: Secondary | ICD-10-CM | POA: Diagnosis not present

## 2022-12-13 DIAGNOSIS — Z923 Personal history of irradiation: Secondary | ICD-10-CM | POA: Insufficient documentation

## 2022-12-13 DIAGNOSIS — C210 Malignant neoplasm of anus, unspecified: Secondary | ICD-10-CM | POA: Diagnosis not present

## 2022-12-13 DIAGNOSIS — Z9221 Personal history of antineoplastic chemotherapy: Secondary | ICD-10-CM | POA: Insufficient documentation

## 2022-12-13 DIAGNOSIS — R42 Dizziness and giddiness: Secondary | ICD-10-CM | POA: Insufficient documentation

## 2022-12-13 LAB — CBC WITH DIFFERENTIAL (CANCER CENTER ONLY)
Abs Immature Granulocytes: 0.02 10*3/uL (ref 0.00–0.07)
Basophils Absolute: 0 10*3/uL (ref 0.0–0.1)
Basophils Relative: 1 %
Eosinophils Absolute: 0.1 10*3/uL (ref 0.0–0.5)
Eosinophils Relative: 3 %
HCT: 35.4 % — ABNORMAL LOW (ref 36.0–46.0)
Hemoglobin: 11.4 g/dL — ABNORMAL LOW (ref 12.0–15.0)
Immature Granulocytes: 1 %
Lymphocytes Relative: 27 %
Lymphs Abs: 1.1 10*3/uL (ref 0.7–4.0)
MCH: 27.1 pg (ref 26.0–34.0)
MCHC: 32.2 g/dL (ref 30.0–36.0)
MCV: 84.1 fL (ref 80.0–100.0)
Monocytes Absolute: 0.4 10*3/uL (ref 0.1–1.0)
Monocytes Relative: 9 %
Neutro Abs: 2.5 10*3/uL (ref 1.7–7.7)
Neutrophils Relative %: 59 %
Platelet Count: 228 10*3/uL (ref 150–400)
RBC: 4.21 MIL/uL (ref 3.87–5.11)
RDW: 12.6 % (ref 11.5–15.5)
WBC Count: 4.2 10*3/uL (ref 4.0–10.5)
nRBC: 0 % (ref 0.0–0.2)

## 2022-12-13 LAB — CMP (CANCER CENTER ONLY)
ALT: 17 U/L (ref 0–44)
AST: 20 U/L (ref 15–41)
Albumin: 4.2 g/dL (ref 3.5–5.0)
Alkaline Phosphatase: 52 U/L (ref 38–126)
Anion gap: 6 (ref 5–15)
BUN: 19 mg/dL (ref 8–23)
CO2: 27 mmol/L (ref 22–32)
Calcium: 9.7 mg/dL (ref 8.9–10.3)
Chloride: 104 mmol/L (ref 98–111)
Creatinine: 0.98 mg/dL (ref 0.44–1.00)
GFR, Estimated: >60 mL/min (ref >60)
Glucose, Bld: 173 mg/dL — ABNORMAL HIGH (ref 70–99)
Potassium: 4.4 mmol/L (ref 3.5–5.1)
Sodium: 137 mmol/L (ref 135–145)
Total Bilirubin: 0.3 mg/dL (ref 0.3–1.2)
Total Protein: 7 g/dL (ref 6.5–8.1)

## 2022-12-13 LAB — FERRITIN: Ferritin: 6 ng/mL — ABNORMAL LOW (ref 11–307)

## 2022-12-13 LAB — IRON AND IRON BINDING CAPACITY (CC-WL,HP ONLY)
Iron: 59 ug/dL (ref 28–170)
Saturation Ratios: 13 % (ref 10.4–31.8)
TIBC: 473 ug/dL — ABNORMAL HIGH (ref 250–450)
UIBC: 414 ug/dL (ref 148–442)

## 2022-12-13 NOTE — Progress Notes (Signed)
[facility]   [phone] [phone]   Clinic Follow up Note   Patient Care Team: Rodrigo Ran, MD as PCP - General (Internal Medicine) Pricilla Riffle, MD as PCP - Cardiology (Cardiology) Exie Parody, MD (Hematology and Oncology) Heilingoetter, Johnette Abraham, PA-C as Physician Assistant (Oncology) Malachy Mood, MD as Consulting Physician (Oncology) Radonna Ricker, RN (Inactive) as Oncology Nurse Navigator Andria Meuse, MD as Consulting Physician (General Surgery)  Date of Service:  12/13/2022  CHIEF COMPLAINT: f/u of anal cancer   CURRENT THERAPY:  Surveillance   ASSESSMENT:  Melinda [name] is a 62 y.o. female with   Squamous cell cancer, anus (HCC) -Initially diagnosed on 07/13/19, s/p resection, pathology revealed invasive moderately differentiated squamous cell carcinoma, 1.4 cm, pT1. Staging CT 09/04/19 was negative. -She had local recurrence and underwent surgical excision under the care of Dr. Cliffton Asters on 01/11/20. -PET 01/28/20 showed no node or distant metastasis. -She was only able to complete 5 out of the planned 6 weeks of the standard concurrent chemoRT with Mitomycin and 5FU on due to poor tolerance of local skin irritation, N/V/D and had prolonged recovery -CT CAP on 06/05/21 showed NED. -most recent anoscopy with Dr. Cliffton Asters on 03/2022 was benign. -She is clinically doing well. Labs reviewed, overall stable. Physical exam was unremarkable. There is no high clinical concern for recurrence. -Continue surveillance.  -plan to repeat CT scan in 03/2023  Mild nausea -She reports mild intermittent nausea for the past week, no vomiting, her appetite is normal, no weight loss.  Her exam was unremarkable today, no abdominal tenderness -Will continue monitoring for now -She knows to call me back if she has persistent nausea in 2 to 3 weeks, then I will obtain a CT of abdomen pelvis for further evaluation -She has establish care with [facility]  Dr. Tomasa Rand, she is due for colonoscopy later this year.  If CT scan negative, she may need EGD also.   PLAN: - lab reviewed hg 11.4 - Iron study -pending -continue Cancer surveillance -repeat CT scan in 03/2023, or sooner if nausea persists  - I order CT CAP in 3 month, she will see Dr. Cliffton Asters after CT  -lab and f/u in 6 months   SUMMARY OF ONCOLOGIC HISTORY: Oncology History Overview Note   Cancer Staging  Squamous cell cancer, anus (HCC) Staging form: Anus, AJCC 8th Edition - Clinical stage from 01/11/2020: Stage I (cT1, cN0, cM0) - Signed by Malachy Mood, MD on 06/14/2022 Stage prefix: Initial diagnosis Histologic grade (G): G2 Histologic grading system: 4 grade system     Squamous cell cancer, anus (HCC)  07/13/2019 Procedure    EUA, Excision of posterior internal/external hemorrhoid under the care of Dr. Daphine Deutscher    07/13/2019 Pathology Results   - Invasive moderately differentiated squamous cell carcinoma, 1.4 cm.  See comment  - Carcinoma invades for depth of 0.4 cm  - Deep resection margin is negative for carcinoma (0.2 cm)  - Lateral mucosal margin is positive for high-grade dysplasia  - No evidence of lymphovascular perineural invasion  Procedure: Local excision  Tumor Site: Anal canal  Tumor Size: 1.4 cm  Histologic Type: Invasive squamous cell carcinoma  Histologic Grade: G2: Moderately differentiated  Tumor Extension: Carcinoma invades superficial anal sphincter muscle  Margins: Uninvolved by tumor  Treatment Effect: N/A  Regional Lymph Nodes: No lymph nodes submitted or found  Pathologic Stage Classification (pTNM, AJCC 8th Edition):  pT1, pNx  Representative Tumor Block: A1  Comment(s): Lateral mucosal margin is positive for high-grade squamous  dysplasia      07/13/2019 Initial Diagnosis   Squamous cell cancer, anus (HCC)   09/04/2019 Imaging   CT scan Chest, Abdomen, and Pelvis   IMPRESSION: 1. No evidence of metastatic disease in the chest, abdomen  or pelvis. 2. No discrete anorectal mass. 3. Chronic findings include: Punctate nonobstructing upper right renal stones and chronic right renal scarring. 4. Aortic Atherosclerosis (ICD10-I70.0).   01/10/2020 Pathology Results   A. ANAL LESION, POSTERIOR MIDLINE, EXCISION:  - Squamous cell carcinoma, moderately differentiated.  Verbally reported by Dr. Luisa Hart 1.5 cm, negative margins, depth <1 mm   01/11/2020 Procedure   1. Excision of anal canal lesion (posterior midline) under the care of Dr. Cliffton Asters     01/11/2020 Cancer Staging   Staging form: Anus, AJCC 8th Edition - Clinical stage from 01/11/2020: Stage I (cT1, cN0, cM0) - Signed by Malachy Mood, MD on 06/14/2022 Stage prefix: Initial diagnosis Histologic grade (G): G2 Histologic grading system: 4 grade system   01/28/2020 PET scan   IMPRESSION: 1. Mild focal anal hypermetabolism without discrete mass correlate on the CT images, nonspecific, differential includes postsurgical change or residual anal tumor. 2. No hypermetabolic locoregional or distant metastatic disease. 3. Chronic findings include: Aortic Atherosclerosis (ICD10-I70.0). Diffuse hepatic steatosis. Nonobstructing right nephrolithiasis.     02/04/2020 - 03/07/2020 Radiation Therapy   concurrent chemoRT by Dr Mitzi Hansen with Mitomycin and 5FU starting 02/04/20-03/07/20. The last 4 session cancelled due to poor toleration.    02/04/2020 - 03/03/2020 Chemotherapy   Concurrent chemoRT with Mitomycin and 5FU on week 1 and 5 starting 02/04/20-03/03/20   03/13/2020 Imaging   CT AP W contrast  IMPRESSION: Diffuse wall thickening with inflammatory changes involving a long segment of distal ileum, cecum, and sigmoid colon likely represents enteritis/colitis. This could be due to infectious, or inflammatory nature, not thought to represent ischemic colitis.   Hepatic steatosis   Trace pericardial effusion   Small free fluid in the deep pelvis   06/04/2020 Imaging    PET IMPRESSION: 1. Diminished metabolic uptake about the rectum with presumed post radiation changes showing mild perirectal stranding and perianal stranding on today's exam. Continued correlation with direct visualization may be helpful to exclude any residual tumor in this location but diminished FDG uptake is encouraging and findings could certainly be seen in the setting of post treatment change. 2. No signs of metastatic disease to the neck, chest, abdomen or pelvis. 3. Diminished perienteric stranding about distal small bowel with persistent mild stranding and stool like material in distal small bowel likely related to enteritis and associated with mild but diminished FDG uptake. Attention on follow-up. 4. Resolution of RIGHT-sided hydronephrosis with signs of nephrolithiasis as before. 5. Mild asymmetry of metabolic activity along the RIGHT chest wall associated serrated S musculature adjacent to the tip of the scapula felt to represent asymmetric muscular activity. Correlate with any symptoms in this area and suggest attention on follow-up.   Aortic Atherosclerosis (ICD10-I70.0).   06/05/2021 Imaging   EXAM: CT CHEST, ABDOMEN, AND PELVIS WITH CONTRAST  IMPRESSION: 1. No new or progressive findings to suggest metastatic disease in the chest, abdomen, or pelvis. 2. The wall thickening and perienteric edema associated with the terminal ileum on prior studies has resolved completely. Presacral and perirectal edema/stranding seen previously has also resolved. 3. Tiny bilateral pulmonary nodules, stable since prior PET-CT, consistent with benign etiology. 4. Right renal cortical scarring with tiny nonobstructing stones. 5. Aortic  Atherosclerosis (ICD10-I70.0).      INTERVAL HISTORY:  Melinda [name] is here for a follow up of anal cancer. She was last seen by me on 1/8/204. She presents to the clinic accompanied by husband. Pt state that she has indigestion and she get  nauseous. Pt state that she gets dizzy some times when she stands up. Pt state that her symptoms have been going on for about a week.  All other systems were reviewed with the patient and are negative.  MEDICAL HISTORY:  Past Medical History:  Diagnosis Date   anal ca dx'd 07/2019   Diabetes mellitus type 2, controlled (HCC)    Dyslipidemia    untreated   GERD (gastroesophageal reflux disease)    Hemorrhoids    Microcytic anemia 06/22/2012   Nephrolith    Palpitation    positive tilt table test. Prozac and Inderal treatment effective.    SURGICAL HISTORY: Past Surgical History:  Procedure Laterality Date   APPENDECTOMY     BREAST REDUCTION SURGERY     CARDIAC CATHETERIZATION  2002   normal LV function and no significant coronary obstruction   CATARACT EXTRACTION, BILATERAL Bilateral november 2020 and dcember 2020   CYST EXCISION N/A 01/11/2020   Procedure: EXCISION OF ANAL CANAL MASS;  Surgeon: Andria Meuse, MD;  Location: [facility];  Service: General;  Laterality: N/A;   CYSTOSCOPY/URETEROSCOPY/HOLMIUM LASER/STENT PLACEMENT Right 03/26/2020   Procedure: CYSTOSCOPY/URETEROSCOPY/HOLMIUM LASER/STENT PLACEMENT;  Surgeon: Crist Fat, MD;  Location: [facility];  Service: Urology;  Laterality: Right;   ESOPHAGEAL MANOMETRY N/A 04/04/2017   Procedure: ESOPHAGEAL MANOMETRY (EM);  Surgeon: Carman Ching, MD;  Location: [facility];  Service: Endoscopy;  Laterality: N/A;   HEMORRHOID SURGERY N/A 07/13/2019   Procedure: HEMORRHOIDECTOMY;  Surgeon: Luretha Murphy, MD;  Location: [facility];  Service: General;  Laterality: N/A;   KIDNEY SURGERY     kidney stones    PARTIAL HYSTERECTOMY     RECTAL EXAM UNDER ANESTHESIA N/A 01/11/2020   Procedure: RECTAL EXAM UNDER ANESTHESIA;  Surgeon: Andria Meuse, MD;  Location: [facility];  Service: General;  Laterality: N/A;   TONSILLECTOMY      I have reviewed the social history and family history with the patient and they  are unchanged from previous note.  ALLERGIES:  is allergic to cephalexin, amoxapine and related, amoxicillin-pot clavulanate, atorvastatin, cefaclor, ciprofloxacin, codeine, doxycycline calcium, fenofibrate micronized, fish oil, flagyl [metronidazole hcl], levofloxacin in d5w, metronidazole, ofloxacin, prednisolone, promethazine hcl, tape, tetracycline hcl, erythromycin, and nitrofurantoin.  MEDICATIONS:  Current Outpatient Medications  Medication Sig Dispense Refill   clorazepate (TRANXENE) 7.5 MG tablet Take 7.5 mg by mouth 2 (two) times daily.  1   docusate sodium (COLACE) 100 MG capsule Take 1 capsule (100 mg total) by mouth 2 (two) times daily. 30 capsule 0   esomeprazole (NEXIUM) 40 MG capsule Take 40 mg by mouth daily at 12 noon.     famotidine (PEPCID) 20 MG tablet Take 1 tablet (20 mg total) by mouth every 12 (twelve) hours as needed for heartburn or indigestion. 120 tablet 3   FLUoxetine (PROZAC) 20 MG capsule Take 20 mg by mouth every morning.     meclizine (ANTIVERT) 25 MG tablet Take 25 mg by mouth 2 (two) times daily as needed for dizziness.     metFORMIN (GLUCOPHAGE-XR) 500 MG 24 hr tablet Take 500 mg by mouth daily.     methenamine (HIPREX) 1 g tablet Take 1 g by mouth daily.  polyethylene glycol powder (GLYCOLAX/MIRALAX) 17 GM/SCOOP powder Take by mouth as needed.     propranolol (INDERAL) 10 MG tablet Take 1 tablet (10 mg total) by mouth daily. 90 tablet 0   rosuvastatin (CRESTOR) 20 MG tablet Take 20 mg by mouth daily.     No current facility-administered medications for this visit.    PHYSICAL EXAMINATION: ECOG PERFORMANCE STATUS: 0 - Asymptomatic  Vitals:   12/13/22 1318 12/13/22 1319  BP: 107/68 109/67  Pulse:    Resp:    Temp:    SpO2:     Wt Readings from Last 3 Encounters:  12/13/22 152 lb 3.2 oz (69 kg)  12/02/22 152 lb 3.2 oz (69 kg)  07/06/22 154 lb (69.9 kg)     GENERAL:alert, no distress and comfortable SKIN: skin color normal, no rashes or  significant lesions EYES: normal, Conjunctiva are pink and non-injected, sclera clear  NEURO: alert & oriented x 3 with fluent speech NECK: (-)supple, thyroid normal size, non-tender, without nodularity LYMPH: (-)  no palpable lymphadenopathy in the cervical, axillary  LUNGS: (-) clear to auscultation and percussion with normal breathing effort HEART: (-) regular rate & rhythm and no murmurs and no lower extremity edema RECTAL: Hemorrhoid, no palpable mass rectal exam benign.  LABORATORY DATA:  I have reviewed the data as listed    Latest Ref Rng & Units 12/13/2022   12:45 PM 06/14/2022    1:17 PM 12/11/2021   12:58 PM  CBC  WBC 4.0 - 10.5 K/uL 4.2  4.0  3.6   Hemoglobin 12.0 - 15.0 g/dL 16.1  09.6  04.5   Hematocrit 36.0 - 46.0 % 35.4  34.5  35.6   Platelets 150 - 400 K/uL 228  205  192         Latest Ref Rng & Units 12/13/2022   12:45 PM 06/14/2022    1:17 PM 03/26/2022    3:14 PM  CMP  Glucose 70 - 99 mg/dL 409  811    BUN 8 - 23 mg/dL 19  25    Creatinine 9.14 - 1.00 mg/dL 7.82  9.56  2.13   Sodium 135 - 145 mmol/L 137  139    Potassium 3.5 - 5.1 mmol/L 4.4  4.2    Chloride 98 - 111 mmol/L 104  107    CO2 22 - 32 mmol/L 27  28    Calcium 8.9 - 10.3 mg/dL 9.7  9.9    Total Protein 6.5 - 8.1 g/dL 7.0  6.5    Total Bilirubin 0.3 - 1.2 mg/dL 0.3  0.4    Alkaline Phos 38 - 126 U/L 52  51    AST 15 - 41 U/L 20  17    ALT 0 - 44 U/L 17  14        RADIOGRAPHIC STUDIES: I have personally reviewed the radiological images as listed and agreed with the findings in the report. No results found.    Orders Placed This Encounter  Procedures   CT CHEST ABDOMEN PELVIS W CONTRAST    Standing Status:   Future    Standing Expiration Date:   12/13/2023    Order Specific Question:   If indicated for the ordered procedure, I authorize the administration of contrast media per Radiology protocol    Answer:   Yes    Order Specific Question:   Does the patient have a contrast media/X-ray dye  allergy?    Answer:   No    Order  Specific Question:   Preferred imaging location?    Answer:   [facility]    Order Specific Question:   If indicated for the ordered procedure, I authorize the administration of oral contrast media per Radiology protocol    Answer:   Yes   All questions were answered. The patient knows to call the clinic with any problems, questions or concerns. No barriers to learning was detected. The total time spent in the appointment was 25 minutes.     Malachy Mood, MD 12/13/2022   Carolin Coy, CMA, am acting as scribe for Malachy Mood, MD.   I have reviewed the above documentation for accuracy and completeness, and I agree with the above.

## 2022-12-21 ENCOUNTER — Telehealth: Payer: Self-pay

## 2022-12-21 ENCOUNTER — Other Ambulatory Visit: Payer: Self-pay

## 2022-12-21 NOTE — Telephone Encounter (Signed)
Spoke with pt via telephone to inform pt that Dr. Mosetta Putt has reviewed her recent labs and her Ferritin is low 6.  Stated that Dr. Mosetta Putt would like for the pt to get 2 doses of Feraheme.  Also stated, Dr. Mosetta Putt would like for the pt to contact [facility] for an endoscopy since the pt has not had one in a few years.  Pt stated she's no longer going to [facility] but has an appt with another GI clinic.  Pt did not state the name of the new GI Clinic.  Pt also requested if her iron infusions could be done at [facility].  Sent orders to [facility] for [employer].

## 2022-12-27 ENCOUNTER — Ambulatory Visit: Payer: BC Managed Care – PPO

## 2022-12-27 VITALS — BP 109/70 | HR 73 | Temp 97.7°F | Resp 16 | Ht 66.0 in | Wt 152.2 lb

## 2022-12-27 DIAGNOSIS — D649 Anemia, unspecified: Secondary | ICD-10-CM | POA: Diagnosis not present

## 2022-12-27 DIAGNOSIS — C210 Malignant neoplasm of anus, unspecified: Secondary | ICD-10-CM | POA: Diagnosis not present

## 2022-12-27 DIAGNOSIS — D509 Iron deficiency anemia, unspecified: Secondary | ICD-10-CM

## 2022-12-27 MED ORDER — SODIUM CHLORIDE 0.9 % IV SOLN
510.0000 mg | Freq: Once | INTRAVENOUS | Status: AC
  Administered 2022-12-27: 510 mg via INTRAVENOUS
  Filled 2022-12-27: qty 17

## 2022-12-27 MED ORDER — ACETAMINOPHEN 325 MG PO TABS: 650.0000 mg | ORAL_TABLET | Freq: Once | ORAL | Status: DC

## 2022-12-27 MED ORDER — DIPHENHYDRAMINE HCL 25 MG PO CAPS: 25.0000 mg | ORAL_CAPSULE | Freq: Once | ORAL | Status: DC

## 2022-12-27 NOTE — Progress Notes (Signed)
Diagnosis: Iron Deficiency Anemia  Provider:  Chilton Greathouse MD  Procedure: IV Infusion  IV Type: Peripheral, IV Location: L Antecubital  Feraheme (Ferumoxytol), Dose: 510 mg  Infusion Start Time: 1338  Infusion Stop Time: 1400  Post Infusion IV Care: Observation period completed and Peripheral IV Discontinued  Discharge: Condition: Good, Destination: Home . AVS Provided  Performed by:  Garnette Czech, RN

## 2022-12-29 DIAGNOSIS — Z85048 Personal history of other malignant neoplasm of rectum, rectosigmoid junction, and anus: Secondary | ICD-10-CM | POA: Diagnosis not present

## 2022-12-29 DIAGNOSIS — C210 Malignant neoplasm of anus, unspecified: Secondary | ICD-10-CM | POA: Diagnosis not present

## 2023-01-04 ENCOUNTER — Ambulatory Visit: Payer: BC Managed Care – PPO

## 2023-01-04 VITALS — BP 122/72 | HR 79 | Temp 97.9°F | Resp 18 | Ht 66.0 in | Wt 159.0 lb

## 2023-01-04 DIAGNOSIS — D509 Iron deficiency anemia, unspecified: Secondary | ICD-10-CM | POA: Diagnosis not present

## 2023-01-04 DIAGNOSIS — C210 Malignant neoplasm of anus, unspecified: Secondary | ICD-10-CM | POA: Diagnosis not present

## 2023-01-04 DIAGNOSIS — D649 Anemia, unspecified: Secondary | ICD-10-CM | POA: Diagnosis not present

## 2023-01-04 MED ORDER — ACETAMINOPHEN 325 MG PO TABS: 650.0000 mg | ORAL_TABLET | Freq: Once | ORAL | Status: DC

## 2023-01-04 MED ORDER — SODIUM CHLORIDE 0.9 % IV SOLN
510.0000 mg | Freq: Once | INTRAVENOUS | Status: AC
  Administered 2023-01-04: 510 mg via INTRAVENOUS
  Filled 2023-01-04: qty 17

## 2023-01-04 MED ORDER — DIPHENHYDRAMINE HCL 25 MG PO CAPS: 25.0000 mg | ORAL_CAPSULE | Freq: Once | ORAL | Status: DC

## 2023-01-04 NOTE — Progress Notes (Signed)
Diagnosis: Iron Deficiency Anemia  Provider:  Chilton Greathouse MD  Procedure: IV Infusion  IV Type: Peripheral, IV Location: L Antecubital  Feraheme (Ferumoxytol), Dose: 510 mg  Infusion Start Time: 1249  Infusion Stop Time: 1304  Post Infusion IV Care: Patient declined observation and Peripheral IV Discontinued  Discharge: Condition: Good, Destination: Home . AVS Provided and AVS Declined  Performed by:  Adriana Mccallum, RN

## 2023-03-17 ENCOUNTER — Telehealth: Payer: Self-pay

## 2023-03-17 ENCOUNTER — Other Ambulatory Visit: Payer: Self-pay

## 2023-03-17 DIAGNOSIS — C210 Malignant neoplasm of anus, unspecified: Secondary | ICD-10-CM

## 2023-03-17 NOTE — Telephone Encounter (Signed)
LVM for pt stating that the pt's insurance will not approve for the pt to have a CT CAP w/contrast at [facility] but will approve for the pt to have the CT CAP done at [facility].  Stated that [facility] will contact the pt to get her scheduled for her appt.  Instructed pt to contact Dr. Latanya Maudlin office should she have additional questions or concerns.  Faxed order for CT CAP to [facility].  Fax confirmation received.  Requested if by could be scheduled no later than 03/28/2023.

## 2023-03-18 ENCOUNTER — Telehealth: Payer: Self-pay | Admitting: Hematology

## 2023-03-28 ENCOUNTER — Inpatient Hospital Stay: Payer: BC Managed Care – PPO

## 2023-03-28 ENCOUNTER — Ambulatory Visit (HOSPITAL_COMMUNITY): Payer: BC Managed Care – PPO

## 2023-03-29 ENCOUNTER — Inpatient Hospital Stay: Payer: BC Managed Care – PPO | Attending: Hematology

## 2023-03-29 ENCOUNTER — Other Ambulatory Visit: Payer: Self-pay

## 2023-03-29 DIAGNOSIS — Z85048 Personal history of other malignant neoplasm of rectum, rectosigmoid junction, and anus: Secondary | ICD-10-CM | POA: Diagnosis not present

## 2023-03-29 DIAGNOSIS — R110 Nausea: Secondary | ICD-10-CM | POA: Diagnosis not present

## 2023-03-29 DIAGNOSIS — C210 Malignant neoplasm of anus, unspecified: Secondary | ICD-10-CM

## 2023-03-29 LAB — CBC WITH DIFFERENTIAL (CANCER CENTER ONLY)
Abs Immature Granulocytes: 0.01 10*3/uL (ref 0.00–0.07)
Basophils Absolute: 0.1 10*3/uL (ref 0.0–0.1)
Basophils Relative: 1 %
Eosinophils Absolute: 0.1 10*3/uL (ref 0.0–0.5)
Eosinophils Relative: 3 %
HCT: 37.5 % (ref 36.0–46.0)
Hemoglobin: 12.5 g/dL (ref 12.0–15.0)
Immature Granulocytes: 0 %
Lymphocytes Relative: 26 %
Lymphs Abs: 1.1 10*3/uL (ref 0.7–4.0)
MCH: 27.9 pg (ref 26.0–34.0)
MCHC: 33.3 g/dL (ref 30.0–36.0)
MCV: 83.7 fL (ref 80.0–100.0)
Monocytes Absolute: 0.3 10*3/uL (ref 0.1–1.0)
Monocytes Relative: 7 %
Neutro Abs: 2.6 10*3/uL (ref 1.7–7.7)
Neutrophils Relative %: 63 %
Platelet Count: 220 10*3/uL (ref 150–400)
RBC: 4.48 MIL/uL (ref 3.87–5.11)
RDW: 12.9 % (ref 11.5–15.5)
WBC Count: 4.2 10*3/uL (ref 4.0–10.5)
nRBC: 0 % (ref 0.0–0.2)

## 2023-03-29 LAB — IRON AND IRON BINDING CAPACITY (CC-WL,HP ONLY)
Iron: 79 ug/dL (ref 28–170)
Saturation Ratios: 19 % (ref 10.4–31.8)
TIBC: 410 ug/dL (ref 250–450)
UIBC: 331 ug/dL (ref 148–442)

## 2023-03-29 LAB — FERRITIN: Ferritin: 42 ng/mL (ref 11–307)

## 2023-03-29 LAB — CMP (CANCER CENTER ONLY)
ALT: 37 U/L (ref 0–44)
AST: 28 U/L (ref 15–41)
Albumin: 4.4 g/dL (ref 3.5–5.0)
Alkaline Phosphatase: 51 U/L (ref 38–126)
Anion gap: 7 (ref 5–15)
BUN: 27 mg/dL — ABNORMAL HIGH (ref 8–23)
CO2: 27 mmol/L (ref 22–32)
Calcium: 10.1 mg/dL (ref 8.9–10.3)
Chloride: 103 mmol/L (ref 98–111)
Creatinine: 0.96 mg/dL (ref 0.44–1.00)
GFR, Estimated: >60 mL/min (ref >60)
Glucose, Bld: 281 mg/dL — ABNORMAL HIGH (ref 70–99)
Potassium: 4.1 mmol/L (ref 3.5–5.1)
Sodium: 137 mmol/L (ref 135–145)
Total Bilirubin: 0.4 mg/dL (ref 0.3–1.2)
Total Protein: 7.1 g/dL (ref 6.5–8.1)

## 2023-03-31 ENCOUNTER — Ambulatory Visit
Admission: RE | Admit: 2023-03-31 | Discharge: 2023-03-31 | Disposition: A | Discharge status: Home / Self Care | Payer: BC Managed Care – PPO | Source: Ambulatory Visit | Attending: Hematology | Admitting: Hematology

## 2023-03-31 DIAGNOSIS — C210 Malignant neoplasm of anus, unspecified: Secondary | ICD-10-CM

## 2023-03-31 DIAGNOSIS — Z85048 Personal history of other malignant neoplasm of rectum, rectosigmoid junction, and anus: Secondary | ICD-10-CM | POA: Diagnosis not present

## 2023-03-31 MED ORDER — IOPAMIDOL (ISOVUE-300) INJECTION 61%
200.0000 mL | Freq: Once | INTRAVENOUS | Status: AC | PRN
  Administered 2023-03-31: 90 mL via INTRAVENOUS

## 2023-04-01 ENCOUNTER — Other Ambulatory Visit (HOSPITAL_COMMUNITY): Payer: BC Managed Care – PPO

## 2023-04-06 ENCOUNTER — Encounter: Payer: Self-pay | Admitting: Hematology

## 2023-04-06 ENCOUNTER — Inpatient Hospital Stay: Payer: BC Managed Care – PPO | Admitting: Hematology

## 2023-04-06 DIAGNOSIS — C210 Malignant neoplasm of anus, unspecified: Secondary | ICD-10-CM | POA: Diagnosis not present

## 2023-04-06 DIAGNOSIS — R42 Dizziness and giddiness: Secondary | ICD-10-CM | POA: Diagnosis not present

## 2023-04-06 NOTE — Progress Notes (Signed)
 REFERRING PHYSICIAN:  Self  PROVIDER:  LONNI OZELL PIZZA, MD  MRN: [id number] DOB: 09/20/1960 DATE OF ENCOUNTER: 04/06/2023  Subjective   Chief Complaint: Referred by Dr. Gladis for evaluation of SCC found incidentally on hemorrhoidectomy   History of Present Illness: Melinda [name] is a 62 y.o. female with hx of HLD, GERD, DM whom underwent hemorrhoidectomy 07/13/19 by my partner, Dr. Gladis.  This was done for a prolapsing hemorrhoid that was also causing mucus drainage and perianal itching.  I reviewed the case with him.  He noted internal hemorrhoids at 10 and 2:00.  She had a history of banding Dr. Luis as well.  She had a prolapsing hemorrhoid in the posterior midline that had both squamous and columnar-appearing tissue on it.  The Harmonic scalpel was used to excise the posterior hemorrhoid.  She did well but did develop a bulbar rash after surgery that her primary doctor treated her with antibiotic course for.  Pathology returned invasive moderately differentiated squamous cell carcinoma, 1.4 cm.  Carcinoma invades 0.4 cm deep.  Deep resection margin is negative for carcinoma (0.2 cm).  Lateral mucosal margin positive for high-grade dysplasia.  No evidence of lymphovascular or perineural invasion.  They did note that the tumor extension invades the superficial anal sphincter muscle.  But they comment that the margins are uninvolved.  This was staged as a pT1Nx.  Her case was presented at our multidisciplinary tumor board.  She has a strong family history of malignancy and met criteria for genetics eval as well so referral placed.  She had a baseline set of CT scans done of the chest/abdomen/pelvis which demonstrated no abnormalities with regards to her anal lesion was excised.  She developed a boil in the interim on her left thigh that was lanced in the office and drained.  This returned with MRSA.  She was treated with antibiotics which she partially completed.  She denies  any issues with fevers/chills.  She denies any issues with ongoing drainage.  The wound is basically healed.  She denies any rectal bleeding, pain, or prolapsing tissue.  cXRT - missing 1 of 6 sessions, completed 03/07/20. She had some issues during the course of treatment including colitis and presumably an obstructive ureteral stone in the right ureter.  Also admitted with failure to thrive but that resolved  CT PET 06/04/20 shows diminished uptake in the rectum with presumed post radiation changes and mild perirectal stranding.  Diminished FDG uptake is noted to be encouraging.  No signs of metastatic disease to the neck chest abdomen or pelvis.  CT CAP 06/05/21 -  1. No new or progressive findings to suggest metastatic disease in the chest, abdomen, or pelvis. 2. The wall thickening and perienteric edema associated with the terminal ileum on prior studies has resolved completely. Presacral and perirectal edema/stranding seen previously has also resolved. 3. Tiny bilateral pulmonary nodules, stable since prior PET-CT, consistent with benign etiology. 4. Right renal cortical scarring with tiny nonobstructing stones. 5. Aortic Atherosclerosis (ICD10-I70.0).  CT CAP 03/26/22 1. Possible increase in the mild posterior lower rectal/perianal soft tissue thickening, which is nonspecific and may reflect posttreatment change, suggest correlation with direct visualization. 2. No evidence of metastatic disease within the chest, abdomen, or pelvis. 3. Stable tiny bilateral upper lobe pulmonary nodules, consistent with a benign finding. 4.  Aortic Atherosclerosis (ICD10-I70.0).  INTERVAL HX Had thrombosed Ext hem 12/2022 CT CAP 03/31/23 -  IMPRESSION: 1. Similar low rectal/perianal soft tissue thickening, nonspecific and possibly  reflecting posttreatment change. Suggest correlation with direct visualization. 2. No evidence of metastatic disease within the chest, abdomen or pelvis. 3. Stable  benign tiny pulmonary nodules. 4.  Aortic Atherosclerosis (ICD10-I70.0).  Returns to see me for follow-up.  She has been doing great.  Denies any complaints at present aside from some vertigo issues that she is dealt with for many years.  She has tried multiple maneuvers and multiple visits with her primary care but has not really achieved any sort of significant resolution.  Denies any anorectal pain, bleeding, or evident masses.  PMH: HTN, HLD, GERD  PSH: As above  FHx: Denies any known family history of colorectal, breast, endometrial or ovarian cancer  Social Hx: Denies use of tobacco/EtOH/illicit drug.    Medical History: Past Medical History:  Diagnosis Date  . Anemia   . Anxiety   . Arthritis   . Diabetes mellitus without complication (CMS/HHS-HCC)   . GERD (gastroesophageal reflux disease)   . History of cancer   . Hyperlipidemia     There is no problem list on file for this patient.   Past Surgical History:  Procedure Laterality Date  . APPENDECTOMY    . kidney stones    . REDUCTION MAMMAPLASTY    . TONSILLECTOMY       Allergies  Allergen Reactions  . Cephalexin  Anaphylaxis and Other (See Comments)    Kef tabs only (maybe dye) Capsules-anaphylxis Kef tabs only (maybe dye) Tolerated Keflex  inpatient in 2016   . Adhesive Tape-Silicones Other (See Comments)    Rash and blisters, only plastic tape  . Amoxapine Unknown  . Amoxicillin-Pot Clavulanate Other (See Comments)  . Atorvastatin Other (See Comments)    intolernace intolernace   . Cefaclor Other (See Comments)  . Ciprofloxacin  Nausea And Vomiting  . Codeine Hives  . Doxycycline Calcium  Nausea And Vomiting  . Fenofibrate Micronized Other (See Comments)    Cramping Cramping   . Fish Oil Other (See Comments)  . Levofloxacin In D5w Other (See Comments)    Unknown  Unknown    . Metronidazole  Nausea And Vomiting  . Other Other (See Comments)    Steroids-  Per pt  crazy  . Prednisolone Other  (See Comments)    Anxiety, palpitations   . Promethazine Hcl Other (See Comments)  . Sulfamethoxazole -Trimethoprim  Nausea and Nausea And Vomiting  . Tetracycline Hcl Other (See Comments)  . Antihistamines - Alkylamine Palpitations  . Erythromycin Rash  . Nitrofurantoin  Itching, Nausea And Vomiting and Rash    Current Outpatient Medications on File Prior to Visit  Medication Sig Dispense Refill  . clorazepate  (TRANXENE ) 7.5 MG tablet Take 7.5 mg by mouth 2 (two) times daily    . docusate (COLACE) 100 MG capsule Take 100 mg by mouth 2 (two) times daily    . esomeprazole  (NEXIUM ) 40 MG DR capsule Take by mouth    . FLUoxetine  (PROZAC ) 20 MG capsule Take 20 mg by mouth every morning    . methenamine  hippurate (HIPREX ) 1 gram tablet Take by mouth    . polyethylene glycol (MIRALAX ) powder Take by mouth    . propranoloL  (INDERAL ) 10 MG tablet Take 1 tablet by mouth once daily    . rosuvastatin  (CRESTOR ) 5 MG tablet Take 5 mg by mouth once daily    . oxyCODONE  (ROXICODONE ) 5 MG immediate release tablet      No current facility-administered medications on file prior to visit.    Family History  Problem Relation Age of Onset  .  Diabetes Mother   . Hyperlipidemia (Elevated cholesterol) Mother   . Coronary Artery Disease (Blocked arteries around heart) Mother   . Diabetes Father   . High blood pressure (Hypertension) Father   . Hyperlipidemia (Elevated cholesterol) Father   . Coronary Artery Disease (Blocked arteries around heart) Father   . Hyperlipidemia (Elevated cholesterol) Sister   . Diabetes Sister   . High blood pressure (Hypertension) Brother   . Coronary Artery Disease (Blocked arteries around heart) Brother   . Diabetes Brother   . Diabetes Other      Social History   Tobacco Use  Smoking Status Former  Smokeless Tobacco Never     Social History   Socioeconomic History  . Marital status: Married  Tobacco Use  . Smoking status: Former  . Smokeless tobacco: Never   Vaping Use  . Vaping status: Unknown  Substance and Sexual Activity  . Alcohol  use: Never  . Drug use: Never    Objective:    Vitals:   04/06/23 1052  PainSc: 0-No pain      There is no height or weight on file to calculate BMI.  Constitutional: NAD; conversant Eyes: Moist conjunctiva Lungs: Normal respiratory effort CV: RRR GI: Abdomen is soft, NT/ND. No palpable inguinal adenopathy. Anorectal: External hemorrhoidal thrombosis which is in the resolving phase-left posterior with ecchymotic skin and a punctate opening where it appears the clot has been evacuated/spontaneously drained. DRE - normal tone and squeeze, no palpable abnormalities Anoscopy: Circumferential anoscopy demonstrates normal-appearing anoderm.   Stable 2 column internal hemorrhoids which are decompressed.  There is no ulceration or mass.  There is no posterior midline fissuring or ulceration MSK: Normal range of motion of extremities Psychiatric: Appropriate affect  Procedure: Anoscopy We spent time reviewing the procedure, material risks (including but not limited to perianal pain, bleeding) benefits and alternatives.  Witnessed, informed consent was obtained.  Narrative:  The lubricated anoscope was gently inserted into the anal canal. Circumferential anoscopy demonstrates findings noted above  A chaperone, Coleta Molt, CMA was present for this encounter and examination  Labs, Imaging and Diagnostic Testing: I have personally reviewed the relevant notes from Dr. Lanny  Assessment and Plan:  Diagnoses and all orders for this visit:  Anal squamous cell carcinoma (CMS/HHS-HCC)  Vertigo      Melinda [name] is a very pleasant 62 y.o. female with hx of HTN, HLD, GERD hx of posterior midline hemorrhoidectomy - pathology revealed mod diff invasive SCC with negative margins but HGD laterally; deep margin was reported negative but also reported - MDT presentation - consensus was for  surveillance anoscopy moving forward  Developed local recurrence - underwent excision which showed SCC; now is s/p cXRT and in clinical remission.  May have developed some degree of RP fibrosis given findings on CTs including hydroureter during her XRT - this has clinically improved/resolved  CT CAP 03/2023 - no evidence of recurrent/metastatic disease -She has now had a thrombosed external hemorrhoid-left posterior, resolved -Discussed taking a single dose daily of MiraLAX  as a more prophylactic means to prevent constipation.  Work on staying hydrated, at least 5 to 6 glasses of water per day.  Minimizing commode times to 2 to 3 minutes. -We discussed things to watch out for moving forward  -We will place a referral to see Dr. Daniel Moccasin with ENT for her vertigo, appears refractory to exercises/treatments thus far with PCP   Return in about 6 months (around 10/05/2023).  I spent a total of 35  minutes in both face-to-face and non-face-to-face activities for this visit on the date of this encounter.  Lonni Pizza, MD [facility], [facility]

## 2023-04-12 ENCOUNTER — Encounter (INDEPENDENT_AMBULATORY_CARE_PROVIDER_SITE_OTHER): Payer: Self-pay | Admitting: Otolaryngology

## 2023-05-10 DIAGNOSIS — N3020 Other chronic cystitis without hematuria: Secondary | ICD-10-CM | POA: Diagnosis not present

## 2023-05-10 DIAGNOSIS — N200 Calculus of kidney: Secondary | ICD-10-CM | POA: Diagnosis not present

## 2023-05-16 ENCOUNTER — Inpatient Hospital Stay: Payer: BC Managed Care – PPO | Attending: Hematology | Admitting: Licensed Clinical Social Worker

## 2023-05-16 NOTE — Progress Notes (Signed)
[facility] Healthcare Advance Directives Clinical Social Work  Patient presented to Advance Directives Clinic  to review and complete healthcare advance directives.  Clinical Social Worker met with patient and pt's spouse.  The patient designated Quanita Wolverton (husband) as their primary healthcare agent and Catalia Lerew (son) as their secondary agent.  Patient also completed healthcare living will.    Documents were notarized and copies made for patient/family. Clinical Social Worker will send documents to medical records to be scanned into patient's chart. Clinical Social Worker encouraged patient/family to contact with any additional questions or concerns.   Koury Roddy E Robertson Colclough, LCSW Clinical Social Worker [facility]

## 2023-05-17 DIAGNOSIS — F3341 Major depressive disorder, recurrent, in partial remission: Secondary | ICD-10-CM | POA: Diagnosis not present

## 2023-05-18 ENCOUNTER — Encounter (INDEPENDENT_AMBULATORY_CARE_PROVIDER_SITE_OTHER): Payer: Self-pay

## 2023-05-18 ENCOUNTER — Ambulatory Visit (INDEPENDENT_AMBULATORY_CARE_PROVIDER_SITE_OTHER): Payer: BC Managed Care – PPO | Admitting: Otolaryngology

## 2023-05-18 VITALS — Ht 66.0 in | Wt 155.0 lb

## 2023-05-18 DIAGNOSIS — H8111 Benign paroxysmal vertigo, right ear: Secondary | ICD-10-CM | POA: Diagnosis not present

## 2023-05-18 DIAGNOSIS — R42 Dizziness and giddiness: Secondary | ICD-10-CM | POA: Insufficient documentation

## 2023-05-18 NOTE — Progress Notes (Signed)
Patient ID: Melinda [name], female   DOB: 1960-06-17, 62 y.o.   MRN: [id number]  CC: Recurrent dizziness  HPI:  Melinda [name] is a 62 y.o. female who presents today complaining of recurrent dizziness since her 95s.  However, the severity and frequency of her dizziness has increased over the past year.  She describes her dizziness as a spinning vertigo that lasts for several minutes.  It is often triggered with sudden head movement.  She denies any concurrent nausea, vomiting, headaches, otalgia, otorrhea, tinnitus, or hearing loss.  She was previously evaluated by Dr. Dillard Cannon.  Apparently no treatment was rendered.  The patient has been self treating with meclizine as needed.  She underwent adenotonsillectomy surgery at age 34.  She has no other ENT surgery.  Past Medical History:  Diagnosis Date   anal ca dx'd 07/2019   Diabetes mellitus type 2, controlled (HCC)    Dyslipidemia    untreated   GERD (gastroesophageal reflux disease)    Hemorrhoids    Microcytic anemia 06/22/2012   Nephrolith    Palpitation    positive tilt table test. Prozac and Inderal treatment effective.    Past Surgical History:  Procedure Laterality Date   APPENDECTOMY     BREAST REDUCTION SURGERY     CARDIAC CATHETERIZATION  2002   normal LV function and no significant coronary obstruction   CATARACT EXTRACTION, BILATERAL Bilateral november 2020 and dcember 2020   CYST EXCISION N/A 01/11/2020   Procedure: EXCISION OF ANAL CANAL MASS;  Surgeon: Andria Meuse, MD;  Location: [facility];  Service: General;  Laterality: N/A;   CYSTOSCOPY/URETEROSCOPY/HOLMIUM LASER/STENT PLACEMENT Right 03/26/2020   Procedure: CYSTOSCOPY/URETEROSCOPY/HOLMIUM LASER/STENT PLACEMENT;  Surgeon: Crist Fat, MD;  Location: [facility];  Service: Urology;  Laterality: Right;   ESOPHAGEAL MANOMETRY N/A 04/04/2017   Procedure: ESOPHAGEAL MANOMETRY (EM);  Surgeon: Carman Ching, MD;  Location: [facility];  Service: Endoscopy;   Laterality: N/A;   HEMORRHOID SURGERY N/A 07/13/2019   Procedure: HEMORRHOIDECTOMY;  Surgeon: Luretha Murphy, MD;  Location:  SURGERY CENTER;  Service: General;  Laterality: N/A;   KIDNEY SURGERY     kidney stones    PARTIAL HYSTERECTOMY     RECTAL EXAM UNDER ANESTHESIA N/A 01/11/2020   Procedure: RECTAL EXAM UNDER ANESTHESIA;  Surgeon: Andria Meuse, MD;  Location: [facility];  Service: General;  Laterality: N/A;   TONSILLECTOMY      Family History  Problem Relation Age of Onset   Heart disease Mother    Liver disease Mother    Heart disease Father    Heart attack Father    Cancer Father        prostate CA   Cancer Maternal Grandmother 47       ovarian cancer   Coronary artery disease Other        Fx of   Heart attack Other    Diabetes Other     Social History:  reports that she quit smoking about 5 years ago. Her smoking use included e-cigarettes. She has never used smokeless tobacco. She reports that she does not drink alcohol and does not use drugs.  Allergies:  Allergies  Allergen Reactions   Cephalexin Anaphylaxis and Other (See Comments)    Kef tabs only (maybe dye) Tolerated Keflex inpatient in 2016   Amoxapine And Related    Amoxicillin-Pot Clavulanate Other (See Comments)   Atorvastatin Other (See Comments)    intolernace   Cefaclor Other (See Comments)  Ciprofloxacin Nausea And Vomiting   Codeine Hives   Doxycycline Calcium Nausea And Vomiting   Fenofibrate Micronized Other (See Comments)    Cramping   Fish Oil Other (See Comments)   Flagyl [Metronidazole Hcl]    Levofloxacin In D5w Other (See Comments)    Unknown    Metronidazole Nausea And Vomiting   Ofloxacin Other (See Comments)   Prednisolone Other (See Comments)    Anxiety, palpitations   Promethazine Hcl Other (See Comments)   Tape Other (See Comments)    Rash and blisters, only plastic tape   Tetracycline Hcl Other (See Comments)   Erythromycin Rash   Nitrofurantoin Itching,  Nausea And Vomiting and Rash    Prior to Admission medications   Medication Sig Start Date End Date Taking? Authorizing Provider  clorazepate (TRANXENE) 7.5 MG tablet Take 7.5 mg by mouth 2 (two) times daily. 02/12/18  Yes [provider]  docusate sodium (COLACE) 100 MG capsule Take 1 capsule (100 mg total) by mouth 2 (two) times daily. 04/09/20  Yes Jerald Kief, MD  esomeprazole (NEXIUM) 40 MG capsule Take 40 mg by mouth daily at 12 noon.   Yes [provider]  famotidine (PEPCID) 20 MG tablet Take 1 tablet (20 mg total) by mouth every 12 (twelve) hours as needed for heartburn or indigestion. 05/21/22  Yes Jenel Lucks, MD  FLUoxetine (PROZAC) 20 MG capsule Take 20 mg by mouth every morning. 10/23/19  Yes [provider]  meclizine (ANTIVERT) 25 MG tablet Take 25 mg by mouth 2 (two) times daily as needed for dizziness. 01/11/22  Yes [provider]  metFORMIN (GLUCOPHAGE-XR) 500 MG 24 hr tablet Take 500 mg by mouth daily. 08/05/21  Yes [provider]  methenamine (HIPREX) 1 g tablet Take 1 g by mouth daily.    Yes [provider]  polyethylene glycol powder (GLYCOLAX/MIRALAX) 17 GM/SCOOP powder Take by mouth as needed. 04/10/20  Yes [provider]  propranolol (INDERAL) 10 MG tablet Take 1 tablet (10 mg total) by mouth daily. 12/02/22  Yes Pricilla Riffle, MD  rosuvastatin (CRESTOR) 20 MG tablet Take 20 mg by mouth daily.   Yes [provider]    Height 5\' 6"  (1.676 m), weight 155 lb (70.3 kg). Exam: General: Communicates without difficulty, well nourished, no acute distress. Head: Normocephalic, no evidence injury, no tenderness, facial buttresses intact without stepoff. Face/sinus: No tenderness to palpation and percussion. Facial movement is normal and symmetric. Eyes: PERRL, EOMI. No scleral icterus, conjunctivae clear. Neuro: CN II exam reveals vision grossly intact.  No nystagmus at any point of gaze. Ears: Auricles  well formed without lesions.  Ear canals are intact without mass or lesion.  No erythema or edema is appreciated.  The TMs are intact without fluid. Nose: External evaluation reveals normal support and skin without lesions.  Dorsum is intact.  Anterior rhinoscopy reveals congested mucosa over anterior aspect of inferior turbinates and intact septum.  No purulence noted. Oral:  Oral cavity and oropharynx are intact, symmetric, without erythema or edema.  Mucosa is moist without lesions. Neck: Full range of motion without pain.  There is no significant lymphadenopathy.  No masses palpable.  Thyroid bed within normal limits to palpation.  Parotid glands and submandibular glands equal bilaterally without mass.  Trachea is midline. Neuro:  CN 2-12 grossly intact. Vestibular: Dix-Hallpike produces rotational nystagmus with right-sided positioning. Vestibular: There is no nystagmus with pneumatic pressure on either tympanic membrane or Valsalva. The cerebellar  examination is unremarkable.    Procedure: Right-sided Epley maneuver  Anesthesia: None Indication: To treat the right BPPV Desciption:  The patient is first placed in a right-sided Dix-Hallpike position. After the vertigo has subsided, the head is gradually rotated from the right to the left, completing a 180 turn. The patient is then returned to the upright position. The patient tolerated the procedure well without any difficulty.    Assessment: 1.  Recurrent dizziness, likely secondary to right benign paroxysmal positional vertigo. 2.  Her right Dix-Hallpike maneuver is positive. 3.  Her ear canals, tympanic membranes, and middle ear spaces are otherwise normal.  Plan: 1.  The physical exam findings are reviewed with the patient. 2.  The pathophysiology of BPPV and dizziness are extensively discussed with the patient.  Other possible differential diagnoses are also discussed.  Questions are invited and answered. 3.  The right Epley maneuver is  performed today without difficulty. 4.  Post Epley activity restrictions are discussed. 5.  The patient will return for reevaluation in 1 month, sooner if needed.  Gerldine Suleiman W Lilie Vezina 05/18/2023, 6:36 PM

## 2023-05-20 ENCOUNTER — Other Ambulatory Visit: Payer: Self-pay

## 2023-05-20 ENCOUNTER — Telehealth: Payer: Self-pay | Admitting: Internal Medicine

## 2023-05-20 MED ORDER — PROPRANOLOL HCL 10 MG PO TABS: 10.0000 mg | ORAL_TABLET | Freq: Every day | ORAL | 1 refills | Status: DC

## 2023-05-20 NOTE — Telephone Encounter (Signed)
*  STAT* If patient is at the pharmacy, call can be transferred to refill team.   1. Which medications need to be refilled? (please list name of each medication and dose if known)   propranolol (INDERAL) 10 MG tablet    2. Which pharmacy/location (including street and city if local pharmacy) is medication to be sent to?  [id number] [place], [place] - [address]    3. Do they need a 30 day or 90 day supply? 90

## 2023-06-19 ENCOUNTER — Other Ambulatory Visit: Payer: Self-pay | Admitting: Nurse Practitioner

## 2023-06-19 DIAGNOSIS — C210 Malignant neoplasm of anus, unspecified: Secondary | ICD-10-CM

## 2023-06-19 NOTE — Assessment & Plan Note (Deleted)
-  Initially diagnosed on 07/13/19, s/p resection, pathology revealed invasive moderately differentiated squamous cell carcinoma, 1.4 cm, pT1. Staging CT 09/04/19 was negative. -She had local recurrence and underwent surgical excision under the care of Dr. Teresa on 01/11/20. -PET 01/28/20 showed no node or distant metastasis. -She was only able to complete 5 out of the planned 6 weeks of the standard concurrent chemoRT with Mitomycin  and 5FU due to poor tolerance and local skin irritation, N/V/D and had prolonged recovery -CT CAP on 06/05/21 showed NED. -most recent anoscopy with Dr. Teresa on 03/2022 was benign. -She is clinically doing well. Labs reviewed, overall stable. Physical exam was unremarkable. There is no high clinical concern for recurrence. -Continue surveillance.  -03/31/2023 CT CAP showed similar low rectal/perianal soft tissue thickening, nonspecific and likely reflecting posttreatment change.  No evidence of metastatic disease within the chest, abdomen, or pelvis.  There were stable benign tiny pulmonary nodules.

## 2023-06-19 NOTE — Progress Notes (Deleted)
 Patient Care Team: Shayne Anes, MD as PCP - General (Internal Medicine) Okey Vina GAILS, MD as PCP - Cardiology (Cardiology) Twana Jeneal DASEN, MD (Hematology and Oncology) Heilingoetter, Calton CROME, PA-C as Physician Assistant (Oncology) Lanny Callander, MD as Consulting Physician (Oncology) Lenon Channing CROME, RN (Inactive) as Oncology Nurse Navigator Teresa Lonni HERO, MD as Consulting Physician (General Surgery) Diagnostic Radiology & Imaging, [facility] as Radiologist (Diagnostic Radiology)  Clinic Day:  06/20/2023  Referring physician: Shayne Anes, MD  ASSESSMENT & PLAN:   Assessment & Plan: Squamous cell cancer, anus (HCC) -Initially diagnosed on 07/13/19, s/p resection, pathology revealed invasive moderately differentiated squamous cell carcinoma, 1.4 cm, pT1. Staging CT 09/04/19 was negative. -She had local recurrence and underwent surgical excision under the care of Dr. Teresa on 01/11/20. -PET 01/28/20 showed no node or distant metastasis. -She was only able to complete 5 out of the planned 6 weeks of the standard concurrent chemoRT with Mitomycin  and 5FU due to poor tolerance and local skin irritation, N/V/D and had prolonged recovery -CT CAP on 06/05/21 showed NED. -most recent anoscopy with Dr. Teresa on 03/2022 was benign. -She is clinically doing well. Labs reviewed, overall stable. Physical exam was unremarkable. There is no high clinical concern for recurrence. -Continue surveillance.  -03/31/2023 CT CAP showed similar low rectal/perianal soft tissue thickening, nonspecific and likely reflecting posttreatment change.  No evidence of metastatic disease within the chest, abdomen, or pelvis.  There were stable benign tiny pulmonary nodules.    The patient understands the plans discussed today and is in agreement with them.  She knows to contact our office if she develops concerns prior to her next appointment.  I provided *** minutes of face-to-face time during this encounter and > 50% was  spent counseling as documented under my assessment and plan.    Powell FORBES Lessen, NP  [facility] - A DEPT OF [facility] [address] [place] [address] Dept: [phone] Dept Fax: [phone]   No orders of the defined types were placed in this encounter.     CHIEF COMPLAINT:  CC: Anal cancer  Current Treatment: Surveillance  INTERVAL HISTORY:  Melinda [name] is here today for repeat clinical assessment.  She was last seen by Dr. Lanny on 12/13/2022. CT CAP was done 03/31/2023.  It is shown similar low rectal/perianal soft tissue thickening, nonspecific and likely reflecting posttreatment change.  No evidence of metastatic disease within the chest, abdomen, or pelvis.  There were stable benign tiny pulmonary nodules.  She denies fevers or chills. She denies pain. Her appetite is good. Her weight {Weight change:10426}.  I have reviewed the past medical history, past surgical history, social history and family history with the patient and they are unchanged from previous note.  ALLERGIES:  is allergic to cephalexin , amoxapine and related, amoxicillin-pot clavulanate, atorvastatin, cefaclor, ciprofloxacin , codeine, doxycycline calcium , fenofibrate micronized, fish oil, flagyl  [metronidazole  hcl], levofloxacin in d5w, metronidazole , ofloxacin, prednisolone, promethazine hcl, tape, tetracycline hcl, erythromycin, and nitrofurantoin .  MEDICATIONS:  Current Outpatient Medications  Medication Sig Dispense Refill   clorazepate  (TRANXENE ) 7.5 MG tablet Take 7.5 mg by mouth 2 (two) times daily.  1   docusate sodium  (COLACE) 100 MG capsule Take 1 capsule (100 mg total) by mouth 2 (two) times daily. 30 capsule 0   esomeprazole  (NEXIUM ) 40 MG capsule Take 40 mg by mouth daily at 12 noon.     famotidine  (PEPCID ) 20 MG tablet Take 1 tablet (  20 mg total) by mouth every 12 (twelve) hours as needed for heartburn or indigestion. 120 tablet  3   FLUoxetine  (PROZAC ) 20 MG capsule Take 20 mg by mouth every morning.     meclizine (ANTIVERT) 25 MG tablet Take 25 mg by mouth 2 (two) times daily as needed for dizziness.     metFORMIN  (GLUCOPHAGE -XR) 500 MG 24 hr tablet Take 500 mg by mouth daily.     methenamine  (HIPREX ) 1 g tablet Take 1 g by mouth daily.      polyethylene glycol powder (GLYCOLAX /MIRALAX ) 17 GM/SCOOP powder Take by mouth as needed.     propranolol  (INDERAL ) 10 MG tablet Take 1 tablet (10 mg total) by mouth daily. 90 tablet 1   rosuvastatin  (CRESTOR ) 20 MG tablet Take 20 mg by mouth daily.     No current facility-administered medications for this visit.    HISTORY OF PRESENT ILLNESS:   Oncology History Overview Note   Cancer Staging  Squamous cell cancer, anus (HCC) Staging form: Anus, AJCC 8th Edition - Clinical stage from 01/11/2020: Stage I (cT1, cN0, cM0) - Signed by Lanny Callander, MD on 06/14/2022 Stage prefix: Initial diagnosis Histologic grade (G): G2 Histologic grading system: 4 grade system     Squamous cell cancer, anus (HCC)  07/13/2019 Procedure    EUA, Excision of posterior internal/external hemorrhoid under the care of Dr. Gladis    07/13/2019 Pathology Results   - Invasive moderately differentiated squamous cell carcinoma, 1.4 cm.  See comment  - Carcinoma invades for depth of 0.4 cm  - Deep resection margin is negative for carcinoma (0.2 cm)  - Lateral mucosal margin is positive for high-grade dysplasia  - No evidence of lymphovascular perineural invasion  Procedure: Local excision  Tumor Site: Anal canal  Tumor Size: 1.4 cm  Histologic Type: Invasive squamous cell carcinoma  Histologic Grade: G2: Moderately differentiated  Tumor Extension: Carcinoma invades superficial anal sphincter muscle  Margins: Uninvolved by tumor  Treatment Effect: N/A  Regional Lymph Nodes: No lymph nodes submitted or found  Pathologic Stage Classification (pTNM, AJCC 8th Edition):  pT1, pNx  Representative Tumor  Block: A1  Comment(s): Lateral mucosal margin is positive for high-grade squamous  dysplasia      07/13/2019 Initial Diagnosis   Squamous cell cancer, anus (HCC)   09/04/2019 Imaging   CT scan Chest, Abdomen, and Pelvis   IMPRESSION: 1. No evidence of metastatic disease in the chest, abdomen or pelvis. 2. No discrete anorectal mass. 3. Chronic findings include: Punctate nonobstructing upper right renal stones and chronic right renal scarring. 4. Aortic Atherosclerosis (ICD10-I70.0).   01/10/2020 Pathology Results   A. ANAL LESION, POSTERIOR MIDLINE, EXCISION:  - Squamous cell carcinoma, moderately differentiated.  Verbally reported by Dr. Belvie 1.5 cm, negative margins, depth <1 mm   01/11/2020 Procedure   1. Excision of anal canal lesion (posterior midline) under the care of Dr. Teresa     01/11/2020 Cancer Staging   Staging form: Anus, AJCC 8th Edition - Clinical stage from 01/11/2020: Stage I (cT1, cN0, cM0) - Signed by Lanny Callander, MD on 06/14/2022 Stage prefix: Initial diagnosis Histologic grade (G): G2 Histologic grading system: 4 grade system   01/28/2020 PET scan   IMPRESSION: 1. Mild focal anal hypermetabolism without discrete mass correlate on the CT images, nonspecific, differential includes postsurgical change or residual anal tumor. 2. No hypermetabolic locoregional or distant metastatic disease. 3. Chronic findings include: Aortic Atherosclerosis (ICD10-I70.0). Diffuse hepatic steatosis. Nonobstructing right nephrolithiasis.  02/04/2020 - 03/07/2020 Radiation Therapy   concurrent chemoRT by Dr Dewey with Mitomycin  and 5FU starting 02/04/20-03/07/20. The last 4 session cancelled due to poor toleration.    02/04/2020 - 03/03/2020 Chemotherapy   Concurrent chemoRT with Mitomycin  and 5FU on week 1 and 5 starting 02/04/20-03/03/20   03/13/2020 Imaging   CT AP W contrast  IMPRESSION: Diffuse wall thickening with inflammatory changes involving a long segment of distal  ileum, cecum, and sigmoid colon likely represents enteritis/colitis. This could be due to infectious, or inflammatory nature, not thought to represent ischemic colitis.   Hepatic steatosis   Trace pericardial effusion   Small free fluid in the deep pelvis   06/04/2020 Imaging   PET IMPRESSION: 1. Diminished metabolic uptake about the rectum with presumed post radiation changes showing mild perirectal stranding and perianal stranding on today's exam. Continued correlation with direct visualization may be helpful to exclude any residual tumor in this location but diminished FDG uptake is encouraging and findings could certainly be seen in the setting of post treatment change. 2. No signs of metastatic disease to the neck, chest, abdomen or pelvis. 3. Diminished perienteric stranding about distal small bowel with persistent mild stranding and stool like material in distal small bowel likely related to enteritis and associated with mild but diminished FDG uptake. Attention on follow-up. 4. Resolution of RIGHT-sided hydronephrosis with signs of nephrolithiasis as before. 5. Mild asymmetry of metabolic activity along the RIGHT chest wall associated serrated S musculature adjacent to the tip of the scapula felt to represent asymmetric muscular activity. Correlate with any symptoms in this area and suggest attention on follow-up.   Aortic Atherosclerosis (ICD10-I70.0).   06/05/2021 Imaging   EXAM: CT CHEST, ABDOMEN, AND PELVIS WITH CONTRAST  IMPRESSION: 1. No new or progressive findings to suggest metastatic disease in the chest, abdomen, or pelvis. 2. The wall thickening and perienteric edema associated with the terminal ileum on prior studies has resolved completely. Presacral and perirectal edema/stranding seen previously has also resolved. 3. Tiny bilateral pulmonary nodules, stable since prior PET-CT, consistent with benign etiology. 4. Right renal cortical scarring with  tiny nonobstructing stones. 5. Aortic Atherosclerosis (ICD10-I70.0).   03/31/2023 Imaging   CT chest abdomen pelvis with contrast IMPRESSION: 1. Similar low rectal/perianal soft tissue thickening, nonspecific and possibly reflecting posttreatment change. Suggest correlation with direct visualization. 2. No evidence of metastatic disease within the chest, abdomen or pelvis. 3. Stable benign tiny pulmonary nodules. 4.  Aortic Atherosclerosis (ICD10-I70.0).       REVIEW OF SYSTEMS:   Constitutional: Denies fevers, chills or abnormal weight loss Eyes: Denies blurriness of vision Ears, nose, mouth, throat, and face: Denies mucositis or sore throat Respiratory: Denies cough, dyspnea or wheezes Cardiovascular: Denies palpitation, chest discomfort or lower extremity swelling Gastrointestinal:  Denies nausea, heartburn or change in bowel habits Skin: Denies abnormal skin rashes Lymphatics: Denies new lymphadenopathy or easy bruising Neurological:Denies numbness, tingling or new weaknesses Behavioral/Psych: Mood is stable, no new changes  All other systems were reviewed with the patient and are negative.   VITALS:  There were no vitals taken for this visit.  Wt Readings from Last 3 Encounters:  05/18/23 155 lb (70.3 kg)  01/04/23 159 lb (72.1 kg)  12/27/22 152 lb 3.2 oz (69 kg)    There is no height or weight on file to calculate BMI.  Performance status (ECOG): {CHL ONC [id number]  PHYSICAL EXAM:   GENERAL:alert, no distress and comfortable SKIN: skin color, texture, turgor are normal, no rashes  or significant lesions EYES: normal, Conjunctiva are pink and non-injected, sclera clear OROPHARYNX:no exudate, no erythema and lips, buccal mucosa, and tongue normal  NECK: supple, thyroid  normal size, non-tender, without nodularity LYMPH:  no palpable lymphadenopathy in the cervical, axillary or inguinal LUNGS: clear to auscultation and percussion with normal breathing  effort HEART: regular rate & rhythm and no murmurs and no lower extremity edema ABDOMEN:abdomen soft, non-tender and normal bowel sounds Musculoskeletal:no cyanosis of digits and no clubbing  NEURO: alert & oriented x 3 with fluent speech, no focal motor/sensory deficits  LABORATORY DATA:  I have reviewed the data as listed    Component Value Date/Time   NA 137 03/29/2023 1332   K 4.1 03/29/2023 1332   CL 103 03/29/2023 1332   CO2 27 03/29/2023 1332   GLUCOSE 281 (H) 03/29/2023 1332   BUN 27 (H) 03/29/2023 1332   CREATININE 0.96 03/29/2023 1332   CALCIUM  10.1 03/29/2023 1332   PROT 7.1 03/29/2023 1332   ALBUMIN 4.4 03/29/2023 1332   AST 28 03/29/2023 1332   ALT 37 03/29/2023 1332   ALKPHOS 51 03/29/2023 1332   BILITOT 0.4 03/29/2023 1332   GFRNONAA >60 03/29/2023 1332   GFRAA >60 03/10/2020 1253    No results found for: SPEP, UPEP  Lab Results  Component Value Date   WBC 4.2 03/29/2023   NEUTROABS 2.6 03/29/2023   HGB 12.5 03/29/2023   HCT 37.5 03/29/2023   MCV 83.7 03/29/2023   PLT 220 03/29/2023      Chemistry      Component Value Date/Time   NA 137 03/29/2023 1332   K 4.1 03/29/2023 1332   CL 103 03/29/2023 1332   CO2 27 03/29/2023 1332   BUN 27 (H) 03/29/2023 1332   CREATININE 0.96 03/29/2023 1332      Component Value Date/Time   CALCIUM  10.1 03/29/2023 1332   ALKPHOS 51 03/29/2023 1332   AST 28 03/29/2023 1332   ALT 37 03/29/2023 1332   BILITOT 0.4 03/29/2023 1332       RADIOGRAPHIC STUDIES: I have personally reviewed the radiological images as listed and agreed with the findings in the report. No results found.

## 2023-06-20 ENCOUNTER — Other Ambulatory Visit: Payer: Self-pay

## 2023-06-20 ENCOUNTER — Inpatient Hospital Stay: Payer: BC Managed Care – PPO | Admitting: Nurse Practitioner

## 2023-06-20 ENCOUNTER — Inpatient Hospital Stay: Payer: BC Managed Care – PPO | Attending: Hematology | Admitting: Nurse Practitioner

## 2023-06-20 DIAGNOSIS — Z85048 Personal history of other malignant neoplasm of rectum, rectosigmoid junction, and anus: Secondary | ICD-10-CM | POA: Diagnosis not present

## 2023-06-20 DIAGNOSIS — R5383 Other fatigue: Secondary | ICD-10-CM | POA: Diagnosis not present

## 2023-06-20 DIAGNOSIS — D509 Iron deficiency anemia, unspecified: Secondary | ICD-10-CM | POA: Diagnosis not present

## 2023-06-20 DIAGNOSIS — C210 Malignant neoplasm of anus, unspecified: Secondary | ICD-10-CM

## 2023-06-20 DIAGNOSIS — R918 Other nonspecific abnormal finding of lung field: Secondary | ICD-10-CM | POA: Diagnosis not present

## 2023-06-20 LAB — CMP (CANCER CENTER ONLY)
ALT: 30 U/L (ref 0–44)
AST: 28 U/L (ref 15–41)
Albumin: 4.4 g/dL (ref 3.5–5.0)
Alkaline Phosphatase: 72 U/L (ref 38–126)
Anion gap: 8 (ref 5–15)
BUN: 23 mg/dL (ref 8–23)
CO2: 27 mmol/L (ref 22–32)
Calcium: 10 mg/dL (ref 8.9–10.3)
Chloride: 104 mmol/L (ref 98–111)
Creatinine: 0.99 mg/dL (ref 0.44–1.00)
GFR, Estimated: >60 mL/min (ref >60)
Glucose, Bld: 229 mg/dL — ABNORMAL HIGH (ref 70–99)
Potassium: 3.8 mmol/L (ref 3.5–5.1)
Sodium: 139 mmol/L (ref 135–145)
Total Bilirubin: 0.4 mg/dL (ref 0.0–1.2)
Total Protein: 7.2 g/dL (ref 6.5–8.1)

## 2023-06-20 LAB — CBC WITH DIFFERENTIAL (CANCER CENTER ONLY)
Abs Immature Granulocytes: 0.01 10*3/uL (ref 0.00–0.07)
Basophils Absolute: 0 10*3/uL (ref 0.0–0.1)
Basophils Relative: 1 %
Eosinophils Absolute: 0.1 10*3/uL (ref 0.0–0.5)
Eosinophils Relative: 3 %
HCT: 36.4 % (ref 36.0–46.0)
Hemoglobin: 11.6 g/dL — ABNORMAL LOW (ref 12.0–15.0)
Immature Granulocytes: 0 %
Lymphocytes Relative: 21 %
Lymphs Abs: 0.9 10*3/uL (ref 0.7–4.0)
MCH: 25.9 pg — ABNORMAL LOW (ref 26.0–34.0)
MCHC: 31.9 g/dL (ref 30.0–36.0)
MCV: 81.3 fL (ref 80.0–100.0)
Monocytes Absolute: 0.4 10*3/uL (ref 0.1–1.0)
Monocytes Relative: 8 %
Neutro Abs: 3 10*3/uL (ref 1.7–7.7)
Neutrophils Relative %: 67 %
Platelet Count: 216 10*3/uL (ref 150–400)
RBC: 4.48 MIL/uL (ref 3.87–5.11)
RDW: 12.6 % (ref 11.5–15.5)
WBC Count: 4.5 10*3/uL (ref 4.0–10.5)
nRBC: 0 % (ref 0.0–0.2)

## 2023-06-20 LAB — IRON AND IRON BINDING CAPACITY (CC-WL,HP ONLY)
Iron: 59 ug/dL (ref 28–170)
Saturation Ratios: 13 % (ref 10.4–31.8)
TIBC: 461 ug/dL — ABNORMAL HIGH (ref 250–450)
UIBC: 402 ug/dL (ref 148–442)

## 2023-06-20 LAB — FERRITIN: Ferritin: 12 ng/mL (ref 11–307)

## 2023-06-20 NOTE — Progress Notes (Signed)
 In general, labs good. Discuss with patient during visit 06/24/2023.

## 2023-06-21 ENCOUNTER — Ambulatory Visit (INDEPENDENT_AMBULATORY_CARE_PROVIDER_SITE_OTHER): Payer: BC Managed Care – PPO

## 2023-06-22 DIAGNOSIS — E119 Type 2 diabetes mellitus without complications: Secondary | ICD-10-CM | POA: Diagnosis not present

## 2023-06-23 NOTE — Assessment & Plan Note (Addendum)
-  Initially diagnosed on 07/13/19, s/p resection, pathology revealed invasive moderately differentiated squamous cell carcinoma, 1.4 cm, pT1. Staging CT 09/04/19 was negative. -She had local recurrence and underwent surgical excision under the care of Dr. Cliffton Asters on 01/11/20. -PET 01/28/20 showed no node or distant metastasis. -She was only able to complete 5 out of the planned 6 weeks of the standard concurrent chemoRT with Mitomycin and 5FU due to poor tolerance and local skin irritation, N/V/D and had prolonged recovery -CT CAP on 06/05/21 showed NED. -most recent anoscopy with Dr. Cliffton Asters on 03/2022 was benign. -She is clinically doing well. Labs reviewed, overall stable. Physical exam was unremarkable. There is no high clinical concern for recurrence. -Continue surveillance.  -03/31/2023 CT CAP showed similar low rectal/perianal soft tissue thickening, nonspecific and likely reflecting posttreatment change.  No evidence of metastatic disease within the chest, abdomen, or pelvis.  There were stable benign tiny pulmonary nodules. -06/24/2023 - labs indicate mild anemia with moderate iron deficiency. Will have her set up for IV iron treatments. Insurance prefers Venofer. Will get 5 treatments of 200 mg spaced 1 week apart. Treatments to be administered at [facility]. Will see her back in approximately 8 weeks to recheck labs and follow up

## 2023-06-23 NOTE — Progress Notes (Unsigned)
Patient Care Team: Rodrigo Ran, MD as PCP - General (Internal Medicine) Pricilla Riffle, MD as PCP - Cardiology (Cardiology) Exie Parody, MD (Hematology and Oncology) Heilingoetter, Johnette Abraham, PA-C as Physician Assistant (Oncology) Malachy Mood, MD as Consulting Physician (Oncology) Radonna Ricker, RN (Inactive) as Oncology Nurse Navigator Andria Meuse, MD as Consulting Physician (General Surgery) Diagnostic Radiology & Imaging, [facility] as Radiologist (Diagnostic Radiology)  Clinic Day:  06/24/2023  Referring physician: Rodrigo Ran, MD  ASSESSMENT & PLAN:   Assessment & Plan: Squamous cell cancer, anus (HCC) -Initially diagnosed on 07/13/19, s/p resection, pathology revealed invasive moderately differentiated squamous cell carcinoma, 1.4 cm, pT1. Staging CT 09/04/19 was negative. -She had local recurrence and underwent surgical excision under the care of Dr. Cliffton Asters on 01/11/20. -PET 01/28/20 showed no node or distant metastasis. -She was only able to complete 5 out of the planned 6 weeks of the standard concurrent chemoRT with Mitomycin and 5FU due to poor tolerance and local skin irritation, N/V/D and had prolonged recovery -CT CAP on 06/05/21 showed NED. -most recent anoscopy with Dr. Cliffton Asters on 03/2022 was benign. -She is clinically doing well. Labs reviewed, overall stable. Physical exam was unremarkable. There is no high clinical concern for recurrence. -Continue surveillance.  -03/31/2023 CT CAP showed similar low rectal/perianal soft tissue thickening, nonspecific and likely reflecting posttreatment change.  No evidence of metastatic disease within the chest, abdomen, or pelvis.  There were stable benign tiny pulmonary nodules. -06/24/2023 - labs indicate mild anemia with moderate iron deficiency. Will have her set up for IV iron treatments. Insurance prefers Venofer. Will get 5 treatments of 200 mg spaced 1 week apart. Treatments to be administered at [facility]. Will see her back in approximately 8 weeks to recheck labs and follow up    Plan: Labs reviewed  -CBC showing WBC 4.5; Hgb 11.6; Hct 36.4; Plt 216; Anc 3.0 -CMP - K 3.8; glucose 229; BUN 23; Creatinine 0.99; eGFR > 60; Ca 10.0; LFTs normal.  -Ferritin 12; iron 59; TIBC 461; sat ratio 13; UIBC 402 -likely to benefit from IV iron at this time. Will send orders to with [address] [facility] to have this set up for her. Labs and follow-up approximately 3 to 4 weeks following final IV iron infusion.  The patient understands the plans discussed today and is in agreement with them.  She knows to contact our office if she develops concerns prior to her next appointment.  I provided 25 minutes of face-to-face time during this encounter and > 50% was spent counseling as documented under my assessment and plan.    Carlean Jews, NP  [facility] - A DEPT OF [facility] [address] [place] [address] Dept: [phone] Dept Fax: [phone]   No orders of the defined types were placed in this encounter.     CHIEF COMPLAINT:  CC: Anal cancer  Current Treatment: Surveillance  INTERVAL HISTORY:  Melinda [name] is here today for repeat clinical assessment.  She was last seen by Dr. Mosetta Putt on 12/13/2022. CT CAP was done 03/31/2023.  It is shown similar low rectal/perianal soft tissue thickening, nonspecific and likely reflecting posttreatment change.  No evidence of metastatic disease within the chest, abdomen, or pelvis.  There were stable benign tiny pulmonary nodules.  Patient is feeling very fatigued.  Having episodes of orthostasis.  No syncopal episodes reported.  Has been iron deficient  in the past.  Benefited from IV iron.  She admits to increased stress while taking care of her husband who lives currently being treated for cancer, doing radiation treatments.  She denies chest pain, chest pressure, or shortness of  breath. She denies headaches or visual disturbances. she denies abdominal pain, nausea, vomiting, or changes in bowel or bladder habits.   She denies fevers or chills. She denies pain. Her appetite is good. Her weight has increased 2 pounds over last 6 months .  I have reviewed the past medical history, past surgical history, social history and family history with the patient and they are unchanged from previous note.  ALLERGIES:  is allergic to cephalexin, amoxapine and related, amoxicillin-pot clavulanate, atorvastatin, cefaclor, ciprofloxacin, codeine, doxycycline calcium, fenofibrate micronized, fish oil, flagyl [metronidazole hcl], levofloxacin in d5w, metronidazole, ofloxacin, prednisolone, promethazine hcl, tape, tetracycline hcl, erythromycin, and nitrofurantoin.  MEDICATIONS:  Current Outpatient Medications  Medication Sig Dispense Refill   clorazepate (TRANXENE) 7.5 MG tablet Take 7.5 mg by mouth 2 (two) times daily.  1   docusate sodium (COLACE) 100 MG capsule Take 1 capsule (100 mg total) by mouth 2 (two) times daily. 30 capsule 0   esomeprazole (NEXIUM) 40 MG capsule Take 40 mg by mouth daily at 12 noon.     famotidine (PEPCID) 20 MG tablet Take 1 tablet (20 mg total) by mouth every 12 (twelve) hours as needed for heartburn or indigestion. 120 tablet 3   FLUoxetine (PROZAC) 20 MG capsule Take 20 mg by mouth every morning.     meclizine (ANTIVERT) 25 MG tablet Take 25 mg by mouth 2 (two) times daily as needed for dizziness.     metFORMIN (GLUCOPHAGE-XR) 500 MG 24 hr tablet Take 500 mg by mouth 2 (two) times daily with a meal.     methenamine (HIPREX) 1 g tablet Take 1 g by mouth daily.      polyethylene glycol powder (GLYCOLAX/MIRALAX) 17 GM/SCOOP powder Take by mouth as needed.     propranolol (INDERAL) 10 MG tablet Take 1 tablet (10 mg total) by mouth daily. 90 tablet 1   rosuvastatin (CRESTOR) 20 MG tablet Take 20 mg by mouth daily.     No current facility-administered medications  for this visit.    HISTORY OF PRESENT ILLNESS:   Oncology History Overview Note   Cancer Staging  Squamous cell cancer, anus (HCC) Staging form: Anus, AJCC 8th Edition - Clinical stage from 01/11/2020: Stage I (cT1, cN0, cM0) - Signed by Malachy Mood, MD on 06/14/2022 Stage prefix: Initial diagnosis Histologic grade (G): G2 Histologic grading system: 4 grade system     Squamous cell cancer, anus (HCC)  07/13/2019 Procedure    EUA, Excision of posterior internal/external hemorrhoid under the care of Dr. Daphine Deutscher    07/13/2019 Pathology Results   - Invasive moderately differentiated squamous cell carcinoma, 1.4 cm.  See comment  - Carcinoma invades for depth of 0.4 cm  - Deep resection margin is negative for carcinoma (0.2 cm)  - Lateral mucosal margin is positive for high-grade dysplasia  - No evidence of lymphovascular perineural invasion  Procedure: Local excision  Tumor Site: Anal canal  Tumor Size: 1.4 cm  Histologic Type: Invasive squamous cell carcinoma  Histologic Grade: G2: Moderately differentiated  Tumor Extension: Carcinoma invades superficial anal sphincter muscle  Margins: Uninvolved by tumor  Treatment Effect: N/A  Regional Lymph Nodes: No lymph nodes submitted or found  Pathologic Stage Classification (pTNM, AJCC 8th Edition):  pT1, pNx  Representative  Tumor Block: A1  Comment(s): Lateral mucosal margin is positive for high-grade squamous  dysplasia      07/13/2019 Initial Diagnosis   Squamous cell cancer, anus (HCC)   09/04/2019 Imaging   CT scan Chest, Abdomen, and Pelvis   IMPRESSION: 1. No evidence of metastatic disease in the chest, abdomen or pelvis. 2. No discrete anorectal mass. 3. Chronic findings include: Punctate nonobstructing upper right renal stones and chronic right renal scarring. 4. Aortic Atherosclerosis (ICD10-I70.0).   01/10/2020 Pathology Results   A. ANAL LESION, POSTERIOR MIDLINE, EXCISION:  - Squamous cell carcinoma, moderately  differentiated.  Verbally reported by Dr. Luisa Hart 1.5 cm, negative margins, depth <1 mm   01/11/2020 Procedure   1. Excision of anal canal lesion (posterior midline) under the care of Dr. Cliffton Asters     01/11/2020 Cancer Staging   Staging form: Anus, AJCC 8th Edition - Clinical stage from 01/11/2020: Stage I (cT1, cN0, cM0) - Signed by Malachy Mood, MD on 06/14/2022 Stage prefix: Initial diagnosis Histologic grade (G): G2 Histologic grading system: 4 grade system   01/28/2020 PET scan   IMPRESSION: 1. Mild focal anal hypermetabolism without discrete mass correlate on the CT images, nonspecific, differential includes postsurgical change or residual anal tumor. 2. No hypermetabolic locoregional or distant metastatic disease. 3. Chronic findings include: Aortic Atherosclerosis (ICD10-I70.0). Diffuse hepatic steatosis. Nonobstructing right nephrolithiasis.     02/04/2020 - 03/07/2020 Radiation Therapy   concurrent chemoRT by Dr Mitzi Hansen with Mitomycin and 5FU starting 02/04/20-03/07/20. The last 4 session cancelled due to poor toleration.    02/04/2020 - 03/03/2020 Chemotherapy   Concurrent chemoRT with Mitomycin and 5FU on week 1 and 5 starting 02/04/20-03/03/20   03/13/2020 Imaging   CT AP W contrast  IMPRESSION: Diffuse wall thickening with inflammatory changes involving a long segment of distal ileum, cecum, and sigmoid colon likely represents enteritis/colitis. This could be due to infectious, or inflammatory nature, not thought to represent ischemic colitis.   Hepatic steatosis   Trace pericardial effusion   Small free fluid in the deep pelvis   06/04/2020 Imaging   PET IMPRESSION: 1. Diminished metabolic uptake about the rectum with presumed post radiation changes showing mild perirectal stranding and perianal stranding on today's exam. Continued correlation with direct visualization may be helpful to exclude any residual tumor in this location but diminished FDG uptake is encouraging and  findings could certainly be seen in the setting of post treatment change. 2. No signs of metastatic disease to the neck, chest, abdomen or pelvis. 3. Diminished perienteric stranding about distal small bowel with persistent mild stranding and stool like material in distal small bowel likely related to enteritis and associated with mild but diminished FDG uptake. Attention on follow-up. 4. Resolution of RIGHT-sided hydronephrosis with signs of nephrolithiasis as before. 5. Mild asymmetry of metabolic activity along the RIGHT chest wall associated serrated S musculature adjacent to the tip of the scapula felt to represent asymmetric muscular activity. Correlate with any symptoms in this area and suggest attention on follow-up.   Aortic Atherosclerosis (ICD10-I70.0).   06/05/2021 Imaging   EXAM: CT CHEST, ABDOMEN, AND PELVIS WITH CONTRAST  IMPRESSION: 1. No new or progressive findings to suggest metastatic disease in the chest, abdomen, or pelvis. 2. The wall thickening and perienteric edema associated with the terminal ileum on prior studies has resolved completely. Presacral and perirectal edema/stranding seen previously has also resolved. 3. Tiny bilateral pulmonary nodules, stable since prior PET-CT, consistent with benign etiology. 4. Right renal cortical scarring with tiny  nonobstructing stones. 5. Aortic Atherosclerosis (ICD10-I70.0).   03/31/2023 Imaging   CT chest abdomen pelvis with contrast IMPRESSION: 1. Similar low rectal/perianal soft tissue thickening, nonspecific and possibly reflecting posttreatment change. Suggest correlation with direct visualization. 2. No evidence of metastatic disease within the chest, abdomen or pelvis. 3. Stable benign tiny pulmonary nodules. 4.  Aortic Atherosclerosis (ICD10-I70.0).       REVIEW OF SYSTEMS:   Constitutional: Denies fevers, chills or abnormal weight los. Reports moderate fatigue Eyes: Denies blurriness of  vision Ears, nose, mouth, throat, and face: Denies mucositis or sore throat Respiratory: Denies cough, dyspnea or wheezes Cardiovascular: Denies palpitation, chest discomfort or lower extremity swelling Gastrointestinal:  Denies nausea, heartburn or change in bowel habits Skin: Denies abnormal skin rashes Lymphatics: Denies new lymphadenopathy or easy bruising Neurological:Denies numbness, tingling or new weaknesses. Has had some dizziness and episodes of orthostasis.  Behavioral/Psych: Mood is stable, no new changes  All other systems were reviewed with the patient and are negative.   VITALS:   Today's Vitals   06/24/23 1013  BP: 138/68  Pulse: 84  Resp: 14  Temp: 98.8 F (37.1 C)  TempSrc: Temporal  SpO2: 97%  Weight: 161 lb 1.6 oz (73.1 kg)   Body mass index is 26 kg/m.    Wt Readings from Last 3 Encounters:  06/24/23 161 lb 1.6 oz (73.1 kg)  05/18/23 155 lb (70.3 kg)  01/04/23 159 lb (72.1 kg)    Body mass index is 26 kg/m.  Performance status (ECOG): 1 - Symptomatic but completely ambulatory  PHYSICAL EXAM:   GENERAL:alert, no distress and comfortable SKIN: skin color, texture, turgor are normal, no rashes or significant lesions EYES: normal, Conjunctiva are pink and non-injected, sclera clear OROPHARYNX:no exudate, no erythema and lips, buccal mucosa, and tongue normal  NECK: supple, thyroid normal size, non-tender, without nodularity LYMPH:  no palpable lymphadenopathy in the cervical, axillary or inguinal LUNGS: clear to auscultation and percussion with normal breathing effort HEART: regular rate & rhythm and no murmurs and no lower extremity edema ABDOMEN:abdomen soft, non-tender and normal bowel sounds Musculoskeletal:no cyanosis of digits and no clubbing  NEURO: alert & oriented x 3 with fluent speech, no focal motor/sensory deficits  LABORATORY DATA:  I have reviewed the data as listed    Component Value Date/Time   NA 139 06/20/2023 0848   K 3.8  06/20/2023 0848   CL 104 06/20/2023 0848   CO2 27 06/20/2023 0848   GLUCOSE 229 (H) 06/20/2023 0848   BUN 23 06/20/2023 0848   CREATININE 0.99 06/20/2023 0848   CALCIUM 10.0 06/20/2023 0848   PROT 7.2 06/20/2023 0848   ALBUMIN 4.4 06/20/2023 0848   AST 28 06/20/2023 0848   ALT 30 06/20/2023 0848   ALKPHOS 72 06/20/2023 0848   BILITOT 0.4 06/20/2023 0848   GFRNONAA >60 06/20/2023 0848   GFRAA >60 03/10/2020 1253    Lab Results  Component Value Date   WBC 4.5 06/20/2023   NEUTROABS 3.0 06/20/2023   HGB 11.6 (L) 06/20/2023   HCT 36.4 06/20/2023   MCV 81.3 06/20/2023   PLT 216 06/20/2023   Iron/TIBC/Ferritin/ %Sat    Component Value Date/Time   IRON 59 06/20/2023 0848   TIBC 461 (H) 06/20/2023 0848   FERRITIN 12 06/20/2023 0849   IRONPCTSAT 13 06/20/2023 0848   IRONPCTSAT 9 (L) 09/25/2012 1408

## 2023-06-24 ENCOUNTER — Inpatient Hospital Stay (HOSPITAL_BASED_OUTPATIENT_CLINIC_OR_DEPARTMENT_OTHER): Payer: BC Managed Care – PPO | Admitting: Nurse Practitioner

## 2023-06-24 VITALS — BP 138/68 | HR 84 | Temp 98.8°F | Resp 14 | Wt 161.1 lb

## 2023-06-24 DIAGNOSIS — D649 Anemia, unspecified: Secondary | ICD-10-CM

## 2023-06-24 DIAGNOSIS — R918 Other nonspecific abnormal finding of lung field: Secondary | ICD-10-CM | POA: Diagnosis not present

## 2023-06-24 DIAGNOSIS — C210 Malignant neoplasm of anus, unspecified: Secondary | ICD-10-CM

## 2023-06-24 DIAGNOSIS — D509 Iron deficiency anemia, unspecified: Secondary | ICD-10-CM | POA: Diagnosis not present

## 2023-06-24 DIAGNOSIS — Z85048 Personal history of other malignant neoplasm of rectum, rectosigmoid junction, and anus: Secondary | ICD-10-CM | POA: Diagnosis not present

## 2023-06-24 DIAGNOSIS — R5383 Other fatigue: Secondary | ICD-10-CM | POA: Diagnosis not present

## 2023-06-28 ENCOUNTER — Telehealth: Payer: Self-pay | Admitting: Pharmacy Technician

## 2023-06-28 ENCOUNTER — Encounter: Payer: Self-pay | Admitting: Nurse Practitioner

## 2023-06-28 ENCOUNTER — Other Ambulatory Visit: Payer: Self-pay

## 2023-06-28 DIAGNOSIS — D500 Iron deficiency anemia secondary to blood loss (chronic): Secondary | ICD-10-CM | POA: Insufficient documentation

## 2023-06-28 NOTE — Telephone Encounter (Signed)
Vista Lawman note:  Patient will be scheduled as soon as possible.  Auth Submission: NO AUTH NEEDED Site of care: Site of care: [facility] Payer: [employer] Medication & CPT/J Code(s) submitted: Venofer (Iron Sucrose) J1756 Route of submission (phone, fax, portal):  Phone # Fax # Auth type: Buy/Bill PB Units/visits requested: 5 DOSES Reference number:  Approval from: 06/28/23 to 11/26/23

## 2023-06-29 ENCOUNTER — Other Ambulatory Visit: Payer: Self-pay | Admitting: Nurse Practitioner

## 2023-06-29 ENCOUNTER — Other Ambulatory Visit: Payer: Self-pay

## 2023-06-29 DIAGNOSIS — R300 Dysuria: Secondary | ICD-10-CM | POA: Diagnosis not present

## 2023-06-29 DIAGNOSIS — N3020 Other chronic cystitis without hematuria: Secondary | ICD-10-CM | POA: Diagnosis not present

## 2023-06-29 NOTE — Telephone Encounter (Signed)
Heather,  Patient will be scheduled as soon as possible.  Feraheme - no auth needed Ref: [id number] 06/29/23 - 11/27/23

## 2023-06-29 NOTE — Telephone Encounter (Signed)
Hi. Thank you. The patient requested that I change the order to fereheme X 2 doses. I have changed the treatment plan and orders and just sent new orders. Sorry for the confusion. Herbert Seta, NP

## 2023-06-30 ENCOUNTER — Other Ambulatory Visit: Payer: Self-pay

## 2023-07-06 ENCOUNTER — Ambulatory Visit: Payer: BC Managed Care – PPO

## 2023-07-06 VITALS — BP 120/81 | HR 69 | Temp 98.1°F | Resp 14 | Ht 66.0 in | Wt 160.4 lb

## 2023-07-06 DIAGNOSIS — D509 Iron deficiency anemia, unspecified: Secondary | ICD-10-CM | POA: Diagnosis not present

## 2023-07-06 DIAGNOSIS — D500 Iron deficiency anemia secondary to blood loss (chronic): Secondary | ICD-10-CM

## 2023-07-06 MED ORDER — DIPHENHYDRAMINE HCL 25 MG PO CAPS: 25.0000 mg | ORAL_CAPSULE | Freq: Once | ORAL | Status: DC

## 2023-07-06 MED ORDER — SODIUM CHLORIDE 0.9 % IV SOLN
510.0000 mg | Freq: Once | INTRAVENOUS | Status: AC
  Administered 2023-07-06: 510 mg via INTRAVENOUS
  Filled 2023-07-06: qty 17

## 2023-07-06 MED ORDER — ACETAMINOPHEN 325 MG PO TABS: 650.0000 mg | ORAL_TABLET | Freq: Once | ORAL | Status: DC

## 2023-07-06 NOTE — Progress Notes (Signed)
Diagnosis: Iron Deficiency Anemia  Provider:  Chilton Greathouse MD  Procedure: IV Infusion  IV Type: Peripheral, IV Location: L Antecubital  Feraheme (Ferumoxytol), Dose: 510 mg  Infusion Start Time: 1146  Infusion Stop Time: 1205  Patient refused pre-medications. Nurse educated patient and stressed the importance of taking pre-medications as a precaution in the event of a medication reaction. Patient verbalized understanding.   Post Infusion IV Care: Patient declined observation and Peripheral IV Discontinued  Discharge: Condition: Stable, Destination: Home . AVS Declined  Performed by:  Wyvonne Lenz, RN

## 2023-07-13 ENCOUNTER — Ambulatory Visit: Payer: BC Managed Care – PPO

## 2023-07-13 VITALS — BP 128/81 | HR 77 | Temp 98.1°F | Resp 16 | Ht 66.0 in | Wt 162.4 lb

## 2023-07-13 DIAGNOSIS — D500 Iron deficiency anemia secondary to blood loss (chronic): Secondary | ICD-10-CM

## 2023-07-13 DIAGNOSIS — D509 Iron deficiency anemia, unspecified: Secondary | ICD-10-CM

## 2023-07-13 MED ORDER — DIPHENHYDRAMINE HCL 25 MG PO CAPS
25.0000 mg | ORAL_CAPSULE | Freq: Once | ORAL | Status: DC
Start: 2023-07-13 — End: 2023-07-13

## 2023-07-13 MED ORDER — SODIUM CHLORIDE 0.9 % IV SOLN
510.0000 mg | Freq: Once | INTRAVENOUS | Status: AC
  Administered 2023-07-13: 510 mg via INTRAVENOUS
  Filled 2023-07-13: qty 17

## 2023-07-13 MED ORDER — ACETAMINOPHEN 325 MG PO TABS
650.0000 mg | ORAL_TABLET | Freq: Once | ORAL | Status: DC
Start: 2023-07-13 — End: 2023-07-13

## 2023-07-13 NOTE — Progress Notes (Signed)
 Diagnosis: Iron  Deficiency Anemia  Provider:  Mannam, Praveen MD  Procedure: IV Infusion  IV Type: Peripheral, IV Location: L Antecubital  Patient refused pre-medications. Nurse educated patient and stressed the importance of taking pre-medications as a precaution in the event of a medication reaction. Patient verbalized understanding.   Feraheme  (Ferumoxytol ), Dose: 510 mg  Infusion Start Time: 1139  Infusion Stop Time: 1156  Post Infusion IV Care: Patient declined observation and Peripheral IV Discontinued  Discharge: Condition: Good, Destination: Home . AVS Declined  Performed by:  Maximiano JONELLE Pouch, LPN

## 2023-07-14 DIAGNOSIS — E119 Type 2 diabetes mellitus without complications: Secondary | ICD-10-CM | POA: Diagnosis not present

## 2023-08-05 DIAGNOSIS — R300 Dysuria: Secondary | ICD-10-CM | POA: Diagnosis not present

## 2023-08-05 DIAGNOSIS — N3020 Other chronic cystitis without hematuria: Secondary | ICD-10-CM | POA: Diagnosis not present

## 2023-09-07 DIAGNOSIS — E119 Type 2 diabetes mellitus without complications: Secondary | ICD-10-CM | POA: Diagnosis not present

## 2023-11-15 DIAGNOSIS — F3341 Major depressive disorder, recurrent, in partial remission: Secondary | ICD-10-CM | POA: Diagnosis not present

## 2023-11-15 DIAGNOSIS — F411 Generalized anxiety disorder: Secondary | ICD-10-CM | POA: Diagnosis not present

## 2024-01-10 ENCOUNTER — Other Ambulatory Visit: Payer: Self-pay | Admitting: Internal Medicine

## 2024-02-16 ENCOUNTER — Other Ambulatory Visit: Payer: Self-pay | Admitting: Internal Medicine

## 2024-03-07 ENCOUNTER — Encounter: Payer: Self-pay | Admitting: Surgery

## 2024-03-09 ENCOUNTER — Other Ambulatory Visit (HOSPITAL_COMMUNITY): Payer: Self-pay | Admitting: Internal Medicine

## 2024-03-13 ENCOUNTER — Telehealth (HOSPITAL_COMMUNITY): Payer: Self-pay | Admitting: Pharmacy Technician

## 2024-03-20 ENCOUNTER — Encounter (HOSPITAL_COMMUNITY): Payer: Self-pay | Admitting: Internal Medicine

## 2024-03-20 NOTE — Telephone Encounter (Addendum)
 NOTE: Referral for Feraheme  received. Pt's insurance prefers Venofer . Left vmail for Katheryn, Dr Perini's nurse, to request updated referral for Venofer . 03/20/24   Auth Submission: NO AUTH NEEDED Site of care: [facility] Payer: [employer] MEDICARE Medication & CPT/J Code(s) submitted: Venofer  (Iron  Sucrose) J1756 Diagnosis Code: D50.9, D50.0 Route of submission (phone, fax, portal):  Phone # Fax # Auth type: Buy/Bill PB Units/visits requested: PENDING MD ORDER Reference number:  Approval from: 03/20/2024 to 06/06/24     Dagoberto Armour, CPhT [facility] Phone: [phone] 03/20/2024

## 2024-03-26 ENCOUNTER — Telehealth (HOSPITAL_COMMUNITY): Payer: Self-pay | Admitting: Internal Medicine

## 2024-03-26 ENCOUNTER — Other Ambulatory Visit (HOSPITAL_COMMUNITY): Payer: Self-pay | Admitting: Internal Medicine

## 2024-03-26 DIAGNOSIS — D649 Anemia, unspecified: Secondary | ICD-10-CM | POA: Insufficient documentation

## 2024-03-26 NOTE — Telephone Encounter (Signed)
 Patient referred to infusion pharmacy team for ambulatory infusion of IV iron .  Insurance - [employer] copywriter of care - Site of care: [facility] Dx code - D64.9 IV Iron  Therapy - Per discussion with provider, will authorize Venofer  200 mg x 5  Infusion appointments - Scheduling team will schedule patient as soon as possible.   Thank you,  Norton Blush, PharmD, BCSCP [facility]

## 2024-04-02 ENCOUNTER — Ambulatory Visit (INDEPENDENT_AMBULATORY_CARE_PROVIDER_SITE_OTHER)

## 2024-04-02 VITALS — BP 111/71 | HR 76 | Temp 97.8°F | Resp 16 | Ht 66.0 in | Wt 124.8 lb

## 2024-04-02 DIAGNOSIS — D509 Iron deficiency anemia, unspecified: Secondary | ICD-10-CM | POA: Diagnosis not present

## 2024-04-02 DIAGNOSIS — D649 Anemia, unspecified: Secondary | ICD-10-CM | POA: Diagnosis not present

## 2024-04-02 MED ORDER — IRON SUCROSE 20 MG/ML IV SOLN
200.0000 mg | Freq: Once | INTRAVENOUS | Status: AC
  Administered 2024-04-02: 200 mg via INTRAVENOUS
  Filled 2024-04-02: qty 10

## 2024-04-02 NOTE — Progress Notes (Signed)
 Diagnosis: Iron  Deficiency Anemia  Provider:  Praveen Mannam MD  Procedure: IV Push  IV Type: Peripheral, IV Location: L Antecubital  Venofer  (Iron  Sucrose), Dose: 200 mg  Post Infusion IV Care: Observation period completed and Peripheral IV Discontinued  Discharge: Condition: Good, Destination: Home . AVS Declined  Performed by:  Maximiano JONELLE Pouch, LPN

## 2024-04-04 ENCOUNTER — Ambulatory Visit (INDEPENDENT_AMBULATORY_CARE_PROVIDER_SITE_OTHER)

## 2024-04-04 VITALS — BP 97/62 | HR 71 | Temp 98.0°F | Resp 16 | Ht 66.0 in | Wt 127.4 lb

## 2024-04-04 DIAGNOSIS — D509 Iron deficiency anemia, unspecified: Secondary | ICD-10-CM

## 2024-04-04 DIAGNOSIS — D649 Anemia, unspecified: Secondary | ICD-10-CM

## 2024-04-04 MED ORDER — IRON SUCROSE 20 MG/ML IV SOLN
200.0000 mg | Freq: Once | INTRAVENOUS | Status: AC
  Administered 2024-04-04: 200 mg via INTRAVENOUS
  Filled 2024-04-04: qty 10

## 2024-04-04 NOTE — Progress Notes (Signed)
 Diagnosis: Iron  Deficiency Anemia  Provider:  Praveen Mannam MD  Procedure: IV Push  IV Type: Peripheral, IV Location: L Antecubital  Venofer  (Iron  Sucrose), Dose: 200 mg  Post Infusion IV Care: Observation period completed and Peripheral IV Discontinued  Discharge: Condition: Good, Destination: Home . AVS Declined  Performed by:  Maximiano JONELLE Pouch, LPN

## 2024-04-06 ENCOUNTER — Ambulatory Visit

## 2024-04-06 VITALS — BP 115/74 | HR 75 | Temp 98.1°F | Resp 20 | Ht 66.0 in | Wt 127.8 lb

## 2024-04-06 DIAGNOSIS — D509 Iron deficiency anemia, unspecified: Secondary | ICD-10-CM

## 2024-04-06 DIAGNOSIS — D649 Anemia, unspecified: Secondary | ICD-10-CM

## 2024-04-06 MED ORDER — IRON SUCROSE 20 MG/ML IV SOLN
200.0000 mg | Freq: Once | INTRAVENOUS | Status: AC
  Administered 2024-04-06: 200 mg via INTRAVENOUS
  Filled 2024-04-06: qty 10

## 2024-04-06 MED ORDER — SODIUM CHLORIDE 0.9 % IV BOLUS
250.0000 mL | Freq: Once | INTRAVENOUS | Status: AC
  Administered 2024-04-06: 250 mL via INTRAVENOUS
  Filled 2024-04-06: qty 250

## 2024-04-06 NOTE — Progress Notes (Signed)
 Diagnosis: Iron  Deficiency Anemia  Provider:  Praveen Mannam MD  Procedure: IV Push  IV Type: Peripheral, IV Location: L Antecubital  Venofer  (Iron  Sucrose), Dose: 200 mg  Post Infusion IV Care: Observation period completed and Peripheral IV Discontinued  Discharge: Condition: Good, Destination: Home . AVS Declined  Performed by:  Maximiano JONELLE Pouch, LPN

## 2024-04-09 ENCOUNTER — Encounter (HOSPITAL_COMMUNITY): Payer: Self-pay | Admitting: Internal Medicine

## 2024-04-09 ENCOUNTER — Ambulatory Visit (INDEPENDENT_AMBULATORY_CARE_PROVIDER_SITE_OTHER)

## 2024-04-09 VITALS — BP 119/77 | HR 71 | Temp 97.8°F | Resp 14 | Ht 66.0 in | Wt 126.6 lb

## 2024-04-09 DIAGNOSIS — D509 Iron deficiency anemia, unspecified: Secondary | ICD-10-CM

## 2024-04-09 DIAGNOSIS — D649 Anemia, unspecified: Secondary | ICD-10-CM

## 2024-04-09 MED ORDER — SODIUM CHLORIDE 0.9 % IV BOLUS
250.0000 mL | Freq: Once | INTRAVENOUS | Status: AC
  Administered 2024-04-09: 250 mL via INTRAVENOUS
  Filled 2024-04-09: qty 250

## 2024-04-09 MED ORDER — IRON SUCROSE 20 MG/ML IV SOLN
200.0000 mg | Freq: Once | INTRAVENOUS | Status: AC
  Administered 2024-04-09: 200 mg via INTRAVENOUS
  Filled 2024-04-09: qty 10

## 2024-04-09 NOTE — Progress Notes (Signed)
 Diagnosis: Iron  Deficiency Anemia  Provider:  Praveen Mannam MD  Procedure: IV Push  IV Type: Peripheral, IV Location: L Antecubital  Venofer  (Iron  Sucrose), Dose: 200 mg  Post Infusion IV Care: Observation period completed and Peripheral IV Discontinued  Discharge: Condition: Good, Destination: Home . AVS Declined  Performed by:  Maximiano JONELLE Pouch, LPN

## 2024-04-11 ENCOUNTER — Ambulatory Visit

## 2024-04-11 VITALS — BP 123/77 | HR 70 | Temp 97.8°F | Resp 20 | Ht 66.0 in | Wt 130.4 lb

## 2024-04-11 DIAGNOSIS — D649 Anemia, unspecified: Secondary | ICD-10-CM | POA: Diagnosis not present

## 2024-04-11 DIAGNOSIS — D509 Iron deficiency anemia, unspecified: Secondary | ICD-10-CM

## 2024-04-11 MED ORDER — IRON SUCROSE 20 MG/ML IV SOLN
200.0000 mg | Freq: Once | INTRAVENOUS | Status: AC
  Administered 2024-04-11: 200 mg via INTRAVENOUS
  Filled 2024-04-11: qty 10

## 2024-04-11 MED ORDER — SODIUM CHLORIDE 0.9 % IV BOLUS
250.0000 mL | Freq: Once | INTRAVENOUS | Status: AC
  Administered 2024-04-11: 250 mL via INTRAVENOUS
  Filled 2024-04-11: qty 250

## 2024-04-11 NOTE — Progress Notes (Signed)
 Diagnosis: Iron  Deficiency Anemia  Provider:  Praveen Mannam MD  Procedure: IV Push  IV Type: Peripheral, IV Location: L Antecubital  Venofer  (Iron  Sucrose), Dose: 200 mg  Post Infusion IV Care: Observation period completed and Peripheral IV Discontinued  Discharge: Condition: Good, Destination: Home . AVS Declined  Performed by:  Maximiano JONELLE Pouch, LPN

## 2024-04-13 ENCOUNTER — Other Ambulatory Visit: Payer: Self-pay | Admitting: Internal Medicine

## 2024-05-01 ENCOUNTER — Other Ambulatory Visit (INDEPENDENT_AMBULATORY_CARE_PROVIDER_SITE_OTHER)

## 2024-05-01 ENCOUNTER — Ambulatory Visit (INDEPENDENT_AMBULATORY_CARE_PROVIDER_SITE_OTHER): Admitting: Nurse Practitioner

## 2024-05-01 ENCOUNTER — Ambulatory Visit: Payer: Self-pay | Admitting: Nurse Practitioner

## 2024-05-01 ENCOUNTER — Encounter: Payer: Self-pay | Admitting: Nurse Practitioner

## 2024-05-01 VITALS — BP 106/70 | HR 84 | Ht 66.0 in | Wt 129.0 lb

## 2024-05-01 DIAGNOSIS — K625 Hemorrhage of anus and rectum: Secondary | ICD-10-CM

## 2024-05-01 DIAGNOSIS — Z85048 Personal history of other malignant neoplasm of rectum, rectosigmoid junction, and anus: Secondary | ICD-10-CM | POA: Diagnosis not present

## 2024-05-01 LAB — CBC
HCT: 35.1 % — ABNORMAL LOW (ref 36.0–46.0)
Hemoglobin: 11.4 g/dL — ABNORMAL LOW (ref 12.0–15.0)
MCHC: 32.5 g/dL (ref 30.0–36.0)
MCV: 81.7 fl (ref 78.0–100.0)
Platelets: 188 K/uL (ref 150.0–400.0)
RBC: 4.3 Mil/uL (ref 3.87–5.11)
RDW: 16.7 % — ABNORMAL HIGH (ref 11.5–15.5)
WBC: 2.9 K/uL — ABNORMAL LOW (ref 4.0–10.5)

## 2024-05-01 LAB — IBC + FERRITIN
Ferritin: 119 ng/mL (ref 10.0–291.0)
Iron: 65 ug/dL (ref 42–145)
Saturation Ratios: 18.7 % — ABNORMAL LOW (ref 20.0–50.0)
TIBC: 347.2 ug/dL (ref 250.0–450.0)
Transferrin: 248 mg/dL (ref 212.0–360.0)

## 2024-05-01 MED ORDER — NA SULFATE-K SULFATE-MG SULF 17.5-3.13-1.6 GM/177ML PO SOLN: 1.0000 | Freq: Once | ORAL | 0 refills | Status: AC

## 2024-05-01 NOTE — Patient Instructions (Addendum)
 Your provider has requested that you go to the basement level for lab work before leaving today. Press B on the elevator. The lab is located at the first door on the left as you exit the elevator.  Take Miralax  nightly - contact Colleen about 1-2 weeks from colonoscopy date with upate.  You have been scheduled for a colonoscopy. Please follow written instructions given to you at your visit today.   If you use inhalers (even only as needed), please bring them with you on the day of your procedure.  DO NOT TAKE 7 DAYS PRIOR TO TEST- Trulicity (dulaglutide) Ozempic, Wegovy (semaglutide) Mounjaro, Zepbound (tirzepatide) Bydureon Bcise (exanatide extended release)  DO NOT TAKE 1 DAY PRIOR TO YOUR TEST Rybelsus (semaglutide) Adlyxin (lixisenatide) Victoza (liraglutide) Byetta (exanatide) ___________________________________________________________________________

## 2024-05-01 NOTE — Progress Notes (Signed)
 05/01/2024 Melinda [name] [id number] [birth year]   Chief Complaint: Schedule a colonoscopy, rectal bleeding  History of Present Illness: Melinda [name] is a 63 year old female with a past medical history of deficiency anemia and anal cancer diagnosed in 2021 after undergoing a hemorrhoidectomy and incidental finding of anal cancer.  She underwent chemotherapy and radiation without evidence of recurrent disease.  She is followed by Dr. Lanny and Dr. Teresa.  She was initially seen in our office by Dr. Stacia 05/21/2022 as referred by her PCP Dr. Shayne to schedule colonoscopy.  Her most recent colonoscopy by [facility] was completed in 2020 and 8 polyps were removed (2 cecal, 5 transverse and 1 descending) and the bowel prep was listed as fair quality) the repeat colonoscopy in 1 year was recommended but was not done.  Dr. Stacia recommended scheduling a colonoscopy after reviewing this colonoscopy procedure and path report.  However, the patient stated she did not schedule a colonoscopy at that time because her husband was undergoing evaluation and treatment for cancer.  She presents today to schedule colonoscopy.  She endorses having chronic constipation.  She passes a bowel movement once weekly.  She passes hard stools, sometimes has to disimpact herself.  She sees a small moderate amount of bright red blood on the stool or toilet tissue which comes and goes.  She denies having any abdominal or anorectal pain.  She has iron  deficiency anemia and stated she recently received IV iron  x 2 doses, last infusion was 2 weeks ago.  GERD symptoms are well-controlled on Esomeprazole  40 mg daily and Famotidine  20 mg as needed.  She is on Mounjaro.   GI PROCEDURES:  Hemorrhoid banding x 3 in 2020 (Dr. Luis)    Colonoscopy 2020 Dr. Saintclair [facility]): We received a colonoscopy report from [facility] from 2020 in which she had 8 polyps removed (2 cecal, 5 transverse and 1 descending) and the  bowel prep was listed as fair quality. She was recommended to repeat colonoscopy in 1 year. The pathology reports from the resected polyps were not sent.   Colonoscopy  April 2017, Dr. Celestia [facility])  Indication: Heme positive stool, iron  deficiency anemia, family history of polyps  Findings: Many rectal hyperplastic polyps (not removed), internal hemorrhoids, repeat colonoscopy 5 years   Colonoscopy  February 2014, Dr. Celestia [facility])  Indication: Heme positive stool, iron  deficiency anemia, family history of polyps  Findings: Few rectal polyps, biopsied with forceps, pathology consistent with hyperplastic polyps   EGD  February 2014, Dr. Celestia [facility])  Endoscopy report not available for review  Histology report indicates duodenal biopsies were obtained which were normal   Esophageal manometry 2018  Normal  Unclear indication   Discussed the use of AI scribe software for clinical note transcription with the patient, who gave verbal consent to proceed.  Addendum:  We received a colonoscopy report from [facility] from 2020 in which she had 8 polyps removed (2 cecal, 5 transverse and 1 descending) and the bowel prep was listed as fair quality.  She was recommended to repeat colonoscopy in 1 year.  The pathology reports from the resected polyps were not sent.  Based on this information, I would recommend we repeat a colonoscopy now.     Latest Ref Rng & Units 06/20/2023    8:48 AM 03/29/2023    1:32 PM 12/13/2022   12:45 PM  CBC  WBC 4.0 - 10.5 K/uL 4.5  4.2  4.2   Hemoglobin 12.0 - 15.0 g/dL 88.3  87.4  88.5   Hematocrit 36.0 - 46.0 % 36.4  37.5  35.4   Platelets 150 - 400 K/uL 216  220  228        Latest Ref Rng & Units 06/20/2023    8:48 AM 03/29/2023    1:32 PM 12/13/2022   12:45 PM  CMP  Glucose 70 - 99 mg/dL 770  718  826   BUN 8 - 23 mg/dL 23  27  19    Creatinine 0.44 - 1.00 mg/dL 9.00  9.03  9.01   Sodium 135 - 145 mmol/L 139  137  137   Potassium 3.5 - 5.1  mmol/L 3.8  4.1  4.4   Chloride 98 - 111 mmol/L 104  103  104   CO2 22 - 32 mmol/L 27  27  27    Calcium  8.9 - 10.3 mg/dL 89.9  89.8  9.7   Total Protein 6.5 - 8.1 g/dL 7.2  7.1  7.0   Total Bilirubin 0.0 - 1.2 mg/dL 0.4  0.4  0.3   Alkaline Phos 38 - 126 U/L 72  51  52   AST 15 - 41 U/L 28  28  20    ALT 0 - 44 U/L 30  37  17     Current Outpatient Medications on File Prior to Visit  Medication Sig Dispense Refill   clorazepate  (TRANXENE ) 7.5 MG tablet Take 7.5 mg by mouth 2 (two) times daily.  1   docusate sodium  (COLACE) 100 MG capsule Take 1 capsule (100 mg total) by mouth 2 (two) times daily. 30 capsule 0   esomeprazole  (NEXIUM ) 40 MG capsule Take 40 mg by mouth daily at 12 noon.     famotidine  (PEPCID ) 20 MG tablet Take 1 tablet (20 mg total) by mouth every 12 (twelve) hours as needed for heartburn or indigestion. 120 tablet 3   FLUoxetine  (PROZAC ) 20 MG capsule Take 20 mg by mouth every morning.     meclizine (ANTIVERT) 25 MG tablet Take 25 mg by mouth 2 (two) times daily as needed for dizziness.     metFORMIN  (GLUCOPHAGE -XR) 500 MG 24 hr tablet Take 500 mg by mouth 2 (two) times daily with a meal.     methenamine  (HIPREX ) 1 g tablet Take 1 g by mouth daily.      MOUNJARO 2.5 MG/0.5ML Pen PLEASE SEE ATTACHED FOR DETAILED DIRECTIONS     polyethylene glycol powder (GLYCOLAX /MIRALAX ) 17 GM/SCOOP powder Take by mouth as needed.     propranolol  (INDERAL ) 10 MG tablet TAKE 1 TABLET (10 MG TOTAL) BY MOUTH DAILY. NEED OV. 30 tablet 0   rosuvastatin  (CRESTOR ) 20 MG tablet Take 20 mg by mouth daily.     No current facility-administered medications on file prior to visit.   Allergies  Allergen Reactions   Cephalexin  Anaphylaxis and Other (See Comments)    Kef tabs only (maybe dye) Tolerated Keflex  inpatient in 2016   Amoxapine And Related    Amoxicillin-Pot Clavulanate Other (See Comments)   Atorvastatin Other (See Comments)    intolernace   Cefaclor Other (See Comments)    Ciprofloxacin  Nausea And Vomiting   Codeine Hives   Doxycycline Calcium  Nausea And Vomiting   Fenofibrate Micronized Other (See Comments)    Cramping   Fish Oil Other (See Comments)   Flagyl  [Metronidazole  Hcl]    Levofloxacin In D5w Other (See Comments)    Unknown    Metronidazole  Nausea  And Vomiting   Ofloxacin Other (See Comments)   Prednisolone Other (See Comments)    Anxiety, palpitations   Promethazine Hcl Other (See Comments)   Tape Other (See Comments)    Rash and blisters, only plastic tape   Tetracycline Hcl Other (See Comments)   Erythromycin Rash   Nitrofurantoin  Itching, Nausea And Vomiting and Rash   Current Medications, Allergies, Past Medical History, Past Surgical History, Family History and Social History were reviewed in [employer] record.  Review of Systems:   Constitutional: Negative for fever, sweats, chills or weight loss.  Respiratory: Negative for shortness of breath.   Cardiovascular: Negative for chest pain, palpitations and leg swelling.  Gastrointestinal: See HPI.  Musculoskeletal: Negative for back pain or muscle aches.  Neurological: Negative for dizziness, headaches or paresthesias.   Physical Exam: BP 106/70   Pulse 84   Ht 5' 6 (1.676 m)   Wt 129 lb (58.5 kg)   BMI 20.82 kg/m  Wt Readings from Last 3 Encounters:  05/01/24 129 lb (58.5 kg)  04/11/24 130 lb 6.4 oz (59.1 kg)  04/09/24 126 lb 9.6 oz (57.4 kg)    General: 63 year old female in no acute distress. Head: Normocephalic and atraumatic. Eyes: No scleral icterus. Conjunctiva pink . Ears: Normal auditory acuity. Mouth: Dentition intact. No ulcers or lesions.  Lungs: Clear throughout to auscultation. Heart: Regular rate and rhythm, no murmur. Abdomen: Soft, nontender and nondistended. No masses or hepatomegaly. Normal bowel sounds x 4 quadrants.  Rectal: No significant external hemorrhoids.  Anal opening erythematous without exudate.  Anal sphincter tone  intact, no evidence of a tight anal sphincter. No palpable mass within reach. Shell CMA present at time of exam. Musculoskeletal: Symmetrical with no gross deformities. Extremities: No edema. Neurological: Alert oriented x 4. No focal deficits.  Psychological: Alert and cooperative. Normal mood and affect  Assessment and Recommendations:  63 year old female with a history of anal cancer with rectal bleeding.  - Apply a small amount of Desitin inside the anal opening and to the external anal area three times daily as needed for anal or hemorrhoidal irritation/bleeding.  - Colonoscopy benefits and risks discussed including risk with sedation, risk of bleeding, perforation and infection  - MiraLAX  nightly, if constipation does not improve patient will require 2-day bowel prep - Continue follow-up with Dr. Lanny and Dr. Teresa  History of colon polyps. Colonoscopy in 2020 per [facility] identified 8 polyps removed (2 cecal, 5 transverse and 1 descending) and the bowel prep was listed as fair quality. She was recommended to repeat colonoscopy in 1 year. The pathology reports from the resected polyps were not sent. - Colonoscopy as ordered above  Chronic constipation, patient passes a BM once weekly.  Intermittently requires self disimpaction. - MiraLAX  nightly - If no improvement will consider trial of Linzess 145 mcg once daily  GERD - Continue GERD diet - Continue Esomeprazole  40 mg once daily and famotidine  as needed  IDA, s/p IV iron  x 2 - CBC, IBC + ferritin

## 2024-05-09 ENCOUNTER — Other Ambulatory Visit: Payer: Self-pay | Admitting: Internal Medicine

## 2024-05-15 ENCOUNTER — Telehealth: Payer: Self-pay | Admitting: Internal Medicine

## 2024-05-15 NOTE — Telephone Encounter (Signed)
*  STAT* If patient is at the pharmacy, call can be transferred to refill team.   1. Which medications need to be refilled? (please list name of each medication and dose if known) propranolol  (INDERAL ) 10 MG tablet   2. Which pharmacy/location (including street and city if local pharmacy) is medication to be sent to?  [facility] [id number] - [place], [place] - [address]    3. Do they need a 30 day or 90 day supply? 90

## 2024-05-15 NOTE — Telephone Encounter (Signed)
Refill addressed in another encounter.

## 2024-05-21 NOTE — Progress Notes (Signed)
 Agree with the assessment and plan as outlined by Elida Shawl, NP.  Would recommend 2-day bowel prep given previous fair prep quality.  Patient may also benefit from pelvic floor physical therapy if constipation not improved with osmotic laxatives or Linzess.   Ekta Dancer E. Stacia, MD [facility]enter[facility]

## 2024-05-22 ENCOUNTER — Telehealth: Payer: Self-pay | Admitting: *Deleted

## 2024-05-22 NOTE — Telephone Encounter (Signed)
 Cara Elida HERO, NP at 05/01/2024 11:00 AM  Status: Signed  Linda/Dottie:  Pls provide patient with a 2 day bowel prep thx    Stacia Glendia BRAVO, MD at 05/01/2024 11:00 AM  Status: Signed  Agree with the assessment and plan as outlined by Elida Cara, NP.  Would recommend 2-day bowel prep given previous fair prep quality.  Patient may also benefit from pelvic floor physical therapy if constipation not improved with osmotic laxatives or Linzess.     Scott E. Stacia, MD [facility]astroenterology

## 2024-05-22 NOTE — Telephone Encounter (Signed)
 Inbound call from patient requesting to speak to [facility] in regards to her prep. Patient is requesting a call back.Please advise.

## 2024-05-22 NOTE — Telephone Encounter (Signed)
 I have spoken to patient to advise that Dr Stacia would like her to complete a 2 day bowel prep rather than singular prep due to issues with constipation. Patient displays some resistance to this since she has never had to do this previously and doesn't do well if larger volumes of liquid. Patient ultimately advised this is to ensure that her colon is entirely cleaned out and to prevent us  from having to complete another colonoscopy at much shorter interval for surveillance. Updated 2 day prep instructions have been made available in mychart and patient verbalizes understanding.

## 2024-05-22 NOTE — Progress Notes (Signed)
 Linda/Dottie:  Pls provide patient with a 2 day bowel prep thx

## 2024-05-22 NOTE — Telephone Encounter (Signed)
 Patient already has miralax  on hand and wanted to confirm how many capfuls of miralax  would be equal to the 119 gram bottle suggested on prep instructions. I advised patient this would be equivilant to 7 capfuls of miralax . She verbalizes understanding.

## 2024-05-25 ENCOUNTER — Ambulatory Visit: Admitting: Gastroenterology

## 2024-05-25 ENCOUNTER — Telehealth: Payer: Self-pay | Admitting: Gastroenterology

## 2024-05-25 VITALS — BP 96/49 | HR 68 | Temp 98.1°F | Resp 10 | Ht 66.0 in | Wt 129.0 lb

## 2024-05-25 DIAGNOSIS — K625 Hemorrhage of anus and rectum: Secondary | ICD-10-CM | POA: Diagnosis not present

## 2024-05-25 DIAGNOSIS — K640 First degree hemorrhoids: Secondary | ICD-10-CM | POA: Diagnosis not present

## 2024-05-25 DIAGNOSIS — Z1211 Encounter for screening for malignant neoplasm of colon: Secondary | ICD-10-CM

## 2024-05-25 DIAGNOSIS — Z87891 Personal history of nicotine dependence: Secondary | ICD-10-CM | POA: Diagnosis not present

## 2024-05-25 DIAGNOSIS — Z860101 Personal history of adenomatous and serrated colon polyps: Secondary | ICD-10-CM

## 2024-05-25 DIAGNOSIS — K621 Rectal polyp: Secondary | ICD-10-CM

## 2024-05-25 DIAGNOSIS — K635 Polyp of colon: Secondary | ICD-10-CM

## 2024-05-25 DIAGNOSIS — Z85048 Personal history of other malignant neoplasm of rectum, rectosigmoid junction, and anus: Secondary | ICD-10-CM

## 2024-05-25 MED ORDER — SODIUM CHLORIDE 0.9 % IV SOLN: 500.0000 mL | Freq: Once | INTRAVENOUS | Status: DC

## 2024-05-25 NOTE — Progress Notes (Signed)
 Pt's states no medical or surgical changes since previsit or office visit.

## 2024-05-25 NOTE — Patient Instructions (Signed)
 Handouts given: Polyps, Hemorrhoids Resume previous diet. Continue present medications.  Repeat colonoscopy in 5 years for surveillance.  U HAD AN ENDOSCOPIC PROCEDURE TODAY AT THE [facility]:   Refer to the procedure report that was given to you for any specific questions about what was found during the examination.  If the procedure report does not answer your questions, please call your gastroenterologist to clarify.  If you requested that your care partner not be given the details of your procedure findings, then the procedure report has been included in a sealed envelope for you to review at your convenience later.  YOU SHOULD EXPECT: Some feelings of bloating in the abdomen. Passage of more gas than usual.  Walking can help get rid of the air that was put into your GI tract during the procedure and reduce the bloating. If you had a lower endoscopy (such as a colonoscopy or flexible sigmoidoscopy) you may notice spotting of blood in your stool or on the toilet paper. If you underwent a bowel prep for your procedure, you may not have a normal bowel movement for a few days.  Please Note:  You might notice some irritation and congestion in your nose or some drainage.  This is from the oxygen used during your procedure.  There is no need for concern and it should clear up in a day or so.  SYMPTOMS TO REPORT IMMEDIATELY:  Following lower endoscopy (colonoscopy or flexible sigmoidoscopy):  Excessive amounts of blood in the stool  Significant tenderness or worsening of abdominal pains  Swelling of the abdomen that is new, acute  Fever of 100F or higher  For urgent or emergent issues, a gastroenterologist can be reached at any hour by calling [phone]. Do not use MyChart messaging for urgent concerns.    DIET:  We do recommend a small meal at first, but then you may proceed to your regular diet.  Drink plenty of fluids but you should avoid alcoholic beverages for 24  hours.  ACTIVITY:  You should plan to take it easy for the rest of today and you should NOT DRIVE or use heavy machinery until tomorrow (because of the sedation medicines used during the test).    FOLLOW UP: Our staff will call the number listed on your records the next business day following your procedure.  We will call around 7:15- 8:00 am to check on you and address any questions or concerns that you may have regarding the information given to you following your procedure. If we do not reach you, we will leave a message.     If any biopsies were taken you will be contacted by phone or by letter within the next 1-3 weeks.  Please call us  at [phone] if you have not heard about the biopsies in 3 weeks.    SIGNATURES/CONFIDENTIALITY: You and/or your care partner have signed paperwork which will be entered into your electronic medical record.  These signatures attest to the fact that that the information above on your After Visit Summary has been reviewed and is understood.  Full responsibility of the confidentiality of this discharge information lies with you and/or your care-partner.

## 2024-05-25 NOTE — Progress Notes (Signed)
 Vss nad trans to pacu

## 2024-05-25 NOTE — Op Note (Signed)
 [facility] Patient Name: Melinda [name] Procedure Date: 05/25/2024 2:28 PM MRN: [id number] Endoscopist: Glendia E. Stacia , MD, [id number] Age: 63 Referring MD:  Date of Birth: [birth year] Gender: Female Account #: [id number] Procedure:                Colonoscopy Indications:              Surveillance: Personal history of adenomatous                            polyps on last colonoscopy 5 years ago Medicines:                Monitored Anesthesia Care Procedure:                Pre-Anesthesia Assessment:                           - Prior to the procedure, a History and Physical                            was performed, and patient medications and                            allergies were reviewed. The patient's tolerance of                            previous anesthesia was also reviewed. The risks                            and benefits of the procedure and the sedation                            options and risks were discussed with the patient.                            All questions were answered, and informed consent                            was obtained. Prior Anticoagulants: The patient has                            taken no anticoagulant or antiplatelet agents. ASA                            Grade Assessment: III - A patient with severe                            systemic disease. After reviewing the risks and                            benefits, the patient was deemed in satisfactory                            condition to undergo the procedure.  After obtaining informed consent, the colonoscope                            was passed under direct vision. Throughout the                            procedure, the patient's blood pressure, pulse, and                            oxygen saturations were monitored continuously. The                            Olympus Scope SN [id number] was introduced through the                            anus and  advanced to the the terminal ileum, with                            identification of the appendiceal orifice and IC                            valve. The colonoscopy was performed without                            difficulty. The patient tolerated the procedure                            well. The quality of the bowel preparation was                            excellent. The terminal ileum, ileocecal valve,                            appendiceal orifice, and rectum were photographed.                            The bowel preparation used was SUPREP via split                            dose instruction. Scope In: 2:44:08 PM Scope Out: 2:58:27 PM Scope Withdrawal Time: 0 hours 9 minutes 14 seconds  Total Procedure Duration: 0 hours 14 minutes 19 seconds  Findings:                 The perianal and digital rectal examinations were                            normal. Pertinent negatives include normal                            sphincter tone and no palpable rectal lesions.                           Multiple hyperplastic polyps were found in the  rectum and sigmoid colon. The polyps were                            diminutive in size.                           The exam was otherwise normal throughout the                            examined colon.                           The terminal ileum appeared normal.                           Non-bleeding internal hemorrhoids were found during                            retroflexion. The hemorrhoids were Grade I                            (internal hemorrhoids that do not prolapse).                           No additional abnormalities were found on                            retroflexion. Complications:            No immediate complications. Estimated Blood Loss:     Estimated blood loss: none. Impression:               - Multiple diminutive polyps in the rectum and in                            the sigmoid colon. These are  consistent with                            hyperplastic polyps                           - The examined portion of the ileum was normal.                           - Non-bleeding internal hemorrhoids.                           - No specimens collected. Recommendation:           - Patient has a contact number available for                            emergencies. The signs and symptoms of potential                            delayed complications were discussed with the  patient. Return to normal activities tomorrow.                            Written discharge instructions were provided to the                            patient.                           - Resume previous diet.                           - Continue present medications.                           - Repeat colonoscopy in 5 years for surveillance. Jamaury Gumz E. Stacia, MD 05/25/2024 3:04:30 PM This report has been signed electronically.

## 2024-05-25 NOTE — Telephone Encounter (Signed)
 Returned pts call.  She states that she had miralax  a week prior to yesterday and then did the suprep last night.  She states that her stools are clear liquid coming out.  I advised her to at least drink half of this mornings dose of suprep to make sure her colon is completely clean.  Pt verbalized understanding.

## 2024-05-25 NOTE — Progress Notes (Signed)
 History and Physical Interval Note:  05/25/2024 2:33 PM  Melinda [name]  has presented today for endoscopic procedure(s), with the diagnosis of  Encounter Diagnoses  Name Primary?   Rectal bleeding Yes   History of anal cancer   .  The various methods of evaluation and treatment have been discussed with the patient and/or family. After consideration of risks, benefits and other options for treatment, the patient has consented to  the endoscopic procedure(s).   The patient's history has been reviewed, patient examined, no change in status, stable for endoscopic procedure(s).  I have reviewed the patient's chart and labs.  Questions were answered to the patient's satisfaction.     Bassel Gaskill E. Stacia, MD [facility] Gastroenterology

## 2024-05-25 NOTE — Telephone Encounter (Signed)
 Inbound call from patient stating she has a colonoscopy scheduled for this afternoon at 2pm and is supposed to take second half of her prep this morning at 9am but did a 2 day cleanse and everything coming out is clear, patient would like to speak to nurse in regards to if she has to finish her prep this morning Requesting a call back  Please advise  Thank you

## 2024-05-28 ENCOUNTER — Telehealth: Payer: Self-pay | Admitting: Lactation Services

## 2024-05-28 NOTE — Telephone Encounter (Signed)
" °  Follow up Call-     05/25/2024    2:14 PM  Call back number  Post procedure Call Back phone  # [phone]  Permission to leave phone message Yes     Patient questions:  Do you have a fever, pain , or abdominal swelling? No. Pain Score  0 *  Have you tolerated food without any problems? Yes.    Have you been able to return to your normal activities? Yes.    Do you have any questions about your discharge instructions: Diet   No. Medications  No. Follow up visit  No.  Do you have questions or concerns about your Care? No.  Actions: * If pain score is 4 or above: No action needed, pain <4.   "

## 2024-08-21 ENCOUNTER — Ambulatory Visit: Admitting: Internal Medicine
